# Patient Record
Sex: Female | Born: 1945 | Race: White | Hispanic: No | Marital: Married | Smoking: Former smoker
Health system: Southern US, Community
[De-identification: ages and names within clinical notes are randomized; demographics above are authoritative.]

## PROBLEM LIST (undated history)

## (undated) DIAGNOSIS — F32A Depression, unspecified: Secondary | ICD-10-CM

## (undated) DIAGNOSIS — I739 Peripheral vascular disease, unspecified: Secondary | ICD-10-CM

## (undated) DIAGNOSIS — T8859XA Other complications of anesthesia, initial encounter: Secondary | ICD-10-CM

## (undated) DIAGNOSIS — F419 Anxiety disorder, unspecified: Secondary | ICD-10-CM

## (undated) DIAGNOSIS — G971 Other reaction to spinal and lumbar puncture: Secondary | ICD-10-CM

## (undated) DIAGNOSIS — J45909 Unspecified asthma, uncomplicated: Secondary | ICD-10-CM

## (undated) DIAGNOSIS — R0602 Shortness of breath: Secondary | ICD-10-CM

## (undated) DIAGNOSIS — M47816 Spondylosis without myelopathy or radiculopathy, lumbar region: Secondary | ICD-10-CM

## (undated) DIAGNOSIS — N189 Chronic kidney disease, unspecified: Secondary | ICD-10-CM

## (undated) DIAGNOSIS — R059 Cough, unspecified: Secondary | ICD-10-CM

## (undated) DIAGNOSIS — M797 Fibromyalgia: Secondary | ICD-10-CM

## (undated) DIAGNOSIS — C801 Malignant (primary) neoplasm, unspecified: Secondary | ICD-10-CM

## (undated) DIAGNOSIS — D649 Anemia, unspecified: Secondary | ICD-10-CM

## (undated) DIAGNOSIS — T85192A Other mechanical complication of implanted electronic neurostimulator (electrode) of spinal cord, initial encounter: Secondary | ICD-10-CM

## (undated) DIAGNOSIS — J449 Chronic obstructive pulmonary disease, unspecified: Secondary | ICD-10-CM

## (undated) DIAGNOSIS — I82409 Acute embolism and thrombosis of unspecified deep veins of unspecified lower extremity: Secondary | ICD-10-CM

## (undated) DIAGNOSIS — I82402 Acute embolism and thrombosis of unspecified deep veins of left lower extremity: Secondary | ICD-10-CM

## (undated) DIAGNOSIS — F329 Major depressive disorder, single episode, unspecified: Secondary | ICD-10-CM

## (undated) DIAGNOSIS — R05 Cough: Secondary | ICD-10-CM

## (undated) DIAGNOSIS — I890 Lymphedema, not elsewhere classified: Secondary | ICD-10-CM

## (undated) DIAGNOSIS — K219 Gastro-esophageal reflux disease without esophagitis: Secondary | ICD-10-CM

## (undated) DIAGNOSIS — Z9889 Other specified postprocedural states: Secondary | ICD-10-CM

## (undated) DIAGNOSIS — J189 Pneumonia, unspecified organism: Secondary | ICD-10-CM

## (undated) DIAGNOSIS — R112 Nausea with vomiting, unspecified: Secondary | ICD-10-CM

## (undated) DIAGNOSIS — R7303 Prediabetes: Secondary | ICD-10-CM

## (undated) DIAGNOSIS — J383 Other diseases of vocal cords: Secondary | ICD-10-CM

## (undated) DIAGNOSIS — F319 Bipolar disorder, unspecified: Secondary | ICD-10-CM

## (undated) DIAGNOSIS — Z8489 Family history of other specified conditions: Secondary | ICD-10-CM

## (undated) DIAGNOSIS — R42 Dizziness and giddiness: Secondary | ICD-10-CM

## (undated) HISTORY — DX: Acute embolism and thrombosis of unspecified deep veins of left lower extremity: I82.402

## (undated) HISTORY — PX: APPENDECTOMY: SHX54

## (undated) HISTORY — PX: FINGER ARTHROSCOPY WITH CARPOMETACARPEL (CMC) ARTHROPLASTY: SHX5629

## (undated) HISTORY — PX: WISDOM TOOTH EXTRACTION: SHX21

## (undated) HISTORY — PX: SHOULDER ARTHROSCOPY: SHX128

## (undated) HISTORY — PX: DENTAL RESTORATION/EXTRACTION WITH X-RAY: SHX5796

## (undated) HISTORY — DX: Gastro-esophageal reflux disease without esophagitis: K21.9

## (undated) HISTORY — DX: Fibromyalgia: M79.7

## (undated) HISTORY — DX: Cough: R05

## (undated) HISTORY — PX: BACK SURGERY: SHX140

## (undated) HISTORY — DX: Cough, unspecified: R05.9

## (undated) HISTORY — DX: Spondylosis without myelopathy or radiculopathy, lumbar region: M47.816

## (undated) HISTORY — DX: Bipolar disorder, unspecified: F31.9

## (undated) HISTORY — PX: TARSAL SUSPENSION: SHX2483

## (undated) HISTORY — PX: OTHER SURGICAL HISTORY: SHX169

## (undated) HISTORY — PX: COLONOSCOPY W/ BIOPSIES AND POLYPECTOMY: SHX1376

## (undated) HISTORY — PX: UPPER GI ENDOSCOPY: SHX6162

## (undated) HISTORY — PX: CATARACT EXTRACTION W/ INTRAOCULAR LENS  IMPLANT, BILATERAL: SHX1307

## (undated) HISTORY — PX: BLEPHAROPLASTY: SUR158

---
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
## 1978-02-08 HISTORY — PX: ABDOMINAL HYSTERECTOMY: SHX81

## 1978-02-08 HISTORY — PX: PARTIAL HYSTERECTOMY: SHX80

## 1987-02-09 HISTORY — PX: BREAST ENHANCEMENT SURGERY: SHX7

## 1987-02-09 HISTORY — PX: CHOLECYSTECTOMY: SHX55

## 1997-06-10 ENCOUNTER — Other Ambulatory Visit: Admission: RE | Admit: 1997-06-10 | Discharge: 1997-06-10 | Payer: Self-pay | Admitting: Obstetrics and Gynecology

## 1998-02-27 ENCOUNTER — Encounter: Payer: Self-pay | Admitting: Emergency Medicine

## 1998-08-27 ENCOUNTER — Encounter: Admission: RE | Admit: 1998-08-27 | Discharge: 1998-09-08 | Payer: Self-pay | Admitting: Anesthesiology

## 1998-09-10 ENCOUNTER — Encounter: Admission: RE | Admit: 1998-09-10 | Discharge: 1998-12-09 | Payer: Self-pay | Admitting: Anesthesiology

## 1999-06-02 ENCOUNTER — Other Ambulatory Visit: Admission: RE | Admit: 1999-06-02 | Discharge: 1999-06-02 | Payer: Self-pay | Admitting: Obstetrics and Gynecology

## 1999-08-05 ENCOUNTER — Encounter: Admission: RE | Admit: 1999-08-05 | Discharge: 1999-11-03 | Payer: Self-pay | Admitting: Anesthesiology

## 2000-05-12 ENCOUNTER — Encounter: Admission: RE | Admit: 2000-05-12 | Discharge: 2000-05-12 | Payer: Self-pay | Admitting: Orthopedic Surgery

## 2000-05-12 ENCOUNTER — Encounter: Payer: Self-pay | Admitting: Orthopedic Surgery

## 2000-08-18 ENCOUNTER — Encounter: Admission: RE | Admit: 2000-08-18 | Discharge: 2000-10-08 | Payer: Self-pay | Admitting: Anesthesiology

## 2000-08-22 ENCOUNTER — Other Ambulatory Visit: Admission: RE | Admit: 2000-08-22 | Discharge: 2000-08-22 | Payer: Self-pay | Admitting: Obstetrics and Gynecology

## 2000-11-08 ENCOUNTER — Encounter: Payer: Self-pay | Admitting: Neurosurgery

## 2000-11-08 ENCOUNTER — Inpatient Hospital Stay (HOSPITAL_COMMUNITY): Admission: RE | Admit: 2000-11-08 | Discharge: 2000-11-08 | Payer: Self-pay | Admitting: Neurosurgery

## 2001-07-31 ENCOUNTER — Ambulatory Visit (HOSPITAL_COMMUNITY): Admission: RE | Admit: 2001-07-31 | Discharge: 2001-07-31 | Payer: Self-pay | Admitting: Gastroenterology

## 2002-11-08 ENCOUNTER — Encounter: Payer: Self-pay | Admitting: Orthopedic Surgery

## 2002-11-08 ENCOUNTER — Encounter: Admission: RE | Admit: 2002-11-08 | Discharge: 2002-11-08 | Payer: Self-pay | Admitting: Orthopedic Surgery

## 2003-02-04 ENCOUNTER — Encounter: Admission: RE | Admit: 2003-02-04 | Discharge: 2003-02-04 | Payer: Self-pay | Admitting: Cardiology

## 2003-02-09 HISTORY — PX: CERVICAL FUSION: SHX112

## 2003-02-15 ENCOUNTER — Encounter: Admission: RE | Admit: 2003-02-15 | Discharge: 2003-02-15 | Payer: Self-pay | Admitting: Neurosurgery

## 2003-03-18 ENCOUNTER — Inpatient Hospital Stay (HOSPITAL_COMMUNITY): Admission: AD | Admit: 2003-03-18 | Discharge: 2003-03-19 | Payer: Self-pay | Admitting: Neurosurgery

## 2003-05-23 ENCOUNTER — Encounter: Admission: RE | Admit: 2003-05-23 | Discharge: 2003-05-23 | Payer: Self-pay | Admitting: Neurosurgery

## 2004-02-09 HISTORY — PX: CARPAL TUNNEL RELEASE: SHX101

## 2006-06-13 ENCOUNTER — Encounter: Admission: RE | Admit: 2006-06-13 | Discharge: 2006-06-13 | Payer: Self-pay | Admitting: Orthopedic Surgery

## 2007-11-27 ENCOUNTER — Ambulatory Visit (HOSPITAL_COMMUNITY): Admission: RE | Admit: 2007-11-27 | Discharge: 2007-11-27 | Payer: Self-pay | Admitting: Anesthesiology

## 2008-01-20 ENCOUNTER — Encounter: Admission: RE | Admit: 2008-01-20 | Discharge: 2008-01-20 | Payer: Self-pay | Admitting: Neurosurgery

## 2008-02-09 DIAGNOSIS — I82409 Acute embolism and thrombosis of unspecified deep veins of unspecified lower extremity: Secondary | ICD-10-CM

## 2008-02-09 DIAGNOSIS — I82402 Acute embolism and thrombosis of unspecified deep veins of left lower extremity: Secondary | ICD-10-CM

## 2008-02-09 HISTORY — DX: Acute embolism and thrombosis of unspecified deep veins of left lower extremity: I82.402

## 2008-02-09 HISTORY — DX: Acute embolism and thrombosis of unspecified deep veins of unspecified lower extremity: I82.409

## 2008-02-09 HISTORY — PX: SPINAL CORD STIMULATOR IMPLANT: SHX2422

## 2008-06-14 ENCOUNTER — Ambulatory Visit: Payer: Self-pay | Admitting: Vascular Surgery

## 2008-07-04 ENCOUNTER — Ambulatory Visit: Payer: Self-pay | Admitting: Diagnostic Radiology

## 2008-07-04 ENCOUNTER — Emergency Department (HOSPITAL_BASED_OUTPATIENT_CLINIC_OR_DEPARTMENT_OTHER): Admission: EM | Admit: 2008-07-04 | Discharge: 2008-07-04 | Payer: Self-pay | Admitting: Emergency Medicine

## 2008-10-01 ENCOUNTER — Encounter: Admission: RE | Admit: 2008-10-01 | Discharge: 2008-10-01 | Payer: Self-pay | Admitting: Gastroenterology

## 2009-07-03 ENCOUNTER — Encounter: Payer: Self-pay | Admitting: Emergency Medicine

## 2009-07-11 ENCOUNTER — Encounter: Admission: RE | Admit: 2009-07-11 | Discharge: 2009-07-11 | Payer: Self-pay | Admitting: Neurology

## 2009-07-15 ENCOUNTER — Encounter: Payer: Self-pay | Admitting: Emergency Medicine

## 2009-07-18 ENCOUNTER — Encounter: Admission: RE | Admit: 2009-07-18 | Discharge: 2009-07-18 | Payer: Self-pay | Admitting: Neurology

## 2009-07-28 DIAGNOSIS — G43909 Migraine, unspecified, not intractable, without status migrainosus: Secondary | ICD-10-CM

## 2009-07-28 DIAGNOSIS — J383 Other diseases of vocal cords: Secondary | ICD-10-CM

## 2009-07-28 DIAGNOSIS — IMO0001 Reserved for inherently not codable concepts without codable children: Secondary | ICD-10-CM

## 2009-07-28 DIAGNOSIS — F319 Bipolar disorder, unspecified: Secondary | ICD-10-CM | POA: Insufficient documentation

## 2009-07-28 DIAGNOSIS — K219 Gastro-esophageal reflux disease without esophagitis: Secondary | ICD-10-CM | POA: Insufficient documentation

## 2009-07-29 ENCOUNTER — Ambulatory Visit: Payer: Self-pay | Admitting: Emergency Medicine

## 2009-07-29 DIAGNOSIS — J309 Allergic rhinitis, unspecified: Secondary | ICD-10-CM

## 2009-07-30 ENCOUNTER — Telehealth (INDEPENDENT_AMBULATORY_CARE_PROVIDER_SITE_OTHER): Payer: Self-pay | Admitting: *Deleted

## 2009-08-09 ENCOUNTER — Emergency Department (HOSPITAL_COMMUNITY): Admission: EM | Admit: 2009-08-09 | Discharge: 2009-08-09 | Payer: Self-pay | Admitting: Emergency Medicine

## 2009-08-26 ENCOUNTER — Ambulatory Visit: Payer: Self-pay | Admitting: Emergency Medicine

## 2009-08-26 ENCOUNTER — Telehealth: Payer: Self-pay | Admitting: Emergency Medicine

## 2009-08-28 ENCOUNTER — Encounter: Payer: Self-pay | Admitting: Emergency Medicine

## 2009-09-03 ENCOUNTER — Telehealth: Payer: Self-pay | Admitting: Emergency Medicine

## 2009-09-10 ENCOUNTER — Encounter: Admission: RE | Admit: 2009-09-10 | Discharge: 2009-11-04 | Payer: Self-pay | Admitting: Neurology

## 2009-09-30 ENCOUNTER — Ambulatory Visit: Payer: Self-pay | Admitting: Internal Medicine

## 2009-09-30 DIAGNOSIS — R930 Abnormal findings on diagnostic imaging of skull and head, not elsewhere classified: Secondary | ICD-10-CM

## 2009-10-01 ENCOUNTER — Encounter: Admission: RE | Admit: 2009-10-01 | Discharge: 2009-10-01 | Payer: Self-pay | Admitting: Orthopedic Surgery

## 2009-10-06 ENCOUNTER — Ambulatory Visit: Payer: Self-pay | Admitting: Cardiology

## 2009-10-06 LAB — CONVERTED CEMR LAB
Basophils Absolute: 0.1 10*3/uL (ref 0.0–0.1)
Basophils Relative: 0.9 % (ref 0.0–3.0)
Calcium: 8.6 mg/dL (ref 8.4–10.5)
Eosinophils Relative: 1 % (ref 0.0–5.0)
GFR calc non Af Amer: 58.92 mL/min (ref >60)
Hemoglobin: 12.9 g/dL (ref 12.0–15.0)
Lymphocytes Relative: 21.8 % (ref 12.0–46.0)
Monocytes Relative: 6.3 % (ref 3.0–12.0)
Neutro Abs: 10.4 10*3/uL — ABNORMAL HIGH (ref 1.4–7.7)
RBC: 4.26 M/uL (ref 3.87–5.11)
RDW: 13.9 % (ref 11.5–14.6)
Sodium: 145 meq/L (ref 135–145)
WBC: 14.9 10*3/uL — ABNORMAL HIGH (ref 4.5–10.5)

## 2009-10-07 ENCOUNTER — Ambulatory Visit: Payer: Self-pay | Admitting: Emergency Medicine

## 2009-10-07 DIAGNOSIS — R911 Solitary pulmonary nodule: Secondary | ICD-10-CM | POA: Insufficient documentation

## 2009-10-09 ENCOUNTER — Telehealth: Payer: Self-pay | Admitting: Internal Medicine

## 2009-10-14 ENCOUNTER — Ambulatory Visit
Admission: RE | Admit: 2009-10-14 | Discharge: 2009-10-14 | Payer: Self-pay | Source: Home / Self Care | Admitting: Emergency Medicine

## 2009-10-14 ENCOUNTER — Ambulatory Visit: Payer: Self-pay | Admitting: Emergency Medicine

## 2009-10-27 ENCOUNTER — Emergency Department (HOSPITAL_BASED_OUTPATIENT_CLINIC_OR_DEPARTMENT_OTHER)
Admission: EM | Admit: 2009-10-27 | Discharge: 2009-10-27 | Payer: Self-pay | Source: Home / Self Care | Admitting: Emergency Medicine

## 2009-10-27 ENCOUNTER — Ambulatory Visit: Payer: Self-pay | Admitting: Diagnostic Radiology

## 2009-10-30 ENCOUNTER — Ambulatory Visit: Payer: Self-pay | Admitting: Internal Medicine

## 2009-11-01 LAB — CONVERTED CEMR LAB
BUN: 14 mg/dL (ref 6–23)
Basophils Relative: 0.4 % (ref 0.0–3.0)
CO2: 27 meq/L (ref 19–32)
Chloride: 106 meq/L (ref 96–112)
Creatinine, Ser: 0.9 mg/dL (ref 0.4–1.2)
Eosinophils Absolute: 0.3 10*3/uL (ref 0.0–0.7)
Hemoglobin: 12.3 g/dL (ref 12.0–15.0)
Lymphocytes Relative: 24.2 % (ref 12.0–46.0)
MCHC: 34.3 g/dL (ref 30.0–36.0)
Neutro Abs: 5.1 10*3/uL (ref 1.4–7.7)
RBC: 4.08 M/uL (ref 3.87–5.11)

## 2009-11-04 ENCOUNTER — Ambulatory Visit: Payer: Self-pay | Admitting: Emergency Medicine

## 2009-11-05 ENCOUNTER — Encounter: Admission: RE | Admit: 2009-11-05 | Discharge: 2009-11-05 | Payer: Self-pay | Admitting: Orthopedic Surgery

## 2009-11-05 ENCOUNTER — Telehealth (INDEPENDENT_AMBULATORY_CARE_PROVIDER_SITE_OTHER): Payer: Self-pay | Admitting: *Deleted

## 2009-11-14 ENCOUNTER — Telehealth (INDEPENDENT_AMBULATORY_CARE_PROVIDER_SITE_OTHER): Payer: Self-pay | Admitting: *Deleted

## 2009-11-16 ENCOUNTER — Encounter: Payer: Self-pay | Admitting: Emergency Medicine

## 2009-11-18 ENCOUNTER — Encounter: Payer: Self-pay | Admitting: Emergency Medicine

## 2009-11-19 ENCOUNTER — Encounter: Payer: Self-pay | Admitting: Emergency Medicine

## 2009-12-03 ENCOUNTER — Ambulatory Visit: Payer: Self-pay | Admitting: Emergency Medicine

## 2009-12-08 ENCOUNTER — Telehealth (INDEPENDENT_AMBULATORY_CARE_PROVIDER_SITE_OTHER): Payer: Self-pay | Admitting: *Deleted

## 2009-12-09 ENCOUNTER — Encounter: Admission: RE | Admit: 2009-12-09 | Discharge: 2009-12-09 | Payer: Self-pay | Admitting: Neurosurgery

## 2009-12-17 ENCOUNTER — Encounter: Payer: Self-pay | Admitting: Emergency Medicine

## 2009-12-17 ENCOUNTER — Ambulatory Visit: Payer: Self-pay | Admitting: Emergency Medicine

## 2009-12-25 ENCOUNTER — Ambulatory Visit: Payer: Self-pay | Admitting: Emergency Medicine

## 2010-01-21 ENCOUNTER — Ambulatory Visit (HOSPITAL_COMMUNITY)
Admission: RE | Admit: 2010-01-21 | Discharge: 2010-01-21 | Payer: Self-pay | Source: Home / Self Care | Attending: Neurosurgery | Admitting: Neurosurgery

## 2010-01-30 ENCOUNTER — Telehealth: Payer: Self-pay | Admitting: Internal Medicine

## 2010-02-03 ENCOUNTER — Inpatient Hospital Stay (HOSPITAL_COMMUNITY)
Admission: RE | Admit: 2010-02-03 | Discharge: 2010-02-05 | Payer: Self-pay | Source: Home / Self Care | Attending: Neurosurgery | Admitting: Neurosurgery

## 2010-03-05 ENCOUNTER — Other Ambulatory Visit: Payer: Self-pay | Admitting: Neurosurgery

## 2010-03-05 DIAGNOSIS — Z09 Encounter for follow-up examination after completed treatment for conditions other than malignant neoplasm: Secondary | ICD-10-CM

## 2010-03-10 NOTE — Miscellaneous (Signed)
Summary: Orders Update pft charges  Clinical Lists Changes  Orders: Added new Service order of Carbon Monoxide diffusing w/capacity (94720) - Signed Added new Service order of Lung Volumes (94240) - Signed Added new Service order of Spirometry (Pre & Post) (94060) - Signed 

## 2010-03-10 NOTE — Assessment & Plan Note (Signed)
Summary: cough, allergies, GERD   Visit Type:  Follow-up Copy to:  Dr. Stevphen Rochester Primary Provider/Referring Provider:  Dr. Joycelyn Rua  CC:  The patient is here to discuss PFT results...she says her cough has returned.  History of Present Illness: 65 yo former smoker, hx documented allergies and rhinitis, chronic cough. Has seen Dr Corinda Gubler regarding allergies and the cough. For the last month the cough has been much worse, happens with talking and exertion. She has been empirically treated with by mouth steroids and also started on inhaled LABA/ICS. Skin testing has shown no sensitivity to cats/dogs although she has 30 animals in her home. Spirometry done 07/03/09 showed normal airflows. She has also been treated for GERD with Nexium + prilosec.   ROV 11/04/09 -- returns for cough. Her HSP panel is negative for fungal sensitivity, IgE and eosinophils normal. Her cough persists, may be a bit better. Able to carry on more conversation, talking does make it worse. Dr Maple Hudson rightly raises possibility of auto-immune process, even BAC.   ROV 12/03/09 -- chronic cough, chronic rhinitis, normal AF on spirometry. She was admitted 10/9-10/12 to [facility] with fever, lightheadedness, dyspnea, cough was actually better. Treated for ? COPD exac or PNA. CT scan without infiltrates (per d/c summary). Cleda Daub 5/11 normal. She believes that the [facility] helps her. Her cough is better. 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   ROV 12/25/09 -- chronic cough, rhinitis, abnormal CT scan (? mild nodular infiltrate, 09/2009). Repeat scan done at [facility] in 10/11, need to review and compare. Initial plan has been to repeat scan in Dec. Has had PFT since last visit, here to review. 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 Her exposure profile at home is very high - dusts, litter etc. She is on [place], sometimes coughs after it. She believes that the [facility] is  helping her breathing.   Current Medications (verified): 1)  Budeprion Sr 150 Mg Xr12h-Tab (Bupropion Hcl) .Marland Kitchen.. 1 By Mouth Three Times A Day 2)  Ritalin 20 Mg Tabs (Methylphenidate Hcl) .Marland Kitchen.. 1 By Mouth Two Times A Day 3)  Furosemide 40 Mg Tabs (Furosemide) .Marland Kitchen.. 1 By Mouth Three Times A Day 4)  Nexium 40 Mg Cpdr (Esomeprazole Magnesium) .Marland Kitchen.. 1 By Mouth Every Morning 5)  Prilosec Otc 20 Mg Tbec (Omeprazole Magnesium) .Marland Kitchen.. 1 By Mouth At Night 6)  Multivitamins  Tabs (Multiple Vitamin) .Marland Kitchen.. 1 By Mouth Daily 7)  Potassium Gluconate 595 Mg Tabs (Potassium Gluconate) .Marland Kitchen.. 1 By Mouth Two Times A Day 8)  Lovastatin 40 Mg Tabs (Lovastatin) .... Take 1 By Mouth Once Daily 9)  Cyclobenzaprine Hcl 10 Mg Tabs (Cyclobenzaprine Hcl) .... 2 By Mouth At Bedtime 10)  Lamictal 100 Mg Tabs (Lamotrigine) .Marland Kitchen.. 1 By Mouth At Bedtime 11)  Alprazolam 1 Mg Tabs (Alprazolam) .... 2 By Mouth At Bedtime;take 1 As Needed During Day 12)  Mirapex 0.5 Mg Tabs (Pramipexole Dihydrochloride) .Marland Kitchen.. 1 By Mouth At Bedtime 13)  Lyrica 100 Mg Caps (Pregabalin) .Marland Kitchen.. 1 By Mouth At Bedtime 14)  Ibuprofen 200 Mg Caps (Ibuprofen) .... As Needed 15)  Fioricet 50-325-40 Mg Tabs (Butalbital-Apap-Caffeine) .... 2 By Mouth As Needed For Migraines 16)  Antihistamine Decongestant 2.5-60 Mg Tabs (Triprolidine-Pseudoephedrine) .... As Needed 17)  Melatonin 300 Mcg Tabs (Melatonin) .Marland Kitchen.. 1 By Mouth At Bedtime 18)  Zonisamide 100 Mg Caps (Zonisamide) .... 2 By Mouth As Directed 19)  Hydrocodone-Acetaminophen 5-325 Mg  Tabs (Hydrocodone-Acetaminophen) .... One Tablet Every 12 Hours 20)  Promethazine Hcl 25 Mg Tabs (Promethazine Hcl) .... As Needed 21)  Loratadine 10 Mg Tabs (Loratadine) .Marland Kitchen.. 1 By Mouth Once Daily 22)  Fluticasone Propionate 50 Mcg/act Susp (Fluticasone Propionate) .... 2 Sprays Each Nostril Two Times A Day 23)  Dulera 100-5 Mcg/act Aero (Mometasone Furo-Formoterol Fum) .... 2 Puffs Two Times A Day 24)  Vitamin D 1000 Unit Tabs  (Cholecalciferol) .Marland Kitchen.. 1 By Mouth Daily 25)  Calcium 600 Mg Tabs (Calcium) .... 2 By Mouth Daily 26)  Delsym 30 Mg/60ml Lqcr (Dextromethorphan Polistirex) .... As Directed 27)  Coq10 100 Mg Caps (Coenzyme Q10) .... Once Daily 28)  Abilify 10 Mg Tabs (Aripiprazole) .... Once Daily 29)  Bepreve 1.5 % Soln (Bepotastine Besilate) .... As Directed Daily 30)  Docusate Calcium 240 Mg Caps (Docusate Calcium) .... Take 1 By Mouth Weekly 31)  Sinus Rinse Kit  Pack (Hypertonic Nasal Wash) .Marland Kitchen.. 1 Time Daily 32)  Fluorometholone 0.1 % Susp (Fluorometholone) .Marland Kitchen.. 1 Drop in Each Eye Two Times A Day As Needed 33)  Pennsaid 1.5 % Soln (Diclofenac Sodium) .Marland Kitchen.. 1 Time A Day 34)  Proair Hfa 108 (90 Base) Mcg/act Aers (Albuterol Sulfate) .... 2 Puffs Every 4 Hours As Needed 35)  Docusate Sodium 100 Mg Tabs (Docusate Sodium) .... 2 Capsules Daily 36)  Meclizine Hcl 25 Mg Tabs (Meclizine Hcl) .... As Needed  Allergies (verified): 1)  ! * Lithium  Vital Signs:  Patient profile:   65 year old female Height:      64 inches (162.56 cm) Weight:      232 pounds (105.45 kg) BMI:     39.97 O2 Sat:      98 % on Room air Temp:     98.0 degrees F (36.67 degrees C) oral Pulse rate:   95 / minute BP sitting:   130 / 72  (left arm) Cuff size:   large  Vitals Entered By: Michel Bickers CMA (December 25, 2009 11:46 AM)  O2 Sat at Rest %:  98 O2 Flow:  Room air CC: The patient is here to discuss PFT results...she says her cough has returned Comments Medications reviewed with patient Michel Bickers [facility]  December 25, 2009 11:47 AM   Physical Exam  General:  normal appearance, healthy appearing, and obese. Cough is much better Head:  normocephalic and atraumatic Eyes:  conjunctiva and sclera clear Nose:  no deformity, discharge, inflammation, or lesions Mouth:  posterior pharyngeal erythema Neck:  no masses, thyromegaly, or abnormal cervical nodes Lungs:  clear bilaterally to auscultation and percussion Heart:  regular  rate and rhythm, S1, S2 without murmurs, rubs, gallops, or clicks Abdomen:  not examined Extremities:  1 + LE edema Neurologic:  non-focal Skin:  intact without lesions or rashes Psych:  alert and cooperative; normal mood and affect; normal attention span and concentration   CT of Chest  Procedure date:  11/18/2009  Findings:      Shows some persistent RLL mild peribronchial inflammatory change but in DIFFERENT distribution compared with our CT done 8/29/1.    Impression & Recommendations:  Problem # 1:  COUGH (ICD-786.2)  Not clear that she actually has asthma, her spiro and PFt may suggest mild AFL but soft call.  - continure NSW, loratadine, fluticasone - stay on [facility] for now.   Orders: Est. Patient Level IV (16109)  Problem # 2:  ABNORMAL CHEST XRAY (ICD-793.1) CT from [facility] 10/11 shows possible continued mild RLL inflammatory  changes but in doifferent distribution compared with our scan from 09/2009. This rules out BAC or other malignancy. While [facility] or other inflammatory process remain a possibility, this seems unlikely given th erest of her clinical picture. FOB was negative. Will plan to follow clinically and decide regarding further imaging based on ststus.   Orders: Est. Patient Level IV (56213)  Problem # 3:  G E R D (ICD-530.81)  Her updated medication list for this problem includes:    Nexium 40 Mg Cpdr (Esomeprazole magnesium) .Marland Kitchen... 1 by mouth every morning    Prilosec Otc 20 Mg Tbec (Omeprazole magnesium) .Marland Kitchen... 1 by mouth at night  Patient Instructions: 1)  Please continue your current medications 2)  We will cancel your CT scan scheduled for December

## 2010-03-10 NOTE — Assessment & Plan Note (Signed)
Summary: allergy consult/mhh   Vital Signs:  Patient profile:   65 year old female Height:      64 inches Weight:      222.13 pounds BMI:     38.27 O2 Sat:      96 % on Room air Pulse rate:   78 / minute BP sitting:   138 / 72  (left arm) Cuff size:   regular  Vitals Entered By: Reynaldo Minium CMA (September 30, 2009 4:06 PM)  O2 Flow:  Room air   Copy to:  Dr. Stevphen Rochester Primary Provider/Referring Provider:  Dr. Joycelyn Rua   History of Present Illness: Hx from Dr Delton Coombes: 65 yo former smoker, hx documented allergies and rhinitis, chronic cough. Has seen Dr Corinda Gubler regarding allergies and the cough. For the last month the cough has been much worse, happens with talking and exertion. She has been empirically treated with by mouth steroids and also started on inhaled LABA/ICS. Skin testing has shown no sensitivity to cats/dogs although she has 30 animals in her home. Spirometry done 07/03/09 showed normal airflows. She has also been treated for GERD with Nexium + prilosec. She is not on any antihistamines or nasal sprays.   ROV 08/26/09 -- returns to f/u We started NSW, nasal steroid, loratadine. She is on Nexium once daily. She still has animals, 13 litter boxes! She is not allergic on skin testing to cats, but she is very sensitive to dusts and molds. Cough may be better but still very limiting. Still with PND even on good regimen .........Marland Kitchen September 30, 2009- 64 yoF referred by Dr Delton Coombes for evaluation of cough. Notes reviewed. Cough began 5 years ago after repairing kichen floor and ceiling tile likely to have asbestos. 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 Quit smoking in 1988 and earlier cough cleared then- had been up to 2.5 PPD. Mother died of lung cancer. Patient concerned about possibility of cancer. Has GERD treated with endoscopy by Dr Loreta Ave. Dr Delton Coombes took her off inhalers that weren't helping. She thinks steroid inhaler did help. Cough feels as if it starts with  diaphragm.  She was seen by Dr Corinda Gubler with no allergy f/u. He told her he didn't think she needed allergy shots, aftrer skin testing positive mainly for dust and mold, not animals. She "rescues" animals and has 50 animals with 13 liter boxes which she is not prepared to give up. Dr Dohmeier eval for ? Myasthenia Gravis. Denies choke or strangle with eating. Takes sedating meds at night that would prevent her from noting if she refluxes at night. Speech therapist suggested Modified Barium Swallow.  Doesn't notice much postnasal drip.    Preventive Screening-Counseling & Management  Alcohol-Tobacco     Alcohol drinks/day: 0     Smoking Status: quit     Year Quit: 1988     Pack years: 21 years x 2 1/2 ppd     Tobacco Counseling: not indicated; no tobacco use  Allergies (verified): 1)  ! * Lithium  Past History:  Family History: Last updated: 07/29/2009 Family History Lung Cancer---mother Family History Prostate Cancer---father Rheumatism---PGM Seasonal allergies---sister Family History Asthma---sister Heart disease---father  Social History: Last updated: 09/30/2009 Retired Runner, broadcasting/film/video Lives with friend/partner Karen Kays Lives with 9 dogs and 21 cats in the home- rescues animals Recovering alcoholic- sober since 1989  Risk Factors: Alcohol Use: 0 (09/30/2009)  Risk Factors: Smoking Status: quit (09/30/2009)  Past Medical History: COUGH (ICD-786.2) MIGRAINE HEADACHE (ICD-346.90) FIBROMYALGIA (ICD-729.1) BIPOLAR AFFECTIVE  DISORDER (ICD-296.80) G E R D (ICD-530.81) DVT left leg 2010 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  Past Surgical History: Cholecystectomy in 1989 Tonsillectomy in 1955 Partial Hysterectomy in 1980 Cosmetic breast surgery in 1989 fusion of c5-7 in 2005 Carpal Tunnel Release on right hand in 2006 Spinal stimulator in 2010- Lipoma right arm  Social History: Retired Runner, 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  The patient denies shortness of breath at rest, non-productive cough, coughing up blood, indigestion, loss of appetite, abdominal pain, sneezing, rash, change in color of mucus, and fever.    Physical Exam  Additional Exam:  General: A/Ox3; pleasant and cooperative, NAD, SKIN: no rash, lesions NODES: no lymphadenopathy HEENT: [place]/AT, EOM- WNL, Conjuctivae- clear, PERRLA, TM-WNL, Nose- clear, Throat- clear and wnl, not red,  Mallampati  III NECK: Supple w/ fair ROM, JVD- none, normal carotid impulses w/o bruits Thyroid- normal to palpation. Scars at neck from cervical spine fusions CHEST: Clear to P&A, but harsh wheezey cough with deep breath and speech. Pursed lips helps her control cough. HEART: RRR, no m/g/r heard ABDOMEN: Soft and nl; nml bowel sounds; no organomegaly or masses noted. Overweight ZOX:WRUE, nl pulses, no edema  NEURO: Grossly intact to observation      Impression & Recommendations:  Problem # 1:  COUGH (ICD-786.2)  Former heavy smoker with chronic cough. We will ask CT for possible bronchiectasis. Doubt important asbestos exposure- she brought it up. We will get allergy profile for IgE evaluation. Elevated IgE might offer trial of Xolair. She is willing to try an inhaler again since she thinks they were helping in retrospect. I will try her with [facility]. Consider if her CSpine surgery might have injured phrenic or recurrent laryngeal nerve to contribute to this. I don't know if MBS might help, or  barium esophagram.  Problem # 2:  ALLERGIC RHINITIS (ICD-477.9) In spite of her massive animal exposure, she didn't skin test positive for much. I will see what her in vitro IgE assessmentr looks like. She isn't aware of location or seasonal differences, or of postnasal drip. Her updated medication list for this problem includes:    Promethazine Hcl 25 Mg Tabs (Promethazine hcl) .Marland Kitchen... As needed    Loratadine 10 Mg Tabs (Loratadine) .Marland Kitchen... 1 by mouth once daily    Fluticasone Propionate 50 Mcg/act Susp (Fluticasone propionate) .Marland Kitchen... 2 sprays each nostril two times a day  Medications Added to Medication List This Visit: 1)  Budeprion Sr 150 Mg Xr12h-tab (Bupropion hcl) .Marland Kitchen.. 1 by mouth three times a day 2)  Lovastatin 40 Mg Tabs (Lovastatin) .... Take 1 by mouth once daily 3)  Alprazolam 1 Mg Tabs (Alprazolam) .... 2 by mouth at bedtime;take 1 as needed during day  Other Orders: Consultation Level IV (99244) TLB-BMP (Basic Metabolic Panel-BMET) (80048-METABOL) T-Allergy Profile Region II-[place], [place], [place], , [place] (5484) TLB-CBC Platelet - w/Differential (85025-CBCD) Radiology Referral (Radiology)  Patient Instructions: 1)  Please schedule a follow-up appointment in 1 month. 2)  A Chest CT with Contrast has been recommended.  Your imaging  study may require preauthorization.  3)  Lab 4)  Sample trial Dulera 200/5, 2 puffs and rinse mouth, twice daily 5)  CC Dr Delton Coombes

## 2010-03-10 NOTE — Progress Notes (Signed)
Summary: med for nausea  Phone Note Call from Patient   Caller: Patient--[phone] Call For: byrum Reason for Call: Refill Medication Summary of Call: Patient was just seen and given rx for cough.  Medicine has coedine in it.  Patient says she will need something for nausea.  [facility] Initial call taken by: Lehman Prom,  August 26, 2009 12:31 PM  Follow-up for Phone Call        RB---pt is requesting something for nausea to take with the cough meds--please advise.  thanks Randell Loop CMA  August 26, 2009 12:41 PM   benzonatate 100mg  for nausea.  Pt has taken this with codeine before.  Goes real well with the codeine.  Could you call this in for nausea?  [facility] [facility]? Follow-up by: Eugene Gavia,  August 26, 2009 3:45 PM  Additional Follow-up for Phone Call Additional follow up Details #1::        RB  please advise of what meds to call in for nausea. thanks Randell Loop CMA  August 26, 2009 3:51 PM   pt called back...still has had no response.  Please call her and let her know if you are going to allow anti nausea med.  The syrup makes her nauseas.   Additional Follow-up by: Eugene Gavia,  August 28, 2009 8:39 AM    Additional Follow-up for Phone Call Additional follow up Details #2::    OK to give benzonatate 100mg  by mouth three times a day as needed for cough, #30 Leslye Peer MD  August 28, 2009 4:56 PM   New/Updated Medications: BENZONATATE 100 MG CAPS (BENZONATATE) take one tablet by mouth three times a day as needed for cough Prescriptions: BENZONATATE 100 MG CAPS (BENZONATATE) take one tablet by mouth three times a day as needed for cough  #30 x 0   Entered by:   Randell Loop CMA   Authorized by:   Leslye Peer MD   Signed by:   Randell Loop CMA on 08/28/2009   Method used:   [facility]        [facility]  [address] [phone]ail)       [address] [place], [place]  [address]       Ph: [phone] or  [phone]       Fax: [phone]   RxID:   [id number]  [facility] ---[facility] and [facility] [place]-- Randell Loop CMA  August 28, 2009 5:04 PM

## 2010-03-10 NOTE — Assessment & Plan Note (Signed)
Summary: cough   Visit Type:  Follow-up Copy to:  Dr. Stevphen Rochester Primary Provider/Referring Provider:  Dr. Joycelyn Rua  CC:  Cough.  The patient says there is no change in her cough...prod with white to yellow mucus...Dr. Maple Hudson sent patient for CT chest on 10/06/2009.[name]  History of Present Illness: 65 yo former smoker, hx documented allergies and rhinitis, chronic cough. Has seen Dr Corinda Gubler regarding allergies and the cough. For the last month the cough has been much worse, happens with talking and exertion. She has been empirically treated with by mouth steroids and also started on inhaled LABA/ICS. Skin testing has shown no sensitivity to cats/dogs although she has 30 animals in her home. Spirometry done 07/03/09 showed normal airflows. She has also been treated for GERD with Nexium + prilosec. She is not on any antihistamines or nasal sprays.   ROV 08/26/09 -- returns to f/u We started [place], nasal steroid, loratadine. She is on Nexium once daily. She still has animals, 13 litter boxes! She is not allergic on skin testing to cats, but she is very sensitive to dusts and molds. Cough may be better but still very limiting. Still with PND even on good regimen  ROV 10/07/09 -- returns for chronic cough and UA symptoms. Referred her to see Dr Maple Hudson for allergy re-eval in setting huge animal exposure at home. Her skin testing was not very impressive, confirmed prob no role for immunotherapy. Dr Maple Hudson ordered CT scan, showed some subtle peri-bronchovascular infiltrate as below, also showed a nodule that will need to be followed. Retried her on LABA/ICS Elwin Sleight) to see if she'd benefit - she has noticed some benefit. Still with hoarse voice, frequent cough. Dr Maple Hudson raises question of phrenic nerve involvement, which is certainly a possibility. Has been off prednisone x 10 days. GERD symptoms "come and go".   Current Medications (verified): 1)  Budeprion Sr 150 Mg Xr12h-Tab (Bupropion Hcl) .[name].. 1 By Mouth  Three Times A Day 2)  Ritalin 20 Mg Tabs (Methylphenidate Hcl) .[name].. 1 By Mouth Two Times A Day 3)  Furosemide 40 Mg Tabs (Furosemide) .[name].. 1 By Mouth Three Times A Day 4)  Nexium 40 Mg Cpdr (Esomeprazole Magnesium) .[name].. 1 By Mouth Every Morning 5)  Prilosec Otc 20 Mg Tbec (Omeprazole Magnesium) .[name].. 1 By Mouth At Night 6)  Multivitamins  Tabs (Multiple Vitamin) .[name].. 1 By Mouth Daily 7)  Potassium Gluconate 595 Mg Tabs (Potassium Gluconate) .[name].. 1 By Mouth Two Times A Day 8)  Docusate Sodium 100 Mg Caps (Docusate Sodium) .... 2 By Mouth Daily 9)  Lovastatin 40 Mg Tabs (Lovastatin) .... Take 1 By Mouth Once Daily 10)  Cyclobenzaprine Hcl 10 Mg Tabs (Cyclobenzaprine Hcl) .... 2 By Mouth At Bedtime 11)  Lamictal 100 Mg Tabs (Lamotrigine) .[name].. 1 By Mouth At Bedtime 12)  Alprazolam 1 Mg Tabs (Alprazolam) .... 2 By Mouth At Bedtime;take 1 As Needed During Day 13)  Mirapex 0.5 Mg Tabs (Pramipexole Dihydrochloride) .[name].. 1 By Mouth At Bedtime 14)  Lyrica 100 Mg Caps (Pregabalin) .[name].. 1 By Mouth At Bedtime 15)  Ibuprofen 200 Mg Caps (Ibuprofen) .... As Needed 16)  Fioricet 50-325-40 Mg Tabs (Butalbital-Apap-Caffeine) .... 2 By Mouth As Needed For Migraines 17)  Antihistamine Decongestant 2.5-60 Mg Tabs (Triprolidine-Pseudoephedrine) .... As Needed 18)  Meclizine Hcl 25 Mg Tabs (Meclizine Hcl) .... As Needed 19)  Melatonin 300 Mcg Tabs (Melatonin) .[name].. 1 By Mouth At Bedtime 20)  Zonisamide 100 Mg Caps (Zonisamide) .... 2 By Mouth  As Directed 21)  Demerol 50 Mg Tabs (Meperidine Hcl) .[name].. 1 By Mouth Every 3 Hours As Needed 22)  Promethazine Hcl 25 Mg Tabs (Promethazine Hcl) .... As Needed 23)  Carbamazepine 100 Mg Chew (Carbamazepine) .[name].. 1 By Mouth Three Times A Day 24)  Loratadine 10 Mg Tabs (Loratadine) .[name].. 1 By Mouth Once Daily 25)  Fluticasone Propionate 50 Mcg/act Susp (Fluticasone Propionate) .... 2 Sprays Each Nostril Two Times A Day 26)  Hydroxyzine Hcl 10 Mg Tabs (Hydroxyzine Hcl) .... Take 1  Tablet By Mouth Four Times A Day As Directed By Dr. Corinda Gubler 27)  Benzonatate 100 Mg Caps (Benzonatate) .... Take One Tablet By Mouth Three Times A Day As Needed For Cough 28)  Dulera 100-5 Mcg/act Aero (Mometasone Furo-Formoterol Fum) .... 2 Puffs Two Times A Day 29)  Vitamin D 1000 Unit Tabs (Cholecalciferol) .[name].. 1 By Mouth Daily 30)  Calcium 600 Mg Tabs (Calcium) .... 2 By Mouth Daily 31)  Delsym 30 Mg/72ml Lqcr (Dextromethorphan Polistirex) .... As Directed 32)  Coq10 100 Mg Caps (Coenzyme Q10) .... Once Daily 33)  Abilify 10 Mg Tabs (Aripiprazole) .... Once Daily 34)  Bepreve 1.5 % Soln (Bepotastine Besilate) .... As Directed Daily  Allergies (verified): 1)  ! * Lithium  Vital Signs:  Patient profile:   65 year old female Height:      64 inches (162.56 cm) Weight:      228 pounds (103.64 kg) BMI:     39.28 O2 Sat:      100 % on Room air Temp:     98.2 degrees F (36.78 degrees C) oral Pulse rate:   89 / minute BP sitting:   118 / 60  (right arm) Cuff size:   regular  Vitals Entered By: Michel Bickers CMA (October 07, 2009 2:13 PM)  O2 Sat at Rest %:  100 O2 Flow:  Room air CC: Cough.  The patient says there is no change in her cough...prod with white to yellow mucus...Dr. Maple Hudson sent patient for CT chest on 10/06/2009. Comments Medications reviewed with the patient. Daytime phone verified. Michel Bickers [facility]  October 07, 2009 2:14 PM   Physical Exam  General:  normal appearance, healthy appearing, and obese. Coughing frequently, difficulty talking due to cough  Head:  normocephalic and atraumatic Eyes:  conjunctiva and sclera clear Nose:  no deformity, discharge, inflammation, or lesions Mouth:  posterior pharyngeal erythema Neck:  no masses, thyromegaly, or abnormal cervical nodes Lungs:  clear bilaterally to auscultation and percussion Heart:  regular rate and rhythm, S1, S2 without murmurs, rubs, gallops, or clicks Abdomen:  not examined Extremities:  1 + LE  edema Neurologic:  non-focal Skin:  intact without lesions or rashes Psych:  alert and cooperative; normal mood and affect; normal attention span and concentration   CT of Chest  Procedure date:  10/06/2009  Findings:      Findings: Pulmonary arteries are borderline enlarged.  No pathologically enlarged mediastinal, hilar or axillary lymph nodes. Heart size normal.  No pericardial effusion.   There is peribronchovascular ground-glass in both lower lobes, right greater than left.  A subpleural lymph node in the right middle lobe measures 6 mm.  No pleural fluid.  Airway is unremarkable.   Incidental imaging of the upper abdomen shows low attenuation lesions in the liver, measuring up to 1.8 cm, which are likely cysts.  Intrathecal stimulator wires terminate in the mid thoracic spine.  No worrisome lytic or sclerotic lesions.   IMPRESSION:  1.  Peribronchovascular ground-glass in both lower lobes is most likely infectious or inflammatory in etiology. 2.  Subpleural right middle lobe nodule may represent a subpleural lymph node. If the patient is at high risk for bronchogenic carcinoma, follow-up chest CT at 6-12 months is recommended.  If the patient is at low risk for bronchogenic carcinoma, follow-up chest CT at 12 months is recommended.   Impression & Recommendations:  Problem # 1:  COUGH (ICD-786.2)  Note mild peribroinchial inflammation esp RLL on CT, could represent non-specific inflammation, ? MAIC. Still coughing frequently. Would like to perform FOB to get AFB cx's and also to confirm no anatomical explanation for cough.  - schedule FOB next week - continue the [facility]  Orders: Est. Patient Level IV (16109)  Problem # 2:  ALLERGIC RHINITIS (ICD-477.9)  Her updated medication list for this problem includes:    Promethazine Hcl 25 Mg Tabs (Promethazine hcl) .[name]... As needed    Loratadine 10 Mg Tabs (Loratadine) .[name]... 1 by mouth once daily    Fluticasone  Propionate 50 Mcg/act Susp (Fluticasone propionate) .[name]... 2 sprays each nostril two times a day  Problem # 3:  G E R D (ICD-530.81)  Her updated medication list for this problem includes:    Nexium 40 Mg Cpdr (Esomeprazole magnesium) .[name]... 1 by mouth every morning    Prilosec Otc 20 Mg Tbec (Omeprazole magnesium) .[name]... 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 2 puffs two times a day 2)  Vitamin D 1000 Unit Tabs (Cholecalciferol) .[name].. 1 by mouth daily 3)  Calcium 600 Mg Tabs (Calcium) .... 2 by mouth daily 4)  Delsym 30 Mg/76ml Lqcr (Dextromethorphan polistirex) .... As directed 5)  Coq10 100 Mg Caps (Coenzyme q10) .... Once daily 6)  Abilify 10 Mg Tabs (Aripiprazole) .... Once daily 7)  Bepreve 1.5 % Soln (Bepotastine besilate) .... As directed daily  Patient Instructions: 1)  We will perform bronchoscopy Tuesday 10/14/09 at 1:30. Please arrive at 12:15pm 2)  Continue your [facility] two times a day for now.  3)  Continue your reflux regimen. We may need to refer you to GI to re-evaluate.  4)  Follow up with Dr Delton Coombes in 1 month or as needed

## 2010-03-10 NOTE — Progress Notes (Signed)
Summary: med question  Phone Note Call from Patient Call back at [phone]   Caller: Patient Call For: byrum Summary of Call: Wants to know if she should continue to take hydrohyzine hcl 10mg  prescribed by Dr. Stevphen Rochester. Initial call taken by: Darletta Moll,  July 30, 2009 10:06 AM  Follow-up for Phone Call        Dr. Delton Coombes, pt is taking hydroxizine 10 mg four times a day per Dr Corinda Gubler (failed to mention this at [facility])- do you want her to continue to take this? Please advise, thanks! Follow-up by: Vernie Murders,  July 30, 2009 10:22 AM  Additional Follow-up for Phone Call Additional follow up Details #1::        Yes, I told her this but didn't write it down in her instructions. It i sfine for her to continue it 4x a day.  Additional Follow-up by: Leslye Peer MD,  July 30, 2009 3:11 PM    Additional Follow-up for Phone Call Additional follow up Details #2::    called spoke with patient and informed her of RB's response/recs for the hydroxyzine.  pt verbalized her understanding.  med added to pt's med list. Boone Master CNA/MA  July 30, 2009 3:30 PM   New/Updated Medications: HYDROXYZINE HCL 10 MG TABS (HYDROXYZINE HCL) Take 1 tablet by mouth four times a day as directed by Dr. Corinda Gubler

## 2010-03-10 NOTE — Letter (Signed)
Summary: [facility]  [facility]   Imported By: Sherian Rein 12/24/2009 14:49:40  _____________________________________________________________________  External Attachment:    Type:   Image     Comment:   External Document

## 2010-03-10 NOTE — Assessment & Plan Note (Signed)
Summary: chronic cough   Visit Type:  Hospital Follow-up Copy to:  Dr. Stevphen Rochester Primary Provider/Referring Provider:  Dr. Joycelyn Rua  CC:  hfu. pt was in hospital with pna. pt states her breathing is okay if she is sitting down. still get real sob with any activity. pt c/o dry cough. .  History of Present Illness: 65 yo former smoker, hx documented allergies and rhinitis, chronic cough. Has seen Dr Corinda Gubler regarding allergies and the cough. For the last month the cough has been much worse, happens with talking and exertion. She has been empirically treated with by mouth steroids and also started on inhaled LABA/ICS. Skin testing has shown no sensitivity to cats/dogs although she has 30 animals in her home. Spirometry done 07/03/09 showed normal airflows. She has also been treated for GERD with Nexium + prilosec. She is not on any antihistamines or nasal sprays.   ROV 10/07/09 -- returns for chronic cough and UA symptoms. Referred her to see Dr Maple Hudson for allergy re-eval in setting huge animal exposure at home. Her skin testing was not very impressive, confirmed prob no role for immunotherapy. Dr Maple Hudson ordered CT scan, showed some subtle peri-bronchovascular infiltrate, also showed a nodule that will need to be followed. Retried her on LABA/ICS Elwin Sleight) to see if she'd benefit - she has noticed some benefit. Still with hoarse voice, frequent cough. Dr Maple Hudson raises question of phrenic nerve involvement, which is certainly a possibility. Has been off prednisone x 10 days. GERD symptoms "come and go".   ROV 11/04/09 -- returns for cough. Her HSP panel is negative for fungal sensitivity, IgE and eosinophils normal. Her cough persists, may be a bit better. Able to carry on more conversation, talking does make it worse. Dr Maple Hudson rightly raises possibility of auto-immune process, even BAC.   ROV 12/03/09 -- chronic cough, chronic rhinitis, normal AF on spirometry. She was admitted 10/9-10/12 to [facility]  with fever, lightheadedness, dyspnea, cough was actually better. Treated for ? COPD exac or PNA. CT scan without infiltrates (per d/c summary). Cleda Daub 5/11 normal. She believes that the [facility] helps her. Her cough is better. 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   Current Medications (verified): 1)  Budeprion Sr 150 Mg Xr12h-Tab (Bupropion Hcl) .Marland Kitchen.. 1 By Mouth Three Times A Day 2)  Ritalin 20 Mg Tabs (Methylphenidate Hcl) .Marland Kitchen.. 1 By Mouth Two Times A Day 3)  Furosemide 40 Mg Tabs (Furosemide) .Marland Kitchen.. 1 By Mouth Three Times A Day 4)  Nexium 40 Mg Cpdr (Esomeprazole Magnesium) .Marland Kitchen.. 1 By Mouth Every Morning 5)  Prilosec Otc 20 Mg Tbec (Omeprazole Magnesium) .Marland Kitchen.. 1 By Mouth At Night 6)  Multivitamins  Tabs (Multiple Vitamin) .Marland Kitchen.. 1 By Mouth Daily 7)  Potassium Gluconate 595 Mg Tabs (Potassium Gluconate) .Marland Kitchen.. 1 By Mouth Two Times A Day 8)  Lovastatin 40 Mg Tabs (Lovastatin) .... Take 1 By Mouth Once Daily 9)  Cyclobenzaprine Hcl 10 Mg Tabs (Cyclobenzaprine Hcl) .... 2 By Mouth At Bedtime 10)  Lamictal 100 Mg Tabs (Lamotrigine) .Marland Kitchen.. 1 By Mouth At Bedtime 11)  Alprazolam 1 Mg Tabs (Alprazolam) .... 2 By Mouth At Bedtime;take 1 As Needed During Day 12)  Mirapex 0.5 Mg Tabs (Pramipexole Dihydrochloride) .Marland Kitchen.. 1 By Mouth At Bedtime 13)  Lyrica 100 Mg Caps (Pregabalin) .Marland Kitchen.. 1 By Mouth At Bedtime 14)  Ibuprofen 200 Mg Caps (Ibuprofen) .... As Needed 15)  Fioricet 50-325-40 Mg Tabs (Butalbital-Apap-Caffeine) .... 2 By  Mouth As Needed For Migraines 16)  Antihistamine Decongestant 2.5-60 Mg Tabs (Triprolidine-Pseudoephedrine) .... As Needed 17)  Melatonin 300 Mcg Tabs (Melatonin) .Marland Kitchen.. 1 By Mouth At Bedtime 18)  Zonisamide 100 Mg Caps (Zonisamide) .... 2 By Mouth As Directed 19)  Hydrocodone-Acetaminophen 5-325 Mg Tabs (Hydrocodone-Acetaminophen) .... One Tablet Every 12 Hours 20)  Promethazine Hcl 25 Mg Tabs (Promethazine Hcl) .... As Needed 21)  Loratadine 10 Mg Tabs  (Loratadine) .Marland Kitchen.. 1 By Mouth Once Daily 22)  Fluticasone Propionate 50 Mcg/act Susp (Fluticasone Propionate) .... 2 Sprays Each Nostril Two Times A Day 23)  Dulera 100-5 Mcg/act Aero (Mometasone Furo-Formoterol Fum) .... 2 Puffs Two Times A Day 24)  Vitamin D 1000 Unit Tabs (Cholecalciferol) .Marland Kitchen.. 1 By Mouth Daily 25)  Calcium 600 Mg Tabs (Calcium) .... 2 By Mouth Daily 26)  Delsym 30 Mg/61ml Lqcr (Dextromethorphan Polistirex) .... As Directed 27)  Coq10 100 Mg Caps (Coenzyme Q10) .... Once Daily 28)  Abilify 10 Mg Tabs (Aripiprazole) .... Once Daily 29)  Bepreve 1.5 % Soln (Bepotastine Besilate) .... As Directed Daily 30)  Docusate Calcium 240 Mg Caps (Docusate Calcium) .... Take 1 By Mouth Weekly 31)  Sinus Rinse Kit  Pack (Hypertonic Nasal Wash) .Marland Kitchen.. 1 Time Daily 32)  Fluorometholone 0.1 % Susp (Fluorometholone) .Marland Kitchen.. 1 Drop in Each Eye Two Times A Day As Needed 33)  Pennsaid 1.5 % Soln (Diclofenac Sodium) .Marland Kitchen.. 1 Time A Day 34)  Proair Hfa 108 (90 Base) Mcg/act Aers (Albuterol Sulfate) .... 2 Puffs Every 4 Hours As Needed 35)  Docusate Sodium 100 Mg Tabs (Docusate Sodium) .... 2 Capsules Daily 36)  Meclizine Hcl 25 Mg Tabs (Meclizine Hcl) .... As Needed  Allergies (verified): 1)  ! * Lithium  Vital Signs:  Patient profile:   65 year old female Height:      64 inches Weight:      221 pounds O2 Sat:      97 % on Room air Temp:     98.2 degrees F oral Pulse rate:   77 / minute BP sitting:   112 / 78  (left arm) Cuff size:   large  Vitals Entered By: Carver Fila (December 03, 2009 3:01 PM)  O2 Flow:  Room air CC: hfu. pt was in hospital with pna. pt states her breathing is okay if she is sitting down. still get real sob with any activity. pt c/o dry cough.  Comments meds and allergies updated Phone number updated Carver Fila  December 03, 2009 3:01 PM    Physical Exam  General:  normal appearance, healthy appearing, and obese. Cough is much better Head:  normocephalic and  atraumatic Eyes:  conjunctiva and sclera clear Nose:  no deformity, discharge, inflammation, or lesions Mouth:  posterior pharyngeal erythema Neck:  no masses, thyromegaly, or abnormal cervical nodes Lungs:  clear bilaterally to auscultation and percussion Heart:  regular rate and rhythm, S1, S2 without murmurs, rubs, gallops, or clicks Abdomen:  not examined Extremities:  1 + LE edema Neurologic:  non-focal Skin:  intact without lesions or rashes Psych:  alert and cooperative; normal mood and affect; normal attention span and concentration   Impression & Recommendations:  Problem # 1:  COUGH (ICD-786.2)  Suspect this is due to her heavy cat litter/dust exposure. 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 Normal spirometry in May, have not been able to get full PFT.  - treat allergies, avoid the cat dust - full PFT's   Orders: Est. Patient  Level IV (16109)  Problem # 2:  ALLERGIC RHINITIS (ICD-477.9)  Her updated medication list for this problem includes:    Promethazine Hcl 25 Mg Tabs (Promethazine hcl) .Marland Kitchen... As needed    Loratadine 10 Mg Tabs (Loratadine) .Marland Kitchen... 1 by mouth once daily    Fluticasone Propionate 50 Mcg/act Susp (Fluticasone propionate) .Marland Kitchen... 2 sprays each nostril two times a day    Sinus Rinse Kit Pack (Hypertonic nasal wash) .Marland Kitchen... 1 time daily  Her updated medication list for this problem includes:    Promethazine Hcl 25 Mg Tabs (Promethazine hcl) .Marland Kitchen... As needed    Loratadine 10 Mg Tabs (Loratadine) .Marland Kitchen... 1 by mouth once daily    Fluticasone Propionate 50 Mcg/act Susp (Fluticasone propionate) .Marland Kitchen... 2 sprays each nostril two times a day    Sinus Rinse Kit Pack (Hypertonic nasal wash) .Marland Kitchen... 1 time daily  Orders: Est. Patient Level IV (60454)  Problem # 3:  ABNORMAL CHEST XRAY (ICD-793.1) Will obtain CT [facility] from [facility] to compare w priors. Currently scheduled to get CT scan in Dec to follow nodule.   Medications Added to Medication List This  Visit: 1)  Hydrocodone-acetaminophen 5-325 Mg Tabs (Hydrocodone-acetaminophen) .... One tablet every 12 hours 2)  Proair Hfa 108 (90 Base) Mcg/act Aers (Albuterol sulfate) .... 2 puffs every 4 hours as needed 3)  Docusate Sodium 100 Mg Tabs (Docusate sodium) .... 2 capsules daily 4)  Meclizine Hcl 25 Mg Tabs (Meclizine hcl) .... As needed 5)  Proair Hfa 108 (90 Base) Mcg/act Aers (Albuterol sulfate) .... 2 puffs q4h as needed sob  Patient Instructions: 1)  Continue your current allergy regimen 2)  Continue Dulera two times a day  3)  We will perform full PFT's 4)  Use ProAir as needed  5)  Try to avoid dust exposure if possible. Use a mask if needed.  6)  Follow up with Dr Delton Coombes after your PFT's have been completed to review.  Prescriptions: PROAIR HFA 108 (90 BASE) MCG/ACT AERS (ALBUTEROL SULFATE) 2 puffs q4h as needed SOB  #1 x 5   Entered and Authorized by:   Leslye Peer MD   Signed by:   Leslye Peer MD on 12/03/2009   Method used:   Electronically to        [facility]* (retail)       [address]       [place], [place]  [id number]       Ph: [phone]       Fax: [phone]   RxID:   [id number]        Immunization History:  Influenza Immunization History:    Influenza:  historical (11/08/2009)  Pneumovax Immunization History:    Pneumovax:  historical (11/08/2008)    Pneumovax:  historical (02/08/2009)

## 2010-03-10 NOTE — Progress Notes (Signed)
Summary: refill  Phone Note Call from Patient Call back at Work Phone [phone]   Caller: Patient Call For: byrum Reason for Call: Refill Medication Summary of Call: Pt states pharmacy still has received her rx for proair hfa 108, wants it call in to [facility]. Initial call taken by: Darletta Moll,  December 08, 2009 9:39 AM  Follow-up for Phone Call        PT notified that rx for Proair was sent to [facility] on 12-03-09. Follow-up by: Abigail Miyamoto RN,  December 08, 2009 10:01 AM

## 2010-03-10 NOTE — Letter (Signed)
Summary: [facility]  [facility]   Imported By: Sherian Rein 07/31/2009 09:28:46  _____________________________________________________________________  External Attachment:    Type:   Image     Comment:   External Document

## 2010-03-10 NOTE — Assessment & Plan Note (Signed)
Summary: 1 month/apc   Copy to:  Dr. Stevphen Rochester Primary Provider/Referring Provider:  Dr. Joycelyn Rua  CC:  1 month follow up visit-allergies and cough; coughing "comes and goes"..  History of Present Illness: September 30, 2009- 65 yoF referred by Dr Delton Coombes for evaluation of cough. Notes reviewed. Cough began 5 years ago after repairing kichen floor and ceiling tile likely to have asbestos. 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 Quit smoking in 1988 and earlier cough cleared then- had been up to 2.5 PPD. Mother died of lung cancer. Patient concerned about possibility of cancer. Has GERD treated with endoscopy by Dr Loreta Ave. Dr Delton Coombes took her off inhalers that weren't helping. She thinks steroid inhaler did help. Cough feels as if it starts with diaphragm.  She was seen by Dr Corinda Gubler with no allergy f/u. He told her he didn't think she needed allergy shots, after skin testing positive mainly for dust and mold, not animals. She "rescues" animals and has 50 animals with 13 liter boxes which she is not prepared to give up. Dr Dohmeier eval for ? Myasthenia Gravis. Denies choke or strangle with eating. Takes sedating meds at night that would prevent her from noting if she refluxes at night. Speech therapist suggested Modified Barium Swallow.  Doesn't notice much postnasal drip.   October 30, 2009- Pulmonary nodule/ Abnl CXR (Dr Delton Coombes), Allergic rhinitis, GERD, hx tobacco F/u allergy eval at request of Dr Delton Coombes. She had bronch fo f/u w/ Dr Delton Coombes nest week. Cough may have improved some over long term, but still significant. Planning to have all teeth puilled tomorrow for dentures. Recent few days was sneezing c/w ragweed peak, but she isn't noticing ongoing nasal congestion.  She does note episodes of itching eyes. Dr Corinda Gubler had skin tested and not found much. He told her she din't have significant allergy and didn't have asthma. 5 years ago she had 250 mice in the home, plus lots of dogs and  cats which she still has.. She emphasizes her heavy tobacco hx. Allergy profile- IgE 1.5, no specific elevations CBC- WBC was 14,900 Chem- K was 2.9. CT- ground glass changes lower lobes- addressed w/ bronchoscopy by Dr Delton Coombes      Preventive Screening-Counseling & Management  Alcohol-Tobacco     Alcohol drinks/day: 0     Smoking Status: quit     Year Quit: 1988     Pack years: 21 years x 2 1/2 ppd     Tobacco Counseling: not indicated; no tobacco use  Current Medications (verified): 1)  Budeprion Sr 150 Mg Xr12h-Tab (Bupropion Hcl) .Marland Kitchen.. 1 By Mouth Three Times A Day 2)  Ritalin 20 Mg Tabs (Methylphenidate Hcl) .Marland Kitchen.. 1 By Mouth Two Times A Day 3)  Furosemide 40 Mg Tabs (Furosemide) .Marland Kitchen.. 1 By Mouth Three Times A Day 4)  Nexium 40 Mg Cpdr (Esomeprazole Magnesium) .Marland Kitchen.. 1 By Mouth Every Morning 5)  Prilosec Otc 20 Mg Tbec (Omeprazole Magnesium) .Marland Kitchen.. 1 By Mouth At Night 6)  Multivitamins  Tabs (Multiple Vitamin) .Marland Kitchen.. 1 By Mouth Daily 7)  Potassium Gluconate 595 Mg Tabs (Potassium Gluconate) .Marland Kitchen.. 1 By Mouth Two Times A Day 8)  Docusate Sodium 100 Mg Caps (Docusate Sodium) .... 2 By Mouth Daily 9)  Lovastatin 40 Mg Tabs (Lovastatin) .... Take 1 By Mouth Once Daily 10)  Cyclobenzaprine Hcl 10 Mg Tabs (Cyclobenzaprine Hcl) .... 2 By Mouth At Bedtime 11)  Lamictal 100 Mg Tabs (Lamotrigine) .Marland Kitchen.. 1 By Mouth At Bedtime 12)  Alprazolam 1 Mg Tabs (Alprazolam) .... 2 By Mouth At Bedtime;take 1 As Needed During Day 13)  Mirapex 0.5 Mg Tabs (Pramipexole Dihydrochloride) .Marland Kitchen.. 1 By Mouth At Bedtime 14)  Lyrica 100 Mg Caps (Pregabalin) .Marland Kitchen.. 1 By Mouth At Bedtime 15)  Ibuprofen 200 Mg Caps (Ibuprofen) .... As Needed 16)  Fioricet 50-325-40 Mg Tabs (Butalbital-Apap-Caffeine) .... 2 By Mouth As Needed For Migraines 17)  Antihistamine Decongestant 2.5-60 Mg Tabs (Triprolidine-Pseudoephedrine) .... As Needed 18)  Meclizine Hcl 25 Mg Tabs (Meclizine Hcl) .... As Needed 19)  Melatonin 300 Mcg Tabs (Melatonin)  .Marland Kitchen.. 1 By Mouth At Bedtime 20)  Zonisamide 100 Mg Caps (Zonisamide) .... 2 By Mouth As Directed 21)  Demerol 50 Mg Tabs (Meperidine Hcl) .Marland Kitchen.. 1 By Mouth Every 3 Hours As Needed 22)  Promethazine Hcl 25 Mg Tabs (Promethazine Hcl) .... As Needed 23)  Loratadine 10 Mg Tabs (Loratadine) .Marland Kitchen.. 1 By Mouth Once Daily 24)  Fluticasone Propionate 50 Mcg/act Susp (Fluticasone Propionate) .... 2 Sprays Each Nostril Two Times A Day 25)  Dulera 100-5 Mcg/act Aero (Mometasone Furo-Formoterol Fum) .... 2 Puffs Two Times A Day 26)  Vitamin D 1000 Unit Tabs (Cholecalciferol) .Marland Kitchen.. 1 By Mouth Daily 27)  Calcium 600 Mg Tabs (Calcium) .... 2 By Mouth Daily 28)  Delsym 30 Mg/5ml Lqcr (Dextromethorphan Polistirex) .... As Directed 29)  Coq10 100 Mg Caps (Coenzyme Q10) .... Once Daily 30)  Abilify 10 Mg Tabs (Aripiprazole) .... Once Daily 31)  Bepreve 1.5 % Soln (Bepotastine Besilate) .... As Directed Daily 32)  Docusate Calcium 240 Mg Caps (Docusate Calcium) .... Take 1 By Mouth Weekly 33)  Sinus Rinse Kit  Pack (Hypertonic Nasal Wash) .Marland Kitchen.. 1 Time Daily 34)  Fluorometholone 0.1 % Susp (Fluorometholone) .Marland Kitchen.. 1 Drop in Each Eye Two Times A Day 35)  Pennsaid 1.5 % Soln (Diclofenac Sodium) .Marland Kitchen.. 1 Time A Day  Allergies (verified): 1)  ! * Lithium  Review of Systems      See HPI       The patient complains of shortness of breath with activity, non-productive cough, sneezing, and itching.  The patient denies coughing up blood, chest pain, irregular heartbeats, acid heartburn, indigestion, loss of appetite, weight change, abdominal pain, difficulty swallowing, sore throat, tooth/dental problems, headaches, nasal congestion/difficulty breathing through nose, hand/feet swelling, rash, change in color of mucus, and fever.    Vital Signs:  Patient profile:   65 year old female Height:      64 inches Weight:      230.38 pounds BMI:     39.69 O2 Sat:      96 % on Room air Pulse rate:   78 / minute BP sitting:   118 / 78   (left arm) Cuff size:   large  Vitals Entered By: Reynaldo Minium CMA (October 30, 2009 2:58 PM)  O2 Flow:  Room air CC: 1 month follow up visit-allergies and cough; coughing "comes and goes".   Physical Exam  Additional Exam:  General: A/Ox3; pleasant and cooperative, NAD, SKIN:bruises on arms NODES: no lymphadenopathy HEENT: [place]/AT, EOM- WNL, Conjuctivae- clear, PERRLA, TM-WNL, Nose- clear, Throat- clear and wnl, not red,  Mallampati  III NECK: Supple w/ fair ROM, JVD- none, normal carotid impulses w/o bruits Thyroid- normal to palpation. 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 Pursed lips helps her control cough. HEART: RRR, no m/g/r heard ABDOMEN: Soft and nl;Marland Kitchen Overweight RJJ:OACZ, nl pulses, no edema  NEURO:  Grossly intact to observation      Impression & Recommendations:  Problem # 1:  COUGH (ICD-786.2) Persistent cough with abnormal ground glass on CT and elevated WBC. Consider that she might have either a hypersensitivity pneumonia, or an infection related to the large numbers of cats, dogs and mice that she has kept in her home. I will send hypersensitivity panel.  Consider evaluation for ongoing infection related to the animals, their liter, feed or etc.. Consider evaluating for a vasculitis ? Jake Shark, ? Wegener's? I am directing her to Dr Delton Coombes for her main pulmonary management, explaining that  I was involved specifically to look for an immunologic process. She has no elevation of IgE.  Problem # 2:  ABNORMAL CHEST XRAY (ICD-793.1) Radiology shows ground glass- pneumonitis or patchy edema. She was a very heavy smoker, so consider possibility of BAC or other non-inflammatory process in future.  Medications Added to Medication List This Visit: 1)  Docusate Calcium 240 Mg Caps (Docusate calcium) .... Take 1 by mouth weekly 2)  Sinus Rinse Kit Pack (Hypertonic nasal wash) .Marland Kitchen.. 1 time daily 3)  Fluorometholone 0.1 %  Susp (Fluorometholone) .Marland Kitchen.. 1 drop in each eye two times a day 4)  Pennsaid 1.5 % Soln (Diclofenac sodium) .Marland Kitchen.. 1 time a day  Other Orders: Est. Patient Level IV (16109) TLB-CBC Platelet - w/Differential (85025-CBCD) TLB-BMP (Basic Metabolic Panel-BMET) (80048-METABOL) T-Fungal Panel Immuno ([id number]) T-Hypersens Panel (86331/[id number])  Patient Instructions: 1)  Please schedule a follow-up appointment as needed. 2)  Lab 3)  F/U with Dr Delton Coombes as planned.

## 2010-03-10 NOTE — Progress Notes (Signed)
Summary: FYI---dx with PNA by PCP  Phone Note Call from Patient   Caller: Patient Call For: byrum Summary of Call: primary dr did xray and inform pt that she has pnuemonia just wanted to give FYI Initial call taken by: Rickard Patience,  November 14, 2009 1:34 PM  Follow-up for Phone Call        called and spoke with pt. pt states she saw her pcp, Dr. Lanier Ensign,  yesterday d/t tightness in chest and cough and had cxr done.  pt dx with pna and put on a zpak. Pt just requests this message be forwarded to RB as an FYI.  Aundra Millet Reynolds LPN  November 14, 2009 2:27 PM

## 2010-03-10 NOTE — Letter (Signed)
Summary: [facility]  [facility]   Imported By: Sherian Rein 12/10/2009 08:32:25  _____________________________________________________________________  External Attachment:    Type:   Image     Comment:   External Document

## 2010-03-10 NOTE — Progress Notes (Signed)
Summary: returned call  Phone Note Call from Patient   Caller: Patient Call For: [facility] Summary of Call: pt returned call from katie/ results. cell [phone] Initial call taken by: Tivis Ringer, CNA,  October 09, 2009 10:32 AM  Follow-up for Phone Call        Spoke with pt-aware of CT results and RX CDY is giving her-states to have RX [facility] [address]. Reynaldo Minium CMA  October 09, 2009 2:37 PM     New/Updated Medications: DOXYCYCLINE HYCLATE 100 MG TABS (DOXYCYCLINE HYCLATE) take 2 by mouth today then 1 daily until gone. Prescriptions: DOXYCYCLINE HYCLATE 100 MG TABS (DOXYCYCLINE HYCLATE) take 2 by mouth today then 1 daily until gone.  #8 x 0   Entered by:   Reynaldo Minium CMA   Authorized by:   Waymon Budge MD   Signed by:   Reynaldo Minium CMA on 10/09/2009   Method used:   [phone]       [address] [place], [place]  [address]       Ph: [phone] or [phone]       Fax: [phone]   RxID:   [id number]

## 2010-03-10 NOTE — Progress Notes (Signed)
Summary: prescription  Phone Note Call from Patient Call back at [phone]   Caller: Patient Call For: byrum Summary of Call: Need rx for hydrocodone-chlorpheniramine 120mg .//[place] Initial call taken by: Darletta Moll,  September 03, 2009 11:04 AM  Follow-up for Phone Call        RB---tussionex was given to pt on 7-19---pt is now requesting refill for this.  please advise if this ok to send in.  thanks Randell Loop CMA  September 03, 2009 11:08 AM   pt called back to add a 2nd refill request. this needs prior auth per pt- it is for BENZONATATE. [facility] [place] [facility]. Tivis Ringer, CNA  September 04, 2009 10:37 AM  Additional Follow-up for Phone Call Additional follow up Details #1::        Spoke with pt.  She is requesting a refill on tussionex and also for tessalon.  She still has 10 of the tessalno pearles left but wants to go ahead and get this and tussionex filled at the same time.  Had tussionex filled last on 7/19.  Please advise thanks Vernie Murders  September 04, 2009 10:43 AM allrgic to lithium    Additional Follow-up for Phone Call Additional follow up Details #2::    Please route to RB in [facility].Reynaldo Minium CMA  September 04, 2009 12:18 PM   No pt's in [place] today for RB.  Will forward back to CY.  Gweneth Dimitri RN  September 04, 2009 12:25 PM  OK to refill the benzonatate but too early to refill the tussionex. Thanks, Leslye Peer MD  September 05, 2009 12:13 PM   Follow-up by: Leslye Peer MD,  September 05, 2009 12:13 PM  Additional Follow-up for Phone Call Additional follow up Details #3:: Details for Additional Follow-up Action Taken: Spoke with pt and advised that we will not refill tussionex at this time- can go ahead and get refill on benzonatate.  This was sent to pharm. Additional Follow-up by: Vernie Murders,  September 05, 2009 12:27 PM  Prescriptions: BENZONATATE 100 MG CAPS (BENZONATATE) take one tablet by mouth three times a day as needed for cough  #30 x 0  Entered by:   Vernie Murders   Authorized by:   Leslye Peer MD   Signed by:   Vernie Murders on 09/05/2009   Method used:   [facility]  [address] [phone]       [address] [place], [place]  [address]       Ph: [phone] or [phone]       Fax: [phone]   RxID:   [id number]

## 2010-03-10 NOTE — Assessment & Plan Note (Signed)
Summary: cough   Visit Type:  Follow-up Copy to:  Dr. Stevphen Rochester Primary Provider/Referring Provider:  Dr. Joycelyn Rua  CC:  The patient says her cough is better. She does c/o chest tightness and occasional wheezing.Marland Kitchen  History of Present Illness: 65 yo former smoker, hx documented allergies and rhinitis, chronic cough. Has seen Dr Corinda Gubler regarding allergies and the cough. For the last month the cough has been much worse, happens with talking and exertion. She has been empirically treated with by mouth steroids and also started on inhaled LABA/ICS. Skin testing has shown no sensitivity to cats/dogs although she has 30 animals in her home. Spirometry done 07/03/09 showed normal airflows. She has also been treated for GERD with Nexium + prilosec. She is not on any antihistamines or nasal sprays.   ROV 08/26/09 -- returns to f/u We started [place], nasal steroid, loratadine. She is on Nexium once daily. She still has animals, 13 litter boxes! She is not allergic on skin testing to cats, but she is very sensitive to dusts and molds. Cough may be better but still very limiting. Still with PND even on good regimen  Current Medications (verified): 1)  Budeprion Sr 150 Mg Xr12h-Tab (Bupropion Hcl) .Marland Kitchen.. 1 By Mouth Two Times A Day 2)  Ritalin 20 Mg Tabs (Methylphenidate Hcl) .Marland Kitchen.. 1 By Mouth Two Times A Day 3)  Furosemide 40 Mg Tabs (Furosemide) .Marland Kitchen.. 1 By Mouth Three Times A Day 4)  Nexium 40 Mg Cpdr (Esomeprazole Magnesium) .Marland Kitchen.. 1 By Mouth Every Morning 5)  Prilosec Otc 20 Mg Tbec (Omeprazole Magnesium) .Marland Kitchen.. 1 By Mouth At Night 6)  Multivitamins  Tabs (Multiple Vitamin) .Marland Kitchen.. 1 By Mouth Daily 7)  Potassium Gluconate 595 Mg Tabs (Potassium Gluconate) .Marland Kitchen.. 1 By Mouth Two Times A Day 8)  Docusate Sodium 100 Mg Caps (Docusate Sodium) .... 2 By Mouth Daily 9)  Lovastatin 20 Mg Tabs (Lovastatin) .Marland Kitchen.. 1 By Mouth At Bedtime 10)  Cyclobenzaprine Hcl 10 Mg Tabs (Cyclobenzaprine Hcl) .... 2 By Mouth At  Bedtime 11)  Lamictal 100 Mg Tabs (Lamotrigine) .Marland Kitchen.. 1 By Mouth At Bedtime 12)  Alprazolam 1 Mg Tabs (Alprazolam) .... 2 By Mouth At Bedtime 13)  Mirapex 0.5 Mg Tabs (Pramipexole Dihydrochloride) .Marland Kitchen.. 1 By Mouth At Bedtime 14)  Lyrica 100 Mg Caps (Pregabalin) .Marland Kitchen.. 1 By Mouth At Bedtime 15)  Ibuprofen 200 Mg Caps (Ibuprofen) .... As Needed 16)  Fioricet 50-325-40 Mg Tabs (Butalbital-Apap-Caffeine) .... 2 By Mouth As Needed For Migraines 17)  Antihistamine Decongestant 2.5-60 Mg Tabs (Triprolidine-Pseudoephedrine) .... As Needed 18)  Meclizine Hcl 25 Mg Tabs (Meclizine Hcl) .... As Needed 19)  Melatonin 300 Mcg Tabs (Melatonin) .Marland Kitchen.. 1 By Mouth At Bedtime 20)  Zonisamide 100 Mg Caps (Zonisamide) .... 2 By Mouth As Directed 21)  Demerol 50 Mg Tabs (Meperidine Hcl) .Marland Kitchen.. 1 By Mouth Every 3 Hours As Needed 22)  Promethazine Hcl 25 Mg Tabs (Promethazine Hcl) .... As Needed 23)  Carbamazepine 100 Mg Chew (Carbamazepine) .Marland Kitchen.. 1 By Mouth Three Times A Day 24)  Loratadine 10 Mg Tabs (Loratadine) .Marland Kitchen.. 1 By Mouth Once Daily 25)  Fluticasone Propionate 50 Mcg/act Susp (Fluticasone Propionate) .... 2 Sprays Each Nostril Two Times A Day 26)  Hydroxyzine Hcl 10 Mg Tabs (Hydroxyzine Hcl) .... Take 1 Tablet By Mouth Four Times A Day As Directed By Dr. Corinda Gubler  Allergies (verified): 1)  ! * Lithium  Vital Signs:  Patient profile:   65 year old female Height:  64 inches (162.56 cm) Weight:      223 pounds (101.36 kg) BMI:     38.42 O2 Sat:      96 % on Room air Temp:     98.0 degrees F (36.67 degrees C) oral Pulse rate:   96 / minute BP sitting:   120 / 80  (left arm) Cuff size:   regular  Vitals Entered By: Michel Bickers CMA (August 26, 2009 11:41 AM)  O2 Sat at Rest %:  96 O2 Flow:  Room air CC: The patient says her cough is better. She does c/o chest tightness and occasional wheezing. Comments Medication reviewed. Daytime phone verified. Michel Bickers [facility]  August 26, 2009 11:43 AM   Physical  Exam  General:  normal appearance, healthy appearing, and obese. Coughing frequently, difficulty talking due to cough  Head:  normocephalic and atraumatic Eyes:  conjunctiva and sclera clear Nose:  no deformity, discharge, inflammation, or lesions Mouth:  posterior pharyngeal erythema Neck:  no masses, thyromegaly, or abnormal cervical nodes Lungs:  clear bilaterally to auscultation and percussion Heart:  regular rate and rhythm, S1, S2 without murmurs, rubs, gallops, or clicks Abdomen:  not examined Extremities:  1 + LE edema Neurologic:  non-focal Skin:  intact without lesions or rashes Psych:  alert and cooperative; normal mood and affect; normal attention span and concentration   Impression & Recommendations:  Problem # 1:  COUGH (ICD-786.2) Being droiven by rhinitis that is very difficult to control, almost certainly du eto her exposure to dust and cat litter. She does not react to cats on skin testing, but I wonder if (in addition to a mask/avoidance) if she would benefit from immunotherapy for dust? PFT do not support asthma.  - allergy regimen as ooutlined, add benadryl at bedtime and stop hydroxyzine - I would like Dr Maple Hudson to review her skin testing to get input regarding immunotherapy - tussionex for symptom control; she gets nausea with codeine but not a true allergy.  - stay   Orders: Est. Patient Level IV ([address]8119) Allergy Referral  (Allergy)  Problem # 2:  ALLERGIC RHINITIS (ICD-477.9)  Her updated medication list for this problem includes:    Promethazine Hcl 25 Mg Tabs (Promethazine hcl) .Marland Kitchen... As needed    Loratadine 10 Mg Tabs (Loratadine) .Marland Kitchen... 1 by mouth once daily    Fluticasone Propionate 50 Mcg/act Susp (Fluticasone propionate) .Marland Kitchen... 2 sprays each nostril two times a day  Problem # 3:  G E R D (ICD-530.81)  Her updated medication list for this problem includes:    Nexium 40 Mg Cpdr (Esomeprazole magnesium) .Marland Kitchen... 1 by mouth every morning    Prilosec Otc 20  Mg Tbec (Omeprazole magnesium) .Marland Kitchen... 1 by mouth at night  Medications Added to Medication List This Visit: 1)  Tussionex Pennkinetic Er 8-10 Mg/27ml Lqcr (Chlorpheniramine-hydrocodone) .... 5cc by mouth q12h as needed cough  Patient Instructions: 1)  Please continue your nasal saline washes, fluticasone spray and loratadine once daily.  2)  Stop hydroxyzine 3)  You need to avoid dust and cat litter exposure as much as possible.  4)  We will try to suppress your cough with tussionex.  5)  Take benadryl 25mg  by mouth at bedtime  6)  We will refer you to Dr Maple Hudson for a second opinion regarding the benefits of allergy shots in your case.  7)  Follow up with Dr Delton Coombes in 6 weeks or as needed  Prescriptions: TUSSIONEX PENNKINETIC ER 8-10 MG/5ML LQCR (CHLORPHENIRAMINE-HYDROCODONE)  5cc by mouth q12h as needed cough  #4 oz x 0   Entered and Authorized by:   Leslye Peer MD   Signed by:   Leslye Peer MD on 08/26/2009   Method used:   Print then Give to Patient   RxID:   [id number]    Immunization History:  Influenza Immunization History:    Influenza:  historical (02/08/2009)

## 2010-03-10 NOTE — Assessment & Plan Note (Signed)
Summary: cough, allergies   Visit Type:  Initial Consult Copy to:  Dr. Stevphen Rochester Primary Provider/Referring Provider:  Dr. Joycelyn Rua  CC:  Pulmonary consult for cough. The patient c/o a chronic cough with clear mucus x5 years but worse x1 year.[name]  History of Present Illness: 65 yo former smoker, hx documented allergies and rhinitis, chronic cough. Has seen Dr Corinda Gubler regarding allergies and the cough. For the last month the cough has been much worse, happens with talking and exertion. She has been empirically treated with by mouth steroids and also started on inhaled LABA/ICS. Skin testing has shown no sensitivity to cats/dogs although she has 30 animals in her home. Spirometry done 07/03/09 showed normal airflows. She has also been treated for GERD with Nexium + prilosec. She is not on any antihistamines or nasal sprays.   Preventive Screening-Counseling & Management  Alcohol-Tobacco     Alcohol drinks/day: 0     Smoking Status: quit     Year Quit: 1988     Pack years: 21 years x 2 1/2 ppd  Current Medications (verified): 1)  Budeprion Sr 150 Mg Xr12h-Tab (Bupropion Hcl) .[name].. 1 By Mouth Two Times A Day 2)  Ritalin 20 Mg Tabs (Methylphenidate Hcl) .[name].. 1 By Mouth Two Times A Day 3)  Furosemide 40 Mg Tabs (Furosemide) .[name].. 1 By Mouth Three Times A Day 4)  Nexium 40 Mg Cpdr (Esomeprazole Magnesium) .[name].. 1 By Mouth Every Morning 5)  Prilosec Otc 20 Mg Tbec (Omeprazole Magnesium) .[name].. 1 By Mouth At Night 6)  Multivitamins  Tabs (Multiple Vitamin) .[name].. 1 By Mouth Daily 7)  Potassium Gluconate 595 Mg Tabs (Potassium Gluconate) .[name].. 1 By Mouth Two Times A Day 8)  Docusate Sodium 100 Mg Caps (Docusate Sodium) .... 2 By Mouth Daily 9)  Lovastatin 20 Mg Tabs (Lovastatin) .[name].. 1 By Mouth At Bedtime 10)  Cyclobenzaprine Hcl 10 Mg Tabs (Cyclobenzaprine Hcl) .... 2 By Mouth At Bedtime 11)  Lamictal 100 Mg Tabs (Lamotrigine) .[name].. 1 By Mouth At Bedtime 12)  Alprazolam 1 Mg Tabs (Alprazolam)  .... 2 By Mouth At Bedtime 13)  Mirapex 0.5 Mg Tabs (Pramipexole Dihydrochloride) .[name].. 1 By Mouth At Bedtime 14)  Lyrica 100 Mg Caps (Pregabalin) .[name].. 1 By Mouth At Bedtime 15)  Ibuprofen 200 Mg Caps (Ibuprofen) .... As Needed 16)  Fioricet 50-325-40 Mg Tabs (Butalbital-Apap-Caffeine) .... 2 By Mouth As Needed For Migraines 17)  Antihistamine Decongestant 2.5-60 Mg Tabs (Triprolidine-Pseudoephedrine) .... As Needed 18)  Meclizine Hcl 25 Mg Tabs (Meclizine Hcl) .... As Needed 19)  Melatonin 300 Mcg Tabs (Melatonin) .[name].. 1 By Mouth At Bedtime 20)  Zonisamide 100 Mg Caps (Zonisamide) .... 2 By Mouth As Directed 21)  Demerol 50 Mg Tabs (Meperidine Hcl) .[name].. 1 By Mouth Every 3 Hours As Needed 22)  Promethazine Hcl 25 Mg Tabs (Promethazine Hcl) .... As Needed 23)  Carbamazepine 100 Mg Chew (Carbamazepine) .[name].. 1 By Mouth Three Times A Day  Past History:  Past Surgical History: Cholecystectomy in 1989 Tonsillectomy in 1955 Partial Hysterectomy in 1980 Cosmetic breast surgery in 1989 fusion of c5-7 in 2005 Carpal Tunnel Release on right hand in 2006 Spinal stimulator in 2010-  Family History: Family History Lung Cancer---mother Family History Prostate Cancer---father Rheumatism---PGM Seasonal allergies---sister Family History Asthma---sister Heart disease---father  Social History: Retired Runner, broadcasting/film/video Lives with friend Karen Kays Lives with 9 dogs and 21 cats in the homeAlcohol drinks/day:  0 Smoking Status:  quit Pack years:  21 years x 2 1/2  ppd  Review of Systems       The patient complains of productive cough, non-productive cough, acid heartburn, indigestion, difficulty swallowing, sore throat, tooth/dental problems, headaches, nasal congestion/difficulty breathing through nose, itching, ear ache, anxiety, depression, hand/feet swelling, and joint stiffness or pain.  The patient denies shortness of breath with activity, shortness of breath at rest, coughing up blood, chest pain,  irregular heartbeats, loss of appetite, weight change, abdominal pain, sneezing, rash, change in color of mucus, and fever.    Vital Signs:  Patient profile:   65 year old female Height:      64 inches (162.56 cm) Weight:      214 pounds (97.27 kg) BMI:     36.87 O2 Sat:      94 % on Room air Temp:     98.1 degrees F (36.72 degrees C) oral Pulse rate:   112 / minute BP sitting:   122 / 80  (right arm) Cuff size:   large  Vitals Entered By: Michel Bickers CMA (July 29, 2009 3:43 PM)  O2 Sat at Rest %:  94 O2 Flow:  Room air CC: Pulmonary consult for cough. The patient c/o a chronic cough with clear mucus x5 years but worse x1 year.   Physical Exam  General:  normal appearance, healthy appearing, and obese.   Head:  normocephalic and atraumatic Eyes:  conjunctiva and sclera clear Nose:  no deformity, discharge, inflammation, or lesions Mouth:  posterior pharyngeal erythema Neck:  no masses, thyromegaly, or abnormal cervical nodes Lungs:  clear bilaterally to auscultation and percussion Heart:  regular rate and rhythm, S1, S2 without murmurs, rubs, gallops, or clicks Abdomen:  not examined Extremities:  1 + LE edema Neurologic:  non-focal Skin:  intact without lesions or rashes Psych:  alert and cooperative; normal mood and affect; normal attention span and concentration   Pulmonary Function Test Date: 07/03/2009 Height (in.): 64 Gender: Female  Pre-Spirometry FVC    Value: 2.90 L/min   Pred: 3.05 L/min     % Pred: 95 % FEV1    Value: 2.32 L     Pred: 2.40 L     % Pred: 96 % FEV1/FVC  Value: 80 %     Pred: 78 %     % Pred: - % FEF 25-75  Value: 2.35 L/min   Pred: 2.33 L/min     % Pred: 101 %  Comments: Normal Airflows. RSB  Impression & Recommendations:  Problem # 1:  COUGH (ICD-786.2) With rhinitis and GERD. Skin testing shows not sensitive to cats or dogs although her house is full of animals. Spirometry shows normal airflows.  - CXR now - treat PND with nasal  salline, nasal steroid, loratadine - d/c QVAR in absence of AFL - may be exacerbating cough - consider methacholine challenge in the future if we aren't impacting cough  Problem # 2:  G E R D (ICD-530.81)  Her updated medication list for this problem includes:    Nexium 40 Mg Cpdr (Esomeprazole magnesium) .[name]... 1 by mouth every morning    Prilosec Otc 20 Mg Tbec (Omeprazole magnesium) .[name]... 1 by mouth at night  Problem # 3:  ALLERGIC RHINITIS (ICD-477.9)  Her updated medication list for this problem includes:    Promethazine Hcl 25 Mg Tabs (Promethazine hcl) .[name]... As needed    Loratadine 10 Mg Tabs (Loratadine) .[name]... 1 by mouth once daily    Fluticasone Propionate 50 Mcg/act Susp (Fluticasone propionate) .[name]... 2 sprays each  nostril two times a day  Medications Added to Medication List This Visit: 1)  Ritalin 20 Mg Tabs (Methylphenidate hcl) .[name].. 1 by mouth two times a day 2)  Furosemide 40 Mg Tabs (Furosemide) .[name].. 1 by mouth three times a day 3)  Demerol 50 Mg Tabs (Meperidine hcl) .[name].. 1 by mouth every 3 hours as needed 4)  Promethazine Hcl 25 Mg Tabs (Promethazine hcl) .... As needed 5)  Carbamazepine 100 Mg Chew (Carbamazepine) .[name].. 1 by mouth three times a day 6)  Loratadine 10 Mg Tabs (Loratadine) .[name].. 1 by mouth once daily 7)  Fluticasone Propionate 50 Mcg/act Susp (Fluticasone propionate) .... 2 sprays each nostril two times a day  Other Orders: Consultation Level IV (57846) T-2 View CXR (71020TC) Prescription Created Electronically [phone])  Patient Instructions: 1)  CXR today 2)  Stop your QVAR 3)  Start doing Nasal saline washes once daily  4)  Start loratadine 10mg  by mouth once daily  5)  Start fluticasone nasal spray, 2 sprays each nostril two times a day  6)  Continue your Nexium and Prilosec as you are taking them.  7)  Follow up with Dr Delton Coombes in 1 month  Prescriptions: FLUTICASONE PROPIONATE 50 MCG/ACT SUSP (FLUTICASONE PROPIONATE) 2 sprays each nostril two  times a day  #1 x 5   Entered and Authorized by:   Leslye Peer MD   Signed by:   Leslye Peer MD on 07/29/2009   Method used:   Electronically to        [facility]* (retail)       [address]       [place], [place]  [id number]       Ph: [phone]       Fax: [phone]   RxID:   [id number] LORATADINE 10 MG TABS (LORATADINE) 1 by mouth once daily  #30 x 5   Entered and Authorized by:   Leslye Peer MD   Signed by:   Leslye Peer MD on 07/29/2009   Method used:   Electronically to        [facility]* (retail)       [address]       [place], [place]  [id number]       Ph: [phone]       Fax: [phone]   RxID:   [id number]   Appended Document: cough, allergies    Clinical Lists Changes  Psychiatrist is Dr. Evelene Croon PCP is Dr. Joycelyn Rua at [facility] GYN is Dr. Billy Coast Psychologist is Olegario Messier Carrier at [facility]

## 2010-03-10 NOTE — Miscellaneous (Signed)
Summary: PFT  Clinical Lists Changes  Observations: Added new observation of PFT COMMENTS: Mild AFL, no BD response. Normal volumes. Normal DLCO. RSB (12/17/2009 17:05) Added new observation of DLCO/VA%EXP: 123 % (12/17/2009 17:05) Added new observation of DLCO/VA: 4.56 % (12/17/2009 17:05) Added new observation of DLCO % EXPEC: 92 % (12/17/2009 17:05) Added new observation of DLCO: 23.2 % (12/17/2009 17:05) Added new observation of RV % EXPECT: 86 % (12/17/2009 17:05) Added new observation of RV: 1.60 L (12/17/2009 17:05) Added new observation of TLC % EXPECT: 102 % (12/17/2009 17:05) Added new observation of TLC: 4.95 L (12/17/2009 17:05) Added new observation of PSTFEF25/75P: 2.53  (12/17/2009 17:05) Added new observation of FEV1FVCPRDPS: 73 % (12/17/2009 17:05) Added new observation of FEV1PRDPST: 2.21 L (12/17/2009 17:05) Added new observation of FVCPRDPST: 3.02 L/min (12/17/2009 17:05) Added new observation of FEF2575%EXPS: 70 % (12/17/2009 17:05) Added new observation of POSTFEV1%PRD: 106 % (12/17/2009 17:05) Added new observation of POST FVC%EXP: 104 % (12/17/2009 17:05) Added new observation of PSTFEF25/75%: 1.77 L/min (12/17/2009 17:05) Added new observation of PSTFEV1/FVC: 75 % (12/17/2009 17:05) Added new observation of POST FEV1: 2.35 L/min (12/17/2009 17:05) Added new observation of POST FVC: 3.14 L (12/17/2009 17:05) Added new observation of FEF % EXPEC: 60 % (12/17/2009 17:05) Added new observation of FEF25-75%PRE: 2.53 L/min (12/17/2009 17:05) Added new observation of FEF 25-75%: 1.53 L/min (12/17/2009 17:05) Added new observation of FEV1/FVC PRE: 73 % (12/17/2009 17:05) Added new observation of FEV1/FVC: 71 % (12/17/2009 17:05) Added new observation of FEV1 % EXP: 107 % (12/17/2009 17:05) Added new observation of FEV1 PREDICT: 2.21 L (12/17/2009 17:05) Added new observation of FEV1: 2.36 L (12/17/2009 17:05) Added new observation of FVC % EXPECT: 110 % (12/17/2009  17:05) Added new observation of FVC PREDICT: 3.02 L (12/17/2009 17:05) Added new observation of FVC: 3.32 L (12/17/2009 17:05) Added new observation of PFT DATE: 12/17/2009  (12/17/2009 17:05)  Pulmonary Function Test Date: 12/17/2009 Height (in.): 64 Gender: Female  Pre-Spirometry FVC    Value: 3.32 L/min   Pred: 3.02 L/min     % Pred: 110 % FEV1    Value: 2.36 L     Pred: 2.21 L     % Pred: 107 % FEV1/FVC  Value: 71 %     Pred: 73 %    FEF 25-75  Value: 1.53 L/min   Pred: 2.53 L/min     % Pred: 60 %  Post-Spirometry FVC    Value: 3.14 L/min   Pred: 3.02 L/min     % Pred: 104 % FEV1    Value: 2.35 L     Pred: 2.21 L     % Pred: 106 % FEV1/FVC  Value: 75 %     Pred: 73 %    FEF 25-75  Value: 1.77 L/min   Pred: 2.53 L/min     % Pred: 70 %  Lung Volumes TLC    Value: 4.95 L   % Pred: 102 % RV    Value: 1.60 L   % Pred: 86 % DLCO    Value: 23.2 %   % Pred: 92 % DLCO/VA  Value: 4.56 %   % Pred: 123 %  Comments: Mild AFL, no BD response. Normal volumes. Normal DLCO. RSB

## 2010-03-10 NOTE — Assessment & Plan Note (Signed)
Summary: cough, abnormal CT scan   Copy to:  Dr. Stevphen Rochester Primary Provider/Referring Provider:  Dr. Joycelyn Rua  CC:  1 month followup cough.  Pt states that her cough is "a little better"- she states that she coughs less than before.  She has noticed that cough happens if she talks too much and also gets worse at night.  She c/o hoarsness "all the time".  .  History of Present Illness: 65 yo former smoker, hx documented allergies and rhinitis, chronic cough. Has seen Dr Corinda Gubler regarding allergies and the cough. For the last month the cough has been much worse, happens with talking and exertion. She has been empirically treated with by mouth steroids and also started on inhaled LABA/ICS. Skin testing has shown no sensitivity to cats/dogs although she has 30 animals in her home. Spirometry done 07/03/09 showed normal airflows. She has also been treated for GERD with Nexium + prilosec. She is not on any antihistamines or nasal sprays.   ROV 08/26/09 -- returns to f/u We started [place], nasal steroid, loratadine. She is on Nexium once daily. She still has animals, 13 litter boxes! She is not allergic on skin testing to cats, but she is very sensitive to dusts and molds. Cough may be better but still very limiting. Still with PND even on good regimen  ROV 10/07/09 -- returns for chronic cough and UA symptoms. Referred her to see Dr Maple Hudson for allergy re-eval in setting huge animal exposure at home. Her skin testing was not very impressive, confirmed prob no role for immunotherapy. Dr Maple Hudson ordered CT scan, showed some subtle peri-bronchovascular infiltrate, also showed a nodule that will need to be followed. Retried her on LABA/ICS Elwin Sleight) to see if she'd benefit - she has noticed some benefit. Still with hoarse voice, frequent cough. Dr Maple Hudson raises question of phrenic nerve involvement, which is certainly a possibility. Has been off prednisone x 10 days. GERD symptoms "come and go".   ROV 11/04/09 --  returns for cough. Her HSP panel is negative for fungal sensitivity, IgE and eosinophils normal. Her cough persists, may be a bit better. Able to carry on more conversation, talking does make it worse. Dr Maple Hudson rightly raises possibility of auto-immune process, even BAC.       Current Medications (verified): 1)  Budeprion Sr 150 Mg Xr12h-Tab (Bupropion Hcl) .Marland Kitchen.. 1 By Mouth Three Times A Day 2)  Ritalin 20 Mg Tabs (Methylphenidate Hcl) .Marland Kitchen.. 1 By Mouth Two Times A Day 3)  Furosemide 40 Mg Tabs (Furosemide) .Marland Kitchen.. 1 By Mouth Three Times A Day 4)  Nexium 40 Mg Cpdr (Esomeprazole Magnesium) .Marland Kitchen.. 1 By Mouth Every Morning 5)  Prilosec Otc 20 Mg Tbec (Omeprazole Magnesium) .Marland Kitchen.. 1 By Mouth At Night 6)  Multivitamins  Tabs (Multiple Vitamin) .Marland Kitchen.. 1 By Mouth Daily 7)  Potassium Gluconate 595 Mg Tabs (Potassium Gluconate) .Marland Kitchen.. 1 By Mouth Two Times A Day 8)  Lovastatin 40 Mg Tabs (Lovastatin) .... Take 1 By Mouth Once Daily 9)  Cyclobenzaprine Hcl 10 Mg Tabs (Cyclobenzaprine Hcl) .... 2 By Mouth At Bedtime 10)  Lamictal 100 Mg Tabs (Lamotrigine) .Marland Kitchen.. 1 By Mouth At Bedtime 11)  Alprazolam 1 Mg Tabs (Alprazolam) .... 2 By Mouth At Bedtime;take 1 As Needed During Day 12)  Mirapex 0.5 Mg Tabs (Pramipexole Dihydrochloride) .Marland Kitchen.. 1 By Mouth At Bedtime 13)  Lyrica 100 Mg Caps (Pregabalin) .Marland Kitchen.. 1 By Mouth At Bedtime 14)  Ibuprofen 200 Mg Caps (Ibuprofen) .... As Needed 15)  Fioricet 50-325-40 Mg Tabs (Butalbital-Apap-Caffeine) .... 2 By Mouth As Needed For Migraines 16)  Antihistamine Decongestant 2.5-60 Mg Tabs (Triprolidine-Pseudoephedrine) .... As Needed 17)  Melatonin 300 Mcg Tabs (Melatonin) .Marland Kitchen.. 1 By Mouth At Bedtime 18)  Zonisamide 100 Mg Caps (Zonisamide) .... 2 By Mouth As Directed 19)  Demerol 50 Mg Tabs (Meperidine Hcl) .Marland Kitchen.. 1 By Mouth Every 3 Hours As Needed 20)  Promethazine Hcl 25 Mg Tabs (Promethazine Hcl) .... As Needed 21)  Loratadine 10 Mg Tabs (Loratadine) .Marland Kitchen.. 1 By Mouth Once Daily 22)   Fluticasone Propionate 50 Mcg/act Susp (Fluticasone Propionate) .... 2 Sprays Each Nostril Two Times A Day 23)  Dulera 100-5 Mcg/act Aero (Mometasone Furo-Formoterol Fum) .... 2 Puffs Two Times A Day 24)  Vitamin D 1000 Unit Tabs (Cholecalciferol) .Marland Kitchen.. 1 By Mouth Daily 25)  Calcium 600 Mg Tabs (Calcium) .... 2 By Mouth Daily 26)  Delsym 30 Mg/7ml Lqcr (Dextromethorphan Polistirex) .... As Directed 27)  Coq10 100 Mg Caps (Coenzyme Q10) .... Once Daily 28)  Abilify 10 Mg Tabs (Aripiprazole) .... Once Daily 29)  Bepreve 1.5 % Soln (Bepotastine Besilate) .... As Directed Daily 30)  Docusate Calcium 240 Mg Caps (Docusate Calcium) .... Take 1 By Mouth Weekly 31)  Sinus Rinse Kit  Pack (Hypertonic Nasal Wash) .Marland Kitchen.. 1 Time Daily 32)  Fluorometholone 0.1 % Susp (Fluorometholone) .Marland Kitchen.. 1 Drop in Each Eye Two Times A Day As Needed 33)  Pennsaid 1.5 % Soln (Diclofenac Sodium) .Marland Kitchen.. 1 Time A Day  Allergies (verified): 1)  ! * Lithium  Vital Signs:  Patient profile:   65 year old female Weight:      235 pounds O2 Sat:      96 % on Room air Temp:     98.2 degrees F oral Pulse rate:   81 / minute BP sitting:   116 / 74  (left arm) Cuff size:   large  Vitals Entered By: Vernie Murders (November 04, 2009 3:46 PM)  O2 Flow:  Room air  Physical Exam  General:  normal appearance, healthy appearing, and obese. Coughing frequently, difficulty talking due to cough  Head:  normocephalic and atraumatic Eyes:  conjunctiva and sclera clear Nose:  no deformity, discharge, inflammation, or lesions Mouth:  posterior pharyngeal erythema Neck:  no masses, thyromegaly, or abnormal cervical nodes Lungs:  clear bilaterally to auscultation and percussion Heart:  regular rate and rhythm, S1, S2 without murmurs, rubs, gallops, or clicks Abdomen:  not examined Extremities:  1 + LE edema Neurologic:  non-focal Skin:  intact without lesions or rashes Psych:  alert and cooperative; normal mood and affect; normal  attention span and concentration   Medications Added to Medication List This Visit: 1)  Fluorometholone 0.1 % Susp (Fluorometholone) .Marland Kitchen.. 1 drop in each eye two times a day as needed  Other Orders: Est. Patient Level IV (38756) Radiology Referral (Radiology)  Patient Instructions: 1)  Continue your nexium and prilosec 2)  Continue the [facility] two times a day  3)  Continue fluticasone spray and loratadine.  4)  Continue your eyedrops as ordered 5)  We will repeat your CT scan of the chest in December 2011.  6)  Follow up with Dr Delton Coombes in December to review your CT scan.  Prescriptions: FLUOROMETHOLONE 0.1 % SUSP (FLUOROMETHOLONE) 1 drop in each eye two times a day as needed  #1 x 11   Entered and Authorized by:   Leslye Peer MD   Signed by:   Les Pou  Byrum MD on 11/04/2009   Method used:   Electronically to        [id number] (retail)       [address]       [place], [place]  [address]       Ph: [phone] or [phone]       Fax: [phone]   RxID:   [id number] BEPREVE 1.5 % SOLN (BEPOTASTINE BESILATE) as directed daily  #1 x 11   Entered and Authorized by:   Leslye Peer MD   Signed by:   Leslye Peer MD on 11/04/2009   Method used:   [facility]ronically to        [phone][facility] (retail)       [address] [place], [place]  [address]       Ph: [phone] or [phone]       Fax: [phone]   RxID:   [id number] DULERA 100-5 MCG/ACT AERO (MOMETASONE FURO-FORMOTEROL FUM) 2 puffs two times a day  #1 x 11   Entered and Authorized by:   Leslye Peer MD   Signed by:   Leslye Peer MD on 11/04/2009   Method used:   [facility]        [facility]  [address] [facility] [phone] ([facility]ail[facility]       [address] [place], [place]  [address]       Ph: [phone] or [phone]       Fax: [phone]   RxID:   [id number]

## 2010-03-10 NOTE — Progress Notes (Signed)
  Phone Note Other Incoming   Request: Send information Summary of Call: Request for records received from [facility]. Request forwarded to [employer].

## 2010-03-10 NOTE — Letter (Signed)
Summary: [facility]  [facility]   Imported By: Sherian Rein 09/24/2009 15:19:51  _____________________________________________________________________  External Attachment:    Type:   Image     Comment:   External Document

## 2010-03-10 NOTE — Letter (Signed)
Summary: Medication List/[facility]  Medication List/[facility]   Imported By: Sherian Rein 07/31/2009 09:27:28  _____________________________________________________________________  External Attachment:    Type:   Image     Comment:   External Document

## 2010-03-12 ENCOUNTER — Other Ambulatory Visit: Payer: Self-pay | Admitting: Neurosurgery

## 2010-03-12 ENCOUNTER — Ambulatory Visit
Admission: RE | Admit: 2010-03-12 | Discharge: 2010-03-12 | Disposition: A | Discharge status: Home / Self Care | Payer: Medicare Other | Source: Ambulatory Visit | Attending: Neurosurgery | Admitting: Neurosurgery

## 2010-03-12 DIAGNOSIS — Z9889 Other specified postprocedural states: Secondary | ICD-10-CM

## 2010-03-12 DIAGNOSIS — Z09 Encounter for follow-up examination after completed treatment for conditions other than malignant neoplasm: Secondary | ICD-10-CM

## 2010-03-12 NOTE — Progress Notes (Signed)
Summary: wheezing/ cough > can't treat over the phone  Phone Note Call from Patient   Caller: Patient Call For: byrum Summary of Call: pt c/o wheezing/ coughing (non-productive "as always per pt". T 101. this began last night. pt is due to have "major back surgery next tues. and requests rx to knock this out [facility]ap. [facility] [place] pt # [id number] Initial call taken by: Tivis Ringer, CNA,  January 30, 2010 10:26 AM  Follow-up for Phone Call        Spoke with pt and sheis c/o having a "crackiling" in her lungs, increased SOB, dry cough and a temp of 100.7. the pt is concerned because she has back surgery scheduled for Tuesdy and this has been r/s x3 times and she does not wnat it to happen again. Pt requesting an rx for abx and anything else MD recs. Please advise.Carron Curie CMA  January 30, 2010 11:00 AM she's on 36 medications per [facility] and it is not presribe over the phone anything that is safe and effective in this setting, may need to go to urgent care or see her primary or go to er in interim if condition worsens   Follow-up by: Nyoka Cowden MD,  January 30, 2010 12:39 PM  Additional Follow-up for Phone Call Additional follow up Details #1::        pt advised of recs to go to Urgent care, PCP or ER to be evaluated.Carron Curie CMA  January 30, 2010 12:44 PM

## 2010-03-13 NOTE — Letter (Signed)
Summary: [facility]  [facility]   Imported By: Sherian Rein 07/31/2009 09:31:12  _____________________________________________________________________  External Attachment:    Type:   Image     Comment:   External Document

## 2010-03-13 NOTE — Miscellaneous (Signed)
Summary: Skin Test/[facility]  Skin Test/[facility]   Imported By: Sherian Rein 07/31/2009 09:25:08  _____________________________________________________________________  External Attachment:    Type:   Image     Comment:   External Document

## 2010-04-02 ENCOUNTER — Telehealth (INDEPENDENT_AMBULATORY_CARE_PROVIDER_SITE_OTHER): Payer: Self-pay | Admitting: *Deleted

## 2010-04-16 NOTE — Progress Notes (Signed)
Summary: rx req- neb  Phone Note From Pharmacy   Caller: seira w/ [facility] Call For: byrum  Summary of Call: reuests a rx for nebulizor. fax [phone]. contact # [phone] Initial call taken by: Tivis Ringer, CNA,  April 02, 2010 9:17 AM  Follow-up for Phone Call        Spoke with [place]. She states that pt is requesting rx for nebs.  She states that the pt told her that RB order her a nebulizer machine back in Dec 2011 and never gave any neb meds.  I advised that her records do not show where this was done.  I will forward to RB to see if he wanted her on nebs or not.  Pls advise thanks Follow-up by: Vernie Murders,  April 02, 2010 10:40 AM  Additional Follow-up for Phone Call Additional follow up Details #1::        No record that RB ever ordered nebulizer for this patient.  Patient will need OV to discuss.Michel Bickers CMA  April 09, 2010 9:57 AM    Additional Follow-up for Phone Call Additional follow up Details #2::    I called and spoke with pt and she states that she has never used neb meds and has never requested meds or machine.  She states doing well and nothing needed at this time.  I called [place] at [facility] and informed. She verbalized understanding.  Follow-up by: Vernie Murders,  April 09, 2010 10:17 AM

## 2010-04-20 LAB — CBC
Hemoglobin: 12.5 g/dL (ref 12.0–15.0)
MCHC: 32.6 g/dL (ref 30.0–36.0)
RBC: 4.42 MIL/uL (ref 3.87–5.11)
WBC: 7.4 10*3/uL (ref 4.0–10.5)

## 2010-04-20 LAB — BASIC METABOLIC PANEL
Calcium: 8.4 mg/dL (ref 8.4–10.5)
GFR calc Af Amer: >60 mL/min (ref >60)
GFR calc non Af Amer: >60 mL/min (ref >60)
Glucose, Bld: 91 mg/dL (ref 70–99)
Sodium: 139 mEq/L (ref 135–145)

## 2010-04-20 LAB — DIFFERENTIAL
Basophils Relative: 1 % (ref 0–1)
Monocytes Relative: 7 % (ref 3–12)
Neutro Abs: 4.4 10*3/uL (ref 1.7–7.7)
Neutrophils Relative %: 60 % (ref 43–77)

## 2010-04-20 LAB — TYPE AND SCREEN
ABO/RH(D): O POS
ABO/RH(D): O POS
Antibody Screen: NEGATIVE

## 2010-04-20 LAB — SURGICAL PCR SCREEN: MRSA, PCR: NEGATIVE

## 2010-04-20 LAB — ABO/RH: ABO/RH(D): O POS

## 2010-04-23 LAB — FUNGUS CULTURE W SMEAR: Fungal Smear: NONE SEEN

## 2010-04-23 LAB — CULTURE, RESPIRATORY W GRAM STAIN

## 2010-04-23 LAB — AFB CULTURE WITH SMEAR (NOT AT ARMC)

## 2010-04-26 LAB — POCT I-STAT, CHEM 8
BUN: 10 mg/dL (ref 6–23)
Calcium, Ion: 1.06 mmol/L — ABNORMAL LOW (ref 1.12–1.32)
Chloride: 108 mEq/L (ref 96–112)
Glucose, Bld: 96 mg/dL (ref 70–99)

## 2010-04-26 LAB — DIFFERENTIAL
Lymphocytes Relative: 26 % (ref 12–46)
Lymphs Abs: 2.6 10*3/uL (ref 0.7–4.0)
Monocytes Absolute: 0.6 10*3/uL (ref 0.1–1.0)
Monocytes Relative: 6 % (ref 3–12)
Neutro Abs: 6.6 10*3/uL (ref 1.7–7.7)

## 2010-04-26 LAB — CBC
HCT: 40.5 % (ref 36.0–46.0)
Hemoglobin: 13.8 g/dL (ref 12.0–15.0)
RBC: 4.51 MIL/uL (ref 3.87–5.11)
WBC: 10 10*3/uL (ref 4.0–10.5)

## 2010-04-26 LAB — POCT CARDIAC MARKERS
CKMB, poc: 3 ng/mL (ref 1.0–8.0)
Troponin i, poc: <0.05 ng/mL (ref 0.00–0.09)

## 2010-05-19 LAB — BASIC METABOLIC PANEL
Chloride: 111 mEq/L (ref 96–112)
Creatinine, Ser: 1 mg/dL (ref 0.4–1.2)
GFR calc Af Amer: >60 mL/min (ref >60)
GFR calc non Af Amer: 56 mL/min — ABNORMAL LOW (ref >60)
Potassium: 3.3 mEq/L — ABNORMAL LOW (ref 3.5–5.1)

## 2010-05-19 LAB — CBC
MCV: 86.7 fL (ref 78.0–100.0)
RBC: 4.95 MIL/uL (ref 3.87–5.11)
WBC: 8.7 10*3/uL (ref 4.0–10.5)

## 2010-05-19 LAB — DIFFERENTIAL
Eosinophils Absolute: 0.2 10*3/uL (ref 0.0–0.7)
Lymphocytes Relative: 23 % (ref 12–46)
Lymphs Abs: 2 10*3/uL (ref 0.7–4.0)
Monocytes Relative: 9 % (ref 3–12)
Neutrophils Relative %: 64 % (ref 43–77)

## 2010-05-19 LAB — PROTIME-INR
INR: 2.6 — ABNORMAL HIGH (ref 0.00–1.49)
Prothrombin Time: 29.9 seconds — ABNORMAL HIGH (ref 11.6–15.2)

## 2010-05-20 ENCOUNTER — Telehealth: Payer: Self-pay | Admitting: Emergency Medicine

## 2010-05-20 DIAGNOSIS — R05 Cough: Secondary | ICD-10-CM

## 2010-05-20 NOTE — Telephone Encounter (Signed)
She started running a fever of 102 she felt like a wet noodle now the cough is back she treated fever with aspirin but still has the cough she is still taking all of the medicines and using the inhailers. She isn't sure if this will clear up on its own or if she needs antibiotic. She is having a hard time breathing in deeply. She can be reached at [phone]

## 2010-05-20 NOTE — Telephone Encounter (Signed)
Pt is going to come in 4/13 at 3:30 to see RB and is aware she will have cxr rpior to OV

## 2010-05-20 NOTE — Telephone Encounter (Signed)
I agree that she needs to be seen. In meantime make sure she is taking all of her allergy regimen as per our last visit. Please have her make OV with a CXR.

## 2010-05-20 NOTE — Telephone Encounter (Signed)
Spoke w/ pt and she states she has a deep dry cough. Pt states nothing is coming up. Pt also stated she has been running a fever of 102 but is not now. Pt has also had an increase SOB and is using her proair but feels no relief. Pt has been taking delsym for her cough every 12 hrs. Offered apt for pt to come in today or tomorrow. Pt states she could not bc she has 2 apts' tomorrow and pt states she can't come in until Monday. Please advise recs for pt Dr. Delton Coombes. Thanks  Allergies  Allergen Reactions  . Lithium        Carver Fila, CMA

## 2010-05-22 ENCOUNTER — Ambulatory Visit (INDEPENDENT_AMBULATORY_CARE_PROVIDER_SITE_OTHER)
Admission: RE | Admit: 2010-05-22 | Discharge: 2010-05-22 | Disposition: A | Discharge status: Home / Self Care | Payer: Medicare Other | Source: Ambulatory Visit | Attending: Emergency Medicine | Admitting: Emergency Medicine

## 2010-05-22 ENCOUNTER — Encounter: Payer: Self-pay | Admitting: Internal Medicine

## 2010-05-22 ENCOUNTER — Ambulatory Visit (INDEPENDENT_AMBULATORY_CARE_PROVIDER_SITE_OTHER): Payer: Medicare Other | Admitting: Emergency Medicine

## 2010-05-22 ENCOUNTER — Encounter: Payer: Self-pay | Admitting: Emergency Medicine

## 2010-05-22 DIAGNOSIS — R05 Cough: Secondary | ICD-10-CM

## 2010-05-22 DIAGNOSIS — J309 Allergic rhinitis, unspecified: Secondary | ICD-10-CM

## 2010-05-22 DIAGNOSIS — R059 Cough, unspecified: Secondary | ICD-10-CM

## 2010-05-22 NOTE — Assessment & Plan Note (Signed)
Stop Dulera for a week to see if this helps cough subside, then restart

## 2010-05-22 NOTE — Progress Notes (Signed)
  Subjective:    Patient ID: [name], female    DOB: [birth year], 65 y.o.   MRN: [id number]  HPI 65 yo former smoker, hx documented allergies and rhinitis, chronic cough. Has seen Dr Corinda Gubler regarding allergies and the cough. For the last month the cough has been much worse, happens with talking and exertion. She has been empirically treated with by mouth steroids and also started on inhaled LABA/ICS. Skin testing has shown no sensitivity to cats/dogs although she has 30 animals in her home. Spirometry done 07/03/09 showed normal airflows. She has also been treated for GERD with Nexium + prilosec.   ROV 11/04/09 -- returns for cough. Her HSP panel is negative for fungal sensitivity, IgE and eosinophils normal. Her cough persists, may be a bit better. Able to carry on more conversation, talking does make it worse. Dr Maple Hudson rightly raises possibility of auto-immune process, even BAC.   ROV 12/03/09 -- chronic cough, chronic rhinitis, normal AF on spirometry. She was admitted 10/9-10/12 to [facility] with fever, lightheadedness, dyspnea, cough was actually better. Treated for ? COPD exac or PNA. CT scan without infiltrates (per d/c summary). Cleda Daub 5/11 normal. She believes that the [facility] helps her. Her cough is better. 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   ROV 12/25/09 -- chronic cough, rhinitis, abnormal CT scan (? mild nodular infiltrate, 09/2009). Repeat scan done at [facility] in 10/11, need to review and compare. Initial plan has been to repeat scan in Dec. Has had PFT since last visit, here to review. 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 Her exposure profile at home is very high - dusts, litter etc. She is on [place], sometimes coughs after it. She believes that the [name] is helping her breathing.  ROV 05/22/10 -- returns for eval severe allergies, POSSIBLE AFL, on Dulera + flonase, antivert, loratadine, omeprazole, cough  suppressants, nasal saline washes. Her cough has been worse over the last week, 1 week ago she had fever, chills. Some hoarse voice.   CXR today shows no significant disease.   Review of Systems As per HPI    Objective:   Physical Exam Gen: Pleasant, obese, in no distress,  normal affect  ENT: No lesions,  mouth clear,  oropharynx clear, no postnasal drip  Neck: No JVD, no TMG, no carotid bruits  Lungs: No use of accessory muscles, no dullness to percussion, clear without rales or rhonchi  Cardiovascular: RRR, heart sounds normal, no murmur or gallops, no peripheral edema  Musculoskeletal: No deformities, no cyanosis or clubbing  Neuro: alert, non focal  Skin: abrasions on R UE, healing without evidence cellulitis           Assessment & Plan:  COUGH Stop Dulera for a week to see if this helps cough subside, then restart  ALLERGIC RHINITIS Same regimen

## 2010-05-22 NOTE — Assessment & Plan Note (Signed)
Same regimen.  

## 2010-05-22 NOTE — Patient Instructions (Signed)
Please stay on your usual allergy regimen - nasal saline washes, loratadine, fluticasone nasal spray, OTC decongestants, cough suppressants Continue your omeprazole daily Stop Dulera for a week to see if this helps your cough, then restart Your CXR today looks normal.  Follow up with Dr Delton Coombes in 3 months or as needed

## 2010-06-02 ENCOUNTER — Ambulatory Visit: Payer: Medicare Other | Admitting: Emergency Medicine

## 2010-06-23 NOTE — Consult Note (Signed)
NEW PATIENT CONSULTATION   [name], [name]  DOB:  Nov 20, 1945                                       06/14/2008  GMWNU#:[id number]   The patient presents today for follow-up of her left leg DVT.  She had a  spinal stimulator implant in January and had some swelling  postoperatively.  She had a duplex on 04/09/2008 of her left leg showing  left femoral vein DVT and was treated with heparin and Coumadin.  She  was discharged home on 04/16/2008.  Since that time, she has had some  difficulty in regulating her Coumadin and is up to 12 mg to maintain INR  greater than 2.  She does continue to have bilateral leg swelling,  possibly slightly worse on the left than on the right.  She does have  some popliteal area discomfort.  But she had no prior history of DVT  prior to this and no prior cardiac difficulties.  She does not have any  history of pulmonary embolus.  She has multiple medical problems to  include fibromyalgia, chronic venous stasis disease, migraines, bipolar  disorder.  She is on multiple medications including 20 mg of Lasix  daily.  Her medication list is attached.  She is allergic to codeine and  lithium.   SOCIAL HISTORY:  She is a recovering alcoholic, sober for 21 years.  Quit smoking in 1988.   REVIEW OF SYSTEMS:  Multiply positive for weight gain, esophageal  reflux, constipation, leg pain with walking, headache, arthritis joint  and muscle pain.   PHYSICAL EXAMINATION:  A well-developed, moderately obese white female  appearing stated age of 82.  Blood pressure is 138/84, pulse 87,  respirations 18, her radial pulses are 2+.  She has 2+ dorsalis pedis  pulses bilaterally.  She does not have any evidence of varicose veins  and does have a few scattered telangiectasia on both legs.  She does not  have any marked swelling today.  She does not have any tenderness.   I do have her duplex from [facility] to review.  This showed clot in her  mid thigh  femoral vein.  She did not have any evidence of clot in her  right leg.  I had a long discussion with the patient and her friend  present.  I explained that there is no surgical treatment for her clot,  and explained that this acute DVT could be addressed with thrombolysis  but certainly would have recommended against at the location of her  femoral vein DVT.  I explained that I feel that her treatment has been  completely appropriate and would agree with Dr. Darlin Drop plan for 6  months of Coumadin therapy.  I would recommend discontinuing at this  time.  I explained that there is no specific need for follow-up duplex  unless she should develop new symptoms, since we would recommend  discontinuation of Coumadin at 6 months with or without recanalization.  I did explain that with her thrombus that she will have progressive  valvular dysfunction and could experience worsening prolonged swelling  in her left leg.  I did explain the option of compression garments.  She  is interested in trying this and we have fitted her with 20-30 mmHg knee-  high compression.  I feel that it is safe for her to Resume full  activity without limitation now that she is 2-1/2 months out from her  initial DVT.  She was comfortable with this discussion and will see [place]  again on an as-needed basis.   Larina Earthly, M.D.  Electronically Signed   TFE/MEDQ  D:  06/14/2008  T:  06/17/2008  Job:  [id number]   cc:   Joycelyn Rua, M.D.

## 2010-06-26 NOTE — Procedures (Signed)
[facility]  Patient:    [name], [name] Visit Number: [id number] MRN: [id number]          Service Type: END Location: ENDO Attending Physician:  Louie Bun Dictated by:   Everardo All Madilyn Fireman, M.D. Proc. Date: 07/31/01 Admit Date:  07/31/2001   CC:         Heather Roberts, M.D.   Procedure Report  PROCEDURE:  Colonoscopy.  INDICATION FOR PROCEDURE:  Occasional rectal bleeding and chronic constipation.  DESCRIPTION OF PROCEDURE:  The patient was placed in the left lateral decubitus position and placed on the pulse monitor with continuous low-flow oxygen delivered by nasal cannula.  She was sedated with 70 mg of IV Demerol and 12.5 mg of IV Phenergan.  The Olympus video colonoscope was inserted into the rectum and advanced to the cecum, confirmed by transillumination at McBurneys point and visualization of the ileocecal valve and appendiceal orifice.  The prep was excellent.  The cecum, ascending, transverse, descending, and sigmoid colon all appeared normal with no masses, polyps, diverticula or other mucosal abnormalities.  The rectum likewise appeared normal, and retroflex view of the anus did reveal some small internal hemorrhoids.  The colonoscope was then withdrawn, and the patient returned to the recovery room in stable condition.  She tolerated the procedure well, and there were no immediate complications.  IMPRESSION: 1. Small internal hemorrhoids. 2. Otherwise normal colonoscopy.  PLAN: 1. Sigmoidoscopy in five years. 2. Treat hemorrhoids symptomatically. Dictated by:   Everardo All Madilyn Fireman, M.D. Attending Physician:  Louie Bun DD:  07/31/01 TD:  08/01/01 Job: [id number]

## 2010-06-26 NOTE — Procedures (Signed)
[facility]  Patient:    [name], [name]                        MRN: [id number] Proc. Date: 08/14/99 Adm. Date:  [id number] Attending:  Thyra Breed CC:         Julio Sicks, M.D.                           Procedure Report  PROCEDURE: Lumbar epidural steroid injection.  DIAGNOSIS:  S1 radiculopathy and underlying degenerative disk disease with history of herniated disk.  INTERVAL HISTORY:  The patients noted that she continues to have pain radiating out into the left lower extremity in the sciatic nerve distribution. She also occasionally had some weakness of her left foot. She tolerated the first injection well.  PHYSICAL EXAMINATION:  VITAL SIGNS:  Blood pressure is 103/64, heart rate is 75, respiratory rate 18, O2 saturations 95%, pain level 8/10 and temperature is 96.7.  BACK:  Shows good healing from previous injection site. Straight leg raise signs are significant for left lower extremity discomfort in the sciatic distribution.  NEUROLOGIC:  Otherwise unchanged.  DESCRIPTION OF PROCEDURE:  After informed consent was obtained, the patient was placed in the sitting position and monitored. The patients back was prepped with Betadine x 3. A skin wheal was raised at the L4-5 interspace with 1 percent lidocaine. A 20 gauge Tuohy needle was introduced to the lumbar epidural space to loss of resistance to preservative free normal saline. There was no cerebrospinal fluid nor blood. 40 mg of Medrol and 8 ml of preservative free normal saline was gently injected. The needle was flushed with preservative free normal saline and removed intact.  CONDITION POST PROCEDURE:  Stable.  DISCHARGE INSTRUCTIONS:  Resume previous diet. Limitations in activities per instruction sheet. Continue on current medications. Follow-up with me next week for a third injection. DD:  08/14/99 TD:  08/14/99 Job: [id number]

## 2010-06-26 NOTE — Procedures (Signed)
[facility]  Patient:    [name], [name]                        MRN: [id number] Proc. Date: 08/19/00 Adm. Date:  [id number] Attending:  Thyra Breed CC:         Julio Sicks, M.D.  Heather Roberts, M.D.   Procedure Report  PROCEDURE:  Lumbar epidural steroid injection.  DIAGNOSIS:   S1 radiculopathy with underlying lumbar degenerative disk disease.  ANESTHESIOLOGIST:  Thyra Breed, M.D.  INTERVAL HISTORY:  The patient noted that about four months after her last injection, she began to get symptomatic, but she had not come in until now because she just wanted to put it off for the time being.  She describes a searing pain to the left lower extremity along the posterior aspect of the foot and to a lesser extent in her back.  It is a constant pain made worse by activity and improved by rest.  MEDICATIONS:  Unchanged from last visit.  PHYSICAL EXAMINATION:  VITAL SIGNS:  Blood pressure 117/61, heart rate 70, respiratory rate 16, O2 saturation 97%, pain level 6/10.  NEUROLOGIC:  She demonstrates deep tendon reflexes symmetric at the knees, absent at the left ankle, 1 to 2+ at the right ankle.  Motor is unchanged from her last visit with 5/5 strength.  Straight leg signs were negative.  DESCRIPTION OF PROCEDURE:  After informed consent was obtained, the patient was placed in the sitting position and monitored.  Her back was prepped with Betadine x 3.  A skin wheal was raised at the L4-5 interspace with 1% lidocaine.  A 20-gauge Tuohy needle was introduced in the lumbar epidural space to loss of resistance to preservative free normal saline.  The depth was 4 cm, and there was no CSF nor blood.  I injected 40 mg of Medrol and 8 ml preservative free normal saline.  The needle was flushed and removed intact.  POSTPROCEDURE CONDITION:  Stable.  DISCHARGE INSTRUCTIONS: 1. Resume previous diet. 2. Limitation of activities per instruction sheet. 3.  Continue on current medications. 4. Follow up with me in one to two weeks for repeat injection. DD:  08/19/00 TD:  08/19/00 Job: [id number]

## 2010-06-26 NOTE — Op Note (Signed)
NAMEKIERSTYNN, BABICH NO.:  [id number]   MEDICAL RECORD NO.:  [id number]                   PATIENT TYPE:  OIB   LOCATION:  2899                                 FACILITY:  [facility]   PHYSICIAN:  Kathaleen Maser. Pool, M.D.                 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   OPERATION PERFORMED:  C5-6 and C6-7 anterior cervical diskectomy and fusion  with allograft and anterior plating.   SURGEON:  Kathaleen Maser. Pool, M.D.   ASSISTANT:  Donalee Citrin, M.D.   ANESTHESIA:  General endotracheal.   INDICATIONS FOR PROCEDURE:  The patient is a 65 year old female who is  status post previous C4-5 anterior cervical diskectomy and fusion remotely  in the past.  The patient presents now with bilateral upper extremity  numbness, paresthesias and weakness consistent with a cervical myelopathy.  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  The patient has  been counseled as to her options.  She has decided to proceed with two level  anterior cervical diskectomy and fusion with allograft and anterior plating.   DESCRIPTION OF PROCEDURE:  The patient was taken to the operating room and  placed on the table in supine position.  After adequate level of general  anesthesia was achieved, the patient was positioned with the neck slightly  extended and held in place with halter traction.  The patient's anterior  cervical region was prepped and draped sterilely.  A 10 blade was used to  make a linear skin incision overlying the C5-6 level.  This was carried down  sharply to the platysma.  The platysma was then divided vertically and  dissection proceeded along the medial border of the  sternocleidomastoid  muscle and carotid sheath.  Trachea and esophagus were mobilized and  retracted toward the left.  Prevertebral fascia was stripped off the  anterior spinal column.  The longus colli muscles were then elevated  bilaterally.  Deep self-retaining retractor was placed.  Intraoperative  fluoroscopy was used and the C5-6 and C6-7 levels were confirmed.  Anterior  osteophytes were removed at both levels using Leksell rongeurs.  The disk  spaces were then incised with a 15 blade in rectangular fashion.  A wide  disk space cleanout was then achieved using pituitary rongeurs, forward and  backward angled Carlens curets, Kerrison rongeurs and a high speed drill.  All elements of the disk were removed down to the posterior annulus at both  levels.  Microscope was brought into the field and used throughout the  remainder of the diskectomy.  Starting first at C5-6, the remaining aspects  of annulus and  osteophytes were removed using a high speed drill down to the  level of the posterior longitudinal ligament.  The posterior longitudinal  ligament was then elevated and resected in piecemeal fashion using Kerrison  rongeurs.  A wide central decompression was then performed by undercutting  the bodies of C5 and C6 using Kerrison rongeurs.  Decompression then  proceeded out to each neural foramen.  Wide decompressive foraminotomies  were performed along the course of exiting nerve roots bilaterally.  After a  very thorough decompression was achieved, the wound was inspected.  There  was no evidence of any residual stenosis.  There was no evidence of any  decompression of the thecal sac or nerve roots.  The procedure was then  repeated at C6-7 again without complication.  The wound was then irrigated  with antibiotic solution. Gelfoam was placed topically for hemostasis, which  was found to be good.  Disk spaces were distracted and a 6 mm patellar wedge  allograft was then impacted into  place and recessed approximately 1 mm from  the anterior cortical margin at C5-6 and a 7 mm graft was placed at C6-7.  A  40 mm Atlantis anterior cervical plate was then placed over the C5, C6 and  C7 levels.  This was then attached under fluoroscopic guidance using 13 mm  variable angle screws.  All six screws were given a final tightening and  found to be solidly within bone.  Locking screws were engaged at all three  levels.  Final images revealed good position of bone graft and hardware at  proper operative level with normal alignment of the spine.  The wound was  then irrigated one final time.  It was inspected for hemostasis which was  found to be good.  The wound was then closed in typical fashion.  Steri-  Strips and sterile dressing were applied.  There were no apparent  complications.  The patient tolerated the procedure well and returned to the  recovery room postoperatively.                                               Henry A. Pool, M.D.    HAP/MEDQ  D:  03/18/2003  T:  03/19/2003  Job:  [id number]

## 2010-06-26 NOTE — Procedures (Signed)
[facility]  Patient:    [name], [name]                        MRN: [id number] Proc. Date: 08/29/00 Adm. Date:  [id number] Attending:  Thyra Breed                           Procedure Report  No dictation. DD:  08/29/00 TD:  08/29/00 Job: [id number]

## 2010-06-26 NOTE — Procedures (Signed)
[facility]  Patient:    [name], [name]                        MRN: [id number] Proc. Date: 09/06/00 Adm. Date:  [id number] Attending:  Thyra Breed CC:         Julio Sicks, M.D.  Heather Roberts, M.D.   Procedure Report  PROCEDURE:  Lumbar epidural steroid injection.  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  INTERVAL HISTORY:  The patients noted minimal improvements after her first two injections. Nevertheless, she requested a third today.  PHYSICAL EXAMINATION:  Blood pressure is 83/56, heart rate 62, respiratory rate 16, O2 saturations 99%, and pain level is 6/10. Her back shows good healing from previous injection site.  DESCRIPTION OF PROCEDURE:  After informed consent was obtained, the patient was placed in the sitting position and monitored. The patients back was prepped with Betadine x 3. A skin wheal was raised at the L5-S1 interspace with 1 percent lidocaine. A 20 gauge Tuohy needle was introduced to the lumbar epidural space to loss of resistance to preservative free normal saline. There was no cerebrospinal fluid nor blood. 80 mg of Medrol and 8 ml of preservative free normal saline was gently injected. The needle was flushed and removed intact.  CONDITION POST PROCEDURE:  Stable.  DISCHARGE INSTRUCTIONS:  Resume previous diet. Limitations in activities per instruction sheet as outlined by my assistant today. Continue on current medications. Follow-up with me in 4-6 months for a repeat series of lumbar epidural steroid injections. DD:  09/06/00 TD:  09/06/00 Job: [id number]

## 2010-06-26 NOTE — Procedures (Signed)
[facility]  Patient:    [name], [name]                        MRN: [id number] Proc. Date: 08/05/99 Adm. Date:  [id number] Attending:  Thyra Breed CC:         Lum Babe, M.D.             Julio Sicks, M.D.                           Procedure Report  PROCEDURE:  Lumbar epidural steroid injection.  DIAGNOSIS: S1 radiculopathy into the left lower extremity with history of herniated disk in the past with underlying disk disease.  SURGEON: Thyra Breed, M.D.  The patient received a series of epidural steroid injections last summer. Since then, she noted that her pain began recurring in November and it has gotten progressively worse. Nevertheless, she continues to be in good spirits and continues to maintain 30 animals at home. Her pain is made worse by lifting and twisting and it is improved by rest and laying down. At times, when she lays down, she has no pain even radiating out into her lower extremities. She does have numbness and tingling to the left lower extremity and occasionally weakness of the left lower extremity.  MEDICATIONS:  Wellbutrin, carbamazepine, cyclobenzaprine, Klonopin, Premarin, Xanax, triamterene, ibuprofen, propoxyphene p.r.n., Prilosec, Nasacort, Verapamil, Glucosamine and calcium.  PHYSICAL EXAMINATION:  VITAL SIGNS:  Blood pressure is 126/67, heart rate 65, respirations 14, O2 saturations 98%. Temperature 97 degrees. Pain level is 5/10.  Straight leg raising signs are negative. Deep tendon reflexes are symmetric at the knees, absent at the left ankle, 2+ at the right ankle. Motor showed intact strength. She has symmetric bulk and tone. The sensory is intact to pinprick.  DESCRIPTION OF PROCEDURE:  After informed consent was obtained, the patient was placed in the sitting position and monitored. Her back was prepped with Betadine x 3. A skin wheel was raised at the L4-5 interspace with 1% lidocaine. A 20 gauge Tuohy  needle was introduced into the epidural space with loss of resistance and preservative free normal saline. There was no CSF or blood. Then 40 mg of Medrol and 10 ml of preservative-free normal saline was gently injected. The needle was flushed with preservative free normal saline and removed intact.  POST PROCEDURE CONDITION: Stable.  DISCHARGE INSTRUCTIONS: 1. Resume previous diet. 2. Limitation of activities per instruction sheet. 3. Continue with current medications. 4. Followup with me in one week at which time, we will repeat her injection. DD:  08/05/99 TD:  08/06/99 Job: [id number]

## 2010-06-26 NOTE — Procedures (Signed)
[facility]  Patient:    [name], [name]                        MRN: [id number] Proc. Date: 08/29/00 Adm. Date:  [id number] Attending:  Thyra Breed CC:         Julio Sicks, M.D.  Heather Roberts, M.D.   Procedure Report  PROCEDURE:  Lumbar epidural steroid injection.  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  INTERVAL HISTORY:  The patients noted some mild improvement in her overall discomfort but has been quite active around the house. It is very difficult to tell whether she is actually better. She has noted in her right leg she has developed some instep discomfort.  MEDICATIONS:  Unchanged.  PHYSICAL EXAMINATION:  Blood pressure 95/51, heart rate 72, respiratory rate 16, O2 saturations 99%, pain level is 6/10. Her back shows good healing from a previous injection site.  DESCRIPTION OF PROCEDURE:  After informed consent was obtained, the patient was placed in the sitting position and monitored. The patients back was prepped with Betadine x 3. A skin wheal was raised at the L4-5 interspace with 1 percent lidocaine. A 20 gauge Tuohy needle was introduced to the lumbar epidural space to loss of resistance to preservative free normal saline. There was no cerebrospinal fluid nor blood. 80 mg of Medrol mixed with 8 ml of preservative free normal saline was gently injected.  The needle was flushed and removed intact.  CONDITION POST PROCEDURE:  Stable.  DISCHARGE INSTRUCTIONS:  Resume previous diet. Limitations in activities per instruction sheet. Continue on current medications. Follow-up with me in one to two weeks for repeat injection. DD:  08/29/00 TD:  08/29/00 Job: [id number]

## 2010-06-26 NOTE — Procedures (Signed)
[facility]  Patient:    [name], [name]                        MRN: [id number] Proc. Date: 08/24/99 Adm. Date:  [id number] Attending:  Thyra Breed CC:         Julio Sicks, M.D.                           Procedure Report  PROCEDURE:  Lumbar epidural steroid injection.  DIAGNOSIS:  SI radiculopathy with underlying degenerative disk disease.  INTERVAL HISTORY:  The patient has noted overall improvement with her last injection.  She is here for her third and final injection.  PHYSICAL EXAMINATION:  Blood pressure is 112/58.  Heart rate is 63. Respiratory rate 12.  O2 saturations 99%.  Pain level is 5/10, and temperature is 97.5.  Her back shows good healing from previous injection site.  Neuro examination is grossly unchanged.  DESCRIPTION OF PROCEDURE:  After informed consent was obtained, the patient was placed in a sitting position and monitored.  Her back was prepped with Betadine x 3.  A skin wheal was raised at the L4-L5 interspace with 1% lidocaine.  A 20 gauge Tuohy needle was introduced in the lumbar epidural space with loss of resistance to preservative-free normal saline.  There was no CSF nor blood.  Medrol 40 mg in 8 mL of preservative-free normal saline was gently injected.  The needle was flushed with preservative-free normal saline and removed intact.  Postprocedure condition - stable.  DISCHARGE INSTRUCTIONS: 1. Resume previous diet. 2. Limitations on activities per instruction sheet. 3. Continue on current medications. 4. The patient was encouraged to go ahead and follow up with Dr. Jordan Likes, as    previously arranged. DD:  08/24/99 TD:  08/24/99 Job: [id number]

## 2010-06-26 NOTE — Op Note (Signed)
[place]  Patient:    [name], [name] Visit Number: [id number] MRN: [id number]          Service Type: SUR Location: [facility] [id number] Attending Physician:  Donn Pierini Dictated by:   Julio Sicks, M.D. Proc. 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  PROCEDURE:  Left L5-S1 decompressive laminotomy with microdiskectomy.  SURGEON:  Julio Sicks, M.D.  ASSISTANT:  Reinaldo Meeker, M.D.  ANESTHESIA:  General endotracheal.  INDICATION:  [name] is a 65 year old female with history of chronic back and left lower extremity pain, paresthesias, and weakness consistent with a left-sided S1 radiculopathy, which has failed conservative management.  MRI scan demonstrates evidence of a broad-based disk bulge at L5-S1 eccentric to the left with coexistent facet arthropathy causing rather marked stenosis and compression of the left-sided S1 nerve root.  We have discussed options for management.  She has failed conservative management.  She has decided to proceed with a left-sided L5-S1 laminotomy and microdiskectomy for hopeful improvement of her symptoms.  DESCRIPTION OF PROCEDURE:  Patient taken to the operating room and placed on the operating table in supine position.  After adequate level of anesthesia was achieved, patient was positioned prone onto a Wilson frame, appropriately padded.  The patients lumbar region is prepped and draped sterilely.  A 10 blade is used to make a linear skin incision overlying the L5-S1 interspace. Dissection is carried down in the midline.  A subperiosteal dissection is performed on the left side, exposing the laminae and facet joints at L5 and S1.  Deep  self-retaining retractor is placed, intraoperative x-ray is taken, and the level was confirmed.  The laminotomy is then performed using the high-speed drill and Kerrison rongeurs to remove the inferior one-third of the lamina at L5, the medial edge of the L5-S1 facet joint, and the superior rim of the S1 lamina.  Ligamentum flavum was then elevated and resected in piecemeal fashion using Kerrison rongeurs.  The underlying thecal sac and exiting S1 nerve root were identified.  A decompressive foraminotomy was then performed along the course of the left-sided S1 nerve root, fully decompressing the posterior aspect of the nerve root itself.  The microscope was brought into the field and used for microdissection of the left-sided S1 nerve root underlying disk herniation.  Epidural venous plexus was coagulated and cut.  The thecal sac and S1 nerve root were mobilized and retracted toward the midline.  The disk herniation was readily apparent.  It was incised with a 15 blade in a rectangular fashion.  A wide disk space clean-out was then achieved using pituitary rongeurs, upward-angled pituitary rongeurs, and Epstein curettes.  All loose or obviously degenerative disk material was removed from the interspace.  All disk herniation was completely resected.  At this point there was no evidence of any continuing compression of the thecal sac or nerve roots.  A blunt probe was passed easily along their course.  The wound was then copiously irrigated with antibiotic solution and Gelfoam was placed topically for hemostasis, which was found to be good.  The microscope and retractor system were removed.  Hemostasis in the  muscle was achieved with electrocautery.  The wounds were closed in layers with Vicryl sutures. Steri-Strips and sterile dressing were applied.  There were no apparent complications.  The patient tolerated the procedure well, and she returns to the recovery room postop. Dictated by:    Julio Sicks, M.D. Attending Physician:  Donn Pierini DD:  11/08/00 TD:  11/08/00 Job: [id number]

## 2010-07-30 ENCOUNTER — Other Ambulatory Visit: Payer: Self-pay | Admitting: Gastroenterology

## 2010-08-04 ENCOUNTER — Emergency Department (HOSPITAL_BASED_OUTPATIENT_CLINIC_OR_DEPARTMENT_OTHER)
Admission: EM | Admit: 2010-08-04 | Discharge: 2010-08-04 | Disposition: A | Discharge status: Home / Self Care | Payer: Medicare Other | Attending: Emergency Medicine | Admitting: Emergency Medicine

## 2010-08-04 ENCOUNTER — Emergency Department (INDEPENDENT_AMBULATORY_CARE_PROVIDER_SITE_OTHER): Payer: Medicare Other

## 2010-08-04 ENCOUNTER — Ambulatory Visit
Admission: RE | Admit: 2010-08-04 | Discharge: 2010-08-04 | Disposition: A | Discharge status: Home / Self Care | Payer: Medicare Other | Source: Ambulatory Visit | Attending: Gastroenterology | Admitting: Gastroenterology

## 2010-08-04 DIAGNOSIS — S61409A Unspecified open wound of unspecified hand, initial encounter: Secondary | ICD-10-CM | POA: Insufficient documentation

## 2010-08-04 DIAGNOSIS — IMO0001 Reserved for inherently not codable concepts without codable children: Secondary | ICD-10-CM

## 2010-08-04 DIAGNOSIS — E7800 Pure hypercholesterolemia, unspecified: Secondary | ICD-10-CM | POA: Insufficient documentation

## 2010-08-04 DIAGNOSIS — F319 Bipolar disorder, unspecified: Secondary | ICD-10-CM | POA: Insufficient documentation

## 2010-08-04 DIAGNOSIS — Z86718 Personal history of other venous thrombosis and embolism: Secondary | ICD-10-CM | POA: Insufficient documentation

## 2010-08-04 DIAGNOSIS — Z79899 Other long term (current) drug therapy: Secondary | ICD-10-CM | POA: Insufficient documentation

## 2010-08-04 DIAGNOSIS — K219 Gastro-esophageal reflux disease without esophagitis: Secondary | ICD-10-CM | POA: Insufficient documentation

## 2010-08-05 ENCOUNTER — Emergency Department (HOSPITAL_BASED_OUTPATIENT_CLINIC_OR_DEPARTMENT_OTHER)
Admission: EM | Admit: 2010-08-05 | Discharge: 2010-08-05 | Disposition: A | Discharge status: Home / Self Care | Payer: Medicare Other | Attending: Emergency Medicine | Admitting: Emergency Medicine

## 2010-08-05 DIAGNOSIS — Z86718 Personal history of other venous thrombosis and embolism: Secondary | ICD-10-CM | POA: Insufficient documentation

## 2010-08-05 DIAGNOSIS — F319 Bipolar disorder, unspecified: Secondary | ICD-10-CM | POA: Insufficient documentation

## 2010-08-05 DIAGNOSIS — IMO0001 Reserved for inherently not codable concepts without codable children: Secondary | ICD-10-CM | POA: Insufficient documentation

## 2010-08-05 DIAGNOSIS — Y92009 Unspecified place in unspecified non-institutional (private) residence as the place of occurrence of the external cause: Secondary | ICD-10-CM | POA: Insufficient documentation

## 2010-08-05 DIAGNOSIS — Z79899 Other long term (current) drug therapy: Secondary | ICD-10-CM | POA: Insufficient documentation

## 2010-08-05 DIAGNOSIS — M810 Age-related osteoporosis without current pathological fracture: Secondary | ICD-10-CM | POA: Insufficient documentation

## 2010-08-05 DIAGNOSIS — K219 Gastro-esophageal reflux disease without esophagitis: Secondary | ICD-10-CM | POA: Insufficient documentation

## 2010-08-05 DIAGNOSIS — S60229A Contusion of unspecified hand, initial encounter: Secondary | ICD-10-CM | POA: Insufficient documentation

## 2010-08-05 LAB — BASIC METABOLIC PANEL
BUN: 13 mg/dL (ref 6–23)
Calcium: 9.3 mg/dL (ref 8.4–10.5)
Creatinine, Ser: 0.9 mg/dL (ref 0.50–1.10)
GFR calc non Af Amer: >60 mL/min (ref >60)
Glucose, Bld: 93 mg/dL (ref 70–99)
Potassium: 3.4 mEq/L — ABNORMAL LOW (ref 3.5–5.1)

## 2010-08-05 LAB — CBC
HCT: 36.4 % (ref 36.0–46.0)
Hemoglobin: 12 g/dL (ref 12.0–15.0)
MCH: 26.3 pg (ref 26.0–34.0)
MCHC: 33 g/dL (ref 30.0–36.0)
MCV: 79.6 fL (ref 78.0–100.0)

## 2010-08-05 LAB — DIFFERENTIAL
Eosinophils Relative: 4 % (ref 0–5)
Lymphocytes Relative: 23 % (ref 12–46)
Lymphs Abs: 2.2 10*3/uL (ref 0.7–4.0)
Monocytes Absolute: 1 10*3/uL (ref 0.1–1.0)
Monocytes Relative: 11 % (ref 3–12)
Neutro Abs: 5.9 10*3/uL (ref 1.7–7.7)

## 2010-08-13 ENCOUNTER — Other Ambulatory Visit (HOSPITAL_COMMUNITY): Payer: Self-pay | Admitting: Orthopedic Surgery

## 2010-08-13 DIAGNOSIS — M75100 Unspecified rotator cuff tear or rupture of unspecified shoulder, not specified as traumatic: Secondary | ICD-10-CM

## 2010-08-25 ENCOUNTER — Other Ambulatory Visit: Payer: Self-pay | Admitting: Neurosurgery

## 2010-08-25 DIAGNOSIS — M546 Pain in thoracic spine: Secondary | ICD-10-CM

## 2010-08-25 DIAGNOSIS — M545 Low back pain: Secondary | ICD-10-CM

## 2010-08-26 ENCOUNTER — Ambulatory Visit (HOSPITAL_COMMUNITY)
Admission: RE | Admit: 2010-08-26 | Discharge: 2010-08-26 | Disposition: A | Discharge status: Home / Self Care | Payer: Medicare Other | Source: Ambulatory Visit | Attending: Orthopedic Surgery | Admitting: Orthopedic Surgery

## 2010-08-26 DIAGNOSIS — M75100 Unspecified rotator cuff tear or rupture of unspecified shoulder, not specified as traumatic: Secondary | ICD-10-CM

## 2010-08-26 DIAGNOSIS — M19019 Primary osteoarthritis, unspecified shoulder: Secondary | ICD-10-CM | POA: Insufficient documentation

## 2010-08-26 DIAGNOSIS — M25519 Pain in unspecified shoulder: Secondary | ICD-10-CM | POA: Insufficient documentation

## 2010-08-31 ENCOUNTER — Ambulatory Visit
Admission: RE | Admit: 2010-08-31 | Discharge: 2010-08-31 | Disposition: A | Discharge status: Home / Self Care | Payer: Medicare Other | Source: Ambulatory Visit | Attending: Neurosurgery | Admitting: Neurosurgery

## 2010-08-31 DIAGNOSIS — M545 Low back pain: Secondary | ICD-10-CM

## 2010-08-31 DIAGNOSIS — M546 Pain in thoracic spine: Secondary | ICD-10-CM

## 2011-01-20 IMAGING — CT CT L SPINE W/O CM
1 of 11 series · 2 of 20 positions shown, 3 images · non-contrast
Comparison: MRI 06/13/2006

CLINICAL DATA: Low back pain.  Previous fusion.  Left foot pain.
Assess for new disc herniation.

CT LUMBAR SPINE WITHOUT CONTRAST
TECHNIQUE: Multidetector CT imaging of the lumbar spine was
performed without intravenous contrast administration. Multiplanar
CT image reconstructions were also generated.

[Series 103: l spine detail · axial · 0.27mm/px · z∈[-238,-153]mm · 2 of 102 slices shown, 3 images]
[im 34/102  soft-tissue]
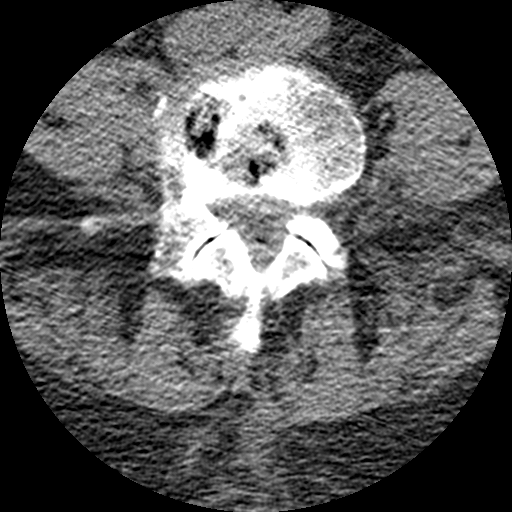
[im 34/102  bone]
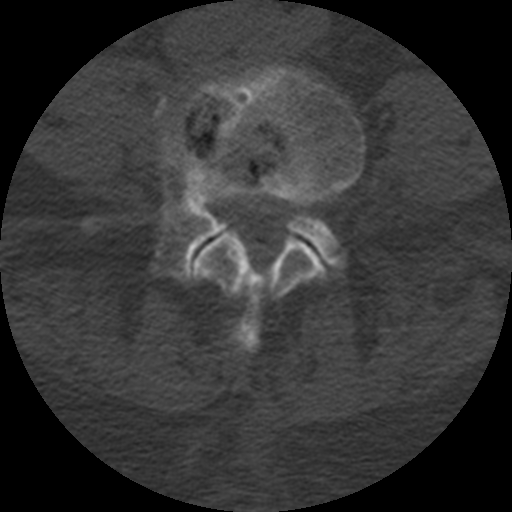
[im 68/102  bone]
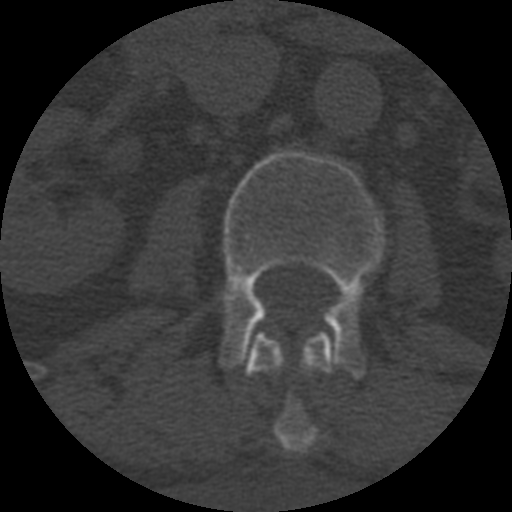

[2 of 20 positions shown; findings below may reference images not displayed]

FINDINGS: T11-12, T12-L1 and L1-L2 view:  Normal interspaces.

L2-3:  There is 1 mm retrolisthesis.  The disc bulges mildly.  No
suspicion of compressive stenosis.

L3-4:  1 mm retrolisthesis.  No suspicion of compressive stenosis.

L4-5:  The disc is degenerated with vacuum phenomenon.  There
appears to be circumferential protrusion of disc material.  There
is bilateral facet arthropathy and ligamentous hypertrophy.  There
appears to be multifactorial stenosis at this level.  This could be
more severe than was seen in 2886.

L5-S1:  The disc is degenerated with loss of height and vacuum
phenomenon.  There is small endplate osteophytes there is mild
bulging of the disc.  There is mild facet degeneration bilaterally.
There is mild narrowing of the lateral recesses and neural
foramina.  Findings appear slightly worse than on the previous
exam.  There be some potential for neural compression on either
side at this level.
IMPRESSION: Worsening of degenerative disease of both L4-5 and L5-S1.  There be
potential for neural compression on either side at either or both
of these levels.  Stenotic disease is worse at L4-5 and L5-S1.
Detail is somewhat limited using this technique.

## 2011-04-02 ENCOUNTER — Telehealth: Payer: Self-pay | Admitting: Emergency Medicine

## 2011-04-02 NOTE — Telephone Encounter (Signed)
Office notes have been placed in RB look at folder.

## 2011-08-26 ENCOUNTER — Telehealth: Payer: Self-pay | Admitting: Emergency Medicine

## 2011-08-26 MED ORDER — MOMETASONE FURO-FORMOTEROL FUM 100-5 MCG/ACT IN AERO: 2.0000 | INHALATION_SPRAY | Freq: Two times a day (BID) | RESPIRATORY_TRACT | Status: DC

## 2011-08-26 NOTE — Telephone Encounter (Signed)
Spoke with pt an requested rx for dulera  rx sent to pharmacy

## 2011-09-29 ENCOUNTER — Other Ambulatory Visit: Payer: Self-pay | Admitting: Emergency Medicine

## 2012-03-09 ENCOUNTER — Other Ambulatory Visit: Payer: Self-pay | Admitting: Neurosurgery

## 2012-03-09 DIAGNOSIS — M48061 Spinal stenosis, lumbar region without neurogenic claudication: Secondary | ICD-10-CM

## 2012-03-16 ENCOUNTER — Ambulatory Visit
Admission: RE | Admit: 2012-03-16 | Discharge: 2012-03-16 | Disposition: A | Discharge status: Home / Self Care | Payer: Medicare Other | Source: Ambulatory Visit | Attending: Neurosurgery | Admitting: Neurosurgery

## 2012-03-16 DIAGNOSIS — M48061 Spinal stenosis, lumbar region without neurogenic claudication: Secondary | ICD-10-CM

## 2012-03-20 ENCOUNTER — Ambulatory Visit
Admission: RE | Admit: 2012-03-20 | Discharge: 2012-03-20 | Disposition: A | Discharge status: Home / Self Care | Payer: Medicare Other | Source: Ambulatory Visit | Attending: Neurosurgery | Admitting: Neurosurgery

## 2012-03-20 VITALS — BP 126/67 | HR 63

## 2012-03-20 DIAGNOSIS — M48061 Spinal stenosis, lumbar region without neurogenic claudication: Secondary | ICD-10-CM

## 2012-03-20 DIAGNOSIS — M549 Dorsalgia, unspecified: Secondary | ICD-10-CM

## 2012-03-20 MED ORDER — IOHEXOL 180 MG/ML  SOLN
20.0000 mL | Freq: Once | INTRAMUSCULAR | Status: AC | PRN
  Administered 2012-03-20: 20 mL via INTRATHECAL

## 2012-03-20 MED ORDER — ONDANSETRON HCL 4 MG/2ML IJ SOLN: 4.0000 mg | Freq: Four times a day (QID) | INTRAMUSCULAR | Status: DC | PRN

## 2012-03-20 MED ORDER — DIAZEPAM 5 MG PO TABS
5.0000 mg | ORAL_TABLET | Freq: Once | ORAL | Status: AC
  Administered 2012-03-20: 5 mg via ORAL

## 2012-03-20 NOTE — Progress Notes (Addendum)
Pt states she has been off abilify, promethazine, ritalin and bupropion for the past 2 days.

## 2012-05-31 ENCOUNTER — Telehealth: Payer: Self-pay | Admitting: Emergency Medicine

## 2012-05-31 MED ORDER — ALBUTEROL SULFATE HFA 108 (90 BASE) MCG/ACT IN AERS: 2.0000 | INHALATION_SPRAY | Freq: Four times a day (QID) | RESPIRATORY_TRACT | Status: DC | PRN

## 2012-05-31 NOTE — Telephone Encounter (Signed)
Do not restart Dulera Use albuterol 2 puffs q4h prn sob  Follow up to discuss further

## 2012-05-31 NOTE — Telephone Encounter (Signed)
Spoke with patient and informed her of recs as listed below. Will send Rx to [facility] (verified pharmacy) Nothing further needed at this time.

## 2012-05-31 NOTE — Telephone Encounter (Signed)
Spoke with patient-- She reports she has been using Dulera inhaler,reports she has had 8 cases of thrush since October. She states she has soaked dentures in Nystatin daily, tried several diff antifungals, Per Dr. Lenise Arena-- only use [facility] one time per week, and she has stopped taking steroid nasal drops. States that she has tapered herself off the [facility] now x2 months and has not had any more thrush. However, her breathing is worse even with light activity. Can the steroid nose drops cause thrush? Is there an alternative inhaler she can switch to that may not cause her to have such difficulty w thrush? Dr. Delton Coombes please advise, thank you!  Patient last seen 05/22/10--- I have scheduled her to come in to see Dr. Herbert Moors 06/19/12 @215pm 

## 2012-06-19 ENCOUNTER — Ambulatory Visit (INDEPENDENT_AMBULATORY_CARE_PROVIDER_SITE_OTHER): Payer: Medicare Other | Admitting: Emergency Medicine

## 2012-06-19 ENCOUNTER — Encounter: Payer: Self-pay | Admitting: Emergency Medicine

## 2012-06-19 VITALS — BP 144/74 | HR 83 | Temp 98.3°F | Ht 63.0 in | Wt 238.0 lb

## 2012-06-19 DIAGNOSIS — J984 Other disorders of lung: Secondary | ICD-10-CM

## 2012-06-19 DIAGNOSIS — R05 Cough: Secondary | ICD-10-CM

## 2012-06-19 DIAGNOSIS — J309 Allergic rhinitis, unspecified: Secondary | ICD-10-CM

## 2012-06-19 DIAGNOSIS — K219 Gastro-esophageal reflux disease without esophagitis: Secondary | ICD-10-CM

## 2012-06-19 MED ORDER — ALBUTEROL SULFATE HFA 108 (90 BASE) MCG/ACT IN AERS: 2.0000 | INHALATION_SPRAY | Freq: Four times a day (QID) | RESPIRATORY_TRACT | Status: DC | PRN

## 2012-06-19 NOTE — Addendum Note (Signed)
Addended by: Orma Flaming D on: 06/19/2012 03:24 PM   Modules accepted: Orders

## 2012-06-19 NOTE — Assessment & Plan Note (Signed)
Repeat CT scan chest now to compare w priors.

## 2012-06-19 NOTE — Patient Instructions (Addendum)
We will continue your allergy medications and Nexium Continue your Nexium  We will repeat your CT scan of the chest  Follow with Dr Delton Coombes in 1 month to review your CT scan

## 2012-06-19 NOTE — Assessment & Plan Note (Signed)
-   continue loratadine + nasal washes + nasal steroid.

## 2012-06-19 NOTE — Assessment & Plan Note (Signed)
nexium 

## 2012-06-19 NOTE — Progress Notes (Signed)
67 yo former smoker, hx documented allergies and rhinitis, chronic cough. Has seen Dr Corinda Gubler regarding allergies and the cough. For the last month the cough has been much worse, happens with talking and exertion. She has been empirically treated with by mouth steroids and also started on inhaled LABA/ICS. Skin testing has shown no sensitivity to cats/dogs although she has 30 animals in her home. Spirometry done 07/03/09 showed normal airflows. She has also been treated for GERD with Nexium + prilosec.   ROV 11/04/09 -- returns for cough. Her HSP panel is negative for fungal sensitivity, IgE and eosinophils normal. Her cough persists, may be a bit better. Able to carry on more conversation, talking does make it worse. Dr Maple Hudson rightly raises possibility of auto-immune process, even BAC.   ROV 12/03/09 -- chronic cough, chronic rhinitis, normal AF on spirometry. She was admitted 10/9-10/12 to [facility] with fever, lightheadedness, dyspnea, cough was actually better. Treated for ? COPD exac or PNA. CT scan without infiltrates (per d/c summary). Cleda Daub 5/11 normal. She believes that the [facility] helps her. Her cough is better. 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   ROV 12/25/09 -- chronic cough, rhinitis, abnormal CT scan (? mild nodular infiltrate, 09/2009). Repeat scan done at [facility] in 10/11, need to review and compare. Initial plan has been to repeat scan in Dec. Has had PFT since last visit, here to review. 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 Her exposure profile at home is very high - dusts, litter etc. She is on [place], sometimes coughs after it. She believes that the [facility] is helping her breathing.   ROV 05/22/10 -- returns for eval severe allergies, POSSIBLE AFL, on Dulera + flonase, antivert, loratadine, omeprazole, cough suppressants, nasal saline washes. Her cough has been worse over the last week, 1 week ago she had fever, chills.  Some hoarse voice.  ROV 06/19/12 -- returns for eval severe allergies, POSSIBLE AFL. She returns reporting that her cough has improved. She is using loratadine [place] and fluticasone nasal spray. She restarted her dulera without too much problem. She uses proair prn, about 2x a month. She has had a lot of problem w thrush after taking abx., ? dulera part of the problem. She is overdue to have repeat CT scan chest for RML nodule.   Filed Vitals:   06/19/12 1428  BP: 144/74  Pulse: 83  Temp: 98.3 F (36.8 C)  TempSrc: Oral  Height: 5\' 3"  (1.6 m)  Weight: 238 lb (107.956 kg)  SpO2: 97%   Gen: Pleasant, well-nourished, in no distress,  normal affect  ENT: No lesions,  mouth clear,  oropharynx clear, no postnasal drip  Neck: No JVD, no TMG, no carotid bruits  Lungs: No use of accessory muscles, no dullness to percussion, clear without rales or rhonchi  Cardiovascular: RRR, heart sounds normal, no murmur or gallops, no peripheral edema  Musculoskeletal: No deformities, no cyanosis or clubbing  Neuro: alert, non focal  Skin: Warm, no lesions or rashes  COUGH Better w aggressive rx for her PND   ALLERGIC RHINITIS - continue loratadine + nasal washes + nasal steroid.   G E R D nexium

## 2012-06-19 NOTE — Assessment & Plan Note (Signed)
Better w aggressive rx for her PND

## 2012-06-27 ENCOUNTER — Ambulatory Visit (INDEPENDENT_AMBULATORY_CARE_PROVIDER_SITE_OTHER)
Admission: RE | Admit: 2012-06-27 | Discharge: 2012-06-27 | Disposition: A | Discharge status: Home / Self Care | Payer: Medicare Other | Source: Ambulatory Visit | Attending: Emergency Medicine | Admitting: Emergency Medicine

## 2012-06-27 DIAGNOSIS — J984 Other disorders of lung: Secondary | ICD-10-CM

## 2012-07-05 ENCOUNTER — Telehealth: Payer: Self-pay | Admitting: Emergency Medicine

## 2012-07-05 DIAGNOSIS — R0602 Shortness of breath: Secondary | ICD-10-CM

## 2012-07-05 NOTE — Telephone Encounter (Signed)
Patient states she has been losing her breath x1 yr and this seems to be worsening w exertion now Ie. Walking across yard, mopping, sweeping floor,etc  Patient states she thought this was weight related but says she has lost weight in the "lung area" and still feels significantly SOB Cant figure out why this is happening, says her SOB it is noticeable to other people Can this be related to her living conditions (cats, dogs, dust in her home) Patient would like to know Is this COPD or upper respiratory inflammation?  Dr. Delton Coombes, please advise,thank you!

## 2012-07-05 NOTE — Progress Notes (Signed)
Quick Note:  Spoke with patient, informed her of CT results/recs as listed below per RB Pt verbalized understanding and nothing further needed at this time. ______

## 2012-07-07 NOTE — Telephone Encounter (Signed)
Spoke with patient, patient is aware that order for CPST is being placed and someone from our office will be calling her to schedule this. Patient is scheduled to see Dr. Delton Coombes June 27 @ 315pm Patient aware and nothing further needed at this time.

## 2012-07-07 NOTE — Telephone Encounter (Signed)
Her sx are with exertion, also with a lot of talking.  Suspect deconditioning, but she is at risk for other causes. I think we should set up CPST, then a follow up visit to review.,

## 2012-07-07 NOTE — Telephone Encounter (Signed)
Pt's phone cut off while waiting to speak to Lauren to set up test.  Please call her back @ [phone]. Leanora Ivanoff

## 2012-07-18 ENCOUNTER — Ambulatory Visit (HOSPITAL_COMMUNITY): Payer: Medicare Other | Attending: Emergency Medicine

## 2012-07-18 DIAGNOSIS — R0602 Shortness of breath: Secondary | ICD-10-CM

## 2012-07-20 ENCOUNTER — Ambulatory Visit: Payer: Medicare Other | Admitting: Emergency Medicine

## 2012-08-04 ENCOUNTER — Ambulatory Visit (INDEPENDENT_AMBULATORY_CARE_PROVIDER_SITE_OTHER): Payer: Medicare Other | Admitting: Emergency Medicine

## 2012-08-04 ENCOUNTER — Encounter: Payer: Self-pay | Admitting: Emergency Medicine

## 2012-08-04 VITALS — BP 122/70 | HR 79 | Temp 98.6°F | Ht 63.0 in | Wt 216.4 lb

## 2012-08-04 DIAGNOSIS — J383 Other diseases of vocal cords: Secondary | ICD-10-CM

## 2012-08-04 DIAGNOSIS — J309 Allergic rhinitis, unspecified: Secondary | ICD-10-CM

## 2012-08-04 DIAGNOSIS — J984 Other disorders of lung: Secondary | ICD-10-CM

## 2012-08-04 NOTE — Assessment & Plan Note (Signed)
CPST suggests no significant lower airway obstruction.  - rhinitis regimen - discussed techniques to relax when she obstructs.  - stop dulera - saba prn.  - rov4

## 2012-08-04 NOTE — Patient Instructions (Addendum)
Please stop your Dulera Continue to have albuterol available to use as needed Continue your allergy medications as you are taking them Part of your breathing problem is called "Vocal cord dysfunction". Look this up to learn about it and relaxation techniques that will help you breathe Follow with Dr Delton Coombes in 4 months or sooner if you have any problems.

## 2012-08-04 NOTE — Assessment & Plan Note (Signed)
Stable by CT scan 07/05/12. No further CT's needed unless clinical change

## 2012-08-04 NOTE — Progress Notes (Signed)
67 yo former smoker, hx documented allergies and rhinitis, chronic cough. Has seen Dr Corinda Gubler regarding allergies and the cough. For the last month the cough has been much worse, happens with talking and exertion. She has been empirically treated with by mouth steroids and also started on inhaled LABA/ICS. Skin testing has shown no sensitivity to cats/dogs although she has 30 animals in her home. Spirometry done 07/03/09 showed normal airflows. She has also been treated for GERD with Nexium + prilosec.   ROV 11/04/09 -- returns for cough. Her HSP panel is negative for fungal sensitivity, IgE and eosinophils normal. Her cough persists, may be a bit better. Able to carry on more conversation, talking does make it worse. Dr Maple Hudson rightly raises possibility of auto-immune process, even BAC.   ROV 12/03/09 -- chronic cough, chronic rhinitis, normal AF on spirometry. She was admitted 10/9-10/12 to [facility] with fever, lightheadedness, dyspnea, cough was actually better. Treated for ? COPD exac or PNA. CT scan without infiltrates (per d/c summary). Cleda Daub 5/11 normal. She believes that the [facility] helps her. Her cough is better. 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   ROV 12/25/09 -- chronic cough, rhinitis, abnormal CT scan (? mild nodular infiltrate, 09/2009). Repeat scan done at [facility] in 10/11, need to review and compare. Initial plan has been to repeat scan in Dec. Has had PFT since last visit, here to review. 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 Her exposure profile at home is very high - dusts, litter etc. She is on [place], sometimes coughs after it. She believes that the [facility] is helping her breathing.   ROV 05/22/10 -- returns for eval severe allergies, POSSIBLE AFL, on Dulera + flonase, antivert, loratadine, omeprazole, cough suppressants, nasal saline washes. Her cough has been worse over the last week, 1 week ago she had fever, chills.  Some hoarse voice.  ROV 06/19/12 -- returns for eval severe allergies, POSSIBLE AFL. She returns reporting that her cough has improved. She is using loratadine [place] and fluticasone nasal spray. She restarted her dulera without too much problem. She uses proair prn, about 2x a month. She has had a lot of problem w thrush after taking abx., ? dulera part of the problem. She is overdue to have repeat CT scan chest for RML nodule.   ROV 08/04/12 -- cough and rhinitis w allergies, dyspnea with possible AFL.  Her cough is somewhat better. She still has SOB with exertion, had an episode of classic UA closure in the shower recently. She relaxed and it resolved.   Filed Vitals:   08/04/12 1502  BP: 122/70  Pulse: 79  Temp: 98.6 F (37 C)  TempSrc: Oral  Height: 5\' 3"  (1.6 m)  Weight: 216 lb 6.4 oz (98.158 kg)  SpO2: 97%   Gen: Pleasant, well-nourished, in no distress,  normal affect  ENT: No lesions,  mouth clear,  oropharynx clear, no postnasal drip  Neck: No JVD, no TMG, no carotid bruits  Lungs: No use of accessory muscles, no dullness to percussion, clear without rales or rhonchi  Cardiovascular: RRR, heart sounds normal, no murmur or gallops, no peripheral edema  Musculoskeletal: No deformities, no cyanosis or clubbing  Neuro: alert, non focal  Skin: Warm, no lesions or rashes   07/05/12 --  Comparison: 10/06/2009 chest CT  Findings: Cervical fusion hardware partly visualized. Great  vessels are normal in caliber. Trace fluid within the superior  pericardial recess is noted. Heart size is normal. No  lymphadenopathy.  Incomplete imaging of the upper abdomen demonstrates metallic  hardware from spinal stimulator in the right flank subcutaneous  tissues producing streak artifact. Cholecystectomy clips noted.  Cysts are again noted at the dome of the liver.  Right middle lobe subpleural 6 mm nodule image 29 demonstrate  ground-glass opacity and decreased density since previously. No  new  pulmonary mass, 3 mm left lower lobe pulmonary nodule image 43 is  stable. No new pulmonary mass, nodule, or consolidation. Central  airways are patent.  No acute osseous finding.  IMPRESSION:  Stable pulmonary nodules as above since previous exam 2011. This  is compatible with probable benignity and does not warrant further  specific imaging follow-up according to consensus guidelines.  No new acute finding.   CPST 07/18/12 --  Conclusion: Exercise testing with gas exchange demonstrates a normal functional capacity when compared to matched sedentary norms. There is a relative hyperventilation near peak exercise (relatively steep RCP), when VE/VCO2 is corrected to the RCP the slope remains significantly elevated at 37. The elevated VE/VCO2 slope and low HR response are concerning for an underlying circulatory limitation as primary problem. The most common type in obese individuals is diastolic dysfunction. In addition, Air flow limitation and obesity could be contributing.   PULMONARY NODULE, RIGHT MIDDLE LOBE Stable by CT scan 07/05/12. No further CT's needed unless clinical change  ALLERGIC RHINITIS Continue same medications.   Vocal cord dysfunction CPST suggests no significant lower airway obstruction.  - rhinitis regimen - discussed techniques to relax when she obstructs.  - stop dulera - saba prn.  - rov4

## 2012-08-04 NOTE — Assessment & Plan Note (Signed)
Continue same medications.

## 2012-09-13 ENCOUNTER — Other Ambulatory Visit: Payer: Self-pay

## 2012-09-25 ENCOUNTER — Encounter (HOSPITAL_COMMUNITY): Payer: Self-pay | Admitting: Emergency Medicine

## 2012-09-25 ENCOUNTER — Emergency Department (HOSPITAL_COMMUNITY)
Admission: EM | Admit: 2012-09-25 | Discharge: 2012-09-25 | Disposition: A | Discharge status: Home / Self Care | Payer: Medicare Other | Attending: Emergency Medicine | Admitting: Emergency Medicine

## 2012-09-25 ENCOUNTER — Emergency Department (HOSPITAL_COMMUNITY): Payer: Medicare Other

## 2012-09-25 DIAGNOSIS — W010XXA Fall on same level from slipping, tripping and stumbling without subsequent striking against object, initial encounter: Secondary | ICD-10-CM | POA: Insufficient documentation

## 2012-09-25 DIAGNOSIS — R0609 Other forms of dyspnea: Secondary | ICD-10-CM | POA: Insufficient documentation

## 2012-09-25 DIAGNOSIS — Z87891 Personal history of nicotine dependence: Secondary | ICD-10-CM | POA: Insufficient documentation

## 2012-09-25 DIAGNOSIS — M47817 Spondylosis without myelopathy or radiculopathy, lumbosacral region: Secondary | ICD-10-CM | POA: Insufficient documentation

## 2012-09-25 DIAGNOSIS — F319 Bipolar disorder, unspecified: Secondary | ICD-10-CM | POA: Insufficient documentation

## 2012-09-25 DIAGNOSIS — S5001XA Contusion of right elbow, initial encounter: Secondary | ICD-10-CM

## 2012-09-25 DIAGNOSIS — R0602 Shortness of breath: Secondary | ICD-10-CM | POA: Insufficient documentation

## 2012-09-25 DIAGNOSIS — R0989 Other specified symptoms and signs involving the circulatory and respiratory systems: Secondary | ICD-10-CM | POA: Insufficient documentation

## 2012-09-25 DIAGNOSIS — G43909 Migraine, unspecified, not intractable, without status migrainosus: Secondary | ICD-10-CM | POA: Insufficient documentation

## 2012-09-25 DIAGNOSIS — Z86718 Personal history of other venous thrombosis and embolism: Secondary | ICD-10-CM | POA: Insufficient documentation

## 2012-09-25 DIAGNOSIS — Y929 Unspecified place or not applicable: Secondary | ICD-10-CM | POA: Insufficient documentation

## 2012-09-25 DIAGNOSIS — Z791 Long term (current) use of non-steroidal anti-inflammatories (NSAID): Secondary | ICD-10-CM | POA: Insufficient documentation

## 2012-09-25 DIAGNOSIS — S20211A Contusion of right front wall of thorax, initial encounter: Secondary | ICD-10-CM

## 2012-09-25 DIAGNOSIS — S5000XA Contusion of unspecified elbow, initial encounter: Secondary | ICD-10-CM | POA: Insufficient documentation

## 2012-09-25 DIAGNOSIS — K219 Gastro-esophageal reflux disease without esophagitis: Secondary | ICD-10-CM | POA: Insufficient documentation

## 2012-09-25 DIAGNOSIS — Z79899 Other long term (current) drug therapy: Secondary | ICD-10-CM | POA: Insufficient documentation

## 2012-09-25 DIAGNOSIS — IMO0002 Reserved for concepts with insufficient information to code with codable children: Secondary | ICD-10-CM | POA: Insufficient documentation

## 2012-09-25 DIAGNOSIS — Y939 Activity, unspecified: Secondary | ICD-10-CM | POA: Insufficient documentation

## 2012-09-25 DIAGNOSIS — IMO0001 Reserved for inherently not codable concepts without codable children: Secondary | ICD-10-CM | POA: Insufficient documentation

## 2012-09-25 MED ORDER — FAMOTIDINE 20 MG PO TABS
20.0000 mg | ORAL_TABLET | Freq: Once | ORAL | Status: AC
  Administered 2012-09-25: 20 mg via ORAL
  Filled 2012-09-25: qty 1

## 2012-09-25 MED ORDER — PROMETHAZINE HCL 25 MG/ML IJ SOLN
25.0000 mg | Freq: Once | INTRAMUSCULAR | Status: AC
  Administered 2012-09-25: 25 mg via INTRAMUSCULAR
  Filled 2012-09-25: qty 1

## 2012-09-25 MED ORDER — HYDROMORPHONE HCL PF 1 MG/ML IJ SOLN
1.0000 mg | Freq: Once | INTRAMUSCULAR | Status: AC
  Administered 2012-09-25: 1 mg via INTRAMUSCULAR
  Filled 2012-09-25: qty 1

## 2012-09-25 MED ORDER — MELOXICAM 7.5 MG PO TABS: 15.0000 mg | ORAL_TABLET | Freq: Every day | ORAL | Status: DC

## 2012-09-25 MED ORDER — FAMOTIDINE 20 MG PO TABS: 20.0000 mg | ORAL_TABLET | Freq: Two times a day (BID) | ORAL | Status: DC

## 2012-09-25 NOTE — ED Notes (Signed)
Bed: WA21 Expected date:  Expected time:  Means of arrival:  Comments: EMS  

## 2012-09-25 NOTE — ED Provider Notes (Signed)
CSN: [id number]     Arrival date & time 09/25/12  1540 History     First MD Initiated Contact with Patient 09/25/12 1542     Chief Complaint  Patient presents with  . Fall   (Consider location/radiation/quality/duration/timing/severity/associated sxs/prior Treatment) HPI Comments: 67 year old female with a history of chronic pain presents with a complaint of pain in the right side of her ribs after she tripped stepping up a curb. She states that her flip-flop got caught causing her to fall forward. She landed on her right elbow which impacted her right ribs. Because of the pain she was unable to get up off the ground without EMS assistance. The pain is sharp, right anterior lateral rib cage location, associated with mild shortness of breath with deep breathing, no head injury neck pain numbness or weakness. She does have associated contusion to her right elbow but has no decreased range of motion of the elbow. This occurred just prior to arrival  Patient is a 67 y.o. female presenting with fall. The history is provided by the patient and the EMS personnel.  Fall    Past Medical History  Diagnosis Date  . Cough   . Migraine   . Fibromyalgia   . Bipolar affective disorder   . GERD (gastroesophageal reflux disease)   . Left leg DVT 2010  . DJD (degenerative joint disease) of lumbar spine    Past Surgical History  Procedure Laterality Date  . Cholecystectomy  1989  . Tonsillectomy  1955  . Partial hysterectomy  1980  . Breast enhancement surgery  1989  . Cervical fusion  2005  . Spinal cord stimulator implant  2010  . Carpal tunnel release  2006    Right hand   Family History  Problem Relation Age of Onset  . Lung cancer Mother   . Prostate cancer Father   . Rheum arthritis Paternal Grandmother   . Asthma Sister   . Heart disease Father    History  Substance Use Topics  . Smoking status: Former Smoker -- 2.50 packs/day for 21 years    Types: Cigarettes    Quit date:  09/16/1986  . Smokeless tobacco: Never Used  . Alcohol Use: No     Comment: recovering alcoholic sober since 1989   OB History   Grav Para Term Preterm Abortions TAB SAB Ect Mult Living                 Review of Systems  All other systems reviewed and are negative.    Allergies  Lithium; Lisinopril; and Statins  Home Medications   Current Outpatient Rx  Name  Route  Sig  Dispense  Refill  . albuterol (PROAIR HFA) 108 (90 BASE) MCG/ACT inhaler   Inhalation   Inhale 2 puffs into the lungs every 6 (six) hours as needed.   1 Inhaler   3   . ALPRAZolam (XANAX) 1 MG tablet   Oral   Take 1 mg by mouth at bedtime as needed. 1 every day as needed and 2 at bedtime         . ARIPiprazole (ABILIFY) 10 MG tablet   Oral   Take 10 mg by mouth at bedtime.          . Bepotastine Besilate (BEPREVE) 1.5 % SOLN   Ophthalmic   Apply 1 drop to eye daily. One drop in each eye as needed daily         . buPROPion (BUDEPRION SR) 150 MG 12  hr tablet   Oral   Take 150 mg by mouth 3 (three) times daily.           . butalbital-acetaminophen-caffeine (FIORICET, ESGIC) 50-325-40 MG per tablet   Oral   Take 1 tablet by mouth 2 (two) times daily as needed.           . Calcium Carbonate (CALCIUM 600 PO)   Oral   Take 2 capsules by mouth daily.           . Cholecalciferol (VITAMIN D) 1000 UNITS capsule   Oral   Take 1,000 Units by mouth at bedtime.          . cyclobenzaprine (FLEXERIL) 10 MG tablet   Oral   Take 20 mg by mouth at bedtime. 2 at bedtime         . cycloSPORINE (RESTASIS) 0.05 % ophthalmic emulsion   Both Eyes   Place 1 drop into both eyes 2 (two) times daily.         . Diclofenac Sodium (PENNSAID) 1.5 % SOLN   Both Eyes   Place 1 drop into both eyes daily. As directed         . docusate calcium (SURFAK) 240 MG capsule   Oral   Take 240 mg by mouth once a week.          . docusate sodium (COLACE) 100 MG capsule   Oral   Take 100 mg by mouth 2  (two) times daily.           Marland Kitchen esomeprazole (NEXIUM) 40 MG capsule   Oral   Take 40 mg by mouth daily before breakfast.           . ezetimibe (ZETIA) 10 MG tablet   Oral   Take 10 mg by mouth at bedtime.          . fluorometholone (FML) 0.1 % ophthalmic suspension   Both Eyes   Place 1 drop into both eyes 2 (two) times daily as needed.           Marland Kitchen FLUoxetine (PROZAC) 10 MG capsule   Oral   Take 10 mg by mouth daily.         . fluticasone (FLONASE) 50 MCG/ACT nasal spray   Nasal   2 sprays by Nasal route 2 (two) times daily.           . furosemide (LASIX) 40 MG tablet   Oral   Take 40 mg by mouth 3 (three) times daily.           Marland Kitchen HYDROmorphone (DILAUDID) 2 MG tablet   Oral   Take 2 mg by mouth 2 (two) times daily.           Marland Kitchen ibuprofen (ADVIL,MOTRIN) 200 MG tablet   Oral   Take 200 mg by mouth every 6 (six) hours as needed.           . lamoTRIgine (LAMICTAL) 100 MG tablet   Oral   Take 100 mg by mouth at bedtime.           . Loratadine 10 MG CAPS   Oral   Take 1 capsule by mouth daily.           . meclizine (ANTIVERT) 25 MG tablet   Oral   Take 25 mg by mouth 3 (three) times daily as needed.           . Melatonin 300 MCG TABS   Oral  Take 1 tablet by mouth at bedtime as needed.           . methylphenidate (RITALIN) 20 MG tablet   Oral   Take 40 mg by mouth daily.          . Multiple Vitamin (MULTIVITAMIN) capsule   Oral   Take 1 capsule by mouth daily.           Marland Kitchen omeprazole (PRILOSEC) 20 MG capsule   Oral   Take 20 mg by mouth at bedtime.           Bertram Gala Glycol-Propyl Glycol (SYSTANE) 0.4-0.3 % SOLN   Ophthalmic   Apply 1 drop to eye 3 (three) times daily. Both Eyes         . potassium gluconate 595 MG TABS      2 twice daily         . pramipexole (MIRAPEX) 0.5 MG tablet   Oral   Take 0.5 mg by mouth at bedtime.           . pregabalin (LYRICA) 100 MG capsule   Oral   Take 100 mg by mouth at  bedtime.           . promethazine (PHENERGAN) 25 MG tablet   Oral   Take 25 mg by mouth every 6 (six) hours as needed.           . zonisamide (ZONEGRAN) 100 MG capsule   Oral   Take 200 mg by mouth at bedtime. 2 daily as directed         . famotidine (PEPCID) 20 MG tablet   Oral   Take 1 tablet (20 mg total) by mouth 2 (two) times daily.   30 tablet   0   . meloxicam (MOBIC) 7.5 MG tablet   Oral   Take 2 tablets (15 mg total) by mouth daily.   30 tablet   0    BP 101/58  Pulse 65  Temp(Src) 98.9 F (37.2 C) (Oral)  Resp 18  SpO2 96% Physical Exam  Nursing note and vitals reviewed. Constitutional: She appears well-developed and well-nourished. No distress.  HENT:  Head: Normocephalic and atraumatic.  Mouth/Throat: Oropharynx is clear and moist. No oropharyngeal exudate.  Eyes: Conjunctivae and EOM are normal. Pupils are equal, round, and reactive to light. Right eye exhibits no discharge. Left eye exhibits no discharge. No scleral icterus.  Neck: Normal range of motion. Neck supple. No JVD present. No thyromegaly present.  Cardiovascular: Normal rate, regular rhythm, normal heart sounds and intact distal pulses.  Exam reveals no gallop and no friction rub.   No murmur heard. Pulmonary/Chest: Effort normal and breath sounds normal. No respiratory distress. She has no wheezes. She has no rales. She exhibits tenderness ( Chaperone present, right chest wall evaluated, and tenderness but no crepitance or subcutaneous emphysema over the right anterior lateral inferior right chest wall).  Abdominal: Soft. Bowel sounds are normal. She exhibits no distension and no mass. There is no tenderness.  No abdominal tenderness, no hepatosplenomegaly  Musculoskeletal: Normal range of motion. She exhibits no edema and no tenderness.  Right elbow with soft tissue contusion over the lateral aspect, no decreased range of motion, mild bony tenderness to the right lateral epicondyle of the  humerus  Lymphadenopathy:    She has no cervical adenopathy.  Neurological: She is alert. Coordination normal.  Able to lift both legs off of the bed, normal strength at the hips knees and ankles bilaterally.  Emil strength and sensation to the right upper extremity  Skin: Skin is warm and dry. No rash noted. No erythema.  Psychiatric: She has a normal mood and affect. Her behavior is normal.    ED Course   Procedures (including critical care time)  Labs Reviewed - No data to display Dg Ribs Unilateral W/chest Right  09/25/2012   *RADIOLOGY REPORT*  Clinical Data: Fall with right anterior rib pain  RIGHT RIBS AND CHEST - 3+ VIEW  Comparison: 03/12/2011  Findings: The lungs are clear without focal infiltrate, edema, pneumothorax or pleural effusion. Cardiopericardial silhouette is at upper limits of normal for size. Imaged bony structures of the thorax are intact.  Oblique views of the right ribs show no evidence for acute displaced right-sided rib fracture.  IMPRESSION: No acute cardiopulmonary findings.  No evidence for right-sided rib fracture.   Original Report Authenticated By: Kennith Center, M.D.   Dg Elbow Complete Right  09/25/2012   *RADIOLOGY REPORT*  Clinical Data: Trauma.  Pain  RIGHT ELBOW - COMPLETE 3+ VIEW  Comparison: None  Findings: There is no evidence of fracture or dislocation.  There is no evidence of arthropathy or other focal bone abnormality. Soft tissues are unremarkable.  IMPRESSION: Negative exam.   Original Report Authenticated By: Signa Kell, M.D.   1. Contusion of right chest wall   2. Contusion of right elbow, initial encounter     MDM  The patient is overall well appearing without any signs of fracture on her x-rays to either the elbow or the rib cage. The patient appears stable for discharge, she will be discharged home in a stable condition, indications for return expressed  Meds given in ED:  Medications  promethazine (PHENERGAN) injection 25 mg (25 mg  Intramuscular Given 09/25/12 1647)  HYDROmorphone (DILAUDID) injection 1 mg (1 mg Intramuscular Given 09/25/12 1647)  famotidine (PEPCID) tablet 20 mg (20 mg Oral Given 09/25/12 1653)    Discharge Medication List as of 09/25/2012  5:15 PM    START taking these medications   Details  famotidine (PEPCID) 20 MG tablet Take 1 tablet (20 mg total) by mouth 2 (two) times daily., Starting 09/25/2012, Until Discontinued, Print    meloxicam (MOBIC) 7.5 MG tablet Take 2 tablets (15 mg total) by mouth daily., Starting 09/25/2012, Until Discontinued, Print          Vida Roller, MD 09/26/12 [phone]

## 2012-09-25 NOTE — ED Notes (Signed)
PER EMS- pt picked up from dr office with c/o fall, reports tripping over flip flop.  Pt c/o r shoulder and rib cage pain, 10/10 sharp.  Denies loc or head injury.   No bruising or deformities noted.  Pt denies being on blood thinners.  HX of chronic back pain.

## 2012-10-31 ENCOUNTER — Other Ambulatory Visit: Payer: Self-pay | Admitting: Neurosurgery

## 2012-10-31 ENCOUNTER — Other Ambulatory Visit: Payer: Self-pay | Admitting: Emergency Medicine

## 2012-10-31 MED ORDER — FLUTICASONE PROPIONATE 50 MCG/ACT NA SUSP: 2.0000 | Freq: Two times a day (BID) | NASAL | Status: DC

## 2012-11-01 ENCOUNTER — Encounter (HOSPITAL_COMMUNITY): Payer: Self-pay | Admitting: Pharmacy Technician

## 2012-11-02 ENCOUNTER — Encounter (HOSPITAL_COMMUNITY): Payer: Self-pay

## 2012-11-02 ENCOUNTER — Encounter (HOSPITAL_COMMUNITY)
Admission: RE | Admit: 2012-11-02 | Discharge: 2012-11-02 | Disposition: A | Discharge status: Home / Self Care | Payer: Medicare Other | Source: Ambulatory Visit | Attending: Neurosurgery | Admitting: Neurosurgery

## 2012-11-02 DIAGNOSIS — Z01812 Encounter for preprocedural laboratory examination: Secondary | ICD-10-CM | POA: Insufficient documentation

## 2012-11-02 DIAGNOSIS — Z01818 Encounter for other preprocedural examination: Secondary | ICD-10-CM | POA: Insufficient documentation

## 2012-11-02 DIAGNOSIS — Z01810 Encounter for preprocedural cardiovascular examination: Secondary | ICD-10-CM | POA: Insufficient documentation

## 2012-11-02 HISTORY — DX: Dizziness and giddiness: R42

## 2012-11-02 HISTORY — DX: Other specified postprocedural states: Z98.890

## 2012-11-02 HISTORY — DX: Shortness of breath: R06.02

## 2012-11-02 HISTORY — DX: Other specified postprocedural states: R11.2

## 2012-11-02 HISTORY — DX: Other reaction to spinal and lumbar puncture: G97.1

## 2012-11-02 HISTORY — DX: Pneumonia, unspecified organism: J18.9

## 2012-11-02 HISTORY — DX: Family history of other specified conditions: Z84.89

## 2012-11-02 LAB — CBC WITH DIFFERENTIAL/PLATELET
Basophils Absolute: 0 10*3/uL (ref 0.0–0.1)
Basophils Relative: 0 % (ref 0–1)
Eosinophils Absolute: 0.4 10*3/uL (ref 0.0–0.7)
HCT: 41.2 % (ref 36.0–46.0)
Hemoglobin: 14.1 g/dL (ref 12.0–15.0)
Lymphocytes Relative: 30 % (ref 12–46)
Lymphs Abs: 2.7 10*3/uL (ref 0.7–4.0)
MCH: 29.9 pg (ref 26.0–34.0)
MCHC: 34.2 g/dL (ref 30.0–36.0)
Monocytes Absolute: 0.8 10*3/uL (ref 0.1–1.0)
Monocytes Relative: 8 % (ref 3–12)
Neutrophils Relative %: 57 % (ref 43–77)
RBC: 4.71 MIL/uL (ref 3.87–5.11)
RDW: 15.3 % (ref 11.5–15.5)

## 2012-11-02 LAB — BASIC METABOLIC PANEL
BUN: 19 mg/dL (ref 6–23)
CO2: 27 mEq/L (ref 19–32)
Calcium: 9.1 mg/dL (ref 8.4–10.5)
Chloride: 104 mEq/L (ref 96–112)
Glucose, Bld: 92 mg/dL (ref 70–99)
Potassium: 3.8 mEq/L (ref 3.5–5.1)
Sodium: 140 mEq/L (ref 135–145)

## 2012-11-02 NOTE — Progress Notes (Signed)
Pt chart left for [place], [place] ( anesthesia) to review abnormal EKG.

## 2012-11-02 NOTE — Progress Notes (Signed)
Pt denies SOB, chest pain, and being under the care of a cardiologist. Pt states that she had a stress test over 5 years ago and an echo 8 years ago at [facility] but denies having a cardiac cath. Pt states that she has had a chronic cough for 4 years. Revonda Standard, [place] (anesthesia) made aware of pt PMH of HTN in 2011 ( pt taken off of Lisinopril in 2011 because it led to renal problems and BP has been WNL since) and current respiratory issue; Revonda Standard, [place] advised that an EKG be done but not a chest x ray because "she had a CT scan in May so that should be sufficient."

## 2012-11-02 NOTE — Pre-Procedure Instructions (Signed)
[name]  11/02/2012   Your procedure is scheduled on:  Monday, November 06, 2012  Report to [facility] Short Stay (use Main Entrance "A'') at 11:30 AM.  Call this number if you have problems the morning of surgery: [phone]  Remember:   Do not eat food or drink liquids after midnight.   Take these medicines the morning of surgery with A SIP OF WATER: buPROPion (BUDEPRION SR) 150 MG 12 hr tablet,  esomeprazole (NEXIUM) 40 MG capsule, FLUoxetine (PROZAC) 10 MG capsule, HYDROmorphone (DILAUDID) 2 MG tablet,Polyethyl Glycol-Propyl Glycol (SYSTANE) 0.4-0.3 % SOLN, cycloSPORINE (RESTASIS) 0.05 % ophthalmic emulsion, fluorometholone (FML) 0.1 % ophthalmic suspension   fluticasone (FLONASE) 50 MCG/ACT nasal spray,  If needed: ALPRAZolam (XANAX) 1 MG tablet for anxiety, albuterol (PROVENTIL HFA;VENTOLIN HFA) 108 (90 BASE) MCG/ACT inhaler For wheezing ( Bring in on day of surgery) Stop taking Aspirin, Multivitamin capsule, and herbal medications (RASPBERRY KETONES PO), Omega-3 Fatty Acids (FISH OIL PO), (Melatonin 300 MCG TABS) Do not take any NSAIDs ie: Ibuprofen, Advil, Naproxen or any medication containing Aspirin ( meloxicam (MOBIC) 7.5 MG tablet)  Do not wear jewelry, make-up or nail polish.  Do not wear lotions, powders, or perfumes. You may wear deodorant.  Do not shave 48 hours prior to surgery  Do not bring valuables to the hospital.  [facility] is not responsible for any belongings or valuables.               Contacts, dentures or bridgework may not be worn into surgery.  Leave suitcase in the car. After surgery it may be brought to your room.  For patients admitted to the hospital, discharge time is determined by your treatment team.               Patients discharged the day of surgery will not be allowed to drive home.  Name and phone number of your driver:       Special Instructions: Shower using CHG 2 nights before surgery and the night before surgery.  If you shower the  day of surgery use CHG.  Use special wash - you have one bottle of CHG for all showers.  You should use approximately 1/3 of the bottle for each shower.   Please read over the following fact sheets that you were given: Pain Booklet, Coughing and Deep Breathing, MRSA Information and Surgical Site Infection Prevention

## 2012-11-03 NOTE — Progress Notes (Signed)
Anesthesia Chart Review:  Patient is a 67 year old [name] scheduled for right L2-3 microdiskectomy on 11/06/12 by Dr. Jordan Likes.  History includes obesity (BMI 34.7), former smoker, HTN (not requiring medication since 2011), chronic cough with allergic rhinitis, fibromyalgia, LLE DVT '10, chronic DOE, Bipolar affective disorder, migraines, DJD, cervical fusion, spinal cord stimulator, breast augmentation, cataract extraction, tonsillectomy. Pulmonologist is Dr. Delton Coombes last visit 08/04/12.  His note also mentions 1) history of RML pulmonary nodule with recent stable CT and no further evaluation recommended unless new changes, 2) vocal cord dysfunction with CPST suggesting no significant lower airway obstruction.  PCP is Dr. Joycelyn Rua.  EKG on 11/02/12 showed NSR, low voltage QRS, incomplete right BBB, cannot rule out anterior infarct (age undetermined). It was felt stable since at least 08/09/09.  Cardiopulmonary exercise stress test on 07/18/12 showed: Conclusion: Exercise testing with gas exchange demonstrates a normal functional capacity when compared to matched sedentary norms. There is a relative hyperventilation near peak exercise (relatively steep RCP), when VE/VCO2 is corrected to the RCP the slope remains significantly elevated at 37. Baseline EKG SR with incomplete right BBB.  No ST-T changes or arrhythmias. BP response was appropriate. The elevated VE/VCO2 slope and low HR response are concerning for an underlying circulatory limitation as primary problem. The most common type in obese individuals is diastolic dysfunction. In addition, Air flow limitation and obesity could be contributing.   Patient had a chest CT on 06/27/12, so I did not feel she would require a preoperative CXR.  Results showed: Stable pulmonary nodules as above since previous exam 2011. This is compatible with probable benignity and does not warrant further specific imaging follow-up according to consensus guidelines. No new acute  finding.  Preoperative labs noted.  Above reviewed with anesthesiologist Dr. Chaney Malling.  Patient has chronic DOE and cough, but no known chest pain.  Recent CPTS suggests diastolic dysfunction.  EKG is stable.  There is no known history of MI, DM.  She will be evaluated by her assigned anesthesiologist on the day of surgery, and if no acute cardiopulmonary symptoms then it is anticipated that she could proceed as planned.  [name]/Anesthesiology Phone [phone] 11/03/2012 2:41 PM

## 2012-11-05 MED ORDER — CEFAZOLIN SODIUM-DEXTROSE 2-3 GM-% IV SOLR
2.0000 g | INTRAVENOUS | Status: AC
  Administered 2012-11-06: 2 g via INTRAVENOUS
  Filled 2012-11-05: qty 50

## 2012-11-06 ENCOUNTER — Ambulatory Visit (HOSPITAL_COMMUNITY)
Admission: RE | Admit: 2012-11-06 | Discharge: 2012-11-07 | Disposition: A | Discharge status: Home / Self Care | Payer: Medicare Other | Source: Ambulatory Visit | Attending: Neurosurgery | Admitting: Neurosurgery

## 2012-11-06 ENCOUNTER — Encounter (HOSPITAL_COMMUNITY): Payer: Self-pay | Admitting: Neurosurgery

## 2012-11-06 ENCOUNTER — Inpatient Hospital Stay (HOSPITAL_COMMUNITY): Payer: Medicare Other | Admitting: Anesthesiology

## 2012-11-06 ENCOUNTER — Encounter (HOSPITAL_COMMUNITY): Payer: Self-pay | Admitting: Vascular Surgery

## 2012-11-06 ENCOUNTER — Encounter (HOSPITAL_COMMUNITY)
Admission: RE | Disposition: A | Discharge status: Home / Self Care | Payer: Self-pay | Source: Ambulatory Visit | Attending: Neurosurgery

## 2012-11-06 ENCOUNTER — Inpatient Hospital Stay (HOSPITAL_COMMUNITY): Payer: Medicare Other

## 2012-11-06 DIAGNOSIS — M5126 Other intervertebral disc displacement, lumbar region: Principal | ICD-10-CM | POA: Insufficient documentation

## 2012-11-06 DIAGNOSIS — Z79899 Other long term (current) drug therapy: Secondary | ICD-10-CM | POA: Insufficient documentation

## 2012-11-06 DIAGNOSIS — I10 Essential (primary) hypertension: Secondary | ICD-10-CM | POA: Insufficient documentation

## 2012-11-06 HISTORY — PX: LUMBAR LAMINECTOMY/DECOMPRESSION MICRODISCECTOMY: SHX5026

## 2012-11-06 SURGERY — LUMBAR LAMINECTOMY/DECOMPRESSION MICRODISCECTOMY 1 LEVEL
Anesthesia: General | Site: Back | Laterality: Right | Wound class: Clean

## 2012-11-06 MED ORDER — ONDANSETRON HCL 4 MG/2ML IJ SOLN: 4.0000 mg | INTRAMUSCULAR | Status: DC | PRN

## 2012-11-06 MED ORDER — GLYCOPYRROLATE 0.2 MG/ML IJ SOLN
INTRAMUSCULAR | Status: DC | PRN
  Administered 2012-11-06: 0.4 mg via INTRAVENOUS

## 2012-11-06 MED ORDER — NEOSTIGMINE METHYLSULFATE 1 MG/ML IJ SOLN
INTRAMUSCULAR | Status: DC | PRN
  Administered 2012-11-06: 3 mg via INTRAVENOUS

## 2012-11-06 MED ORDER — METHYLPHENIDATE HCL ER (LA) 10 MG PO CP24: 80.0000 mg | ORAL_CAPSULE | Freq: Every day | ORAL | Status: DC

## 2012-11-06 MED ORDER — ROCURONIUM BROMIDE 100 MG/10ML IV SOLN
INTRAVENOUS | Status: DC | PRN
  Administered 2012-11-06: 50 mg via INTRAVENOUS
  Administered 2012-11-06: 10 mg via INTRAVENOUS

## 2012-11-06 MED ORDER — MIDAZOLAM HCL 5 MG/5ML IJ SOLN
INTRAMUSCULAR | Status: DC | PRN
  Administered 2012-11-06: 2 mg via INTRAVENOUS

## 2012-11-06 MED ORDER — METHYLPHENIDATE HCL ER (OSM) 36 MG PO TBCR: 72.0000 mg | EXTENDED_RELEASE_TABLET | Freq: Every day | ORAL | Status: DC

## 2012-11-06 MED ORDER — PROPOFOL 10 MG/ML IV BOLUS
INTRAVENOUS | Status: DC | PRN
  Administered 2012-11-06: 100 mg via INTRAVENOUS
  Administered 2012-11-06: 40 mg via INTRAVENOUS

## 2012-11-06 MED ORDER — MECLIZINE HCL 25 MG PO TABS
25.0000 mg | ORAL_TABLET | Freq: Every day | ORAL | Status: DC
  Administered 2012-11-07: 25 mg via ORAL
  Filled 2012-11-06 (×3): qty 1

## 2012-11-06 MED ORDER — HYDROMORPHONE HCL PF 1 MG/ML IJ SOLN
INTRAMUSCULAR | Status: AC
  Administered 2012-11-06: 0.5 mg via INTRAVENOUS
  Filled 2012-11-06: qty 1

## 2012-11-06 MED ORDER — KETOROLAC TROMETHAMINE 30 MG/ML IJ SOLN
INTRAMUSCULAR | Status: DC | PRN
  Administered 2012-11-06: 30 mg via INTRAVENOUS

## 2012-11-06 MED ORDER — ACETAMINOPHEN 325 MG PO TABS: 650.0000 mg | ORAL_TABLET | ORAL | Status: DC | PRN

## 2012-11-06 MED ORDER — METHYLPHENIDATE HCL ER (OSM) 18 MG PO TBCR
72.0000 mg | EXTENDED_RELEASE_TABLET | Freq: Every day | ORAL | Status: DC
  Administered 2012-11-07: 72 mg via ORAL

## 2012-11-06 MED ORDER — MIDAZOLAM HCL 2 MG/2ML IJ SOLN: 0.5000 mg | Freq: Once | INTRAMUSCULAR | Status: DC | PRN

## 2012-11-06 MED ORDER — 0.9 % SODIUM CHLORIDE (POUR BTL) OPTIME
TOPICAL | Status: DC | PRN
  Administered 2012-11-06: 1000 mL

## 2012-11-06 MED ORDER — BUPROPION HCL ER (SR) 150 MG PO TB12
150.0000 mg | ORAL_TABLET | Freq: Three times a day (TID) | ORAL | Status: DC
  Administered 2012-11-06 – 2012-11-07 (×2): 150 mg via ORAL
  Filled 2012-11-06 (×4): qty 1

## 2012-11-06 MED ORDER — MUPIROCIN 2 % EX OINT: TOPICAL_OINTMENT | Freq: Two times a day (BID) | CUTANEOUS | Status: AC

## 2012-11-06 MED ORDER — PANTOPRAZOLE SODIUM 40 MG PO TBEC
40.0000 mg | DELAYED_RELEASE_TABLET | Freq: Every day | ORAL | Status: DC
  Administered 2012-11-07: 40 mg via ORAL
  Filled 2012-11-06: qty 1

## 2012-11-06 MED ORDER — SODIUM CHLORIDE 0.9 % IJ SOLN: 3.0000 mL | INTRAMUSCULAR | Status: DC | PRN

## 2012-11-06 MED ORDER — FLUOROMETHOLONE 0.1 % OP SUSP
1.0000 [drp] | Freq: Two times a day (BID) | OPHTHALMIC | Status: DC
  Filled 2012-11-06 (×2): qty 5

## 2012-11-06 MED ORDER — ONDANSETRON HCL 4 MG/2ML IJ SOLN
INTRAMUSCULAR | Status: DC | PRN
  Administered 2012-11-06: 4 mg via INTRAVENOUS

## 2012-11-06 MED ORDER — OXYCODONE HCL 5 MG PO TABS: 5.0000 mg | ORAL_TABLET | Freq: Once | ORAL | Status: DC | PRN

## 2012-11-06 MED ORDER — FLUTICASONE PROPIONATE 50 MCG/ACT NA SUSP
2.0000 | Freq: Every day | NASAL | Status: DC
  Filled 2012-11-06: qty 16

## 2012-11-06 MED ORDER — DEXAMETHASONE SODIUM PHOSPHATE 10 MG/ML IJ SOLN
INTRAMUSCULAR | Status: AC
  Administered 2012-11-06: 10 mg via INTRAVENOUS
  Filled 2012-11-06: qty 1

## 2012-11-06 MED ORDER — FLUOXETINE HCL 10 MG PO CAPS
10.0000 mg | ORAL_CAPSULE | Freq: Every day | ORAL | Status: DC
  Administered 2012-11-07: 10 mg via ORAL
  Filled 2012-11-06: qty 1

## 2012-11-06 MED ORDER — SCOPOLAMINE 1 MG/3DAYS TD PT72
MEDICATED_PATCH | TRANSDERMAL | Status: AC
  Filled 2012-11-06: qty 1

## 2012-11-06 MED ORDER — SODIUM CHLORIDE 0.9 % IV SOLN
250.0000 mL | INTRAVENOUS | Status: DC
  Administered 2012-11-07 (×2): 250 mL via INTRAVENOUS

## 2012-11-06 MED ORDER — MEPERIDINE HCL 25 MG/ML IJ SOLN: 6.2500 mg | INTRAMUSCULAR | Status: DC | PRN

## 2012-11-06 MED ORDER — FUROSEMIDE 40 MG PO TABS
40.0000 mg | ORAL_TABLET | Freq: Three times a day (TID) | ORAL | Status: DC
  Administered 2012-11-07: 40 mg via ORAL
  Filled 2012-11-06 (×4): qty 1

## 2012-11-06 MED ORDER — ALUM & MAG HYDROXIDE-SIMETH 200-200-20 MG/5ML PO SUSP: 30.0000 mL | Freq: Four times a day (QID) | ORAL | Status: DC | PRN

## 2012-11-06 MED ORDER — SCOPOLAMINE 1 MG/3DAYS TD PT72
1.0000 | MEDICATED_PATCH | TRANSDERMAL | Status: DC
Start: 2012-11-06 — End: 2012-11-06

## 2012-11-06 MED ORDER — LAMOTRIGINE 100 MG PO TABS
100.0000 mg | ORAL_TABLET | Freq: Every day | ORAL | Status: DC
Start: 2012-11-06 — End: 2012-11-07
  Administered 2012-11-07: 100 mg via ORAL
  Filled 2012-11-06 (×3): qty 1

## 2012-11-06 MED ORDER — BUPIVACAINE HCL (PF) 0.25 % IJ SOLN
INTRAMUSCULAR | Status: DC | PRN
  Administered 2012-11-06: 20 mL

## 2012-11-06 MED ORDER — HYDROMORPHONE HCL 2 MG PO TABS
2.0000 mg | ORAL_TABLET | Freq: Two times a day (BID) | ORAL | Status: DC
  Administered 2012-11-06 – 2012-11-07 (×2): 2 mg via ORAL
  Filled 2012-11-06 (×2): qty 1

## 2012-11-06 MED ORDER — FENTANYL CITRATE 0.05 MG/ML IJ SOLN
INTRAMUSCULAR | Status: DC | PRN
  Administered 2012-11-06: 100 ug via INTRAVENOUS
  Administered 2012-11-06: 150 ug via INTRAVENOUS

## 2012-11-06 MED ORDER — HYDROMORPHONE HCL PF 1 MG/ML IJ SOLN
0.2500 mg | INTRAMUSCULAR | Status: DC | PRN
  Administered 2012-11-06 (×4): 0.5 mg via INTRAVENOUS

## 2012-11-06 MED ORDER — KETOROLAC TROMETHAMINE 30 MG/ML IJ SOLN
30.0000 mg | Freq: Four times a day (QID) | INTRAMUSCULAR | Status: DC
  Administered 2012-11-07 (×2): 30 mg via INTRAVENOUS
  Filled 2012-11-06 (×6): qty 1

## 2012-11-06 MED ORDER — LACTATED RINGERS IV SOLN
INTRAVENOUS | Status: DC | PRN
  Administered 2012-11-06 (×2): via INTRAVENOUS

## 2012-11-06 MED ORDER — FAMOTIDINE 20 MG PO TABS
20.0000 mg | ORAL_TABLET | Freq: Two times a day (BID) | ORAL | Status: DC
Start: 2012-11-06 — End: 2012-11-07
  Administered 2012-11-07 (×2): 20 mg via ORAL
  Filled 2012-11-06 (×4): qty 1

## 2012-11-06 MED ORDER — SENNA 8.6 MG PO TABS
1.0000 | ORAL_TABLET | Freq: Two times a day (BID) | ORAL | Status: DC
  Filled 2012-11-06 (×2): qty 1

## 2012-11-06 MED ORDER — PROMETHAZINE HCL 25 MG PO TABS
25.0000 mg | ORAL_TABLET | Freq: Four times a day (QID) | ORAL | Status: DC | PRN
  Administered 2012-11-06: 25 mg via ORAL
  Filled 2012-11-06: qty 1

## 2012-11-06 MED ORDER — PREGABALIN 100 MG PO CAPS
100.0000 mg | ORAL_CAPSULE | Freq: Every day | ORAL | Status: DC
  Administered 2012-11-06: 100 mg via ORAL
  Filled 2012-11-06: qty 1

## 2012-11-06 MED ORDER — DEXTROSE 5 % IV SOLN
INTRAVENOUS | Status: DC | PRN
  Administered 2012-11-06: 17:00:00 via INTRAVENOUS

## 2012-11-06 MED ORDER — POLYETHYL GLYCOL-PROPYL GLYCOL 0.4-0.3 % OP SOLN: 1.0000 [drp] | Freq: Three times a day (TID) | OPHTHALMIC | Status: DC

## 2012-11-06 MED ORDER — OXYCODONE HCL 5 MG/5ML PO SOLN: 5.0000 mg | Freq: Once | ORAL | Status: DC | PRN

## 2012-11-06 MED ORDER — FENTANYL CITRATE 0.05 MG/ML IJ SOLN
INTRAMUSCULAR | Status: AC
  Filled 2012-11-06: qty 2

## 2012-11-06 MED ORDER — HEMOSTATIC AGENTS (NO CHARGE) OPTIME
TOPICAL | Status: DC | PRN
  Administered 2012-11-06: 1 via TOPICAL

## 2012-11-06 MED ORDER — ALPRAZOLAM 0.5 MG PO TABS
1.0000 mg | ORAL_TABLET | Freq: Every day | ORAL | Status: DC | PRN
  Filled 2012-11-06: qty 2

## 2012-11-06 MED ORDER — LACTATED RINGERS IV SOLN
INTRAVENOUS | Status: DC
  Administered 2012-11-06: 11:00:00 via INTRAVENOUS

## 2012-11-06 MED ORDER — ALPRAZOLAM 0.5 MG PO TABS
2.0000 mg | ORAL_TABLET | Freq: Every day | ORAL | Status: DC
  Administered 2012-11-06: 2 mg via ORAL
  Filled 2012-11-06: qty 4

## 2012-11-06 MED ORDER — DEXAMETHASONE SODIUM PHOSPHATE 10 MG/ML IJ SOLN: 10.0000 mg | INTRAMUSCULAR | Status: DC

## 2012-11-06 MED ORDER — OLOPATADINE HCL 0.1 % OP SOLN
1.0000 [drp] | Freq: Two times a day (BID) | OPHTHALMIC | Status: DC
  Filled 2012-11-06: qty 5

## 2012-11-06 MED ORDER — MENTHOL 3 MG MT LOZG: 1.0000 | LOZENGE | OROMUCOSAL | Status: DC | PRN

## 2012-11-06 MED ORDER — LIDOCAINE HCL (CARDIAC) 20 MG/ML IV SOLN
INTRAVENOUS | Status: DC | PRN
  Administered 2012-11-06: 40 mg via INTRAVENOUS

## 2012-11-06 MED ORDER — THROMBIN 5000 UNITS EX SOLR
CUTANEOUS | Status: DC | PRN
  Administered 2012-11-06 (×2): 5000 [IU] via TOPICAL

## 2012-11-06 MED ORDER — BUTALBITAL-APAP-CAFFEINE 50-325-40 MG PO TABS: 1.0000 | ORAL_TABLET | Freq: Two times a day (BID) | ORAL | Status: DC | PRN

## 2012-11-06 MED ORDER — PROMETHAZINE HCL 25 MG/ML IJ SOLN: 6.2500 mg | INTRAMUSCULAR | Status: DC | PRN

## 2012-11-06 MED ORDER — HYDROCODONE-ACETAMINOPHEN 5-325 MG PO TABS: 1.0000 | ORAL_TABLET | ORAL | Status: DC | PRN

## 2012-11-06 MED ORDER — SODIUM CHLORIDE 0.9 % IJ SOLN
3.0000 mL | Freq: Two times a day (BID) | INTRAMUSCULAR | Status: DC
  Administered 2012-11-06: 3 mL via INTRAVENOUS

## 2012-11-06 MED ORDER — ALPRAZOLAM 0.5 MG PO TABS: 1.0000 mg | ORAL_TABLET | Freq: Two times a day (BID) | ORAL | Status: DC | PRN

## 2012-11-06 MED ORDER — ARIPIPRAZOLE 10 MG PO TABS
10.0000 mg | ORAL_TABLET | Freq: Every day | ORAL | Status: DC
  Administered 2012-11-06: 10 mg via ORAL
  Filled 2012-11-06 (×2): qty 1

## 2012-11-06 MED ORDER — CYCLOSPORINE 0.05 % OP EMUL
1.0000 [drp] | Freq: Two times a day (BID) | OPHTHALMIC | Status: DC
  Administered 2012-11-06 – 2012-11-07 (×2): 1 [drp] via OPHTHALMIC
  Filled 2012-11-06 (×3): qty 1

## 2012-11-06 MED ORDER — ACETAMINOPHEN 650 MG RE SUPP: 650.0000 mg | RECTAL | Status: DC | PRN

## 2012-11-06 MED ORDER — PRAMIPEXOLE DIHYDROCHLORIDE 0.25 MG PO TABS
0.5000 mg | ORAL_TABLET | Freq: Every day | ORAL | Status: DC
  Administered 2012-11-06: 0.5 mg via ORAL
  Filled 2012-11-06 (×2): qty 2

## 2012-11-06 MED ORDER — ZONISAMIDE 100 MG PO CAPS
200.0000 mg | ORAL_CAPSULE | Freq: Every day | ORAL | Status: DC
Start: 2012-11-06 — End: 2012-11-07
  Administered 2012-11-06: 200 mg via ORAL
  Filled 2012-11-06 (×2): qty 2

## 2012-11-06 MED ORDER — EZETIMIBE 10 MG PO TABS
10.0000 mg | ORAL_TABLET | Freq: Every day | ORAL | Status: DC
Start: 2012-11-06 — End: 2012-11-07
  Administered 2012-11-06: 10 mg via ORAL
  Filled 2012-11-06 (×2): qty 1

## 2012-11-06 MED ORDER — CYCLOBENZAPRINE HCL 10 MG PO TABS
20.0000 mg | ORAL_TABLET | Freq: Every day | ORAL | Status: DC
Start: 2012-11-06 — End: 2012-11-07
  Administered 2012-11-06: 20 mg via ORAL
  Filled 2012-11-06 (×2): qty 2

## 2012-11-06 MED ORDER — PHENOL 1.4 % MT LIQD: 1.0000 | OROMUCOSAL | Status: DC | PRN

## 2012-11-06 MED ORDER — POLYVINYL ALCOHOL 1.4 % OP SOLN
1.0000 [drp] | Freq: Three times a day (TID) | OPHTHALMIC | Status: DC
  Filled 2012-11-06: qty 15

## 2012-11-06 MED ORDER — SODIUM CHLORIDE 0.9 % IR SOLN
Status: DC | PRN
  Administered 2012-11-06: 17:00:00

## 2012-11-06 MED ORDER — OXYCODONE-ACETAMINOPHEN 5-325 MG PO TABS: 1.0000 | ORAL_TABLET | ORAL | Status: DC | PRN

## 2012-11-06 MED ORDER — CEFAZOLIN SODIUM 1-5 GM-% IV SOLN
1.0000 g | Freq: Three times a day (TID) | INTRAVENOUS | Status: DC
  Administered 2012-11-07: 1 g via INTRAVENOUS
  Filled 2012-11-06 (×3): qty 50

## 2012-11-06 MED ORDER — ALBUTEROL SULFATE HFA 108 (90 BASE) MCG/ACT IN AERS: 2.0000 | INHALATION_SPRAY | Freq: Four times a day (QID) | RESPIRATORY_TRACT | Status: DC | PRN

## 2012-11-06 MED ORDER — HYDROMORPHONE HCL PF 1 MG/ML IJ SOLN: 0.5000 mg | INTRAMUSCULAR | Status: DC | PRN

## 2012-11-06 MED ORDER — CYCLOBENZAPRINE HCL 10 MG PO TABS: 10.0000 mg | ORAL_TABLET | Freq: Three times a day (TID) | ORAL | Status: DC | PRN

## 2012-11-06 SURGICAL SUPPLY — 52 items
ADH SKN CLS APL DERMABOND .7 (GAUZE/BANDAGES/DRESSINGS) ×1
ADH SKN CLS LQ APL DERMABOND (GAUZE/BANDAGES/DRESSINGS) ×1
APL SKNCLS STERI-STRIP NONHPOA (GAUZE/BANDAGES/DRESSINGS) ×1
BAG DECANTER FOR FLEXI CONT (MISCELLANEOUS) ×2 IMPLANT
BENZOIN TINCTURE PRP APPL 2/3 (GAUZE/BANDAGES/DRESSINGS) ×2 IMPLANT
BLADE SURG ROTATE 9660 (MISCELLANEOUS) IMPLANT
BRUSH SCRUB EZ PLAIN DRY (MISCELLANEOUS) ×2 IMPLANT
BUR CUTTER 7.0 ROUND (BURR) ×2 IMPLANT
CANISTER SUCTION 2500CC (MISCELLANEOUS) ×2 IMPLANT
CLOTH BEACON ORANGE TIMEOUT ST (SAFETY) ×2 IMPLANT
CONT SPEC 4OZ CLIKSEAL STRL BL (MISCELLANEOUS) ×2 IMPLANT
DECANTER SPIKE VIAL GLASS SM (MISCELLANEOUS) ×2 IMPLANT
DERMABOND ADHESIVE PROPEN (GAUZE/BANDAGES/DRESSINGS) ×1
DERMABOND ADVANCED (GAUZE/BANDAGES/DRESSINGS) ×1
DERMABOND ADVANCED .7 DNX12 (GAUZE/BANDAGES/DRESSINGS) ×1 IMPLANT
DERMABOND ADVANCED .7 DNX6 (GAUZE/BANDAGES/DRESSINGS) IMPLANT
DRAPE LAPAROTOMY 100X72X124 (DRAPES) ×2 IMPLANT
DRAPE MICROSCOPE ZEISS OPMI (DRAPES) ×2 IMPLANT
DRAPE POUCH INSTRU U-SHP 10X18 (DRAPES) ×2 IMPLANT
DRAPE PROXIMA HALF (DRAPES) IMPLANT
DRAPE SURG 17X23 STRL (DRAPES) ×4 IMPLANT
DRSG OPSITE 4X5.5 SM (GAUZE/BANDAGES/DRESSINGS) ×1 IMPLANT
DURAPREP 26ML APPLICATOR (WOUND CARE) ×2 IMPLANT
ELECT REM PT RETURN 9FT ADLT (ELECTROSURGICAL) ×2
ELECTRODE REM PT RTRN 9FT ADLT (ELECTROSURGICAL) ×1 IMPLANT
GAUZE SPONGE 4X4 16PLY XRAY LF (GAUZE/BANDAGES/DRESSINGS) IMPLANT
GLOVE ECLIPSE 8.5 STRL (GLOVE) ×2 IMPLANT
GLOVE EXAM NITRILE LRG STRL (GLOVE) IMPLANT
GLOVE EXAM NITRILE MD LF STRL (GLOVE) IMPLANT
GLOVE EXAM NITRILE XL STR (GLOVE) IMPLANT
GLOVE EXAM NITRILE XS STR PU (GLOVE) IMPLANT
GOWN BRE IMP SLV AUR LG STRL (GOWN DISPOSABLE) IMPLANT
GOWN BRE IMP SLV AUR XL STRL (GOWN DISPOSABLE) ×2 IMPLANT
GOWN STRL REIN 2XL LVL4 (GOWN DISPOSABLE) IMPLANT
KIT BASIN OR (CUSTOM PROCEDURE TRAY) ×2 IMPLANT
KIT ROOM TURNOVER OR (KITS) ×2 IMPLANT
NDL SPNL 22GX3.5 QUINCKE BK (NEEDLE) ×1 IMPLANT
NEEDLE HYPO 22GX1.5 SAFETY (NEEDLE) ×2 IMPLANT
NEEDLE SPNL 22GX3.5 QUINCKE BK (NEEDLE) ×2 IMPLANT
NS IRRIG 1000ML POUR BTL (IV SOLUTION) ×2 IMPLANT
PACK LAMINECTOMY NEURO (CUSTOM PROCEDURE TRAY) ×2 IMPLANT
PAD ARMBOARD 7.5X6 YLW CONV (MISCELLANEOUS) ×6 IMPLANT
RUBBERBAND STERILE (MISCELLANEOUS) ×4 IMPLANT
SPONGE GAUZE 4X4 12PLY (GAUZE/BANDAGES/DRESSINGS) ×2 IMPLANT
SPONGE SURGIFOAM ABS GEL SZ50 (HEMOSTASIS) ×2 IMPLANT
STRIP CLOSURE SKIN 1/2X4 (GAUZE/BANDAGES/DRESSINGS) ×2 IMPLANT
SUT VIC AB 2-0 CT1 18 (SUTURE) ×2 IMPLANT
SUT VIC AB 3-0 SH 8-18 (SUTURE) ×2 IMPLANT
SYR 20ML ECCENTRIC (SYRINGE) ×2 IMPLANT
TOWEL OR 17X24 6PK STRL BLUE (TOWEL DISPOSABLE) ×2 IMPLANT
TOWEL OR 17X26 10 PK STRL BLUE (TOWEL DISPOSABLE) ×2 IMPLANT
WATER STERILE IRR 1000ML POUR (IV SOLUTION) ×2 IMPLANT

## 2012-11-06 NOTE — Transfer of Care (Signed)
Immediate Anesthesia Transfer of Care Note  Patient: [name]  Procedure(s) Performed: Procedure(s): LUMBAR TWO THREE LUMBAR LAMINECTOMY/DECOMPRESSION MICRODISCECTOMY 1 LEVEL (Right)  Patient Location: PACU  Anesthesia Type:General  Level of Consciousness: awake, alert , oriented and patient cooperative  Airway & Oxygen Therapy: Patient Spontanous Breathing and Patient connected to nasal cannula oxygen  Post-op Assessment: Report given to PACU RN and Post -op Vital signs reviewed and stable  Post vital signs: Reviewed and stable  Complications: No apparent anesthesia complications

## 2012-11-06 NOTE — Op Note (Signed)
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  Asst. Surgeon: Danielle Dess  Anesthesia: General  Indication: 67 year old female with a right-sided L2-3 disc herniation and resultant radiculopathy failing conservative management. She presents now for microdiscectomy in hopes of improving her symptoms.  Operative note: After induction anesthesia, patient positioned prone onto Wilson frame and appropriately padded. Lumbar region prepped and draped. Incision made overlying L2-3 interspace. Supper off dissection performed on the right side. Lamina and facet joints L2-3 exposed. Retractor placed. X-ray taken. Level confirmed. Laminotomy performed using high-speed drill and Kerrison rongeurs to remove the inferior aspect of lamina of L2 medial aspect the L2-3 facet joint and the superior rim of the L3 lamina. Ligament flavum was elevated and resected so fashion using Kerrison rongeurs. Underlying thecal sac and exiting L3 nerve root identified. Microscope brought field these at the remainder of the discectomy for microdissection of the spinal canal. Epidermides was quite good and cut. Thecal sac and L3 nerve root gently mobilized track towards midline. Disc herniation was readily apparent. Large free fragment was encountered this is dissected free and removed using micro pituitaries. Further disc material was dissected free and removed. This point a very thorough discectomy been achieved. There is no his injury to thecal sac and nerve repair wound is then irrigated with and bike solution. Gelfoam with postoperative hemostasis which be good. Microscope and retractor system were removed. Hemostasis muscle she will and was and close in layers with Vicryl sutures. Steri-Strips and  sterile dressing were applied. There were no apparent complications. Patient tolerated the procedure well and she returns to the recovery room postop.

## 2012-11-06 NOTE — Anesthesia Procedure Notes (Signed)
Procedure Name: Intubation Date/Time: 11/06/2012 4:15 PM Performed by: Darcey Nora B Pre-anesthesia Checklist: Patient identified, Patient being monitored, Emergency Drugs available and Suction available Patient Re-evaluated:Patient Re-evaluated prior to inductionOxygen Delivery Method: Circle system utilized Preoxygenation: Pre-oxygenation with 100% oxygen Intubation Type: IV induction Ventilation: Mask ventilation without difficulty and Oral airway inserted - appropriate to patient size Laryngoscope Size: Mac and 3 Grade View: Grade I Tube type: Oral Tube size: 7.5 mm Number of attempts: 1 Airway Equipment and Method: Stylet Placement Confirmation: ETT inserted through vocal cords under direct vision,  breath sounds checked- equal and bilateral and positive ETCO2 Secured at: 21 (cm at gum) cm Tube secured with: Tape Dental Injury: Teeth and Oropharynx as per pre-operative assessment

## 2012-11-06 NOTE — H&P (Signed)
[name] is an 67 y.o. female.   Chief Complaint: Right thigh pain HPI: 67 year old female status post previous lumbar decompression and fusion L4-5 presents now with significant back and right lower extremity pain. Pain radiates to her right groin and anterior thigh. His x-ray by standing or walking. She has minimal if any left-sided symptoms. She has no bowel bladder dysfunction. Workup demonstrates evidence of a new right-sided L2-3 disc herniation with compression the thecal sac and right L3 nerve root. The space at L3-4 screws the healthy. Fusion L4-5 appears to be well-healed. Patient presents now for right L2-3 laminotomy and microdiscectomy in hopes of improving her symptoms.  Past Medical History  Diagnosis Date  . Cough   . Migraine   . Fibromyalgia   . Bipolar affective disorder   . GERD (gastroesophageal reflux disease)   . Left leg DVT 2010  . DJD (degenerative joint disease) of lumbar spine   . PONV (postoperative nausea and vomiting)   . Spinal headache   . Family history of anesthesia complication     Hx: father has nausea  . Shortness of breath     Hx; of exertion  . Hypertension     Hx: of in 2011  . Pneumonia   . Vertigo     Hx: of    Past Surgical History  Procedure Laterality Date  . Cholecystectomy  1989  . Tonsillectomy  1955  . Partial hysterectomy  1980  . Breast enhancement surgery  1989  . Cervical fusion  2005  . Spinal cord stimulator implant  2010  . Carpal tunnel release  2006    Right hand  . Tarsal suspension      Hx; of left thumb  . Cataract extraction w/ intraocular lens  implant, bilateral      Hx: of  . Appendectomy    . Colonoscopy w/ biopsies and polypectomy      Hx: of  . Upper gi endoscopy      Hx: of    Family History  Problem Relation Age of Onset  . Lung cancer Mother   . Prostate cancer Father   . Rheum arthritis Paternal Grandmother   . Asthma Sister   . Heart disease Father    Social History:  reports that she  quit smoking about 26 years ago. Her smoking use included Cigarettes. She has a 52.5 pack-year smoking history. She has never used smokeless tobacco. She reports that she does not drink alcohol or use illicit drugs.  Allergies:  Allergies  Allergen Reactions  . Lithium Other (See Comments)    Migraines and "drunk"  . Lisinopril Other (See Comments)    kidney failure  . Statins Other (See Comments)    Very weak and achy  . Adhesive [Tape] Rash    All tape, paper included.    Medications Prior to Admission  Medication Sig Dispense Refill  . albuterol (PROVENTIL HFA;VENTOLIN HFA) 108 (90 BASE) MCG/ACT inhaler Inhale 2 puffs into the lungs every 6 (six) hours as needed for wheezing.      Marland Kitchen ALPRAZolam (XANAX) 1 MG tablet Take 1-2 mg by mouth 2 (two) times daily as needed for sleep or anxiety (Takes 1 tablet daily if needed for anxiety, and takes 2 tablets every night at bedtime.).       Marland Kitchen ARIPiprazole (ABILIFY) 10 MG tablet Take 10 mg by mouth at bedtime.       . Ascorbic Acid (VITAMIN C GUMMIE PO) Take 1 tablet by mouth  daily.      . B Complex Vitamins (VITAMIN B COMPLEX PO) Take 1 tablet by mouth daily.      . Bepotastine Besilate (BEPREVE) 1.5 % SOLN Place 1 drop into both eyes 3 (three) times daily as needed (for dry eyes).       Marland Kitchen buPROPion (BUDEPRION SR) 150 MG 12 hr tablet Take 150 mg by mouth 3 (three) times daily.        . butalbital-acetaminophen-caffeine (FIORICET, ESGIC) 50-325-40 MG per tablet Take 1 tablet by mouth 2 (two) times daily as needed for headache.       . Calcium-Phosphorus-Vitamin D (CALCIUM GUMMIES PO) Take 2 tablets by mouth daily.      . Cholecalciferol (VITAMIN D3 ADULT GUMMIES) 1000 UNITS CHEW Chew 1,000 Units by mouth daily.      . cyclobenzaprine (FLEXERIL) 10 MG tablet Take 20 mg by mouth at bedtime.       . cycloSPORINE (RESTASIS) 0.05 % ophthalmic emulsion Place 1 drop into both eyes 2 (two) times daily.      . Diclofenac Sodium (PENNSAID) 1.5 % SOLN Place  1 drop into both eyes daily.       Marland Kitchen DOCUSATE SODIUM PO Take 1 tablet by mouth once a week. Takes "near the end of each week."      . esomeprazole (NEXIUM) 40 MG capsule Take 40 mg by mouth daily before breakfast.        . ezetimibe (ZETIA) 10 MG tablet Take 10 mg by mouth at bedtime.       . famotidine (PEPCID) 20 MG tablet Take 1 tablet (20 mg total) by mouth 2 (two) times daily.  30 tablet  0  . fluorometholone (FML) 0.1 % ophthalmic suspension Place 1 drop into both eyes 2 (two) times daily.       Marland Kitchen FLUoxetine (PROZAC) 10 MG capsule Take 10 mg by mouth daily.      . fluticasone (FLONASE) 50 MCG/ACT nasal spray Place 2 sprays into the nose 2 (two) times daily.  16 g  3  . furosemide (LASIX) 40 MG tablet Take 40 mg by mouth 3 (three) times daily.        Marland Kitchen HYDROmorphone (DILAUDID) 2 MG tablet Take 2 mg by mouth 2 (two) times daily.        Marland Kitchen ibuprofen (ADVIL,MOTRIN) 200 MG tablet Take 200 mg by mouth every 6 (six) hours as needed.        . lamoTRIgine (LAMICTAL) 100 MG tablet Take 100 mg by mouth at bedtime.        . Loratadine 10 MG CAPS Take 1 capsule by mouth daily.        . meclizine (ANTIVERT) 25 MG tablet Take 25 mg by mouth at bedtime.       . Melatonin 300 MCG TABS Take 1 tablet by mouth at bedtime as needed (for sleep).       . meloxicam (MOBIC) 7.5 MG tablet Take 2 tablets (15 mg total) by mouth daily.  30 tablet  0  . methylphenidate (RITALIN LA) 40 MG 24 hr capsule Take 80 mg by mouth daily.      . Multiple Vitamin (MULTIVITAMIN) capsule Take 1 capsule by mouth daily.        . Omega-3 Fatty Acids (FISH OIL PO) Take 2 tablets by mouth daily.      Marland Kitchen omeprazole (PRILOSEC) 20 MG capsule Take 20 mg by mouth at bedtime.        Marland Kitchen  Polyethyl Glycol-Propyl Glycol (SYSTANE) 0.4-0.3 % SOLN Place 1 drop into both eyes 3 (three) times daily.       . potassium gluconate 595 MG TABS Take 595 mg by mouth 3 (three) times daily.       . pramipexole (MIRAPEX) 0.5 MG tablet Take 0.5 mg by mouth at  bedtime.        . pregabalin (LYRICA) 100 MG capsule Take 100 mg by mouth at bedtime.        . promethazine (PHENERGAN) 25 MG tablet Take 25 mg by mouth every 6 (six) hours as needed for nausea.       Marland Kitchen RASPBERRY KETONES PO Take 4 tablets by mouth 2 (two) times daily.      Marland Kitchen zonisamide (ZONEGRAN) 100 MG capsule Take 200 mg by mouth at bedtime.         No results found for this or any previous visit (from the past 48 hour(s)). No results found.  Review of Systems  Constitutional: Negative.   HENT: Negative.   Eyes: Negative.   Respiratory: Negative.   Cardiovascular: Negative.   Gastrointestinal: Negative.   Genitourinary: Negative.   Musculoskeletal: Negative.   Skin: Negative.   Neurological: Negative.   Endo/Heme/Allergies: Negative.   Psychiatric/Behavioral: Negative.     Blood pressure 138/74, pulse 75, temperature 97.1 F (36.2 C), temperature source Oral, resp. rate 18, SpO2 100.00%. Physical Exam  Constitutional: She is oriented to person, place, and time. She appears well-developed and well-nourished.  HENT:  Head: Normocephalic and atraumatic.  Right Ear: External ear normal.  Left Ear: External ear normal.  Nose: Nose normal.  Mouth/Throat: Oropharynx is clear and moist. No oropharyngeal exudate.  Eyes: Conjunctivae and EOM are normal. Pupils are equal, round, and reactive to light. Right eye exhibits no discharge. Left eye exhibits no discharge.  Neck: Normal range of motion. Neck supple. No tracheal deviation present. No thyromegaly present.  Cardiovascular: Normal rate, regular rhythm, normal heart sounds and intact distal pulses.  Exam reveals no friction rub.   No murmur heard. Respiratory: Effort normal and breath sounds normal. No respiratory distress. She has no wheezes.  GI: Soft. Bowel sounds are normal. She exhibits no distension. There is no tenderness.  Musculoskeletal: Normal range of motion. She exhibits no edema and no tenderness.  Neurological: She  is alert and oriented to person, place, and time. She has normal reflexes. No cranial nerve deficit. Coordination normal.  Skin: Skin is warm and dry. No rash noted. No erythema.  Psychiatric: She has a normal mood and affect. Her behavior is normal. Judgment and thought content normal.     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 Plan right L2-3 laminotomy and microdiscectomy. Risks and benefits have been explained. Patient wishes to proceed.  Nadir Vasques A 11/06/2012, 1:05 PM

## 2012-11-06 NOTE — Anesthesia Postprocedure Evaluation (Signed)
  Anesthesia Post-op Note  Patient: [name]  Procedure(s) Performed: Procedure(s): LUMBAR TWO THREE LUMBAR LAMINECTOMY/DECOMPRESSION MICRODISCECTOMY 1 LEVEL (Right)  Patient Location: PACU  Anesthesia Type:General  Level of Consciousness: awake, alert , oriented and patient cooperative  Airway and Oxygen Therapy: Patient Spontanous Breathing and Patient connected to nasal cannula oxygen  Post-op Pain: mild  Post-op Assessment: Post-op Vital signs reviewed, Patient's Cardiovascular Status Stable, Respiratory Function Stable, Patent Airway, No signs of Nausea or vomiting and Pain level controlled  Post-op Vital Signs: Reviewed and stable  Complications: possible intra-operative recall, patient thinks remembers shots in her back after positioning, prior to incision,  Reassured patient and she is not upset or anxious

## 2012-11-06 NOTE — Brief Op Note (Signed)
11/06/2012  5:02 PM  PATIENT:  [name]  67 y.o. female  PRE-OPERATIVE DIAGNOSIS:  Herniated Nucleus Pulposus  POST-OPERATIVE DIAGNOSIS:  Herniated Nucleus Pulposus  PROCEDURE:  Procedure(s): LUMBAR LAMINECTOMY/DECOMPRESSION MICRODISCECTOMY 1 LEVEL (Right)  SURGEON:  Surgeon(s) and Role:    * Temple Pacini, MD - Primary    * Barnett Abu, MD - Assisting  PHYSICIAN ASSISTANT:   ASSISTANTS:E ANESTHESIA:   general  EBL:  Total I/O In: 1050 [I.V.:1050] Out: 50 [Blood:50]  BLOOD ADMINISTERED:none  DRAINS: none   LOCAL MEDICATIONS USED:  MARCAINE     SPECIMEN:  No Specimen  DISPOSITION OF SPECIMEN:  N/A  COUNTS:  YES  TOURNIQUET:  * No tourniquets in log *  DICTATION: .Dragon Dictation  PLAN OF CARE: Admit for overnight observation  PATIENT DISPOSITION:  PACU - hemodynamically stable.   Delay start of Pharmacological VTE agent (>24hrs) due to surgical blood loss or risk of bleeding: yes

## 2012-11-06 NOTE — Anesthesia Preprocedure Evaluation (Addendum)
Anesthesia Evaluation  Patient identified by MRN, date of birth, ID band Patient awake    Reviewed: Allergy & Precautions, H&P , NPO status , Patient's Chart, lab work & pertinent test results  History of Anesthesia Complications (+) PONV  Airway Mallampati: II TM Distance: >3 FB Neck ROM: Full    Dental  (+) Edentulous Upper and Edentulous Lower   Pulmonary shortness of breath, former smoker,  breath sounds clear to auscultation  Pulmonary exam normal       Cardiovascular hypertension, Pt. on medications DVT Rhythm:Regular Rate:Normal  6/14 exercise stress test: no ST changes   Neuro/Psych  Headaches, Bipolar Disorder Chronic pain: spinal cord stimulator, narcotics    GI/Hepatic Neg liver ROS, GERD-  Medicated and Controlled,  Endo/Other  negative endocrine ROSMorbid obesity  Renal/GU negative Renal ROS     Musculoskeletal  (+) Fibromyalgia -, narcotic dependent  Abdominal (+) + obese,   Peds  Hematology   Anesthesia Other Findings   Reproductive/Obstetrics                         Anesthesia Physical Anesthesia Plan  ASA: III  Anesthesia Plan: General   Post-op Pain Management:    Induction: Intravenous  Airway Management Planned: Oral ETT  Additional Equipment:   Intra-op Plan:   Post-operative Plan: Extubation in OR  Informed Consent: I have reviewed the patients History and Physical, chart, labs and discussed the procedure including the risks, benefits and alternatives for the proposed anesthesia with the patient or authorized representative who has indicated his/her understanding and acceptance.   Dental advisory given  Plan Discussed with: CRNA and Surgeon  Anesthesia Plan Comments: (Plan routine monitors, GETA)        Anesthesia Quick Evaluation

## 2012-11-07 ENCOUNTER — Encounter (HOSPITAL_COMMUNITY): Payer: Self-pay | Admitting: Neurosurgery

## 2012-11-07 MED ORDER — PROMETHAZINE HCL 25 MG PO TABS: 25.0000 mg | ORAL_TABLET | Freq: Four times a day (QID) | ORAL | Status: DC | PRN

## 2012-11-07 MED ORDER — HYDROMORPHONE HCL 2 MG PO TABS: 2.0000 mg | ORAL_TABLET | Freq: Four times a day (QID) | ORAL | Status: DC | PRN

## 2012-11-07 NOTE — Discharge Summary (Signed)
Physician Discharge Summary  Patient ID: [name] MRN: [id number] DOB/AGE: 1945-08-19 67 y.o.  Admit date: 11/06/2012 Discharge date: 11/07/2012  Admission Diagnoses:  Discharge Diagnoses:  Principal Problem:   HNP (herniated nucleus pulposus), lumbar   Discharged Condition: good  Hospital Course: Patient admitted to the hospital where she underwent uncomplicated right L2-3 laminotomy and microdiscectomy. Postoperative she is doing well. Preoperative back and leg pain significantly better. Up ambulating without difficulty and ready for discharge home.  Consults:   Significant Diagnostic Studies:   Treatments:   Discharge Exam: Blood pressure 87/60, pulse 57, temperature 97.8 F (36.6 C), temperature source Oral, resp. rate 18, SpO2 95.00%. Awake and alert. Oriented and appropriate. Cranial nerve function intact. Motor and sensory function of the extremities normal. Wound clean and dry. Chest and abdomen benign.  Disposition: 01-Home or Self Care   Future Appointments Provider Department Dept Phone   12/12/2012 3:30 PM Leslye Peer, MD [facility] [phone]       Medication List         albuterol 108 (90 BASE) MCG/ACT inhaler  Commonly known as:  PROVENTIL HFA;VENTOLIN HFA  Inhale 2 puffs into the lungs every 6 (six) hours as needed for wheezing.     ALPRAZolam 1 MG tablet  Commonly known as:  XANAX  Take 1-2 mg by mouth 2 (two) times daily as needed for sleep or anxiety (Takes 1 tablet daily if needed for anxiety, and takes 2 tablets every night at bedtime.).     ARIPiprazole 10 MG tablet  Commonly known as:  ABILIFY  Take 10 mg by mouth at bedtime.     BEPREVE 1.5 % Soln  Generic drug:  Bepotastine Besilate  Place 1 drop into both eyes 3 (three) times daily as needed (for dry eyes).     BUDEPRION SR 150 MG 12 hr tablet  Generic drug:  buPROPion  Take 150 mg by mouth 3 (three) times daily.     butalbital-acetaminophen-caffeine 50-325-40 MG  per tablet  Commonly known as:  FIORICET, ESGIC  Take 1 tablet by mouth 2 (two) times daily as needed for headache.     CALCIUM GUMMIES PO  Take 2 tablets by mouth daily.     cyclobenzaprine 10 MG tablet  Commonly known as:  FLEXERIL  Take 20 mg by mouth at bedtime.     cycloSPORINE 0.05 % ophthalmic emulsion  Commonly known as:  RESTASIS  Place 1 drop into both eyes 2 (two) times daily.     DOCUSATE SODIUM PO  Take 1 tablet by mouth once a week. Takes "near the end of each week."     esomeprazole 40 MG capsule  Commonly known as:  NEXIUM  Take 40 mg by mouth daily before breakfast.     ezetimibe 10 MG tablet  Commonly known as:  ZETIA  Take 10 mg by mouth at bedtime.     famotidine 20 MG tablet  Commonly known as:  PEPCID  Take 1 tablet (20 mg total) by mouth 2 (two) times daily.     FISH OIL PO  Take 2 tablets by mouth daily.     fluorometholone 0.1 % ophthalmic suspension  Commonly known as:  FML  Place 1 drop into both eyes 2 (two) times daily.     FLUoxetine 10 MG capsule  Commonly known as:  PROZAC  Take 10 mg by mouth daily.     fluticasone 50 MCG/ACT nasal spray  Commonly known as:  FLONASE  Place  2 sprays into the nose 2 (two) times daily.     furosemide 40 MG tablet  Commonly known as:  LASIX  Take 40 mg by mouth 3 (three) times daily.     HYDROmorphone 2 MG tablet  Commonly known as:  DILAUDID  Take 1 tablet (2 mg total) by mouth every 6 (six) hours as needed for pain.     ibuprofen 200 MG tablet  Commonly known as:  ADVIL,MOTRIN  Take 200 mg by mouth every 6 (six) hours as needed.     lamoTRIgine 100 MG tablet  Commonly known as:  LAMICTAL  Take 100 mg by mouth at bedtime.     Loratadine 10 MG Caps  Take 1 capsule by mouth daily.     meclizine 25 MG tablet  Commonly known as:  ANTIVERT  Take 25 mg by mouth at bedtime.     Melatonin 300 MCG Tabs  Take 1 tablet by mouth at bedtime as needed (for sleep).     meloxicam 7.5 MG tablet   Commonly known as:  MOBIC  Take 2 tablets (15 mg total) by mouth daily.     multivitamin capsule  Take 1 capsule by mouth daily.     PENNSAID 1.5 % Soln  Generic drug:  Diclofenac Sodium  Place 1 drop into both eyes daily.     potassium gluconate 595 MG Tabs tablet  Take 595 mg by mouth 3 (three) times daily.     pramipexole 0.5 MG tablet  Commonly known as:  MIRAPEX  Take 0.5 mg by mouth at bedtime.     pregabalin 100 MG capsule  Commonly known as:  LYRICA  Take 100 mg by mouth at bedtime.     PRILOSEC 20 MG capsule  Generic drug:  omeprazole  Take 20 mg by mouth at bedtime.     promethazine 25 MG tablet  Commonly known as:  PHENERGAN  Take 1 tablet (25 mg total) by mouth every 6 (six) hours as needed for nausea.     RASPBERRY KETONES PO  Take 4 tablets by mouth 2 (two) times daily.     RITALIN LA 40 MG 24 hr capsule  Generic drug:  methylphenidate  Take 80 mg by mouth daily.     SYSTANE 0.4-0.3 % Soln  Generic drug:  Polyethyl Glycol-Propyl Glycol  Place 1 drop into both eyes 3 (three) times daily.     VITAMIN B COMPLEX PO  Take 1 tablet by mouth daily.     VITAMIN C GUMMIE PO  Take 1 tablet by mouth daily.     VITAMIN D3 ADULT GUMMIES 1000 UNITS Chew  Generic drug:  Cholecalciferol  Chew 1,000 Units by mouth daily.     zonisamide 100 MG capsule  Commonly known as:  ZONEGRAN  Take 200 mg by mouth at bedtime.           Follow-up Information   Follow up with Temple Pacini, MD.   Specialty:  Neurosurgery   Contact information:   [address] [place] [address] [phone]       Signed: Keamber Macfadden A 11/07/2012, 11:29 AM

## 2012-11-07 NOTE — Progress Notes (Signed)
Pt. discharged home accompanied by family. Prescriptions and discharge instructions given with verbalization of understanding. Incision site on back with no s/s of infection - no swelling, redness, bleeding, and/or drainage noted. Opportunity given to ask questions but no question asked.  

## 2012-11-15 NOTE — Anesthesia Postprocedure Evaluation (Signed)
  Anesthesia Post-op Note  Patient: [name]  Procedure(s) Performed: Procedure(s): LUMBAR TWO THREE LUMBAR LAMINECTOMY/DECOMPRESSION MICRODISCECTOMY 1 LEVEL (Right)  11/15/12 13:45-14:00 Post-op telephone call with patient     I called patient to discuss her concerns of possibly having recollection during laminectomy.  Patient reassured that we may never know if she truly had intra-operative recall.  Regardless, her BP and HR during the procedure did not reveal that she was under duress.  While she does not recall any specific conversation, she does vividly recall a series of multiple, very painful injections.  She understands that under certain circumstances, such as a drop in BP, anesthetic doses may be lowered, and that the quantity of prescribed medications that she takes daily may alter her tolerance to anesthetic agents.  She is very appreciative of the conversation that she and I had immediately post-op in the PACU, as well of this present conversation.  She said that she has "absolutely zero concern that anyone has been recalcitrant in their duties." She related feeling relieved that she is putting this behind her and thanked me for taking such good care of her.  I gave her the [facility] contact information, so that she can follow up with any questions or concerns.

## 2012-12-12 ENCOUNTER — Ambulatory Visit (INDEPENDENT_AMBULATORY_CARE_PROVIDER_SITE_OTHER): Payer: Medicare Other | Admitting: Emergency Medicine

## 2012-12-12 ENCOUNTER — Encounter: Payer: Self-pay | Admitting: Emergency Medicine

## 2012-12-12 VITALS — BP 120/80 | HR 70 | Ht 63.0 in | Wt 193.8 lb

## 2012-12-12 DIAGNOSIS — J383 Other diseases of vocal cords: Secondary | ICD-10-CM

## 2012-12-12 DIAGNOSIS — J309 Allergic rhinitis, unspecified: Secondary | ICD-10-CM

## 2012-12-12 DIAGNOSIS — J984 Other disorders of lung: Secondary | ICD-10-CM

## 2012-12-12 NOTE — Assessment & Plan Note (Signed)
Stable. No further CT's needed unless she develops sx.

## 2012-12-12 NOTE — Patient Instructions (Signed)
Please continue your current medications as you are taking them Try using decongestants that contain chlorpheniramine OTC as directed  Use Delsym as needed for your cough.  Follow with Dr Delton Coombes in 4 months or sooner if you have any problems.

## 2012-12-12 NOTE — Assessment & Plan Note (Signed)
Seems to be stable. She knows how to relax when she has a spasm.

## 2012-12-12 NOTE — Assessment & Plan Note (Signed)
Continue current regimen

## 2012-12-12 NOTE — Progress Notes (Signed)
67 yo former smoker, hx documented allergies and rhinitis, chronic cough. Has seen Dr Corinda Gubler regarding allergies and the cough. For the last month the cough has been much worse, happens with talking and exertion. She has been empirically treated with by mouth steroids and also started on inhaled LABA/ICS. Skin testing has shown no sensitivity to cats/dogs although she has 30 animals in her home. Spirometry done 07/03/09 showed normal airflows. She has also been treated for GERD with Nexium + prilosec.   ROV 11/04/09 -- returns for cough. Her HSP panel is negative for fungal sensitivity, IgE and eosinophils normal. Her cough persists, may be a bit better. Able to carry on more conversation, talking does make it worse. Dr Maple Hudson rightly raises possibility of auto-immune process, even BAC.   ROV 12/03/09 -- chronic cough, chronic rhinitis, normal AF on spirometry. She was admitted 10/9-10/12 to [facility] with fever, lightheadedness, dyspnea, cough was actually better. Treated for ? COPD exac or PNA. CT scan without infiltrates (per d/c summary). Cleda Daub 5/11 normal. She believes that the [facility] helps her. Her cough is better. 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   ROV 12/25/09 -- chronic cough, rhinitis, abnormal CT scan (? mild nodular infiltrate, 09/2009). Repeat scan done at [facility] in 10/11, need to review and compare. Initial plan has been to repeat scan in Dec. Has had PFT since last visit, here to review. 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 Her exposure profile at home is very high - dusts, litter etc. She is on [place], sometimes coughs after it. She believes that the [facility] is helping her breathing.   ROV 05/22/10 -- returns for eval severe allergies, POSSIBLE AFL, on Dulera + flonase, antivert, loratadine, omeprazole, cough suppressants, nasal saline washes. Her cough has been worse over the last week, 1 week ago she had fever, chills.  Some hoarse voice.  ROV 06/19/12 -- returns for eval severe allergies, POSSIBLE AFL. She returns reporting that her cough has improved. She is using loratadine NSW and fluticasone nasal spray. She restarted her dulera without too much problem. She uses proair prn, about 2x a month. She has had a lot of problem w thrush after taking abx., ? dulera part of the problem. She is overdue to have repeat CT scan chest for RML nodule.   ROV 08/04/12 -- cough and rhinitis w allergies, dyspnea with possible AFL.  Her cough is somewhat better. She still has SOB with exertion, had an episode of classic UA closure in the shower recently. She relaxed and it resolved.   ROV 12/12/12 -- cough and rhinitis w allergies, dyspnea with possible AFL. Since last time she has been able to marry her partner of over 30 years!! She has lost 40 lbs!! She is having some deep cough, remains on her good allergy regimen. Still feels a tickle in her throat.  She still gets episodes of VC spasm.  Still has lots of animal exposure at home.   Filed Vitals:   12/12/12 1701  BP: 120/80  Pulse: 70  Height: 5\' 3"  (1.6 m)  Weight: 193 lb 12.8 oz (87.907 kg)  SpO2: 93%   Gen: Pleasant, well-nourished, in no distress,  normal affect  ENT: No lesions,  mouth clear,  oropharynx clear, no postnasal drip  Neck: No JVD, no TMG, no carotid bruits  Lungs: No use of accessory muscles, no dullness to percussion, clear without rales or rhonchi  Cardiovascular: RRR, heart sounds normal, no murmur or gallops, no peripheral edema  Musculoskeletal: No deformities, no cyanosis or clubbing  Neuro: alert, non focal  Skin: Warm, no lesions or rashes   07/05/12 --  Comparison: 10/06/2009 chest CT  Findings: Cervical fusion hardware partly visualized. Great  vessels are normal in caliber. Trace fluid within the superior  pericardial recess is noted. Heart size is normal. No  lymphadenopathy.  Incomplete imaging of the upper abdomen demonstrates  metallic  hardware from spinal stimulator in the right flank subcutaneous  tissues producing streak artifact. Cholecystectomy clips noted.  Cysts are again noted at the dome of the liver.  Right middle lobe subpleural 6 mm nodule image 29 demonstrate  ground-glass opacity and decreased density since previously. No new  pulmonary mass, 3 mm left lower lobe pulmonary nodule image 43 is  stable. No new pulmonary mass, nodule, or consolidation. Central  airways are patent.  No acute osseous finding.  IMPRESSION:  Stable pulmonary nodules as above since previous exam 2011. This  is compatible with probable benignity and does not warrant further  specific imaging follow-up according to consensus guidelines.  No new acute finding.   CPST 07/18/12 --  Conclusion: Exercise testing with gas exchange demonstrates a normal functional capacity when compared to matched sedentary norms. There is a relative hyperventilation near peak exercise (relatively steep RCP), when VE/VCO2 is corrected to the RCP the slope remains significantly elevated at 37. The elevated VE/VCO2 slope and low HR response are concerning for an underlying circulatory limitation as primary problem. The most common type in obese individuals is diastolic dysfunction. In addition, Air flow limitation and obesity could be contributing.   ALLERGIC RHINITIS Continue current regimen.   Vocal cord dysfunction Seems to be stable. She knows how to relax when she has a spasm.   PULMONARY NODULE, RIGHT MIDDLE LOBE Stable. No further CT's needed unless she develops sx.

## 2012-12-14 ENCOUNTER — Other Ambulatory Visit: Payer: Self-pay

## 2013-01-15 ENCOUNTER — Other Ambulatory Visit (HOSPITAL_BASED_OUTPATIENT_CLINIC_OR_DEPARTMENT_OTHER): Payer: Self-pay | Admitting: Family Medicine

## 2013-01-15 ENCOUNTER — Ambulatory Visit (HOSPITAL_BASED_OUTPATIENT_CLINIC_OR_DEPARTMENT_OTHER)
Admission: RE | Admit: 2013-01-15 | Discharge: 2013-01-15 | Disposition: A | Discharge status: Home / Self Care | Payer: Medicare Other | Source: Ambulatory Visit | Attending: Family Medicine | Admitting: Family Medicine

## 2013-01-15 DIAGNOSIS — M79609 Pain in unspecified limb: Secondary | ICD-10-CM | POA: Insufficient documentation

## 2013-02-08 HISTORY — PX: ORIF TIBIA FRACTURE: SHX5416

## 2013-02-08 HISTORY — PX: ORIF ANKLE FRACTURE: SUR919

## 2013-05-30 ENCOUNTER — Ambulatory Visit (INDEPENDENT_AMBULATORY_CARE_PROVIDER_SITE_OTHER): Payer: Medicare Other | Admitting: Emergency Medicine

## 2013-05-30 ENCOUNTER — Encounter: Payer: Self-pay | Admitting: Emergency Medicine

## 2013-05-30 VITALS — BP 128/80 | HR 67 | Ht 63.0 in | Wt 180.0 lb

## 2013-05-30 DIAGNOSIS — J383 Other diseases of vocal cords: Secondary | ICD-10-CM

## 2013-05-30 NOTE — Assessment & Plan Note (Signed)
Stable. Continue allergy regimen, GERD regimen.

## 2013-05-30 NOTE — Progress Notes (Signed)
68 yo former smoker, hx documented allergies and rhinitis, chronic cough. Has seen Dr Corinda GublerLeBauer regarding allergies and the cough. For the last month the cough has been much worse, happens with talking and exertion. She has been empirically treated with by mouth steroids and also started on inhaled LABA/ICS. Skin testing has shown no sensitivity to cats/dogs although she has 30 animals in her home. Spirometry done 07/03/09 showed normal airflows. She has also been treated for GERD with Nexium + prilosec.   ROV 11/04/09 -- returns for cough. Her HSP panel is negative for fungal sensitivity, IgE and eosinophils normal. Her cough persists, may be a bit better. Able to carry on more conversation, talking does make it worse. Dr Maple HudsonYoung rightly raises possibility of auto-immune process, even BAC.   ROV 12/03/09 -- chronic cough, chronic rhinitis, normal AF on spirometry. She was [facility]10[facility] with fever, lightheadedness, dyspnea, cough was actually better. Treated for ? COPD exac or PNA. CT scan without infiltrates (per d/c summary). Cleda DaubSpiro 5/11 normal. She believes that the [facility]. Her cough is better. 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   ROV 12/25/09 -- chronic cough, rhinitis, abnormal CT scan (? mild nodular infiltrate, 09/2009). [facility], need to review and compare. Initial plan has been to repeat scan in Dec. Has had PFT since last visit, here to review. 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 Her exposure profile at home is very high - dusts, litter etc. She is on [place], sometimes coughs after it. [facility].   ROV 05/22/10 -- returns for eval severe allergies, POSSIBLE AFL, on Dulera + flonase, antivert, loratadine, omeprazole, cough suppressants, nasal saline washes. Her cough has been worse over the last week, 1 week ago she had fever, chills.  Some hoarse voice.  ROV 06/19/12 -- returns for eval severe allergies, POSSIBLE AFL. She returns reporting that her cough has improved. She is using loratadine [place] and fluticasone nasal spray. She restarted her dulera without too much problem. She uses proair prn, about 2x a month. She has had a lot of problem w thrush after taking abx., ? dulera part of the problem. She is overdue to have repeat CT scan chest for RML nodule.   ROV 08/04/12 -- cough and rhinitis w allergies, dyspnea with possible AFL.  Her cough is somewhat better. She still has SOB with exertion, had an episode of classic UA closure in the shower recently. She relaxed and it resolved.   ROV 12/12/12 -- cough and rhinitis w allergies, dyspnea with possible AFL. Since last time she has been able to marry her partner of over 30 years!! She has lost 40 lbs!! She is having some deep cough, remains on her good allergy regimen. Still feels a tickle in her throat.  She still gets episodes of VC spasm.  Still has lots of animal exposure at home.   ROV 05/30/13 -- cough and rhinitis w allergies, dyspnea with possible AFL. She continues to have coughing spells, happen 2-3x a week. Still with exertional dyspnea. On fluticasone, nexium, pepcid, nasal saline.  Albuterol prn > uses few times a week. She is having R hand surgery next week.   Filed Vitals:   05/30/13 1501  BP: 128/80  Pulse: 67  Height: 5\' 3"  (1.6 m)  Weight: 180 lb (81.647 kg)  SpO2: 99%  Gen: Pleasant, well-nourished, in no distress,  normal affect  ENT: No lesions,  mouth clear,  oropharynx clear, no postnasal drip  Neck: No JVD, no TMG, no carotid bruits  Lungs: No use of accessory muscles, no dullness to percussion, clear without rales or rhonchi  Cardiovascular: RRR, heart sounds normal, no murmur or gallops, no peripheral edema  Musculoskeletal: No deformities, no cyanosis or clubbing  Neuro: alert, non focal  Skin: Warm, no lesions or rashes   07/05/12 --   Comparison: 10/06/2009 chest CT  Findings: Cervical fusion hardware partly visualized. Great  vessels are normal in caliber. Trace fluid within the superior  pericardial recess is noted. Heart size is normal. No  lymphadenopathy.  Incomplete imaging of the upper abdomen demonstrates metallic  hardware from spinal stimulator in the right flank subcutaneous  tissues producing streak artifact. Cholecystectomy clips noted.  Cysts are again noted at the dome of the liver.  Right middle lobe subpleural 6 mm nodule image 29 demonstrate  ground-glass opacity and decreased density since previously. No new  pulmonary mass, 3 mm left lower lobe pulmonary nodule image 43 is  stable. No new pulmonary mass, nodule, or consolidation. Central  airways are patent.  No acute osseous finding.  IMPRESSION:  Stable pulmonary nodules as above since previous exam 2011. This  is compatible with probable benignity and does not warrant further  specific imaging follow-up according to consensus guidelines.  No new acute finding.   CPST 07/18/12 --  Conclusion: Exercise testing with gas exchange demonstrates a normal functional capacity when compared to matched sedentary norms. There is a relative hyperventilation near peak exercise (relatively steep RCP), when VE/VCO2 is corrected to the RCP the slope remains significantly elevated at 37. The elevated VE/VCO2 slope and low HR response are concerning for an underlying circulatory limitation as primary problem. The most common type in obese individuals is diastolic dysfunction. In addition, Air flow limitation and obesity could be contributing.   Vocal cord dysfunction Stable. Continue allergy regimen, GERD regimen.

## 2013-05-30 NOTE — Patient Instructions (Signed)
Please continue your medications as you have been taking them  Follow with Dr Kimbria Camposano in 6 months or sooner if you have any problems  

## 2013-09-08 HISTORY — PX: FRACTURE SURGERY: SHX138

## 2013-09-12 DIAGNOSIS — S82899A Other fracture of unspecified lower leg, initial encounter for closed fracture: Secondary | ICD-10-CM | POA: Insufficient documentation

## 2013-12-05 ENCOUNTER — Ambulatory Visit (INDEPENDENT_AMBULATORY_CARE_PROVIDER_SITE_OTHER): Payer: Medicare Other | Admitting: Emergency Medicine

## 2013-12-05 ENCOUNTER — Other Ambulatory Visit: Payer: Self-pay | Admitting: Neurosurgery

## 2013-12-05 ENCOUNTER — Encounter: Payer: Self-pay | Admitting: Emergency Medicine

## 2013-12-05 VITALS — BP 122/74 | HR 63 | Temp 97.3°F | Ht 63.0 in | Wt 172.4 lb

## 2013-12-05 DIAGNOSIS — J383 Other diseases of vocal cords: Secondary | ICD-10-CM

## 2013-12-05 DIAGNOSIS — M5126 Other intervertebral disc displacement, lumbar region: Secondary | ICD-10-CM

## 2013-12-05 DIAGNOSIS — J309 Allergic rhinitis, unspecified: Secondary | ICD-10-CM

## 2013-12-05 MED ORDER — FLUTICASONE PROPIONATE 50 MCG/ACT NA SUSP: 2.0000 | Freq: Two times a day (BID) | NASAL | Status: AC

## 2013-12-05 NOTE — Patient Instructions (Signed)
Please continue your loratadine. Increase your nasal saline washes to once every day Restart fluticasone nasal spray, 2 sprays each nostril daily Continue your other medications as you have been taking them Follow with Dr Delton CoombesByrum in 6 months or sooner if you have any problems

## 2013-12-05 NOTE — Assessment & Plan Note (Addendum)
-   need to restart fluticasone - continue NSW's - continue loratadine

## 2013-12-05 NOTE — Progress Notes (Signed)
68 yo former smoker, hx documented allergies and rhinitis, chronic cough. Has seen Dr Corinda GublerLeBauer regarding allergies and the cough. For the last month the cough has been much worse, happens with talking and exertion. She has been empirically treated with by mouth steroids and also started on inhaled LABA/ICS. Skin testing has shown no sensitivity to cats/dogs although she has 30 animals in her home. Spirometry done 07/03/09 showed normal airflows. She has also been treated for GERD with Nexium + prilosec.   ROV 11/04/09 -- returns for cough. Her HSP panel is negative for fungal sensitivity, IgE and eosinophils normal. Her cough persists, may be a bit better. Able to carry on more conversation, talking does make it worse. Dr Maple HudsonYoung rightly raises possibility of auto-immune process, even BAC.   ROV 12/03/09 -- chronic cough, chronic rhinitis, normal AF on spirometry. [facility], lightheadedness, dyspnea, cough was actually better. Treated for ? COPD exac or PNA. CT scan without infiltrates (per d/c summary). Cleda DaubSpiro 5/11 normal. She [facility]. Her cough is better. 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   ROV 12/25/09 -- chronic cough, rhinitis, abnormal CT scan (? mild nodular infiltrate, 09/2009). [facility], need to review and compare. Initial plan has been to repeat scan in Dec. Has had PFT since last visit, here to review. 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 Her exposure profile at home is very high - dusts, litter etc. She is on [place]ulera, sometimes coughs after it. She believes that the [facility].   ROV 05/22/10 -- returns for eval severe allergies, POSSIBLE AFL, on Dulera + flonase, antivert, loratadine, omeprazole, cough suppressants, nasal saline washes. Her cough has been worse over the last week, 1 week ago she had fever, chills.  Some hoarse voice.  ROV 06/19/12 -- returns for eval severe allergies, POSSIBLE AFL. She returns reporting that her cough has improved. She is using loratadine [place] and fluticasone nasal spray. She restarted her dulera without too much problem. She uses proair prn, about 2x a month. She has had a lot of problem w thrush after taking abx., ? dulera part of the problem. She is overdue to have repeat CT scan chest for RML nodule.   ROV 08/04/12 -- cough and rhinitis w allergies, dyspnea with possible AFL.  Her cough is somewhat better. She still has SOB with exertion, had an episode of classic UA closure in the shower recently. She relaxed and it resolved.   ROV 12/12/12 -- cough and rhinitis w allergies, dyspnea with possible AFL. Since last time she has been able to marry her partner of over 30 years!! She has lost 40 lbs!! She is having some deep cough, remains on her good allergy regimen. Still feels a tickle in her throat.  She still gets episodes of VC spasm.  Still has lots of animal exposure at home.   ROV 05/30/13 -- cough and rhinitis w allergies, dyspnea with possible AFL. She continues to have coughing spells, happen 2-3x a week. Still with exertional dyspnea. On fluticasone, nexium, pepcid, nasal saline.  Albuterol prn > uses few times a week. She is having R hand surgery next week.   ROV 12/05/13 -- cough and rhinitis w allergies, dyspnea with possible AFL. She reports that she suffered a fall, R leg fx, R ankle wound. She is  having some hoarseness and cough since she injured the foot. She has been on long term abx for her R ankle wound. She is doing [place] but not every day, she stopped fluticasone nasal spray due to nasal irritation. She remains loratadine.   Filed Vitals:   12/05/13 1505  BP: 122/74  Pulse: 63  Temp: 97.3 F (36.3 C)  TempSrc: Oral  Height: 5\' 3"  (1.6 m)  Weight: 172 lb 6.4 oz (78.2 kg)  SpO2: 98%   Gen: Pleasant, well-nourished, in no distress,  normal affect  ENT: No  lesions,  mouth clear,  oropharynx clear, no postnasal drip  Neck: No JVD, no TMG, no carotid bruits  Lungs: No use of accessory muscles, no dullness to percussion, clear without rales or rhonchi  Cardiovascular: RRR, heart sounds normal, no murmur or gallops, no peripheral edema  Musculoskeletal: No deformities, no cyanosis or clubbing  Neuro: alert, non focal  Skin: Warm, no lesions or rashes   07/05/12 --  Comparison: 10/06/2009 chest CT  Findings: Cervical fusion hardware partly visualized. Great  vessels are normal in caliber. Trace fluid within the superior  pericardial recess is noted. Heart size is normal. No  lymphadenopathy.  Incomplete imaging of the upper abdomen demonstrates metallic  hardware from spinal stimulator in the right flank subcutaneous  tissues producing streak artifact. Cholecystectomy clips noted.  Cysts are again noted at the dome of the liver.  Right middle lobe subpleural 6 mm nodule image 29 demonstrate  ground-glass opacity and decreased density since previously. No new  pulmonary mass, 3 mm left lower lobe pulmonary nodule image 43 is  stable. No new pulmonary mass, nodule, or consolidation. Central  airways are patent.  No acute osseous finding.  IMPRESSION:  Stable pulmonary nodules as above since previous exam 2011. This  is compatible with probable benignity and does not warrant further  specific imaging follow-up according to consensus guidelines.  No new acute finding.   CPST 07/18/12 --  Conclusion: Exercise testing with gas exchange demonstrates a normal functional capacity when compared to matched sedentary norms. There is a relative hyperventilation near peak exercise (relatively steep RCP), when VE/VCO2 is corrected to the RCP the slope remains significantly elevated at 37. The elevated VE/VCO2 slope and low HR response are concerning for an underlying circulatory limitation as primary problem. The most common type in obese  individuals is diastolic dysfunction. In addition, Air flow limitation and obesity could be contributing.   Allergic rhinitis - need to restart fluticasone - continue [place]'s - continue loratadine   Vocal cord dysfunction appears to be fairly stable although allergies are becoming a problem.

## 2013-12-05 NOTE — Assessment & Plan Note (Signed)
appears to be fairly stable although allergies are becoming a problem.

## 2013-12-10 ENCOUNTER — Ambulatory Visit
Admission: RE | Admit: 2013-12-10 | Discharge: 2013-12-10 | Disposition: A | Discharge status: Home / Self Care | Payer: Medicare Other | Source: Ambulatory Visit | Attending: Neurosurgery | Admitting: Neurosurgery

## 2013-12-10 DIAGNOSIS — M5126 Other intervertebral disc displacement, lumbar region: Secondary | ICD-10-CM

## 2013-12-10 MED ORDER — DIAZEPAM 5 MG PO TABS
5.0000 mg | ORAL_TABLET | Freq: Once | ORAL | Status: AC
Start: 2013-12-10 — End: 2013-12-10
  Administered 2013-12-10: 5 mg via ORAL

## 2013-12-10 MED ORDER — IOHEXOL 180 MG/ML  SOLN
15.0000 mL | Freq: Once | INTRAMUSCULAR | Status: AC | PRN
  Administered 2013-12-10: 15 mL via INTRATHECAL

## 2013-12-10 NOTE — Progress Notes (Signed)
Pt states she has been off abilify, bupropion, prozac, and ritalin for the past 2 days.  Discharge instructions explained to pt.

## 2013-12-10 NOTE — Discharge Instructions (Signed)
Myelogram Discharge Instructions  1. Go home and rest quietly for the next 24 hours.  It is important to lie flat for the next 24 hours.  Get up only to go to the restroom.  You may lie in the bed or on a couch on your back, your stomach, your left side or your right side.  You may have one pillow under your head.  You may have pillows between your knees while you are on your side or under your knees while you are on your back.  2. DO NOT drive today.  Recline the seat as far back as it will go, while still wearing your seat belt, on the way home.  3. You may get up to go to the bathroom as needed.  You may sit up for 10 minutes to eat.  You may resume your normal diet and medications unless otherwise indicated.  Drink lots of extra fluids today and tomorrow.  4. The incidence of headache, nausea, or vomiting is about 5% (one in 20 patients).  If you develop a headache, lie flat and drink plenty of fluids until the headache goes away.  Caffeinated beverages may be helpful.  If you develop severe nausea and vomiting or a headache that does not go away with flat bed rest, call [phone].  5. You may resume normal activities after your 24 hours of bed rest is over; however, do not exert yourself strongly or do any heavy lifting tomorrow. If when you get up you have a headache when standing, go back to bed and force fluids for another 24 hours.  6. Call your physician for a follow-up appointment.  The results of your myelogram will be sent directly to your physician by the following day.  7. If you have any questions or if complications develop after you arrive home, please call [phone].  Discharge instructions have been explained to the patient.  The patient, or the person responsible for the patient, fully understands these instructions.      May resume Prozac, Abilify, Bupropion and Ritalin on Nov. 3, 2015, after 1:00 pm.

## 2014-01-02 ENCOUNTER — Other Ambulatory Visit: Payer: Self-pay | Admitting: Neurosurgery

## 2014-02-06 ENCOUNTER — Encounter (HOSPITAL_COMMUNITY)
Admission: RE | Admit: 2014-02-06 | Discharge: 2014-02-06 | Disposition: A | Discharge status: Home / Self Care | Payer: Medicare Other | Source: Ambulatory Visit | Attending: Neurosurgery | Admitting: Neurosurgery

## 2014-02-06 ENCOUNTER — Encounter (HOSPITAL_COMMUNITY): Payer: Self-pay

## 2014-02-06 DIAGNOSIS — Z01812 Encounter for preprocedural laboratory examination: Secondary | ICD-10-CM | POA: Diagnosis not present

## 2014-02-06 HISTORY — DX: Anxiety disorder, unspecified: F41.9

## 2014-02-06 HISTORY — DX: Acute embolism and thrombosis of unspecified deep veins of unspecified lower extremity: I82.409

## 2014-02-06 HISTORY — DX: Other diseases of vocal cords: J38.3

## 2014-02-06 HISTORY — DX: Major depressive disorder, single episode, unspecified: F32.9

## 2014-02-06 HISTORY — DX: Depression, unspecified: F32.A

## 2014-02-06 LAB — CBC WITH DIFFERENTIAL/PLATELET
BASOS ABS: 0 10*3/uL (ref 0.0–0.1)
BASOS PCT: 0 % (ref 0–1)
EOS ABS: 0.3 10*3/uL (ref 0.0–0.7)
EOS PCT: 5 % (ref 0–5)
HCT: 37.5 % (ref 36.0–46.0)
HEMOGLOBIN: 12.2 g/dL (ref 12.0–15.0)
Lymphocytes Relative: 25 % (ref 12–46)
Lymphs Abs: 1.6 10*3/uL (ref 0.7–4.0)
MCH: 28.2 pg (ref 26.0–34.0)
MCHC: 32.5 g/dL (ref 30.0–36.0)
MCV: 86.6 fL (ref 78.0–100.0)
Monocytes Absolute: 0.4 10*3/uL (ref 0.1–1.0)
Monocytes Relative: 6 % (ref 3–12)
Neutro Abs: 4.2 10*3/uL (ref 1.7–7.7)
Neutrophils Relative %: 64 % (ref 43–77)
Platelets: 233 10*3/uL (ref 150–400)
RBC: 4.33 MIL/uL (ref 3.87–5.11)
RDW: 14.4 % (ref 11.5–15.5)
WBC: 6.5 10*3/uL (ref 4.0–10.5)

## 2014-02-06 LAB — COMPREHENSIVE METABOLIC PANEL
ALT: 17 U/L (ref 0–35)
AST: 21 U/L (ref 0–37)
Albumin: 3.7 g/dL (ref 3.5–5.2)
Alkaline Phosphatase: 121 U/L — ABNORMAL HIGH (ref 39–117)
Anion gap: 9 (ref 5–15)
BUN: 24 mg/dL — ABNORMAL HIGH (ref 6–23)
CALCIUM: 9.1 mg/dL (ref 8.4–10.5)
CO2: 26 mmol/L (ref 19–32)
CREATININE: 0.94 mg/dL (ref 0.50–1.10)
Chloride: 103 mEq/L (ref 96–112)
GFR calc Af Amer: 72 mL/min — ABNORMAL LOW (ref >90)
GFR calc non Af Amer: 62 mL/min — ABNORMAL LOW (ref >90)
Glucose, Bld: 97 mg/dL (ref 70–99)
Potassium: 3.5 mmol/L (ref 3.5–5.1)
Sodium: 138 mmol/L (ref 135–145)
Total Bilirubin: 0.3 mg/dL (ref 0.3–1.2)
Total Protein: 6.2 g/dL (ref 6.0–8.3)

## 2014-02-06 LAB — TYPE AND SCREEN
ABO/RH(D): O POS
ANTIBODY SCREEN: NEGATIVE

## 2014-02-06 LAB — SURGICAL PCR SCREEN
MRSA, PCR: NEGATIVE
STAPHYLOCOCCUS AUREUS: NEGATIVE

## 2014-02-06 NOTE — Progress Notes (Signed)
Primary - dr. Izola PriceMyers No cardiologist Stress many years ago - states was normal

## 2014-02-06 NOTE — Pre-Procedure Instructions (Signed)
[name]  02/06/2014   Your procedure is scheduled on:  Friday, January 8th  [facility] at 530 AM.  Call this number if you have problems the morning of surgery: [phone]   Remember:   Do not eat food or drink liquids after midnight.   Take these medicines the morning of surgery with A SIP OF WATER: wellbutrin, nexium, pepcid, prozac, flonase, eye drops, loratadine, diluadid if needed, xanax if needed, inhaler if needed  Stop taking aspirin, OTC vitamins/herbal medications, NSAIDS (ibuprofen, advil, motrin) 7 days prior to surgery.   Do not wear jewelry, make-up or nail polish.  Do not wear lotions, powders, or perfume, deodorant.  Do not shave 48 hours prior to surgery. Men may shave face and neck.  Do not bring valuables to the hospital.  [facility] is not responsible for any belongings or valuables.               Contacts, dentures or bridgework may not be worn into surgery.  Leave suitcase in the car. After surgery it may be brought to your room.  For patients admitted to the hospital, discharge time is determined by your  treatment team.        Please read over the following fact sheets that you were given: Pain Booklet, Coughing and Deep Breathing, Blood Transfusion Information, MRSA Information and Surgical Site Infection Prevention  [place] - Preparing for Surgery  Before surgery, you can play an important role.  Because skin is not sterile, your skin needs to be as free of germs as possible.  You can reduce the number of germs on you skin by washing with CHG (chlorahexidine gluconate) soap before surgery.  CHG is an antiseptic cleaner which kills germs and bonds with the skin to continue killing germs even after washing.  Please DO NOT use if you have an allergy to CHG or antibacterial soaps.  If your skin becomes reddened/irritated stop using the CHG and inform your nurse when you arrive at Short Stay.  Do not shave (including  legs and underarms) for at least 48 hours prior to the first CHG shower.  You may shave your face.  Please follow these instructions carefully:   1.  Shower with CHG Soap the night before surgery and the morning of Surgery.  2.  If you choose to wash your hair, wash your hair first as usual with your normal shampoo.  3.  After you shampoo, rinse your hair and body thoroughly to remove the shampoo.  4.  Use CHG as you would any other liquid soap.  You can apply CHG directly to the skin and wash gently with scrungie or a clean washcloth.  5.  Apply the CHG Soap to your body ONLY FROM THE NECK DOWN.  Do not use on open wounds or open sores.  Avoid contact with your eyes, ears, mouth and genitals (private parts).  Wash genitals (private parts) with your normal soap.  6.  Wash thoroughly, paying special attention to the area where your surgery will be performed.  7.  Thoroughly rinse your body with warm water from the neck down.  8.  DO NOT shower/wash with your normal soap after using and rinsing off the CHG Soap.  9.  Pat yourself dry with a clean towel.            10.  Wear clean pajamas.            11.  Place clean sheets on your bed the night of your first shower and do not sleep with pets.  Day of Surgery  Do not apply any lotions/deoderants the morning of surgery.  Please wear clean clothes to the hospital/surgery center.

## 2014-02-14 MED ORDER — CEFAZOLIN SODIUM-DEXTROSE 2-3 GM-% IV SOLR
2.0000 g | INTRAVENOUS | Status: AC
  Administered 2014-02-15: 2 g via INTRAVENOUS
  Filled 2014-02-14: qty 50

## 2014-02-14 MED ORDER — DEXAMETHASONE SODIUM PHOSPHATE 10 MG/ML IJ SOLN
10.0000 mg | INTRAMUSCULAR | Status: AC
Start: 2014-02-15 — End: 2014-02-15
  Administered 2014-02-15: 10 mg via INTRAVENOUS
  Filled 2014-02-14: qty 1

## 2014-02-14 NOTE — Anesthesia Preprocedure Evaluation (Addendum)
Anesthesia Evaluation   Patient awake    Reviewed: Allergy & Precautions, NPO status , reviewed documented beta blocker date and time   History of Anesthesia Complications (+) PONVHistory of anesthetic complications: documented case of awareness with last spine surgery.  will use BIS.  Airway Mallampati: II       Dental  (+) Edentulous Upper, Edentulous Lower   Pulmonary former smoker,  breath sounds clear to auscultation        Cardiovascular hypertension, Pt. on medications Rhythm:Regular  EKG 2014 Incomplete RBB, poss MI   Neuro/Psych Anxiety Depression Bipolar Disorder    GI/Hepatic Neg liver ROS, GERD-  Medicated,  Endo/Other  negative endocrine ROS  Renal/GU      Musculoskeletal  (+) Arthritis -, Fibromyalgia -  Abdominal (+)  Abdomen: soft.    Peds  Hematology H/H 12/37   Anesthesia Other Findings   Reproductive/Obstetrics                            Anesthesia Physical Anesthesia Plan  ASA: III  Anesthesia Plan: General   Post-op Pain Management:    Induction: Intravenous  Airway Management Planned: Oral ETT  Additional Equipment:   Intra-op Plan:   Post-operative Plan: Extubation in OR  Informed Consent: I have reviewed the patients History and Physical, chart, labs and discussed the procedure including the risks, benefits and alternatives for the proposed anesthesia with the patient or authorized representative who has indicated his/her understanding and acceptance.     Plan Discussed with:   Anesthesia Plan Comments:         Anesthesia Quick Evaluation

## 2014-02-15 ENCOUNTER — Inpatient Hospital Stay (HOSPITAL_COMMUNITY): Payer: Medicare Other | Admitting: Anesthesiology

## 2014-02-15 ENCOUNTER — Encounter (HOSPITAL_COMMUNITY)
Admission: RE | Disposition: A | Discharge status: Home / Self Care | Payer: Self-pay | Source: Ambulatory Visit | Attending: Neurosurgery

## 2014-02-15 ENCOUNTER — Inpatient Hospital Stay (HOSPITAL_COMMUNITY)
Admission: RE | Admit: 2014-02-15 | Discharge: 2014-02-17 | DRG: 459 | Disposition: A | Discharge status: Home / Self Care | Payer: Medicare Other | Source: Ambulatory Visit | Attending: Neurosurgery | Admitting: Neurosurgery

## 2014-02-15 ENCOUNTER — Inpatient Hospital Stay (HOSPITAL_COMMUNITY): Payer: Medicare Other

## 2014-02-15 ENCOUNTER — Encounter (HOSPITAL_COMMUNITY): Payer: Self-pay | Admitting: *Deleted

## 2014-02-15 DIAGNOSIS — G43909 Migraine, unspecified, not intractable, without status migrainosus: Secondary | ICD-10-CM | POA: Diagnosis present

## 2014-02-15 DIAGNOSIS — G9519 Other vascular myelopathies: Secondary | ICD-10-CM | POA: Diagnosis present

## 2014-02-15 DIAGNOSIS — F319 Bipolar disorder, unspecified: Secondary | ICD-10-CM | POA: Diagnosis present

## 2014-02-15 DIAGNOSIS — M5116 Intervertebral disc disorders with radiculopathy, lumbar region: Secondary | ICD-10-CM | POA: Diagnosis present

## 2014-02-15 DIAGNOSIS — M4806 Spinal stenosis, lumbar region: Secondary | ICD-10-CM | POA: Diagnosis present

## 2014-02-15 DIAGNOSIS — Z86718 Personal history of other venous thrombosis and embolism: Secondary | ICD-10-CM

## 2014-02-15 DIAGNOSIS — M4186 Other forms of scoliosis, lumbar region: Secondary | ICD-10-CM | POA: Diagnosis present

## 2014-02-15 DIAGNOSIS — M797 Fibromyalgia: Secondary | ICD-10-CM | POA: Diagnosis present

## 2014-02-15 DIAGNOSIS — K219 Gastro-esophageal reflux disease without esophagitis: Secondary | ICD-10-CM | POA: Diagnosis present

## 2014-02-15 DIAGNOSIS — M48062 Spinal stenosis, lumbar region with neurogenic claudication: Secondary | ICD-10-CM | POA: Diagnosis present

## 2014-02-15 DIAGNOSIS — Z981 Arthrodesis status: Secondary | ICD-10-CM

## 2014-02-15 DIAGNOSIS — Z419 Encounter for procedure for purposes other than remedying health state, unspecified: Secondary | ICD-10-CM

## 2014-02-15 DIAGNOSIS — Z87891 Personal history of nicotine dependence: Secondary | ICD-10-CM | POA: Diagnosis not present

## 2014-02-15 DIAGNOSIS — M4120 Other idiopathic scoliosis, site unspecified: Secondary | ICD-10-CM | POA: Diagnosis present

## 2014-02-15 HISTORY — PX: ANTERIOR LAT LUMBAR FUSION: SHX1168

## 2014-02-15 SURGERY — ANTERIOR LATERAL LUMBAR FUSION 2 LEVELS
Anesthesia: General | Site: Spine Lumbar | Laterality: Left

## 2014-02-15 MED ORDER — PRAMIPEXOLE DIHYDROCHLORIDE 0.125 MG PO TABS
0.5000 mg | ORAL_TABLET | Freq: Every day | ORAL | Status: DC
  Administered 2014-02-15 – 2014-02-16 (×2): 0.5 mg via ORAL
  Filled 2014-02-15 (×2): qty 4

## 2014-02-15 MED ORDER — HYDROMORPHONE HCL 1 MG/ML IJ SOLN
0.5000 mg | INTRAMUSCULAR | Status: DC | PRN
  Administered 2014-02-15 – 2014-02-17 (×15): 1 mg via INTRAVENOUS
  Filled 2014-02-15 (×16): qty 1

## 2014-02-15 MED ORDER — FENTANYL CITRATE 0.05 MG/ML IJ SOLN
INTRAMUSCULAR | Status: AC
  Filled 2014-02-15: qty 5

## 2014-02-15 MED ORDER — BISACODYL 10 MG RE SUPP: 10.0000 mg | Freq: Every day | RECTAL | Status: DC | PRN

## 2014-02-15 MED ORDER — DIAZEPAM 5 MG PO TABS
5.0000 mg | ORAL_TABLET | Freq: Four times a day (QID) | ORAL | Status: DC | PRN
  Administered 2014-02-15: 10 mg via ORAL
  Filled 2014-02-15: qty 2

## 2014-02-15 MED ORDER — PHENYLEPHRINE 40 MCG/ML (10ML) SYRINGE FOR IV PUSH (FOR BLOOD PRESSURE SUPPORT)
PREFILLED_SYRINGE | INTRAVENOUS | Status: AC
  Filled 2014-02-15: qty 10

## 2014-02-15 MED ORDER — CYCLOSPORINE 0.05 % OP EMUL
1.0000 [drp] | Freq: Two times a day (BID) | OPHTHALMIC | Status: DC
  Administered 2014-02-15 – 2014-02-17 (×4): 1 [drp] via OPHTHALMIC
  Filled 2014-02-15 (×6): qty 1

## 2014-02-15 MED ORDER — EPHEDRINE SULFATE 50 MG/ML IJ SOLN
INTRAMUSCULAR | Status: AC
  Filled 2014-02-15: qty 1

## 2014-02-15 MED ORDER — PREGABALIN 75 MG PO CAPS
300.0000 mg | ORAL_CAPSULE | Freq: Every day | ORAL | Status: DC
  Administered 2014-02-15 – 2014-02-16 (×2): 300 mg via ORAL
  Filled 2014-02-15 (×2): qty 4

## 2014-02-15 MED ORDER — CYCLOBENZAPRINE HCL 10 MG PO TABS
20.0000 mg | ORAL_TABLET | Freq: Every day | ORAL | Status: DC
  Administered 2014-02-15 – 2014-02-16 (×2): 20 mg via ORAL
  Filled 2014-02-15 (×2): qty 2

## 2014-02-15 MED ORDER — FENTANYL CITRATE 0.05 MG/ML IJ SOLN: 25.0000 ug | INTRAMUSCULAR | Status: DC | PRN

## 2014-02-15 MED ORDER — ARTIFICIAL TEARS OP OINT
TOPICAL_OINTMENT | OPHTHALMIC | Status: AC
  Filled 2014-02-15: qty 3.5

## 2014-02-15 MED ORDER — ZONISAMIDE 100 MG PO CAPS
200.0000 mg | ORAL_CAPSULE | Freq: Every day | ORAL | Status: DC
  Administered 2014-02-15 – 2014-02-16 (×2): 200 mg via ORAL
  Filled 2014-02-15 (×3): qty 2

## 2014-02-15 MED ORDER — CEFAZOLIN SODIUM 1-5 GM-% IV SOLN
1.0000 g | Freq: Three times a day (TID) | INTRAVENOUS | Status: AC
  Administered 2014-02-15 – 2014-02-16 (×2): 1 g via INTRAVENOUS
  Filled 2014-02-15 (×2): qty 50

## 2014-02-15 MED ORDER — PHENOL 1.4 % MT LIQD: 1.0000 | OROMUCOSAL | Status: DC | PRN

## 2014-02-15 MED ORDER — MIDAZOLAM HCL 5 MG/5ML IJ SOLN
INTRAMUSCULAR | Status: DC | PRN
  Administered 2014-02-15: 2 mg via INTRAVENOUS

## 2014-02-15 MED ORDER — GLYCOPYRROLATE 0.2 MG/ML IJ SOLN
INTRAMUSCULAR | Status: DC | PRN
  Administered 2014-02-15 (×2): 0.2 mg via INTRAVENOUS

## 2014-02-15 MED ORDER — BUTALBITAL-APAP-CAFFEINE 50-325-40 MG PO TABS: 1.0000 | ORAL_TABLET | Freq: Two times a day (BID) | ORAL | Status: DC | PRN

## 2014-02-15 MED ORDER — FLUOXETINE HCL 20 MG PO CAPS
40.0000 mg | ORAL_CAPSULE | Freq: Every day | ORAL | Status: DC
  Administered 2014-02-15 – 2014-02-17 (×3): 40 mg via ORAL
  Filled 2014-02-15 (×5): qty 2

## 2014-02-15 MED ORDER — NIACIN 500 MG PO TABS
500.0000 mg | ORAL_TABLET | Freq: Every day | ORAL | Status: DC
  Administered 2014-02-15 – 2014-02-16 (×2): 500 mg via ORAL
  Filled 2014-02-15 (×5): qty 1

## 2014-02-15 MED ORDER — FUROSEMIDE 40 MG PO TABS
40.0000 mg | ORAL_TABLET | Freq: Two times a day (BID) | ORAL | Status: DC
  Administered 2014-02-15 – 2014-02-17 (×4): 40 mg via ORAL
  Filled 2014-02-15 (×4): qty 1

## 2014-02-15 MED ORDER — LIDOCAINE HCL (CARDIAC) 20 MG/ML IV SOLN
INTRAVENOUS | Status: DC | PRN
  Administered 2014-02-15: 40 mg via INTRAVENOUS

## 2014-02-15 MED ORDER — POTASSIUM GLUCONATE 595 (99 K) MG PO TABS: 595.0000 mg | ORAL_TABLET | Freq: Two times a day (BID) | ORAL | Status: DC

## 2014-02-15 MED ORDER — MIDAZOLAM HCL 2 MG/2ML IJ SOLN
INTRAMUSCULAR | Status: AC
  Filled 2014-02-15: qty 2

## 2014-02-15 MED ORDER — ONDANSETRON HCL 4 MG/2ML IJ SOLN
INTRAMUSCULAR | Status: AC
  Filled 2014-02-15: qty 2

## 2014-02-15 MED ORDER — SODIUM CHLORIDE 0.9 % IR SOLN
Status: DC | PRN
  Administered 2014-02-15: 500 mL

## 2014-02-15 MED ORDER — PROPOFOL 10 MG/ML IV BOLUS
INTRAVENOUS | Status: AC
  Filled 2014-02-15: qty 20

## 2014-02-15 MED ORDER — ALBUTEROL SULFATE (2.5 MG/3ML) 0.083% IN NEBU: 2.0000 mL | INHALATION_SOLUTION | Freq: Four times a day (QID) | RESPIRATORY_TRACT | Status: DC | PRN

## 2014-02-15 MED ORDER — SUCCINYLCHOLINE CHLORIDE 20 MG/ML IJ SOLN
INTRAMUSCULAR | Status: AC
  Filled 2014-02-15: qty 1

## 2014-02-15 MED ORDER — PANTOPRAZOLE SODIUM 40 MG PO TBEC
40.0000 mg | DELAYED_RELEASE_TABLET | Freq: Every day | ORAL | Status: DC
  Administered 2014-02-16 – 2014-02-17 (×2): 40 mg via ORAL
  Filled 2014-02-15 (×2): qty 1

## 2014-02-15 MED ORDER — ONDANSETRON HCL 4 MG/2ML IJ SOLN
INTRAMUSCULAR | Status: DC | PRN
  Administered 2014-02-15: 4 mg via INTRAVENOUS

## 2014-02-15 MED ORDER — SUCCINYLCHOLINE CHLORIDE 20 MG/ML IJ SOLN
INTRAMUSCULAR | Status: DC | PRN
  Administered 2014-02-15: 100 mg via INTRAVENOUS

## 2014-02-15 MED ORDER — SCOPOLAMINE 1 MG/3DAYS TD PT72
1.0000 | MEDICATED_PATCH | Freq: Once | TRANSDERMAL | Status: DC
  Filled 2014-02-15: qty 1

## 2014-02-15 MED ORDER — SODIUM CHLORIDE 0.9 % IJ SOLN
INTRAMUSCULAR | Status: AC
  Filled 2014-02-15: qty 10

## 2014-02-15 MED ORDER — PROPOFOL 10 MG/ML IV BOLUS
INTRAVENOUS | Status: DC | PRN
  Administered 2014-02-15: 100 mg via INTRAVENOUS

## 2014-02-15 MED ORDER — METHYLPHENIDATE HCL ER (LA) 10 MG PO CP24
80.0000 mg | ORAL_CAPSULE | Freq: Every day | ORAL | Status: DC
  Administered 2014-02-16 – 2014-02-17 (×2): 80 mg via ORAL
  Filled 2014-02-15 (×3): qty 8

## 2014-02-15 MED ORDER — ARTIFICIAL TEARS OP OINT
TOPICAL_OINTMENT | OPHTHALMIC | Status: DC | PRN
Start: 2014-02-15 — End: 2014-02-15
  Administered 2014-02-15: 1 via OPHTHALMIC

## 2014-02-15 MED ORDER — FLUOROMETHOLONE 0.1 % OP SUSP: 1.0000 [drp] | Freq: Two times a day (BID) | OPHTHALMIC | Status: DC | PRN

## 2014-02-15 MED ORDER — EZETIMIBE 10 MG PO TABS
10.0000 mg | ORAL_TABLET | Freq: Every day | ORAL | Status: DC
  Administered 2014-02-15 – 2014-02-16 (×2): 10 mg via ORAL
  Filled 2014-02-15 (×2): qty 1

## 2014-02-15 MED ORDER — LIDOCAINE HCL (CARDIAC) 20 MG/ML IV SOLN
INTRAVENOUS | Status: AC
  Filled 2014-02-15: qty 5

## 2014-02-15 MED ORDER — PROMETHAZINE HCL 25 MG/ML IJ SOLN: 6.2500 mg | INTRAMUSCULAR | Status: DC | PRN

## 2014-02-15 MED ORDER — MECLIZINE HCL 12.5 MG PO TABS
25.0000 mg | ORAL_TABLET | Freq: Every day | ORAL | Status: DC
  Administered 2014-02-15 – 2014-02-16 (×2): 25 mg via ORAL
  Filled 2014-02-15 (×2): qty 2

## 2014-02-15 MED ORDER — LAMOTRIGINE 100 MG PO TABS
100.0000 mg | ORAL_TABLET | Freq: Every day | ORAL | Status: DC
  Administered 2014-02-15 – 2014-02-16 (×2): 100 mg via ORAL
  Filled 2014-02-15 (×2): qty 1

## 2014-02-15 MED ORDER — SODIUM CHLORIDE 0.9 % IV SOLN: 250.0000 mL | INTRAVENOUS | Status: DC

## 2014-02-15 MED ORDER — ACETAMINOPHEN 325 MG PO TABS: 650.0000 mg | ORAL_TABLET | ORAL | Status: DC | PRN

## 2014-02-15 MED ORDER — ALUM & MAG HYDROXIDE-SIMETH 200-200-20 MG/5ML PO SUSP: 30.0000 mL | Freq: Four times a day (QID) | ORAL | Status: DC | PRN

## 2014-02-15 MED ORDER — FLUTICASONE PROPIONATE 50 MCG/ACT NA SUSP
2.0000 | Freq: Two times a day (BID) | NASAL | Status: DC
  Administered 2014-02-15 – 2014-02-17 (×5): 2 via NASAL
  Filled 2014-02-15: qty 16

## 2014-02-15 MED ORDER — POLYETHYLENE GLYCOL 3350 17 G PO PACK
17.0000 g | PACK | Freq: Every day | ORAL | Status: DC | PRN
  Administered 2014-02-17: 17 g via ORAL
  Filled 2014-02-15: qty 1

## 2014-02-15 MED ORDER — 0.9 % SODIUM CHLORIDE (POUR BTL) OPTIME
TOPICAL | Status: DC | PRN
  Administered 2014-02-15: 1000 mL

## 2014-02-15 MED ORDER — ACETAMINOPHEN 650 MG RE SUPP: 650.0000 mg | RECTAL | Status: DC | PRN

## 2014-02-15 MED ORDER — HYDROCODONE-ACETAMINOPHEN 5-325 MG PO TABS: 1.0000 | ORAL_TABLET | ORAL | Status: DC | PRN

## 2014-02-15 MED ORDER — ROCURONIUM BROMIDE 50 MG/5ML IV SOLN
INTRAVENOUS | Status: AC
  Filled 2014-02-15: qty 1

## 2014-02-15 MED ORDER — PHENYLEPHRINE HCL 10 MG/ML IJ SOLN
INTRAMUSCULAR | Status: DC | PRN
  Administered 2014-02-15: 120 ug via INTRAVENOUS

## 2014-02-15 MED ORDER — PANTOPRAZOLE SODIUM 40 MG PO TBEC: 40.0000 mg | DELAYED_RELEASE_TABLET | Freq: Every day | ORAL | Status: DC

## 2014-02-15 MED ORDER — ONDANSETRON HCL 4 MG/2ML IJ SOLN
4.0000 mg | INTRAMUSCULAR | Status: DC | PRN
  Administered 2014-02-15 – 2014-02-17 (×2): 4 mg via INTRAVENOUS
  Filled 2014-02-15 (×2): qty 2

## 2014-02-15 MED ORDER — LACTATED RINGERS IV SOLN
INTRAVENOUS | Status: DC | PRN
  Administered 2014-02-15 (×2): via INTRAVENOUS

## 2014-02-15 MED ORDER — VITAMIN D3 25 MCG (1000 UNIT) PO TABS
1000.0000 [IU] | ORAL_TABLET | Freq: Every day | ORAL | Status: DC
  Administered 2014-02-15 – 2014-02-17 (×3): 1000 [IU] via ORAL
  Filled 2014-02-15 (×6): qty 1

## 2014-02-15 MED ORDER — SODIUM CHLORIDE 0.9 % IJ SOLN
3.0000 mL | Freq: Two times a day (BID) | INTRAMUSCULAR | Status: DC
  Administered 2014-02-15 – 2014-02-17 (×5): 3 mL via INTRAVENOUS

## 2014-02-15 MED ORDER — FLEET ENEMA 7-19 GM/118ML RE ENEM: 1.0000 | ENEMA | Freq: Once | RECTAL | Status: AC | PRN

## 2014-02-15 MED ORDER — ARIPIPRAZOLE 10 MG PO TABS
10.0000 mg | ORAL_TABLET | Freq: Every day | ORAL | Status: DC
  Administered 2014-02-15 – 2014-02-16 (×2): 10 mg via ORAL
  Filled 2014-02-15 (×3): qty 1

## 2014-02-15 MED ORDER — PROPOFOL INFUSION 10 MG/ML OPTIME
INTRAVENOUS | Status: DC | PRN
  Administered 2014-02-15: 50 ug/kg/min via INTRAVENOUS

## 2014-02-15 MED ORDER — ALPRAZOLAM 0.5 MG PO TABS
1.0000 mg | ORAL_TABLET | Freq: Four times a day (QID) | ORAL | Status: DC | PRN
  Administered 2014-02-15: 1 mg via ORAL
  Filled 2014-02-15: qty 2

## 2014-02-15 MED ORDER — FENTANYL CITRATE 0.05 MG/ML IJ SOLN
INTRAMUSCULAR | Status: DC | PRN
  Administered 2014-02-15: 50 ug via INTRAVENOUS
  Administered 2014-02-15 (×2): 100 ug via INTRAVENOUS
  Administered 2014-02-15: 150 ug via INTRAVENOUS
  Administered 2014-02-15: 50 ug via INTRAVENOUS

## 2014-02-15 MED ORDER — BUPROPION HCL ER (SR) 150 MG PO TB12
150.0000 mg | ORAL_TABLET | Freq: Two times a day (BID) | ORAL | Status: DC
  Administered 2014-02-15 – 2014-02-17 (×5): 150 mg via ORAL
  Filled 2014-02-15 (×5): qty 1

## 2014-02-15 MED ORDER — FAMOTIDINE 20 MG PO TABS
20.0000 mg | ORAL_TABLET | Freq: Two times a day (BID) | ORAL | Status: DC
  Administered 2014-02-15 – 2014-02-17 (×5): 20 mg via ORAL
  Filled 2014-02-15 (×5): qty 1

## 2014-02-15 MED ORDER — SENNA 8.6 MG PO TABS
1.0000 | ORAL_TABLET | Freq: Two times a day (BID) | ORAL | Status: DC
  Administered 2014-02-15 – 2014-02-17 (×4): 8.6 mg via ORAL
  Filled 2014-02-15 (×4): qty 1

## 2014-02-15 MED ORDER — OXYCODONE-ACETAMINOPHEN 5-325 MG PO TABS: 1.0000 | ORAL_TABLET | ORAL | Status: DC | PRN

## 2014-02-15 MED ORDER — DEXTROSE 5 % IV SOLN
10.0000 mg | INTRAVENOUS | Status: DC | PRN
  Administered 2014-02-15: 20 ug/min via INTRAVENOUS

## 2014-02-15 MED ORDER — BUPIVACAINE-EPINEPHRINE 0.25% -1:200000 IJ SOLN
INTRAMUSCULAR | Status: DC | PRN
  Administered 2014-02-15: 20 mL

## 2014-02-15 MED ORDER — SODIUM CHLORIDE 0.9 % IJ SOLN: 3.0000 mL | INTRAMUSCULAR | Status: DC | PRN

## 2014-02-15 MED ORDER — MENTHOL 3 MG MT LOZG: 1.0000 | LOZENGE | OROMUCOSAL | Status: DC | PRN

## 2014-02-15 MED ORDER — ONDANSETRON HCL 4 MG PO TABS: 4.0000 mg | ORAL_TABLET | Freq: Three times a day (TID) | ORAL | Status: DC | PRN

## 2014-02-15 MED ORDER — MEPERIDINE HCL 25 MG/ML IJ SOLN: 6.2500 mg | INTRAMUSCULAR | Status: DC | PRN

## 2014-02-15 MED ORDER — EPHEDRINE SULFATE 50 MG/ML IJ SOLN
INTRAMUSCULAR | Status: DC | PRN
  Administered 2014-02-15: 10 mg via INTRAVENOUS
  Administered 2014-02-15: 15 mg via INTRAVENOUS

## 2014-02-15 MED ORDER — HYDROMORPHONE HCL 2 MG PO TABS
2.0000 mg | ORAL_TABLET | Freq: Four times a day (QID) | ORAL | Status: DC | PRN
  Administered 2014-02-15: 2 mg via ORAL
  Filled 2014-02-15: qty 1

## 2014-02-15 MED ORDER — SURGIFOAM 100 EX MISC
CUTANEOUS | Status: DC | PRN
  Administered 2014-02-15: 09:00:00 via TOPICAL

## 2014-02-15 SURGICAL SUPPLY — 77 items
APL SKNCLS STERI-STRIP NONHPOA (GAUZE/BANDAGES/DRESSINGS) ×1
BAG DECANTER FOR FLEXI CONT (MISCELLANEOUS) ×3 IMPLANT
BENZOIN TINCTURE PRP APPL 2/3 (GAUZE/BANDAGES/DRESSINGS) ×3 IMPLANT
BLADE CLIPPER SURG (BLADE) IMPLANT
BOLT DECADE 5.5X40 (Bolt) ×1 IMPLANT
BOLT DECADE 5.5X40MM (Bolt) ×1 IMPLANT
BOLT PLATE XLIF 5.5X55 LRG (Bolt) ×1 IMPLANT
BOLT PLATE XLIF 5.5X55MM LRG (Bolt) ×1 IMPLANT
BOLT SPNL LRG 45X5.5XPLAT NS (Screw) IMPLANT
BONE MATRIX OSTEOCEL PRO MED (Bone Implant) ×4 IMPLANT
CLOSURE WOUND 1/2 X4 (GAUZE/BANDAGES/DRESSINGS) ×2
CONT SPEC 4OZ CLIKSEAL STRL BL (MISCELLANEOUS) ×2 IMPLANT
COROENT XL 8X22X45 (Orthopedic Implant) ×2 IMPLANT
COROENT XL-W 10X22X50 (Orthopedic Implant) ×2 IMPLANT
COVER BACK TABLE 24X17X13 BIG (DRAPES) IMPLANT
DRAPE C-ARM 42X72 X-RAY (DRAPES) ×3 IMPLANT
DRAPE C-ARMOR (DRAPES) ×3 IMPLANT
DRAPE LAPAROTOMY 100X72X124 (DRAPES) ×3 IMPLANT
DRAPE POUCH INSTRU U-SHP 10X18 (DRAPES) ×3 IMPLANT
DRAPE SURG 17X23 STRL (DRAPES) ×2 IMPLANT
DRSG OPSITE 4X5.5 SM (GAUZE/BANDAGES/DRESSINGS) ×2 IMPLANT
DRSG OPSITE POSTOP 3X4 (GAUZE/BANDAGES/DRESSINGS) ×2 IMPLANT
DRSG OPSITE POSTOP 4X6 (GAUZE/BANDAGES/DRESSINGS) ×2 IMPLANT
DRSG TELFA 3X8 NADH (GAUZE/BANDAGES/DRESSINGS) IMPLANT
ELECT BLADE 4.0 EZ CLEAN MEGAD (MISCELLANEOUS) ×3
ELECT REM PT RETURN 9FT ADLT (ELECTROSURGICAL) ×3
ELECTRODE BLDE 4.0 EZ CLN MEGD (MISCELLANEOUS) IMPLANT
ELECTRODE REM PT RTRN 9FT ADLT (ELECTROSURGICAL) ×1 IMPLANT
GAUZE SPONGE 4X4 16PLY XRAY LF (GAUZE/BANDAGES/DRESSINGS) IMPLANT
GLOVE BIO SURGEON STRL SZ8.5 (GLOVE) ×2 IMPLANT
GLOVE BIOGEL PI IND STRL 7.0 (GLOVE) IMPLANT
GLOVE BIOGEL PI IND STRL 7.5 (GLOVE) IMPLANT
GLOVE BIOGEL PI INDICATOR 7.0 (GLOVE) ×8
GLOVE BIOGEL PI INDICATOR 7.5 (GLOVE) ×2
GLOVE ECLIPSE 6.5 STRL STRAW (GLOVE) ×8 IMPLANT
GLOVE ECLIPSE 9.0 STRL (GLOVE) ×5 IMPLANT
GLOVE EXAM NITRILE LRG STRL (GLOVE) IMPLANT
GLOVE EXAM NITRILE MD LF STRL (GLOVE) IMPLANT
GLOVE EXAM NITRILE XL STR (GLOVE) IMPLANT
GLOVE EXAM NITRILE XS STR PU (GLOVE) IMPLANT
GLOVE SS BIOGEL STRL SZ 8 (GLOVE) IMPLANT
GLOVE SUPERSENSE BIOGEL SZ 8 (GLOVE) ×2
GOWN STRL REUS W/ TWL LRG LVL3 (GOWN DISPOSABLE) IMPLANT
GOWN STRL REUS W/ TWL XL LVL3 (GOWN DISPOSABLE) ×1 IMPLANT
GOWN STRL REUS W/TWL 2XL LVL3 (GOWN DISPOSABLE) IMPLANT
GOWN STRL REUS W/TWL LRG LVL3 (GOWN DISPOSABLE) ×6
GOWN STRL REUS W/TWL XL LVL3 (GOWN DISPOSABLE) ×6
KIT BASIN OR (CUSTOM PROCEDURE TRAY) ×3 IMPLANT
KIT DILATOR XLIF 5 (KITS) IMPLANT
KIT NDL NVM5 EMG ELECT (KITS) IMPLANT
KIT NEEDLE NVM5 EMG ELECT (KITS) ×1 IMPLANT
KIT NEEDLE NVM5 EMG ELECTRODE (KITS) ×2
KIT ROOM TURNOVER OR (KITS) ×3 IMPLANT
KIT SURGICAL ACCESS MAXCESS 4 (KITS) ×2 IMPLANT
KIT XLIF (KITS) ×2
LIQUID BAND (GAUZE/BANDAGES/DRESSINGS) ×6 IMPLANT
NDL HYPO 25X1 1.5 SAFETY (NEEDLE) ×1 IMPLANT
NEEDLE HYPO 25X1 1.5 SAFETY (NEEDLE) ×3 IMPLANT
NS IRRIG 1000ML POUR BTL (IV SOLUTION) ×3 IMPLANT
PACK LAMINECTOMY NEURO (CUSTOM PROCEDURE TRAY) ×3 IMPLANT
PAD DRESSING TELFA 3X8 NADH (GAUZE/BANDAGES/DRESSINGS) ×1 IMPLANT
PLATE 2H 10MM (Plate) ×2 IMPLANT
PLATE 2H DECADE 8MM (Plate) ×2 IMPLANT
PUTTY BONE DBX 2.5 MIS (Bone Implant) ×2 IMPLANT
SCREW 45MM (Screw) ×3 IMPLANT
SCREW DECADE 5.5X50 (Screw) ×2 IMPLANT
SPONGE LAP 4X18 X RAY DECT (DISPOSABLE) IMPLANT
STRIP CLOSURE SKIN 1/2X4 (GAUZE/BANDAGES/DRESSINGS) ×3 IMPLANT
SUT VIC AB 2-0 CT1 18 (SUTURE) ×6 IMPLANT
SUT VIC AB 3-0 SH 8-18 (SUTURE) ×6 IMPLANT
SWABSTICK BENZOIN STERILE (MISCELLANEOUS) ×1 IMPLANT
SYR 20ML ECCENTRIC (SYRINGE) ×3 IMPLANT
TAPE CLOTH 3X10 TAN LF (GAUZE/BANDAGES/DRESSINGS) ×9 IMPLANT
TOWEL OR 17X24 6PK STRL BLUE (TOWEL DISPOSABLE) ×3 IMPLANT
TOWEL OR 17X26 10 PK STRL BLUE (TOWEL DISPOSABLE) ×3 IMPLANT
TRAY FOLEY CATH 14FRSI W/METER (CATHETERS) ×3 IMPLANT
WATER STERILE IRR 1000ML POUR (IV SOLUTION) ×3 IMPLANT

## 2014-02-15 NOTE — Anesthesia Postprocedure Evaluation (Signed)
  Anesthesia Post-op Note  Patient: [name]  Procedure(s) Performed: Procedure(s) with comments: ANTERIOR LATERAL LUMBAR INTERBODY FUSION LUMBAR TWO-THREE,LUMBAR THREE-FOUR WITH LATERAL PLATE. (Left) - left   Patient Location: PACU  Anesthesia Type:General  Level of Consciousness: awake and alert   Airway and Oxygen Therapy: Patient Spontanous Breathing and Patient connected to nasal cannula oxygen  Post-op Pain: none  Post-op Assessment: Post-op Vital signs reviewed, Patient's Cardiovascular Status Stable, Respiratory Function Stable, Patent Airway and No signs of Nausea or vomiting  Post-op Vital Signs: Reviewed and stable  Last Vitals:  Filed Vitals:   02/15/14 1032  BP:   Pulse:   Temp:   Resp: 24    Complications: No apparent anesthesia complications

## 2014-02-15 NOTE — Progress Notes (Signed)
Patient admitted from PACU. Patient drowsy but easily arousable.

## 2014-02-15 NOTE — Progress Notes (Signed)
Pt. Refused the scopolamine patch.Pt. States she cant take it because it causes vertigo.

## 2014-02-15 NOTE — Brief Op Note (Signed)
02/15/2014  10:12 AM  PATIENT:  [name]  69 y.o. female  PRE-OPERATIVE DIAGNOSIS:  spondylosis  POST-OPERATIVE DIAGNOSIS:  spondylosis  PROCEDURE:  Procedure(s) with comments: EXTREME LUMBAR INTERBODY FUSION LUMBAR TWO-THREE,LUMBAR THREE-FOUR WITH LATERAL PLATE. (Left) - left   SURGEON:  Surgeon(s) and Role:    * Temple PaciniHenry A Hollee Fate, MD - Primary    * Tressie StalkerJeffrey Jenkins, MD - Assisting  PHYSICIAN ASSISTANT:   ASSISTANTS:    ANESTHESIA:   general  EBL:  Total I/O In: 1000 [I.V.:1000] Out: 210 [Urine:160; Blood:50]  BLOOD ADMINISTERED:none  DRAINS: none   LOCAL MEDICATIONS USED:  MARCAINE     SPECIMEN:  No Specimen  DISPOSITION OF SPECIMEN:  N/A  COUNTS:  YES  TOURNIQUET:  * No tourniquets in log *  DICTATION: .Dragon Dictation  PLAN OF CARE: Admit to inpatient   PATIENT DISPOSITION:  PACU - hemodynamically stable.   Delay start of Pharmacological VTE agent (>24hrs) due to surgical blood loss or risk of bleeding: yes

## 2014-02-15 NOTE — Op Note (Signed)
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  Postoperative diagnosis: Same  Procedure Name: Left L2-3 L3-4 anterior lateral retroperitoneal interbody decompression with fusion utilizing interbody peek cage, morcellized allograft, osteo-cell plus, and anterior lateral plate instrumentation.  Surgeon:Jasara Corrigan A.Ardenia Stiner, M.D.  Asst. Surgeon: Lovell SheehanJenkins  Anesthesia: General  Indication: 69 year old female status post previous L4-5 decompression infusion with recently good result presents with worsening back and lower extremity pain consistent with a mixed lumbar radiculopathy. Workup demonstrates evidence of progressive adjacent level disc degeneration with spondylosis and lateral listhesis and a resultant degenerative scoliosis. Patient presents now for two-level anterior lateral decompression infusion in hopes of improving her symptoms.  Operative note: After induction of anesthesia, patient positioned the right lateral decubitus position and a properly padded. Patient was positioned with the bed flexed. Localizing fluoroscopy was used and the patient was positioned appropriately. Left flank was prepped and draped sterilely. Incision was made overlying the vertebral body of L3 and a secondary incision was made in the left flank. Using the secondary incision blunt dissection was used to enter the retroperitoneal space. Risperdal space was dissected free and the peroneal contents were mobilized anteriorly. Returning to the flank wound a dilator was placed and passed through the psoas muscle and docked into the disc space of L2-3. This was confirmed by fluoroscopic guidance. Intraoperative neural monitoring was performed and a safe passage free of adjacent neural structures was confirmed. A guidewire was passed in the disc space at L2-3. The dilators were  progressively enlarged and a self retaining retractor was placed. Direct stimulation was performed and again no adjacent nerves were discovered. A shim was then impacted into the disc space. Guidewire was removed. Retractor widened area and discectomy then performed using 15 blade and pituitary rongeurs. Various curettes were used and an aggressive discectomy was performed. Contralateral Lee's was performed both along the endplate of L2 and L3 utilizing Cobb elevators. Disc space was progressively dilated and 8 mm implant was found to be most appropriate at L2-3. An 8 mm x 22 mm x 45 mm implant was then packed with DBX putty and osteo-cell plus. This was then impacted into place under fluoroscopic guidance. Slides were removed and the cage was confirmed to be in good position by fluoroscopy. An 8 mm lateral plate was then placed over the vertebral bodies of L2 and L3. This was then attached with bicortical screws into L2 and L3. Each screw had good purchase and was securely affixed to the plate. Locking mechanism was engaged. Final images revealed good position of the cages and plate and screws with normal lamina spine at L2-3. Procedure then repeated at L3-4 again without complication. A 10 mm lordotic cage by 50 mm x 22 mm was used at this level and a 10 mm plate was used to this level. Wound was then irrigated out like solution. Hemostasis wasn't assured. Wounds and close in layers with Vicryl sutures. Final images revealed good position of the cages and implants without evidence of complication or problem. Spinal alignment was normal. The patient tolerated the procedure well and she returns they're covering postop.

## 2014-02-15 NOTE — H&P (Signed)
[name] is an 69 y.o. female.   Chief Complaint: Back pain  HPI: 69 year old female with intractable back pain. Patient status post previous L4-5 decompression and fusion for degenerative spondylolisthesis presents now with progressive lateral listhesis with foraminal collapse and scoliosis at L2-3 and L3-4. Patient presents now for two-level lumbar decompression and fusion in hopes of improving her symptoms.  Past Medical History  Diagnosis Date  . Cough   . Migraine   . Fibromyalgia   . Bipolar affective disorder   . GERD (gastroesophageal reflux disease)   . Left leg DVT 2010  . DJD (degenerative joint disease) of lumbar spine   . Family history of anesthesia complication     Hx: father has nausea  . Hypertension     Hx: of in 2011  . Pneumonia   . Vertigo     Hx: of  . DVT (deep venous thrombosis) 2010    groin left - on coumadin for 6 months  . Vocal cord dysfunction     sees dr. Delton Coombes  . Shortness of breath     exertion  . Anxiety   . Depression   . Spinal headache   . PONV (postoperative nausea and vomiting)     and migraines    Past Surgical History  Procedure Laterality Date  . Cholecystectomy  1989  . Tonsillectomy  1955  . Partial hysterectomy  1980  . Breast enhancement surgery  1989  . Cervical fusion  2005  . Spinal cord stimulator implant  2010  . Carpal tunnel release  2006    Right hand  . Tarsal suspension      Hx; of left thumb  . Cataract extraction w/ intraocular lens  implant, bilateral      Hx: of  . Appendectomy    . Colonoscopy w/ biopsies and polypectomy      Hx: of  . Upper gi endoscopy      Hx: of  . Lumbar laminectomy/decompression microdiscectomy Right 11/06/2012    Procedure: LUMBAR TWO THREE LUMBAR LAMINECTOMY/DECOMPRESSION MICRODISCECTOMY 1 LEVEL;  Surgeon: Temple Pacini, MD;  Location: [facility];  Service: Neurosurgery;  Laterality: Right;  . Fracture surgery Right 09/2013    ankle and leg - plate and screws  . Finger  arthroscopy with carpometacarpel (cmc) arthroplasty      thumb    Family History  Problem Relation Age of Onset  . Lung cancer Mother   . Prostate cancer Father   . Rheum arthritis Paternal Grandmother   . Asthma Sister   . Heart disease Father    Social History:  reports that she quit smoking about 27 years ago. Her smoking use included Cigarettes. She has a 52.5 pack-year smoking history. She has never used smokeless tobacco. She reports that she does not drink alcohol or use illicit drugs.  Allergies:  Allergies  Allergen Reactions  . Lithium Other (See Comments)    Migraines and "drunk"  . Lisinopril Other (See Comments)    kidney failure  . Scopolamine     Causes vertigo  . Statins Other (See Comments)    Very weak and achy  . Adhesive [Tape] Rash    All tape, paper included.    Medications Prior to Admission  Medication Sig Dispense Refill  . ALPRAZolam (XANAX) 1 MG tablet Take 1 mg by mouth 4 (four) times daily as needed for anxiety or sleep (1 tablet midday & 3 tablets at bedtime every night, Pt can take med up  to four times a day).     . ARIPiprazole (ABILIFY) 10 MG tablet Take 10 mg by mouth at bedtime.     . Ascorbic Acid (VITAMIN C GUMMIE PO) Take 1 tablet by mouth daily.    Marland Kitchen. buPROPion (WELLBUTRIN SR) 150 MG 12 hr tablet Take 150-300 mg by mouth 2 (two) times daily. 300 mg in the morning and 150 mg at bedtime    . butalbital-acetaminophen-caffeine (FIORICET, ESGIC) 50-325-40 MG per tablet Take 1 tablet by mouth 2 (two) times daily as needed for headache.     . Calcium-Phosphorus-Vitamin D (CALCIUM GUMMIES PO) Take 2 tablets by mouth daily.    . Cholecalciferol (VITAMIN D3 ADULT GUMMIES) 1000 UNITS CHEW Chew 1,000 Units by mouth daily.    . cyclobenzaprine (FLEXERIL) 10 MG tablet Take 20 mg by mouth at bedtime.     . cycloSPORINE (RESTASIS) 0.05 % ophthalmic emulsion Place 1 drop into both eyes 2 (two) times daily.    Marland Kitchen. DOCUSATE SODIUM PO Take 1 tablet by mouth 2  (two) times daily.     Marland Kitchen. esomeprazole (NEXIUM) 40 MG capsule Take 40 mg by mouth daily before breakfast.      . ezetimibe (ZETIA) 10 MG tablet Take 10 mg by mouth at bedtime.     . famotidine (PEPCID) 20 MG tablet Take 1 tablet (20 mg total) by mouth 2 (two) times daily. 30 tablet 0  . fluorometholone (FML) 0.1 % ophthalmic suspension Place 1 drop into both eyes 2 (two) times daily as needed (Gritty, sticky eyes).     Marland Kitchen. FLUoxetine (PROZAC) 40 MG capsule Take 40 mg by mouth daily.     . fluticasone (FLONASE) 50 MCG/ACT nasal spray Place 2 sprays into both nostrils 2 (two) times daily. 16 g 11  . furosemide (LASIX) 40 MG tablet Take 40 mg by mouth 2 (two) times daily.     Marland Kitchen. HYDROmorphone (DILAUDID) 2 MG tablet Take 1 tablet (2 mg total) by mouth every 6 (six) hours as needed for pain. (Patient taking differently: Take 2 mg by mouth every 8 (eight) hours as needed for moderate pain or severe pain. ) 80 tablet 0  . ibuprofen (ADVIL,MOTRIN) 200 MG tablet Take 200 mg by mouth every 6 (six) hours as needed.      . lamoTRIgine (LAMICTAL) 100 MG tablet Take 100 mg by mouth at bedtime.      . Loratadine 10 MG CAPS Take 1 capsule by mouth daily.      Marland Kitchen. LYRICA 150 MG capsule Take 300 mg by mouth at bedtime.     . meclizine (ANTIVERT) 25 MG tablet Take 25 mg by mouth at bedtime.     . Melatonin 300 MCG TABS Take 1 tablet by mouth at bedtime as needed (for sleep).     . methylphenidate (RITALIN LA) 40 MG 24 hr capsule Take 80 mg by mouth daily.    . Multiple Vitamin (MULTIVITAMIN) capsule Take 1 capsule by mouth daily.      . niacin 500 MG tablet Take 500 mg by mouth at bedtime.    Marland Kitchen. omeprazole (PRILOSEC) 20 MG capsule Take 20 mg by mouth at bedtime.      . ondansetron (ZOFRAN) 4 MG tablet 1 tablet every 8 (eight) hours as needed.    Bertram Gala. Polyethyl Glycol-Propyl Glycol (SYSTANE) 0.4-0.3 % SOLN Place 1 drop into both eyes 3 (three) times daily.     . potassium gluconate 595 MG TABS Take 595 mg by mouth 2 (  two)  times daily.     . pramipexole (MIRAPEX) 0.5 MG tablet Take 0.5 mg by mouth at bedtime.      . Teriparatide, Recombinant, (FORTEO) 600 MCG/2.4ML SOLN Inject 20 mcg into the skin daily.    Marland Kitchen zonisamide (ZONEGRAN) 100 MG capsule Take 200 mg by mouth at bedtime.     Marland Kitchen albuterol (PROVENTIL HFA;VENTOLIN HFA) 108 (90 BASE) MCG/ACT inhaler Inhale 2 puffs into the lungs every 6 (six) hours as needed for wheezing.      No results found for this or any previous visit (from the past 48 hour(s)). No results found.  Review of Systems  Constitutional: Negative.   HENT: Negative.   Eyes: Negative.   Respiratory: Negative.   Cardiovascular: Negative.   Gastrointestinal: Negative.   Genitourinary: Negative.   Musculoskeletal: Negative.   Skin: Negative.   Neurological: Negative.   Endo/Heme/Allergies: Negative.   Psychiatric/Behavioral: Negative.     Blood pressure 102/53, pulse 57, temperature 98.8 F (37.1 C), temperature source Oral, resp. rate 18, weight 78.472 kg (173 lb), SpO2 100 %. Physical Exam  Constitutional: She is oriented to person, place, and time. She appears well-developed and well-nourished. No distress.  HENT:  Head: Normocephalic and atraumatic.  Right Ear: External ear normal.  Left Ear: External ear normal.  Nose: Nose normal.  Mouth/Throat: Oropharynx is clear and moist. No oropharyngeal exudate.  Eyes: Conjunctivae and EOM are normal. Pupils are equal, round, and reactive to light. Right eye exhibits no discharge. Left eye exhibits no discharge.  Neck: Normal range of motion. Neck supple. No tracheal deviation present. No thyromegaly present.  Cardiovascular: Normal rate, regular rhythm and intact distal pulses.  Exam reveals no friction rub.   No murmur heard. Respiratory: Effort normal and breath sounds normal. No respiratory distress. She has no wheezes.  GI: Soft. Bowel sounds are normal. She exhibits no distension. There is no tenderness.  Musculoskeletal: Normal  range of motion. She exhibits no edema or tenderness.  Neurological: She is alert and oriented to person, place, and time. She displays normal reflexes. No cranial nerve deficit. She exhibits normal muscle tone. Coordination normal.  Skin: Skin is warm and dry. No rash noted. She is not diaphoretic. No erythema. No pallor.  Psychiatric: She has a normal mood and affect. Her behavior is normal. Judgment and thought content normal.     Assessment/Plan L2-3, L3-4 spondylosis with degenerative scoliosis and stenosis. Plan left L2-3 L3-4 anterior lateral retroperitoneal interbody decompression and fusion with interbody cage, morcellized allograft, and lateral plate instrumentation. Risks and benefits of been explained. Patient wishes to proceed.  Luvina Poirier A 02/15/2014, 7:36 AM

## 2014-02-15 NOTE — Evaluation (Addendum)
Physical Therapy Evaluation Patient Details Name: [name] MRN: [id number] DOB: 01-05-1946 Today's Date: 02/15/2014   History of Present Illness  Patient is a 69 y/o female s/p left anterior lateral lumbar interbody fusion L2-3, 3-4. PMH of L4-5 decompression and fusion for degenerative spondylolisthesis, HTN, DVT, depression and anxiety.    Clinical Impression  Patient presents with pain and generalized weakness s/p above surgery. Mobility assessment limited due to not having back brace present in room. Tolerated standing and taking a few steps along side bed for better positioning. Pt with high pain tolerance. Education provided on back precautions. Pt would benefit from skilled PT to improve transfers, gait, balance and mobility so pt can maximize independence and return to PLOF.    Follow Up Recommendations Home health PT;Supervision/Assistance - 24 hour    Equipment Recommendations  None recommended by PT    Recommendations for Other Services       Precautions / Restrictions Precautions Precautions: Back Precaution Booklet Issued: No Precaution Comments: Education provided on back precautions. Required Braces or Orthoses: Spinal Brace Spinal Brace:  (Pt has a brace from home. Will have it in room tomorrow AM.) Restrictions Weight Bearing Restrictions: No Other Position/Activity Restrictions: Pt requesting new brace as old brace "rides up and is not comfortable." RN made aware to contact Dr. Dutch Quint for orders for new brace as appropriate.      Mobility  Bed Mobility Overal bed mobility: Needs Assistance Bed Mobility: Rolling;Sidelying to Sit Rolling: Supervision Sidelying to sit: HOB elevated;Supervision  Sit to sidelying: Min A to bring BLEs into bed.     General bed mobility comments: Cues for log roll technique. Supervision for safety. Pt has hospital bed at home.  Transfers Overall transfer level: Needs assistance Equipment used: Rolling walker (2  wheeled) Transfers: Sit to/from Stand Sit to Stand: Min guard         General transfer comment: Min guard for safety. Stood from [employer]. Cues for hand placement.  Ambulation/Gait Ambulation/Gait assistance: Min guard Ambulation Distance (Feet): 2 Feet Assistive device: Rolling walker (2 wheeled) Gait Pattern/deviations: Step-to pattern;Decreased stride length;Trunk flexed     General Gait Details: Pt tolerated taking a few steps along side bed for better positioing in bed. Distance limited as pt does not have brace present in room.  Stairs            Wheelchair Mobility    Modified Rankin (Stroke Patients Only)       Balance Overall balance assessment: Needs assistance Sitting-balance support: Feet supported;No upper extremity supported Sitting balance-Leahy Scale: Fair     Standing balance support: During functional activity Standing balance-Leahy Scale: Poor Standing balance comment: Requires BUE support on RW for balance/safety.                             Pertinent Vitals/Pain Pain Assessment: 0-10 Pain Score: 9  Pain Location: back  Pain Descriptors / Indicators: Sore;Aching Pain Intervention(s): Limited activity within patient's tolerance;Monitored during session;Repositioned    Home Living Family/patient expects to be discharged to:: Private residence Living Arrangements: Spouse/significant other Available Help at Discharge: Family;Available 24 hours/day Type of Home: House Home Access: Ramped entrance     Home Layout: One level Home Equipment: Walker - 2 wheels;Cane - single point;Crutches;Hospital bed;Wheelchair - [employer];Shower seat      Prior Function Level of Independence: Independent with assistive device(s)         Comments: Pt furniture walking or  using SPC PTA. Partner does cooking/cleaning.      Hand Dominance        Extremity/Trunk Assessment   Upper Extremity Assessment: Defer to OT evaluation            Lower Extremity Assessment: Generalized weakness         Communication   Communication: No difficulties  Cognition Arousal/Alertness: Awake/alert Behavior During Therapy: WFL for tasks assessed/performed Overall Cognitive Status: Within Functional Limits for tasks assessed       Memory: Decreased recall of precautions              General Comments General comments (skin integrity, edema, etc.): Partner present in room during PT evaluation.    Exercises        Assessment/Plan    PT Assessment Patient needs continued PT services  PT Diagnosis Generalized weakness;Acute pain   PT Problem List Decreased strength;Pain;Decreased activity tolerance;Decreased balance;Decreased mobility;Decreased knowledge of precautions  PT Treatment Interventions Balance training;Gait training;Neuromuscular re-education;Patient/family education;Functional mobility training;Therapeutic exercise;Therapeutic activities   PT Goals (Current goals can be found in the Care Plan section) Acute Rehab PT Goals Patient Stated Goal: to return home after a few days PT Goal Formulation: With patient Time For Goal Achievement: 03/01/14 Potential to Achieve Goals: Good    Frequency Min 5X/week   Barriers to discharge        Co-evaluation               End of Session Equipment Utilized During Treatment: Gait belt Activity Tolerance: Patient limited by pain;Other (comment) (Limited due to not having back brace in room.) Patient left: in bed;with call bell/phone within reach;with bed alarm set;with family/visitor present;with nursing/sitter in room Nurse Communication: Mobility status;Other (comment) (Back brace)         Time: [id number] PT Time Calculation (min) (ACUTE ONLY): 30 min   Charges:   PT Evaluation $Initial PT Evaluation Tier I: 1 Procedure PT Treatments $Therapeutic Activity: 8-22 mins   PT G CodesAlvie Heidelberg:        Folan, Karyss Frese A 02/15/2014, 1:50 PM Alvie HeidelbergShauna Folan,  PT, DPT [phone]

## 2014-02-15 NOTE — Progress Notes (Signed)
Utilization review completed.  

## 2014-02-15 NOTE — Transfer of Care (Signed)
Immediate Anesthesia Transfer of Care Note  Patient: [name]  Procedure(s) Performed: Procedure(s) with comments: ANTERIOR LATERAL LUMBAR INTERBODY FUSION LUMBAR TWO-THREE,LUMBAR THREE-FOUR WITH LATERAL PLATE. (Left) - left   Patient Location: PACU  Anesthesia Type:General  Level of Consciousness: lethargic and responds to stimulation  Airway & Oxygen Therapy: Patient Spontanous Breathing and Patient connected to nasal cannula oxygen  Post-op Assessment: Report given to PACU RN  Post vital signs: Reviewed and stable  Complications: No apparent anesthesia complications

## 2014-02-15 NOTE — Anesthesia Procedure Notes (Signed)
Procedure Name: Intubation Date/Time: 02/15/2014 7:52 AM Performed by: Jefm MilesENNIE, Delance Weide E Pre-anesthesia Checklist: Patient identified, Emergency Drugs available, Suction available, Patient being monitored and Timeout performed Patient Re-evaluated:Patient Re-evaluated prior to inductionOxygen Delivery Method: Circle system utilized Preoxygenation: Pre-oxygenation with 100% oxygen Intubation Type: IV induction Ventilation: Mask ventilation without difficulty Laryngoscope Size: Mac and 3 Grade View: Grade I Tube type: Oral Tube size: 7.0 mm Number of attempts: 1 Airway Equipment and Method: Stylet Placement Confirmation: ETT inserted through vocal cords under direct vision,  positive ETCO2 and breath sounds checked- equal and bilateral Secured at: 22 cm Tube secured with: Tape Dental Injury: Teeth and Oropharynx as per pre-operative assessment

## 2014-02-16 NOTE — Progress Notes (Signed)
Physical Therapy Treatment Patient Details Name: Denay Pleitez MRN: [id number] DOB: 1945-08-19 Today's Date: 02/16/2014    History of Present Illness Patient is a 69 y/o female s/p left anterior lateral lumbar interbody fusion L2-3, 3-4. PMH of L4-5 decompression and fusion for degenerative spondylolisthesis, HTN, DVT, depression and anxiety.    PT Comments    Patient making good gains with mobility and gait.  Continues to require cuing for back precautions.    Follow Up Recommendations  Home health PT;Supervision/Assistance - 24 hour     Equipment Recommendations  None recommended by PT    Recommendations for Other Services       Precautions / Restrictions Precautions Precautions: Back Precaution Comments: Reviewed back precautions.  Patient continues to need reminders to maintain back precautions during functional mobility. Required Braces or Orthoses: Spinal Brace Spinal Brace: Lumbar corset;Applied in sitting position (Patient can don independently) Restrictions Weight Bearing Restrictions: No    Mobility  Bed Mobility Overal bed mobility: Needs Assistance Bed Mobility: Rolling;Sidelying to Sit Rolling: Supervision Sidelying to sit: Supervision;HOB elevated       General bed mobility comments: Cues for log roll technique. Supervision for safety. Pt has hospital bed at home.  Transfers Overall transfer level: Needs assistance Equipment used: Rolling walker (2 wheeled) Transfers: Sit to/from Stand Sit to Stand: Supervision         General transfer comment: Supervision for safety  Ambulation/Gait Ambulation/Gait assistance: Min guard Ambulation Distance (Feet): 120 Feet Assistive device: Rolling walker (2 wheeled) Gait Pattern/deviations: Step-through pattern;Decreased stride length Gait velocity: Decreased Gait velocity interpretation: Below normal speed for age/gender General Gait Details: Patient with slow guarded gait.  Pain increased from 6 to 8 with  gait.  Cues to maintain back precautions - no twisting in stance.   Stairs            Wheelchair Mobility    Modified Rankin (Stroke Patients Only)       Balance                                    Cognition Arousal/Alertness: Awake/alert Behavior During Therapy: WFL for tasks assessed/performed Overall Cognitive Status: Within Functional Limits for tasks assessed                      Exercises      General Comments        Pertinent Vitals/Pain Pain Assessment: 0-10 Pain Score: 8  (with ambulation) Pain Location: back Pain Descriptors / Indicators: Aching Pain Intervention(s): Monitored during session;Repositioned    Home Living                      Prior Function            PT Goals (current goals can now be found in the care plan section) Progress towards PT goals: Progressing toward goals    Frequency  Min 5X/week    PT Plan Current plan remains appropriate    Co-evaluation             End of Session Equipment Utilized During Treatment: Gait belt;Back brace Activity Tolerance: Patient limited by pain;Patient limited by fatigue Patient left: in chair;with call bell/phone within reach     Time: 1556-1620 PT Time Calculation (min) (ACUTE ONLY): 24 min  Charges:  $Gait Training: 23-37 mins  G Codes:      Vena AustriaDavis, Elbert Polyakov H 02/16/2014, 6:51 PM Durenda HurtSusan H. Renaldo Fiddleravis, PT, [facility] Acute Rehab Services Pager [phone]

## 2014-02-16 NOTE — Progress Notes (Signed)
Patient ID: [name], female   DOB: Jun 19, 1945, 69 y.o.   MRN: [id number] Vital signs are stable Incisions are clean and dry Motor function appears good in lower extremities Patient notes moderate soreness in back and limited mobility Patient notes she needs to be much more agile as she lives with 25 cats and 6 dogs in her house.

## 2014-02-16 NOTE — Evaluation (Signed)
Occupational Therapy Evaluation Patient Details Name: [name] MRN: [id number] DOB: [birth year] Today's Date: 02/16/2014    History of Present Illness Patient is a 69 y/o female s/p left anterior lateral lumbar interbody fusion L2-3, 3-4. PMH of L4-5 decompression and fusion for degenerative spondylolisthesis, HTN, DVT, depression and anxiety.   Clinical Impression   Pt admitted with back surgery. Pt currently with functional limitations due to the deficits listed below (see OT Problem List).  Pt will benefit from skilled OT to increase their safety and independence with ADL and functional mobility for ADL to facilitate discharge to venue listed below.      Follow Up Recommendations  No OT follow up    Equipment Recommendations  None recommended by OT    Recommendations for Other Services       Precautions / Restrictions Precautions Precautions: Back Precaution Booklet Issued: No Precaution Comments: Education provided on back precautions. Required Braces or Orthoses: Spinal Brace Spinal Brace:  (Pt has a brace from home. Will have it in room tomorrow AM.) Restrictions Weight Bearing Restrictions: No Other Position/Activity Restrictions: new brace delivered      Mobility Bed Mobility Overal bed mobility: Needs Assistance Bed Mobility: Rolling;Sidelying to Sit Rolling: Supervision Sidelying to sit: HOB elevated;Supervision       General bed mobility comments: Cues for log roll technique. Supervision for safety. Pt has hospital bed at home.  Transfers Overall transfer level: Needs assistance Equipment used: Rolling walker (2 wheeled) Transfers: Sit to/from Stand Sit to Stand: Min guard         General transfer comment: Min guard for safety.     Balance                                            ADL Overall ADL's : Needs assistance/impaired     Grooming: Set up;Sitting   Upper Body Bathing: Set up;Sitting   Lower Body Bathing:  Moderate assistance;Sit to/from stand   Upper Body Dressing : Set up;Sitting   Lower Body Dressing: Moderate assistance;Sit to/from stand   Toilet Transfer: Min guard;RW   Toileting- ArchitectClothing Manipulation and Hygiene: Minimal assistance;Sit to/from stand                         Pertinent Vitals/Pain Pain Score: 6  Pain Location: back Pain Descriptors / Indicators: Sore Pain Intervention(s): Limited activity within patient's tolerance;Monitored during session;Repositioned     Hand Dominance     Extremity/Trunk Assessment Upper Extremity Assessment Upper Extremity Assessment: Defer to OT evaluation           Communication Communication Communication: No difficulties   Cognition Arousal/Alertness: Awake/alert Behavior During Therapy: WFL for tasks assessed/performed Overall Cognitive Status: Within Functional Limits for tasks assessed                                Home Living Family/patient expects to be discharged to:: Private residence Living Arrangements: Spouse/significant other Available Help at Discharge: Family;Available 24 hours/day Type of Home: House Home Access: Ramped entrance     Home Layout: One level               Home Equipment: Walker - 2 wheels;Cane - single point;Crutches;Hospital bed;Wheelchair - [employer];Bedside commode;Shower seat          Prior Functioning/Environment  Level of Independence: Independent with assistive device(s)        Comments: Pt furniture walking or using SPC PTA. Partner does cooking/cleaning.     OT Diagnosis: Generalized weakness   OT Problem List: Decreased strength;Decreased knowledge of precautions   OT Treatment/Interventions: Self-care/ADL training;DME and/or AE instruction;Patient/family education    OT Goals(Current goals can be found in the care plan section) Acute Rehab OT Goals Patient Stated Goal: to return home after a few days ADL Goals Pt Will Perform Grooming: with  modified independence;standing Pt Will Perform Lower Body Dressing: with modified independence;sit to/from stand Pt Will Transfer to Toilet: with modified independence;regular height toilet Pt Will Perform Toileting - Clothing Manipulation and hygiene: with modified independence;sit to/from stand  OT Frequency: Min 2X/week   Barriers to D/C:            Co-evaluation              End of Session Nurse Communication: Mobility status  Activity Tolerance: Patient tolerated treatment well Patient left: in chair;with call bell/phone within reach;with chair alarm set   Time: 1610-9604 OT Time Calculation (min): 24 min Charges:  OT General Charges $OT Visit: 1 Procedure OT Evaluation $Initial OT Evaluation Tier I: 1 Procedure OT Treatments $Self Care/Home Management : 8-22 mins G-Codes:    Einar Crow D 02-22-2014, 9:27 AM

## 2014-02-17 MED ORDER — ONDANSETRON HCL 4 MG PO TABS: 4.0000 mg | ORAL_TABLET | Freq: Three times a day (TID) | ORAL | Status: DC | PRN

## 2014-02-17 MED ORDER — HYDROMORPHONE HCL 4 MG PO TABS: 4.0000 mg | ORAL_TABLET | ORAL | Status: DC | PRN

## 2014-02-17 NOTE — Progress Notes (Signed)
Physical Therapy Treatment Patient Details Name: [name] MRN: [id number] DOB: [birth year] Today's Date: 02/17/2014    History of Present Illness Patient is a 69 y/o female s/p left anterior lateral lumbar interbody fusion L2-3, 3-4. PMH of L4-5 decompression and fusion for degenerative spondylolisthesis, HTN, DVT, depression and anxiety.    PT Comments    Pt eager to participate in therapy despite feeling "nauseous". Pt hopeful to D/C home today. Reviewed back precautions. Pt reports she will have 24/7 (A) at home. Will cont to follow per POC.   Follow Up Recommendations  Home health PT;Supervision/Assistance - 24 hour;Other (comment) (may not need HH )     Equipment Recommendations  None recommended by PT    Recommendations for Other Services       Precautions / Restrictions Precautions Precautions: Back Precaution Comments: reviewed back precautions during session  Required Braces or Orthoses: Spinal Brace Spinal Brace: Lumbar corset;Applied in sitting position Restrictions Weight Bearing Restrictions: No    Mobility  Bed Mobility                  Transfers Overall transfer level: Modified independent Equipment used: Rolling walker (2 wheeled)             General transfer comment: good technique   Ambulation/Gait Ambulation/Gait assistance: Supervision Ambulation Distance (Feet): 200 Feet Assistive device: Rolling walker (2 wheeled) Gait Pattern/deviations: Step-through pattern;Decreased stride length Gait velocity: Decreased Gait velocity interpretation: Below normal speed for age/gender General Gait Details: supervision for safety; discussed back precautions with directional changes    Stairs            Wheelchair Mobility    Modified Rankin (Stroke Patients Only)       Balance Overall balance assessment: Needs assistance Sitting-balance support: Feet supported;No upper extremity supported Sitting balance-Leahy Scale: Good      Standing balance support: During functional activity;No upper extremity supported Standing balance-Leahy Scale: Fair Standing balance comment: stood without UE support for short bout; no LOB noted                    Cognition Arousal/Alertness: Awake/alert Behavior During Therapy: WFL for tasks assessed/performed Overall Cognitive Status: Within Functional Limits for tasks assessed                      Exercises      General Comments General comments (skin integrity, edema, etc.): discussed home management safety in regards to pets       Pertinent Vitals/Pain Pain Assessment: 0-10 Pain Score: 6  Pain Location: abdomen area  Pain Descriptors / Indicators:  ("kicked in the stomach" ) Pain Intervention(s): Monitored during session;Premedicated before session;Repositioned    Home Living                      Prior Function            PT Goals (current goals can now be found in the care plan section) Acute Rehab PT Goals Patient Stated Goal: to go home today PT Goal Formulation: With patient Time For Goal Achievement: 03/01/14 Potential to Achieve Goals: Good Progress towards PT goals: Progressing toward goals    Frequency  Min 5X/week    PT Plan Current plan remains appropriate    Co-evaluation             End of Session Equipment Utilized During Treatment: Back brace Activity Tolerance: Patient tolerated treatment well Patient left: in chair;with call bell/phone  within reach;with chair alarm set     Time: 0815-0826 PT Time Calculation (min) (ACUTE ONLY): 11 min  Charges:  $Gait Training: 8-22 mins                    G Codes:      Donnamarie PoagWest, [place] Difranco Boys TownN, [place] CarolinaPT  [phone] 02/17/2014, 8:33 AM

## 2014-02-17 NOTE — Progress Notes (Signed)
Patient d/c home. Dc instruction given and patient/ caregiver verbalized understanding.Condition stable.

## 2014-02-17 NOTE — Discharge Summary (Signed)
Physician Discharge Summary  Patient ID: [name] MRN: [id number] DOB/AGE: [birth year] 69 y.o.  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 Jordan LikesPool performed in L2-3 and L3-4 anterior lateral arthrodesis with anterior instrumentation on the patient on 02/15/2014.  The patient's postoperative course was unremarkable. On postoperative day #2 the patient requested discharge home. She was given oral and written discharge instructions. All her questions were answered.  Consults: None Significant Diagnostic Studies: None Treatments: L2-3 and L3-4 anterior lateral interbody arthrodesis and instrumentation Discharge Exam: Blood pressure 107/58, pulse 64, temperature 99.1 F (37.3 C), temperature source Oral, resp. rate 18, height 5\' 3"  (1.6 m), weight 79.379 kg (175 lb), SpO2 97 %. The patient is alert and pleasant. Her strength is grossly normal in her lower extremities.  Disposition: Home     Medication List    STOP taking these medications        ibuprofen 200 MG tablet  Commonly known as:  ADVIL,MOTRIN      TAKE these medications        albuterol 108 (90 BASE) MCG/ACT inhaler  Commonly known as:  PROVENTIL HFA;VENTOLIN HFA  Inhale 2 puffs into the lungs every 6 (six) hours as needed for wheezing.     ALPRAZolam 1 MG tablet  Commonly known as:  XANAX  Take 1 mg by mouth 4 (four) times daily as needed for anxiety or sleep (1 tablet midday & 3 tablets at bedtime every night, Pt can take med up to four times a day).     ARIPiprazole 10 MG tablet  Commonly known as:  ABILIFY  Take 10 mg by mouth at bedtime.     buPROPion 150 MG 12 hr tablet  Commonly known as:   WELLBUTRIN SR  Take 150-300 mg by mouth 2 (two) times daily. 300 mg in the morning and 150 mg at bedtime     butalbital-acetaminophen-caffeine 50-325-40 MG per tablet  Commonly known as:  FIORICET, ESGIC  Take 1 tablet by mouth 2 (two) times daily as needed for headache.     CALCIUM GUMMIES PO  Take 2 tablets by mouth daily.     cyclobenzaprine 10 MG tablet  Commonly known as:  FLEXERIL  Take 20 mg by mouth at bedtime.     cycloSPORINE 0.05 % ophthalmic emulsion  Commonly known as:  RESTASIS  Place 1 drop into both eyes 2 (two) times daily.     DOCUSATE SODIUM PO  Take 1 tablet by mouth 2 (two) times daily.     esomeprazole 40 MG capsule  Commonly known as:  NEXIUM  Take 40 mg by mouth daily before breakfast.     ezetimibe 10 MG tablet  Commonly known as:  ZETIA  Take 10 mg by mouth at bedtime.     famotidine 20 MG tablet  Commonly known as:  PEPCID  Take 1 tablet (20 mg total) by mouth 2 (two) times daily.     fluorometholone 0.1 % ophthalmic suspension  Commonly known as:  FML  Place 1 drop into both eyes 2 (two) times daily as needed (Gritty, sticky eyes).     FLUoxetine 40 MG capsule  Commonly known as:  PROZAC  Take 40 mg by mouth daily.     fluticasone 50 MCG/ACT  nasal spray  Commonly known as:  FLONASE  Place 2 sprays into both nostrils 2 (two) times daily.     FORTEO 600 MCG/2.4ML Soln  Generic drug:  Teriparatide (Recombinant)  Inject 20 mcg into the skin daily.     furosemide 40 MG tablet  Commonly known as:  LASIX  Take 40 mg by mouth 2 (two) times daily.     HYDROmorphone 4 MG tablet  Commonly known as:  DILAUDID  Take 1 tablet (4 mg total) by mouth every 4 (four) hours as needed for severe pain.     lamoTRIgine 100 MG tablet  Commonly known as:  LAMICTAL  Take 100 mg by mouth at bedtime.     Loratadine 10 MG Caps  Take 1 capsule by mouth daily.     LYRICA 150 MG capsule  Generic drug:  pregabalin  Take 300 mg by mouth at bedtime.      meclizine 25 MG tablet  Commonly known as:  ANTIVERT  Take 25 mg by mouth at bedtime.     Melatonin 300 MCG Tabs  Take 1 tablet by mouth at bedtime as needed (for sleep).     multivitamin capsule  Take 1 capsule by mouth daily.     niacin 500 MG tablet  Take 500 mg by mouth at bedtime.     ondansetron 4 MG tablet  Commonly known as:  ZOFRAN  1 tablet every 8 (eight) hours as needed.     ondansetron 4 MG tablet  Commonly known as:  ZOFRAN  Take 1 tablet (4 mg total) by mouth every 8 (eight) hours as needed for nausea.     potassium gluconate 595 MG Tabs tablet  Take 595 mg by mouth 2 (two) times daily.     pramipexole 0.5 MG tablet  Commonly known as:  MIRAPEX  Take 0.5 mg by mouth at bedtime.     PRILOSEC 20 MG capsule  Generic drug:  omeprazole  Take 20 mg by mouth at bedtime.     RITALIN LA 40 MG 24 hr capsule  Generic drug:  methylphenidate  Take 80 mg by mouth daily.     SYSTANE 0.4-0.3 % Soln  Generic drug:  Polyethyl Glycol-Propyl Glycol  Place 1 drop into both eyes 3 (three) times daily.     VITAMIN C GUMMIE PO  Take 1 tablet by mouth daily.     VITAMIN D3 ADULT GUMMIES 1000 UNITS Chew  Generic drug:  Cholecalciferol  Chew 1,000 Units by mouth daily.     zonisamide 100 MG capsule  Commonly known as:  ZONEGRAN  Take 200 mg by mouth at bedtime.         SignedTressie Stalker D 02/17/2014, 11:28 AM

## 2014-02-18 ENCOUNTER — Encounter (HOSPITAL_COMMUNITY): Payer: Self-pay | Admitting: Neurosurgery

## 2014-08-05 ENCOUNTER — Other Ambulatory Visit: Payer: Self-pay

## 2014-12-03 ENCOUNTER — Encounter (HOSPITAL_BASED_OUTPATIENT_CLINIC_OR_DEPARTMENT_OTHER): Payer: Self-pay | Admitting: *Deleted

## 2014-12-03 ENCOUNTER — Encounter (HOSPITAL_BASED_OUTPATIENT_CLINIC_OR_DEPARTMENT_OTHER): Payer: Self-pay | Admitting: Anesthesiology

## 2014-12-03 ENCOUNTER — Encounter (HOSPITAL_BASED_OUTPATIENT_CLINIC_OR_DEPARTMENT_OTHER)
Admission: RE | Admit: 2014-12-03 | Discharge: 2014-12-03 | Disposition: A | Discharge status: Home / Self Care | Payer: Medicare Other | Source: Ambulatory Visit | Attending: Orthopedic Surgery | Admitting: Orthopedic Surgery

## 2014-12-03 ENCOUNTER — Other Ambulatory Visit: Payer: Self-pay | Admitting: Emergency Medicine

## 2014-12-03 ENCOUNTER — Other Ambulatory Visit: Payer: Self-pay | Admitting: Orthopedic Surgery

## 2014-12-03 DIAGNOSIS — Z01818 Encounter for other preprocedural examination: Secondary | ICD-10-CM | POA: Diagnosis present

## 2014-12-03 LAB — BASIC METABOLIC PANEL
ANION GAP: 8 (ref 5–15)
BUN: 17 mg/dL (ref 6–20)
CALCIUM: 8.8 mg/dL — AB (ref 8.9–10.3)
CO2: 32 mmol/L (ref 22–32)
Chloride: 100 mmol/L — ABNORMAL LOW (ref 101–111)
Creatinine, Ser: 0.89 mg/dL (ref 0.44–1.00)
GFR calc Af Amer: >60 mL/min (ref >60)
Glucose, Bld: 92 mg/dL (ref 65–99)
POTASSIUM: 3.8 mmol/L (ref 3.5–5.1)
SODIUM: 140 mmol/L (ref 135–145)

## 2014-12-04 ENCOUNTER — Other Ambulatory Visit: Payer: Self-pay | Admitting: Orthopedic Surgery

## 2014-12-04 ENCOUNTER — Ambulatory Visit (HOSPITAL_BASED_OUTPATIENT_CLINIC_OR_DEPARTMENT_OTHER): Admission: RE | Admit: 2014-12-04 | Payer: Medicare Other | Source: Ambulatory Visit | Admitting: Orthopedic Surgery

## 2014-12-04 SURGERY — ARTHROSCOPY, WRIST
Anesthesia: General | Site: Wrist | Laterality: Right

## 2015-02-09 HISTORY — PX: ROTATOR CUFF REPAIR: SHX139

## 2015-03-07 ENCOUNTER — Other Ambulatory Visit: Payer: Self-pay | Admitting: Orthopedic Surgery

## 2015-03-07 DIAGNOSIS — M25511 Pain in right shoulder: Secondary | ICD-10-CM

## 2015-03-20 ENCOUNTER — Ambulatory Visit
Admission: RE | Admit: 2015-03-20 | Discharge: 2015-03-20 | Disposition: A | Discharge status: Home / Self Care | Payer: Medicare Other | Source: Ambulatory Visit | Attending: Orthopedic Surgery | Admitting: Orthopedic Surgery

## 2015-03-20 DIAGNOSIS — M25511 Pain in right shoulder: Secondary | ICD-10-CM

## 2015-03-20 MED ORDER — IOHEXOL 180 MG/ML  SOLN: 15.0000 mL | Freq: Once | INTRAMUSCULAR | Status: DC | PRN

## 2015-04-11 ENCOUNTER — Encounter (HOSPITAL_BASED_OUTPATIENT_CLINIC_OR_DEPARTMENT_OTHER): Payer: Self-pay | Admitting: *Deleted

## 2015-04-11 ENCOUNTER — Emergency Department (HOSPITAL_BASED_OUTPATIENT_CLINIC_OR_DEPARTMENT_OTHER): Payer: Medicare Other

## 2015-04-11 ENCOUNTER — Emergency Department (HOSPITAL_BASED_OUTPATIENT_CLINIC_OR_DEPARTMENT_OTHER)
Admission: EM | Admit: 2015-04-11 | Discharge: 2015-04-11 | Disposition: A | Discharge status: Home / Self Care | Payer: Medicare Other | Attending: Physician Assistant | Admitting: Physician Assistant

## 2015-04-11 DIAGNOSIS — F419 Anxiety disorder, unspecified: Secondary | ICD-10-CM | POA: Diagnosis not present

## 2015-04-11 DIAGNOSIS — Z87891 Personal history of nicotine dependence: Secondary | ICD-10-CM | POA: Diagnosis not present

## 2015-04-11 DIAGNOSIS — K219 Gastro-esophageal reflux disease without esophagitis: Secondary | ICD-10-CM | POA: Diagnosis not present

## 2015-04-11 DIAGNOSIS — G43909 Migraine, unspecified, not intractable, without status migrainosus: Secondary | ICD-10-CM | POA: Diagnosis not present

## 2015-04-11 DIAGNOSIS — Z79899 Other long term (current) drug therapy: Secondary | ICD-10-CM | POA: Insufficient documentation

## 2015-04-11 DIAGNOSIS — I10 Essential (primary) hypertension: Secondary | ICD-10-CM | POA: Insufficient documentation

## 2015-04-11 DIAGNOSIS — E669 Obesity, unspecified: Secondary | ICD-10-CM | POA: Insufficient documentation

## 2015-04-11 DIAGNOSIS — Z7951 Long term (current) use of inhaled steroids: Secondary | ICD-10-CM | POA: Diagnosis not present

## 2015-04-11 DIAGNOSIS — R2242 Localized swelling, mass and lump, left lower limb: Secondary | ICD-10-CM | POA: Insufficient documentation

## 2015-04-11 DIAGNOSIS — M79605 Pain in left leg: Secondary | ICD-10-CM | POA: Diagnosis present

## 2015-04-11 DIAGNOSIS — M797 Fibromyalgia: Secondary | ICD-10-CM | POA: Insufficient documentation

## 2015-04-11 DIAGNOSIS — F319 Bipolar disorder, unspecified: Secondary | ICD-10-CM | POA: Diagnosis not present

## 2015-04-11 DIAGNOSIS — Z8701 Personal history of pneumonia (recurrent): Secondary | ICD-10-CM | POA: Diagnosis not present

## 2015-04-11 DIAGNOSIS — Z86718 Personal history of other venous thrombosis and embolism: Secondary | ICD-10-CM | POA: Insufficient documentation

## 2015-04-11 DIAGNOSIS — M7989 Other specified soft tissue disorders: Secondary | ICD-10-CM

## 2015-04-11 NOTE — Discharge Instructions (Signed)
Your DVT scan was negative. Please return with any concerns.    Edema Edema is an abnormal buildup of fluids in your bodytissues. Edema is somewhatdependent on gravity to pull the fluid to the lowest place in your body. That makes the condition more common in the legs and thighs (lower extremities). Painless swelling of the feet and ankles is common and becomes more likely as you get older. It is also common in looser tissues, like around your eyes.  When the affected area is squeezed, the fluid may move out of that spot and leave a dent for a few moments. This dent is called pitting.  CAUSES  There are many possible causes of edema. Eating too much salt and being on your feet or sitting for a long time can cause edema in your legs and ankles. Hot weather may make edema worse. Common medical causes of edema include:  Heart failure.  Liver disease.  Kidney disease.  Weak blood vessels in your legs.  Cancer.  An injury.  Pregnancy.  Some medications.  Obesity. SYMPTOMS  Edema is usually painless.Your skin may look swollen or shiny.  DIAGNOSIS  Your health care provider may be able to diagnose edema by asking about your medical history and doing a physical exam. You may need to have tests such as X-rays, an electrocardiogram, or blood tests to check for medical conditions that may cause edema.  TREATMENT  Edema treatment depends on the cause. If you have heart, liver, or kidney disease, you need the treatment appropriate for these conditions. General treatment may include:  Elevation of the affected body part above the level of your heart.  Compression of the affected body part. Pressure from elastic bandages or support stockings squeezes the tissues and forces fluid back into the blood vessels. This keeps fluid from entering the tissues.  Restriction of fluid and salt intake.  Use of a water pill (diuretic). These medications are appropriate only for some types of edema. They  pull fluid out of your body and make you urinate more often. This gets rid of fluid and reduces swelling, but diuretics can have side effects. Only use diuretics as directed by your health care provider. HOME CARE INSTRUCTIONS   Keep the affected body part above the level of your heart when you are lying down.   Do not sit still or stand for prolonged periods.   Do not put anything directly under your knees when lying down.  Do not wear constricting clothing or garters on your upper legs.   Exercise your legs to work the fluid back into your blood vessels. This may help the swelling go down.   Wear elastic bandages or support stockings to reduce ankle swelling as directed by your health care provider.   Eat a low-salt diet to reduce fluid if your health care provider recommends it.   Only take medicines as directed by your health care provider. SEEK MEDICAL CARE IF:   Your edema is not responding to treatment.  You have heart, liver, or kidney disease and notice symptoms of edema.  You have edema in your legs that does not improve after elevating them.   You have sudden and unexplained weight gain. SEEK IMMEDIATE MEDICAL CARE IF:   You develop shortness of breath or chest pain.   You cannot breathe when you lie down.  You develop pain, redness, or warmth in the swollen areas.   You have heart, liver, or kidney disease and suddenly get edema.  You have a fever and your symptoms suddenly get worse. MAKE SURE YOU:   Understand these instructions.  Will watch your condition.  Will get help right away if you are not doing well or get worse.   This information is not intended to replace advice given to you by your health care provider. Make sure you discuss any questions you have with your health care provider.   Document Released: 01/25/2005 Document Revised: 02/15/2014 Document Reviewed: 11/17/2012 Elsevier Interactive Patient Education [employer]

## 2015-04-11 NOTE — ED Notes (Signed)
Per pt report recent surgery on shoulder, noticed bumps to back of upper lt leg, increase swelling and pain started this morning. Pain radiated up to groin. Hx blood clots about four years ago.

## 2015-04-11 NOTE — ED Provider Notes (Signed)
CSN: [id number]     Arrival date & time 04/11/15  1114 History   First MD Initiated Contact with Patient 04/11/15 1131     Chief Complaint  Patient presents with  . Leg Pain  . Leg Swelling     (Consider location/radiation/quality/duration/timing/severity/associated sxs/prior Treatment) HPI   Patient 70 year old female with history of left leg DVT after surgery, bipolar disorder, febrile fibromyalgia, presenting today after recent surgery in her right shoulder with left leg swelling.  Patient had a surgery 2 weeks ago on right shoulder. Patient reports that she has a knot in her left upper leg. She notes mild swelling to the left lower leg.  No shortness of breath no fever no surrounding erythema.  Past Medical History  Diagnosis Date  . Cough   . Migraine   . Fibromyalgia   . Bipolar affective disorder (HCC)   . GERD (gastroesophageal reflux disease)   . Left leg DVT (HCC) 2010  . DJD (degenerative joint disease) of lumbar spine   . Family history of anesthesia complication     Hx: father has nausea  . Hypertension     Hx: of in 2011  . Pneumonia   . Vertigo     Hx: of  . DVT (deep venous thrombosis) (HCC) 2010    groin left - on coumadin for 6 months  . Vocal cord dysfunction     sees dr. Delton Coombes  . Shortness of breath     exertion  . Anxiety   . Depression   . Spinal headache   . PONV (postoperative nausea and vomiting)     and migraines   Past Surgical History  Procedure Laterality Date  . Cholecystectomy  1989  . Tonsillectomy  1955  . Partial hysterectomy  1980  . Breast enhancement surgery  1989  . Cervical fusion  2005  . Spinal cord stimulator implant  2010  . Carpal tunnel release  2006    Right hand  . Tarsal suspension      Hx; of left thumb  . Cataract extraction w/ intraocular lens  implant, bilateral      Hx: of  . Appendectomy    . Colonoscopy w/ biopsies and polypectomy      Hx: of  . Upper gi endoscopy      Hx: of  . Lumbar  laminectomy/decompression microdiscectomy Right 11/06/2012    Procedure: LUMBAR TWO THREE LUMBAR LAMINECTOMY/DECOMPRESSION MICRODISCECTOMY 1 LEVEL;  Surgeon: Temple Pacini, MD;  Location: [facility];  Service: Neurosurgery;  Laterality: Right;  . Fracture surgery Right 09/2013    ankle and leg - plate and screws  . Finger arthroscopy with carpometacarpel ([facility]) arthroplasty      thumb  . Anterior lat lumbar fusion Left 02/15/2014    Procedure: ANTERIOR LATERAL LUMBAR INTERBODY FUSION LUMBAR TWO-THREE,LUMBAR THREE-FOUR WITH LATERAL PLATE.;  Surgeon: Temple Pacini, MD;  Location: [facility];  Service: Neurosurgery;  Laterality: Left;  left   . Shoulder arthroscopy     Family History  Problem Relation Age of Onset  . Lung cancer Mother   . Prostate cancer Father   . Rheum arthritis Paternal Grandmother   . Asthma Sister   . Heart disease Father    Social History  Substance Use Topics  . Smoking status: Former Smoker -- 2.50 packs/day for 21 years    Types: Cigarettes    Quit date: 09/16/1986  . Smokeless tobacco: Never Used  . Alcohol Use: No  Comment: recovering alcoholic sober since 1989   OB History    No data available     Review of Systems  Constitutional: Negative for activity change.  HENT: Negative for congestion.   Respiratory: Negative for cough, chest tightness and shortness of breath.   Cardiovascular: Positive for leg swelling. Negative for chest pain and palpitations.  Gastrointestinal: Negative for abdominal pain.  Genitourinary: Negative for dysuria.  Neurological: Negative for dizziness.      Allergies  Lithium; Lisinopril; Scopolamine; Statins; and Adhesive  Home Medications   Prior to Admission medications   Medication Sig Start Date End Date Taking? Authorizing Provider  albuterol (PROVENTIL HFA;VENTOLIN HFA) 108 (90 BASE) MCG/ACT inhaler Inhale 2 puffs into the lungs every 6 (six) hours as needed for wheezing.   Yes Historical Provider, MD   ARIPiprazole (ABILIFY) 10 MG tablet Take 10 mg by mouth at bedtime.    Yes Historical Provider, MD  Ascorbic Acid (VITAMIN C GUMMIE PO) Take 1 tablet by mouth daily.   Yes Historical Provider, MD  butalbital-acetaminophen-caffeine (FIORICET, ESGIC) 50-325-40 MG per tablet Take 1 tablet by mouth 2 (two) times daily as needed for headache.    Yes Historical Provider, MD  Calcium-Phosphorus-Vitamin D (CALCIUM GUMMIES PO) Take 2 tablets by mouth daily.   Yes Historical Provider, MD  Cholecalciferol (VITAMIN D3 ADULT GUMMIES) 1000 UNITS CHEW Chew 1,000 Units by mouth daily.   Yes Historical Provider, MD  cyclobenzaprine (FLEXERIL) 10 MG tablet Take 20 mg by mouth at bedtime.    Yes Historical Provider, MD  cycloSPORINE (RESTASIS) 0.05 % ophthalmic emulsion Place 1 drop into both eyes 2 (two) times daily.   Yes Historical Provider, MD  desvenlafaxine (PRISTIQ) 100 MG 24 hr tablet Take 100 mg by mouth daily.   Yes Historical Provider, MD  DOCUSATE SODIUM PO Take 1 tablet by mouth 2 (two) times daily.    Yes Historical Provider, MD  esomeprazole (NEXIUM) 40 MG capsule Take 40 mg by mouth daily before breakfast.     Yes Historical Provider, MD  fluorometholone (FML) 0.1 % ophthalmic suspension Place 1 drop into both eyes 2 (two) times daily as needed (Gritty, sticky eyes).    Yes Historical Provider, MD  FLUoxetine (PROZAC) 40 MG capsule Take 40 mg by mouth daily.  11/05/13  Yes Historical Provider, MD  fluticasone (FLONASE) 50 MCG/ACT nasal spray Place 2 sprays into both nostrils 2 (two) times daily. 12/05/13  Yes Leslye Peerobert S Byrum, MD  furosemide (LASIX) 40 MG tablet Take 40 mg by mouth 2 (two) times daily.    Yes Historical Provider, MD  hydrochlorothiazide (MICROZIDE) 12.5 MG capsule Take 12.5 mg by mouth daily.   Yes Historical Provider, MD  HYDROmorphone (DILAUDID) 4 MG tablet Take 1 tablet (4 mg total) by mouth every 4 (four) hours as needed for severe pain. 02/17/14  Yes Tressie StalkerJeffrey Jenkins, MD  hydrOXYzine  (ATARAX/VISTARIL) 25 MG tablet Take 25 mg by mouth 3 (three) times daily as needed.   Yes Historical Provider, MD  lamoTRIgine (LAMICTAL) 100 MG tablet Take 100 mg by mouth at bedtime.     Yes Historical Provider, MD  lisdexamfetamine (VYVANSE) 70 MG capsule Take 70 mg by mouth daily.   Yes Historical Provider, MD  Loratadine 10 MG CAPS Take 1 capsule by mouth daily.     Yes Historical Provider, MD  lubiprostone (AMITIZA) 8 MCG capsule Take 8 mcg by mouth 2 (two) times daily with a meal.   Yes Historical Provider, MD  LYRICA 150  MG capsule Take 300 mg by mouth at bedtime.  12/04/13  Yes Historical Provider, MD  meclizine (ANTIVERT) 25 MG tablet Take 25 mg by mouth at bedtime.    Yes Historical Provider, MD  Melatonin 300 MCG TABS Take 1 tablet by mouth at bedtime as needed (for sleep).    Yes Historical Provider, MD  Multiple Vitamin (MULTIVITAMIN) capsule Take 1 capsule by mouth daily.     Yes Historical Provider, MD  omeprazole (PRILOSEC) 20 MG capsule Take 20 mg by mouth at bedtime.     Yes Historical Provider, MD  ondansetron (ZOFRAN) 4 MG tablet 1 tablet every 8 (eight) hours as needed. 05/16/13  Yes Historical Provider, MD  potassium gluconate 595 MG TABS Take 595 mg by mouth 2 (two) times daily.    Yes Historical Provider, MD  pramipexole (MIRAPEX) 0.5 MG tablet Take 0.5 mg by mouth at bedtime.     Yes Historical Provider, MD  Teriparatide, Recombinant, (FORTEO) 600 MCG/2.4ML SOLN Inject 20 mcg into the skin daily.   Yes Historical Provider, MD   BP 104/78 mmHg  Pulse 72  Temp(Src) 98.1 F (36.7 C) (Oral)  Resp 16  SpO2 97% Physical Exam  Constitutional: She is oriented to person, place, and time. She appears well-developed and well-nourished.  Mildly obese 70 year old female with blue hair.  HENT:  Head: Normocephalic and atraumatic.  Eyes: Conjunctivae are normal. Right eye exhibits no discharge.  Neck: Neck supple.  Cardiovascular: Normal rate, regular rhythm and normal heart  sounds.   No murmur heard. Pulmonary/Chest: Effort normal and breath sounds normal. She has no wheezes. She has no rales.  Abdominal: Soft. She exhibits no distension. There is no tenderness.  Musculoskeletal: Normal range of motion. She exhibits no edema.  Right arm in splint.  Bilateral legs with positive pulses, no edema noted. Could not feel the lump the patient was referring to.  Neurological: She is oriented to person, place, and time.  Skin: Skin is warm and dry. No rash noted. She is not diaphoretic.  Psychiatric: She has a normal mood and affect.  Nursing note and vitals reviewed.   ED Course  Procedures (including critical care time) Labs Review Labs Reviewed - No data to display  Imaging Review US Venous Img Lower Unilateral Left  04/11/2015  CLINICAL DATA:  Left posterior knee pain today EXAM: LEFT LOWER EXTREMITY VENOUS DUPLEX ULTRASOUND TECHNIQUE: Doppler venous assessment of the left lower extremity deep venous system was performed, including characterization of spectral flow, compressibility, and phasicity. COMPARISON:  None. FINDINGS: There is complete compressibility of the left common femoral, femoral, and popliteal veins. Doppler analysis demonstrates respiratory phasicity and augmentation of flow with calf compression. IMPRESSION: No evidence of left lower extremity DVT. Electronically Signed   By: Jolaine Click M.D.   On: 04/11/2015 12:56   I have personally reviewed and evaluated these images and lab results as part of my medical decision-making.   EKG Interpretation None      MDM   Final diagnoses:  Left leg swelling    Patient is a 70 year old female with past medical history of provoked DVT after surgery. She is presenting today with left lower leg swelling, and feeling of a knot in her left upper leg. No shortness of breath. Patient on any anticoagulation. We'll get DVT study done today.  2:00 PM DVT study neg, will have her follow up with  PCP.  Bobby Barton Randall An, MD 04/11/15 1400

## 2015-04-11 NOTE — ED Notes (Signed)
Patient transported to Ultrasound 

## 2015-05-14 ENCOUNTER — Other Ambulatory Visit: Payer: Self-pay | Admitting: Emergency Medicine

## 2015-10-07 ENCOUNTER — Encounter: Payer: Self-pay | Admitting: Emergency Medicine

## 2015-10-07 ENCOUNTER — Ambulatory Visit (INDEPENDENT_AMBULATORY_CARE_PROVIDER_SITE_OTHER): Payer: Medicare Other | Admitting: Emergency Medicine

## 2015-10-07 DIAGNOSIS — R911 Solitary pulmonary nodule: Secondary | ICD-10-CM

## 2015-10-07 DIAGNOSIS — J383 Other diseases of vocal cords: Secondary | ICD-10-CM | POA: Diagnosis not present

## 2015-10-07 DIAGNOSIS — K219 Gastro-esophageal reflux disease without esophagitis: Secondary | ICD-10-CM | POA: Diagnosis not present

## 2015-10-07 NOTE — Assessment & Plan Note (Signed)
We will repeat her CT scan of the chest to look for interval change

## 2015-10-07 NOTE — Assessment & Plan Note (Signed)
More labile since this spring. I suspect that her weight gain is playing a role here with worsening control of her GERD.

## 2015-10-07 NOTE — Assessment & Plan Note (Signed)
With significant breakthrough symptoms. Likely related to her weight gain. We will continue same GERD regimen, she will work on weight loss.

## 2015-10-07 NOTE — Assessment & Plan Note (Signed)
Marginally well-controlled continue same regimen

## 2015-10-07 NOTE — Patient Instructions (Addendum)
Take albuterol 2 puffs up to every 4 hours if needed for shortness of breath.  Continue your other medications as you have been taking them  Work hard on exercise and weight loss.  We will perform a CT chest to compare with your prior.  Follow with Dr Delton CoombesByrum in next available after your scan to review the results.

## 2015-10-07 NOTE — Progress Notes (Signed)
70 yo former smoker, hx documented allergies and rhinitis, chronic cough. Has seen Dr Corinda Gubler regarding allergies and the cough. For the last month the cough has been much worse, happens with talking and exertion. She has been empirically treated with by mouth steroids and also started on inhaled LABA/ICS. Skin testing has shown no sensitivity to cats/dogs although she has 30 animals in her home. Spirometry done 07/03/09 showed normal airflows. She has also been treated for GERD with Nexium + prilosec.   ROV 11/04/09 -- returns for cough. Her HSP panel is negative for fungal sensitivity, IgE and eosinophils normal. Her cough persists, may be a bit better. Able to carry on more conversation, talking does make it worse. Dr Maple Hudson rightly raises possibility of auto-immune process, even BAC.   ROV 12/03/09 -- chronic cough, chronic rhinitis, normal AF on spirometry. She was admitted 10/9-10/12 to [facility] with fever, lightheadedness, dyspnea, cough was actually better. Treated for ? COPD exac or PNA. CT scan without infiltrates (per d/c summary). Cleda Daub 5/11 normal. She believes that the [facility] helps her. Her cough is better. 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   ROV 12/25/09 -- chronic cough, rhinitis, abnormal CT scan (? mild nodular infiltrate, 09/2009). Repeat scan done at [facility] in 10/11, need to review and compare. Initial plan has been to repeat scan in Dec. Has had PFT since last visit, here to review. 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 Her exposure profile at home is very high - dusts, litter etc. She is on [place], sometimes coughs after it. She believes that the [facility] is helping her breathing.   ROV 05/22/10 -- returns for eval severe allergies, POSSIBLE AFL, on Dulera + flonase, antivert, loratadine, omeprazole, cough suppressants, nasal saline washes. Her cough has been worse over the last week, 1 week ago she had fever, chills.  Some hoarse voice.  ROV 06/19/12 -- returns for eval severe allergies, POSSIBLE AFL. She returns reporting that her cough has improved. She is using loratadine [place] and fluticasone nasal spray. She restarted her dulera without too much problem. She uses proair prn, about 2x a month. She has had a lot of problem w thrush after taking abx., ? dulera part of the problem. She is overdue to have repeat CT scan chest for RML nodule.   ROV 08/04/12 -- cough and rhinitis w allergies, dyspnea with possible AFL.  Her cough is somewhat better. She still has SOB with exertion, had an episode of classic UA closure in the shower recently. She relaxed and it resolved.   ROV 12/12/12 -- cough and rhinitis w allergies, dyspnea with possible AFL. Since last time she has been able to marry her partner of over 30 years!! She has lost 40 lbs!! She is having some deep cough, remains on her good allergy regimen. Still feels a tickle in her throat.  She still gets episodes of VC spasm.  Still has lots of animal exposure at home.   ROV 05/30/13 -- cough and rhinitis w allergies, dyspnea with possible AFL. She continues to have coughing spells, happen 2-3x a week. Still with exertional dyspnea. On fluticasone, nexium, pepcid, nasal saline.  Albuterol prn > uses few times a week. She is having R hand surgery next week.   ROV 12/05/13 -- cough and rhinitis w allergies, dyspnea with possible AFL. She reports that she suffered a fall, R leg fx, R ankle wound. She is  having some hoarseness and cough since she injured the foot. She has been on long term abx for her R ankle wound. She is doing [place] but not every day, she stopped fluticasone nasal spray due to nasal irritation. She remains loratadine.   ROV 10/07/15 -- This is a follow-up visit for patient with severe rhinitis, chronic cough and upper airway irritation syndrome. She also has possible obstruction on spirometry. She reports that she had been doing well until this Spring when she  developed some increased exertional dyspnea with previously easy tasks. Her cough had been fairly well controlled since last visit as well. She has gained about 50 lbs since last time, knows that this is a contributor. Her GERD is only marginally controlled. Still has a high pet population at home. She is using albuterol about 2x a day. She has a hx RUL nodular disease by CT chest 2011.     Vitals:   10/07/15 1003 10/07/15 1004  BP:  140/90  BP Location:  Left Arm  Cuff Size:  Normal  Pulse:  85  SpO2:  93%  Weight: 228 lb (103.4 kg)   Height: 5\' 3"  (1.6 m)    Gen: Pleasant, well-nourished, in no distress,  normal affect  ENT: No lesions,  mouth clear,  oropharynx clear, no postnasal drip  Neck: No JVD, no TMG, no carotid bruits  Lungs: No use of accessory muscles, no dullness to percussion, clear without rales or rhonchi  Cardiovascular: RRR, heart sounds normal, no murmur or gallops, no peripheral edema  Musculoskeletal: No deformities, no cyanosis or clubbing  Neuro: alert, non focal  Skin: Warm, no lesions or rashes   07/05/12 --  Comparison: 10/06/2009 chest CT  Findings: Cervical fusion hardware partly visualized. Great  vessels are normal in caliber. Trace fluid within the superior  pericardial recess is noted. Heart size is normal. No  lymphadenopathy.  Incomplete imaging of the upper abdomen demonstrates metallic  hardware from spinal stimulator in the right flank subcutaneous  tissues producing streak artifact. Cholecystectomy clips noted.  Cysts are again noted at the dome of the liver.  Right middle lobe subpleural 6 mm nodule image 29 demonstrate  ground-glass opacity and decreased density since previously. No new  pulmonary mass, 3 mm left lower lobe pulmonary nodule image 43 is  stable. No new pulmonary mass, nodule, or consolidation. Central  airways are patent.  No acute osseous finding.  IMPRESSION:  Stable pulmonary nodules as above since previous  exam 2011. This  is compatible with probable benignity and does not warrant further  specific imaging follow-up according to consensus guidelines.  No new acute finding.   CPST 07/18/12 --  Conclusion: Exercise testing with gas exchange demonstrates a normal functional capacity when compared to matched sedentary norms. There is a relative hyperventilation near peak exercise (relatively steep RCP), when VE/VCO2 is corrected to the RCP the slope remains significantly elevated at 37. The elevated VE/VCO2 slope and low HR response are concerning for an underlying circulatory limitation as primary problem. The most common type in obese individuals is diastolic dysfunction. In addition, Air flow limitation and obesity could be contributing.   Pulmonary nodule, right We will repeat her CT scan of the chest to look for interval change  Allergic rhinitis Marginally well-controlled continue same regimen  G E R D With significant breakthrough symptoms. Likely related to her weight gain. We will continue same GERD regimen, she will work on weight loss.  Vocal cord dysfunction More labile since  this spring. I suspect that her weight gain is playing a role here with worsening control of her GERD.  Levy Pupaobert Taaj Hurlbut, MD, PhD 10/07/2015, 10:23 AM [facility] [phone] or if no answer [phone]

## 2015-10-16 ENCOUNTER — Ambulatory Visit (INDEPENDENT_AMBULATORY_CARE_PROVIDER_SITE_OTHER)
Admission: RE | Admit: 2015-10-16 | Discharge: 2015-10-16 | Disposition: A | Discharge status: Home / Self Care | Payer: Medicare Other | Source: Ambulatory Visit | Attending: Emergency Medicine | Admitting: Emergency Medicine

## 2015-10-16 DIAGNOSIS — R911 Solitary pulmonary nodule: Secondary | ICD-10-CM | POA: Diagnosis not present

## 2015-11-04 ENCOUNTER — Telehealth: Payer: Self-pay | Admitting: Emergency Medicine

## 2015-11-04 NOTE — Telephone Encounter (Signed)
Pt calling again.[name] ° °

## 2015-11-04 NOTE — Telephone Encounter (Signed)
Spoke with pt and gave CT results. Nothing further needed.  

## 2015-11-14 ENCOUNTER — Other Ambulatory Visit (HOSPITAL_COMMUNITY): Payer: Self-pay | Admitting: Orthopedic Surgery

## 2015-11-14 ENCOUNTER — Ambulatory Visit (HOSPITAL_COMMUNITY)
Admission: RE | Admit: 2015-11-14 | Discharge: 2015-11-14 | Disposition: A | Discharge status: Home / Self Care | Payer: Medicare Other | Source: Ambulatory Visit | Attending: Orthopedic Surgery | Admitting: Orthopedic Surgery

## 2015-11-14 ENCOUNTER — Encounter (HOSPITAL_COMMUNITY): Payer: Medicare Other

## 2015-11-14 DIAGNOSIS — M7989 Other specified soft tissue disorders: Principal | ICD-10-CM

## 2015-11-14 DIAGNOSIS — Z86718 Personal history of other venous thrombosis and embolism: Secondary | ICD-10-CM | POA: Diagnosis not present

## 2015-11-14 DIAGNOSIS — M79604 Pain in right leg: Secondary | ICD-10-CM | POA: Insufficient documentation

## 2015-11-14 DIAGNOSIS — M79661 Pain in right lower leg: Secondary | ICD-10-CM | POA: Diagnosis not present

## 2015-11-21 ENCOUNTER — Encounter: Payer: Self-pay | Admitting: Emergency Medicine

## 2015-11-21 ENCOUNTER — Ambulatory Visit (INDEPENDENT_AMBULATORY_CARE_PROVIDER_SITE_OTHER): Payer: Medicare Other | Admitting: Emergency Medicine

## 2015-11-21 VITALS — BP 114/76 | HR 70 | Ht 63.0 in | Wt 240.4 lb

## 2015-11-21 DIAGNOSIS — J383 Other diseases of vocal cords: Secondary | ICD-10-CM

## 2015-11-21 DIAGNOSIS — J301 Allergic rhinitis due to pollen: Secondary | ICD-10-CM

## 2015-11-21 DIAGNOSIS — R911 Solitary pulmonary nodule: Secondary | ICD-10-CM

## 2015-11-21 DIAGNOSIS — R0602 Shortness of breath: Secondary | ICD-10-CM

## 2015-11-21 DIAGNOSIS — R0600 Dyspnea, unspecified: Secondary | ICD-10-CM | POA: Insufficient documentation

## 2015-11-21 DIAGNOSIS — Z23 Encounter for immunization: Secondary | ICD-10-CM

## 2015-11-21 DIAGNOSIS — K219 Gastro-esophageal reflux disease without esophagitis: Secondary | ICD-10-CM | POA: Diagnosis not present

## 2015-11-21 NOTE — Patient Instructions (Addendum)
We need to work on slowly and steadily building back up your conditioning and weight loss.  Please keep albuterol available to use 2 puffs as needed.  Continue your flonase, prilosec, nexium.  Follow with Dr Delton CoombesByrum in 6 months or sooner if you have any  problems

## 2015-11-21 NOTE — Progress Notes (Signed)
70 yo former smoker, hx documented allergies and rhinitis, chronic cough. Has seen Dr Corinda Gubler regarding allergies and the cough. For the last month the cough has been much worse, happens with talking and exertion. She has been empirically treated with by mouth steroids and also started on inhaled LABA/ICS. Skin testing has shown no sensitivity to cats/dogs although she has 30 animals in her home. Spirometry done 07/03/09 showed normal airflows. She has also been treated for GERD with Nexium + prilosec.   ROV 11/04/09 -- returns for cough. Her HSP panel is negative for fungal sensitivity, IgE and eosinophils normal. Her cough persists, may be a bit better. Able to carry on more conversation, talking does make it worse. Dr Maple Hudson rightly raises possibility of auto-immune process, even BAC.   ROV 12/03/09 -- chronic cough, chronic rhinitis, normal AF on spirometry. She was admitted 10/9-10/12 to [facility] with fever, lightheadedness, dyspnea, cough was actually better. Treated for ? COPD exac or PNA. CT scan without infiltrates (per d/c summary). Cleda Daub 5/11 normal. She believes that the [facility] helps her. Her cough is better. 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   ROV 12/25/09 -- chronic cough, rhinitis, abnormal CT scan (? mild nodular infiltrate, 09/2009). Repeat scan done at [facility] in 10/11, need to review and compare. Initial plan has been to repeat scan in Dec. Has had PFT since last visit, here to review. 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 Her exposure profile at home is very high - dusts, litter etc. She is on [place], sometimes coughs after it. She believes that the [facility] is helping her breathing.   ROV 05/22/10 -- returns for eval severe allergies, POSSIBLE AFL, on Dulera + flonase, antivert, loratadine, omeprazole, cough suppressants, nasal saline washes. Her cough has been worse over the last week, 1 week ago she had fever, chills.  Some hoarse voice.  ROV 06/19/12 -- returns for eval severe allergies, POSSIBLE AFL. She returns reporting that her cough has improved. She is using loratadine [place] and fluticasone nasal spray. She restarted her dulera without too much problem. She uses proair prn, about 2x a month. She has had a lot of problem w thrush after taking abx., ? dulera part of the problem. She is overdue to have repeat CT scan chest for RML nodule.   ROV 08/04/12 -- cough and rhinitis w allergies, dyspnea with possible AFL.  Her cough is somewhat better. She still has SOB with exertion, had an episode of classic UA closure in the shower recently. She relaxed and it resolved.   ROV 12/12/12 -- cough and rhinitis w allergies, dyspnea with possible AFL. Since last time she has been able to marry her partner of over 30 years!! She has lost 40 lbs!! She is having some deep cough, remains on her good allergy regimen. Still feels a tickle in her throat.  She still gets episodes of VC spasm.  Still has lots of animal exposure at home.   ROV 05/30/13 -- cough and rhinitis w allergies, dyspnea with possible AFL. She continues to have coughing spells, happen 2-3x a week. Still with exertional dyspnea. On fluticasone, nexium, pepcid, nasal saline.  Albuterol prn > uses few times a week. She is having R hand surgery next week.   ROV 12/05/13 -- cough and rhinitis w allergies, dyspnea with possible AFL. She reports that she suffered a fall, R leg fx, R ankle wound. She is  having some hoarseness and cough since she injured the foot. She has been on long term abx for her R ankle wound. She is doing [place] but not every day, she stopped fluticasone nasal spray due to nasal irritation. She remains loratadine.   ROV 10/07/15 -- This is a follow-up visit for patient with severe rhinitis, chronic cough and upper airway irritation syndrome. She also has possible obstruction on spirometry. She reports that she had been doing well until this Spring when she  developed some increased exertional dyspnea with previously easy tasks. Her cough had been fairly well controlled since last visit as well. She has gained about 50 lbs since last time, knows that this is a contributor. Her GERD is only marginally controlled. Still has a high pet population at home. She is using albuterol about 2x a day. She has a hx RUL nodular disease by CT chest 2011.   ROV 11/21/15 -- this is a follow-up visit for chronic cough and upper airway irritation syndrome, severe rhinitis, possible obstructive lung disease.Marland Kitchen He has associated GERD and allergic rhinitis. She also has a history of a pulmonary nodule. This recent CT scan of the chest was done on 10/16/15. The right middle lobe nodule is stable compared with scans going all the way back to 2011. There is a chronically enlarged right hilar lymph node 1.2 cm that is stable.   She tells me that she had a fall about 1 week ago, injured her R LE. She had a doppler of that leg this week - no DVT. She is having cough that comes in paroxyms, especially with talking or laughing. Often productive of green. She is still exposed to animals at home. She remains on nasal steroid, prilosec at bedtime, nexium qam. She is using albuterol, every third day. She has significant dyspnea with any exertion, even walking across the room.     Vitals:   11/21/15 1533  BP: 114/76  BP Location: Left Arm  Patient Position: Sitting  Cuff Size: Normal  Pulse: 70  SpO2: 95%  Weight: 240 lb 6.4 oz (109 kg)  Height: 5\' 3"  (1.6 m)   Gen: Pleasant, well-nourished, in no distress,  normal affect  ENT: No lesions,  mouth clear,  oropharynx clear, no postnasal drip  Neck: No JVD, no TMG, no carotid bruits  Lungs: No use of accessory muscles, no dullness to percussion, clear without rales or rhonchi  Cardiovascular: RRR, heart sounds normal, no murmur or gallops, no peripheral edema  Musculoskeletal: No deformities, no cyanosis or clubbing  Neuro: alert,  non focal  Skin: Warm, no lesions or rashes   07/05/12 --  Comparison: 10/06/2009 chest CT  Findings: Cervical fusion hardware partly visualized. Great  vessels are normal in caliber. Trace fluid within the superior  pericardial recess is noted. Heart size is normal. No  lymphadenopathy.  Incomplete imaging of the upper abdomen demonstrates metallic  hardware from spinal stimulator in the right flank subcutaneous  tissues producing streak artifact. Cholecystectomy clips noted.  Cysts are again noted at the dome of the liver.  Right middle lobe subpleural 6 mm nodule image 29 demonstrate  ground-glass opacity and decreased density since previously. No new  pulmonary mass, 3 mm left lower lobe pulmonary nodule image 43 is  stable. No new pulmonary mass, nodule, or consolidation. Central  airways are patent.  No acute osseous finding.  IMPRESSION:  Stable pulmonary nodules as above since previous exam 2011. This  is compatible with probable benignity and does  not warrant further  specific imaging follow-up according to consensus guidelines.  No new acute finding.   CPST 07/18/12 --  Conclusion: Exercise testing with gas exchange demonstrates a normal functional capacity when compared to matched sedentary norms. There is a relative hyperventilation near peak exercise (relatively steep RCP), when VE/VCO2 is corrected to the RCP the slope remains significantly elevated at 37. The elevated VE/VCO2 slope and low HR response are concerning for an underlying circulatory limitation as primary problem. The most common type in obese individuals is diastolic dysfunction. In addition, Air flow limitation and obesity could be contributing.   G E R D Continue nexium and prilosec  Allergic rhinitis flonase qd  Pulmonary nodule, right Benign based on CT's. Should not need any further scans  Vocal cord dysfunction Continue rx for her GERD and allergies  Dyspnea Progressive over the  last year. She has gained 50 lbs. Discussed with her that this is likely restrictive, that her CT scan does not show ILD or other reason for restriction. Discussed wt loss and exercise with her.   Levy Pupaobert Grisel Blumenstock, MD, PhD 11/21/2015, 4:04 PM [facility] [phone] or if no answer [phone]

## 2015-11-21 NOTE — Assessment & Plan Note (Signed)
Continue nexium and prilosec

## 2015-11-21 NOTE — Addendum Note (Signed)
Addended by: Garfield CorneaMABRY, Oberia Beaudoin L on: 11/21/2015 04:11 PM   Modules accepted: Orders

## 2015-11-21 NOTE — Assessment & Plan Note (Signed)
flonase qd 

## 2015-11-21 NOTE — Assessment & Plan Note (Signed)
Continue rx for her GERD and allergies

## 2015-11-21 NOTE — Assessment & Plan Note (Signed)
Progressive over the last year. She has gained 50 lbs. Discussed with her that this is likely restrictive, that her CT scan does not show ILD or other reason for restriction. Discussed wt loss and exercise with her.

## 2015-11-21 NOTE — Assessment & Plan Note (Signed)
Benign based on CT's. Should not need any further scans

## 2016-02-09 HISTORY — PX: SPINAL FUSION: SHX223

## 2016-05-03 ENCOUNTER — Other Ambulatory Visit: Payer: Self-pay | Admitting: Neurosurgery

## 2016-05-03 DIAGNOSIS — M47812 Spondylosis without myelopathy or radiculopathy, cervical region: Secondary | ICD-10-CM

## 2016-05-04 ENCOUNTER — Ambulatory Visit
Admission: RE | Admit: 2016-05-04 | Discharge: 2016-05-04 | Disposition: A | Discharge status: Home / Self Care | Payer: Medicare Other | Source: Ambulatory Visit | Attending: Neurosurgery | Admitting: Neurosurgery

## 2016-05-04 DIAGNOSIS — M47812 Spondylosis without myelopathy or radiculopathy, cervical region: Secondary | ICD-10-CM

## 2016-05-06 ENCOUNTER — Other Ambulatory Visit: Payer: Self-pay | Admitting: Neurosurgery

## 2016-05-24 ENCOUNTER — Encounter (HOSPITAL_COMMUNITY): Payer: Self-pay

## 2016-05-24 ENCOUNTER — Encounter (HOSPITAL_COMMUNITY)
Admission: RE | Admit: 2016-05-24 | Discharge: 2016-05-24 | Disposition: A | Discharge status: Home / Self Care | Payer: Medicare Other | Source: Ambulatory Visit | Attending: Neurosurgery | Admitting: Neurosurgery

## 2016-05-24 DIAGNOSIS — Z01810 Encounter for preprocedural cardiovascular examination: Secondary | ICD-10-CM | POA: Insufficient documentation

## 2016-05-24 DIAGNOSIS — M48062 Spinal stenosis, lumbar region with neurogenic claudication: Secondary | ICD-10-CM | POA: Diagnosis not present

## 2016-05-24 DIAGNOSIS — Z01812 Encounter for preprocedural laboratory examination: Secondary | ICD-10-CM | POA: Diagnosis not present

## 2016-05-24 DIAGNOSIS — I4510 Unspecified right bundle-branch block: Secondary | ICD-10-CM | POA: Insufficient documentation

## 2016-05-24 DIAGNOSIS — I10 Essential (primary) hypertension: Secondary | ICD-10-CM | POA: Insufficient documentation

## 2016-05-24 DIAGNOSIS — M5126 Other intervertebral disc displacement, lumbar region: Secondary | ICD-10-CM | POA: Diagnosis not present

## 2016-05-24 HISTORY — DX: Unspecified asthma, uncomplicated: J45.909

## 2016-05-24 HISTORY — DX: Other mechanical complication of implanted electronic neurostimulator of spinal cord electrode (lead), initial encounter: T85.192A

## 2016-05-24 LAB — CBC WITH DIFFERENTIAL/PLATELET
BASOS PCT: 0 %
Basophils Absolute: 0 10*3/uL (ref 0.0–0.1)
EOS PCT: 2 %
Eosinophils Absolute: 0.2 10*3/uL (ref 0.0–0.7)
HCT: 38.3 % (ref 36.0–46.0)
Hemoglobin: 12.4 g/dL (ref 12.0–15.0)
LYMPHS ABS: 2.1 10*3/uL (ref 0.7–4.0)
Lymphocytes Relative: 19 %
MCH: 25.4 pg — AB (ref 26.0–34.0)
MCHC: 32.4 g/dL (ref 30.0–36.0)
MCV: 78.5 fL (ref 78.0–100.0)
Monocytes Absolute: 1 10*3/uL (ref 0.1–1.0)
Monocytes Relative: 9 %
Neutro Abs: 7.7 10*3/uL (ref 1.7–7.7)
Neutrophils Relative %: 70 %
Platelets: 289 10*3/uL (ref 150–400)
RBC: 4.88 MIL/uL (ref 3.87–5.11)
RDW: 18.1 % — ABNORMAL HIGH (ref 11.5–15.5)
WBC: 11 10*3/uL — AB (ref 4.0–10.5)

## 2016-05-24 LAB — TYPE AND SCREEN
ABO/RH(D): O POS
ANTIBODY SCREEN: NEGATIVE

## 2016-05-24 LAB — BASIC METABOLIC PANEL
ANION GAP: 15 (ref 5–15)
BUN: 18 mg/dL (ref 6–20)
CHLORIDE: 98 mmol/L — AB (ref 101–111)
CO2: 29 mmol/L (ref 22–32)
Calcium: 9.5 mg/dL (ref 8.9–10.3)
Creatinine, Ser: 0.91 mg/dL (ref 0.44–1.00)
GFR calc Af Amer: >60 mL/min (ref >60)
GLUCOSE: 90 mg/dL (ref 65–99)
POTASSIUM: 3.2 mmol/L — AB (ref 3.5–5.1)
Sodium: 142 mmol/L (ref 135–145)

## 2016-05-24 LAB — SURGICAL PCR SCREEN
MRSA, PCR: NEGATIVE
Staphylococcus aureus: NEGATIVE

## 2016-05-24 MED ORDER — CHLORHEXIDINE GLUCONATE CLOTH 2 % EX PADS: 6.0000 | MEDICATED_PAD | Freq: Once | CUTANEOUS | Status: DC

## 2016-05-24 NOTE — Pre-Procedure Instructions (Signed)
    [name]  05/24/2016      [facility] - [place], [place] - [address] [place] [address] Phone: [phone] Fax: [phone]  [id number] - [place], [place] - [address] [place] [address] Phone: [phone] Fax: [phone]    Your procedure is scheduled on 05/31/16.  Report to [facility] Admitting at 1030 A.M.  Call this number if you have problems the morning of surgery:  [phone]   Remember:  Do not eat food or drink liquids after midnight.  Take these medicines the morning of surgery with A SIP OF WATER --all inhalers,xanax,prozac,dilaudid,prilosec   Do not wear jewelry, make-up or nail polish.  Do not wear lotions, powders, or perfumes, or deoderant.  Do not shave 48 hours prior to surgery.  Men may shave face and neck.  Do not bring valuables to the hospital.  [facility] is not responsible for any belongings or valuables.  Contacts, dentures or bridgework may not be worn into surgery.  Leave your suitcase in the car.  After surgery it may be brought to your room.  For patients admitted to the hospital, discharge time will be determined by your treatment team.  Patients discharged the day of surgery will not be allowed to drive home.   Name and phone number of your driver:    Special instructions:  Do not take any aspirin,anti-inflammatories,vitamins,or herbal supplements 5-7 days prior to surgery.  Please read over the following fact sheets that you were given. MRSA Information

## 2016-05-31 ENCOUNTER — Inpatient Hospital Stay (HOSPITAL_COMMUNITY)
Admission: RE | Admit: 2016-05-31 | Discharge: 2016-06-02 | DRG: 454 | Disposition: A | Discharge status: Home / Self Care | Payer: Medicare Other | Source: Ambulatory Visit | Attending: Neurosurgery | Admitting: Neurosurgery

## 2016-05-31 ENCOUNTER — Inpatient Hospital Stay (HOSPITAL_COMMUNITY): Payer: Medicare Other | Admitting: Emergency Medicine

## 2016-05-31 ENCOUNTER — Inpatient Hospital Stay (HOSPITAL_COMMUNITY): Payer: Medicare Other | Admitting: Certified Registered"

## 2016-05-31 ENCOUNTER — Encounter (HOSPITAL_COMMUNITY)
Admission: RE | Disposition: A | Discharge status: Home / Self Care | Payer: Self-pay | Source: Ambulatory Visit | Attending: Neurosurgery

## 2016-05-31 ENCOUNTER — Encounter (HOSPITAL_COMMUNITY): Payer: Self-pay | Admitting: *Deleted

## 2016-05-31 ENCOUNTER — Inpatient Hospital Stay (HOSPITAL_COMMUNITY): Payer: Medicare Other

## 2016-05-31 DIAGNOSIS — J45909 Unspecified asthma, uncomplicated: Secondary | ICD-10-CM | POA: Diagnosis present

## 2016-05-31 DIAGNOSIS — Z801 Family history of malignant neoplasm of trachea, bronchus and lung: Secondary | ICD-10-CM | POA: Diagnosis not present

## 2016-05-31 DIAGNOSIS — Z419 Encounter for procedure for purposes other than remedying health state, unspecified: Secondary | ICD-10-CM

## 2016-05-31 DIAGNOSIS — Z825 Family history of asthma and other chronic lower respiratory diseases: Secondary | ICD-10-CM | POA: Diagnosis not present

## 2016-05-31 DIAGNOSIS — Z9842 Cataract extraction status, left eye: Secondary | ICD-10-CM | POA: Diagnosis not present

## 2016-05-31 DIAGNOSIS — Z8249 Family history of ischemic heart disease and other diseases of the circulatory system: Secondary | ICD-10-CM | POA: Diagnosis not present

## 2016-05-31 DIAGNOSIS — Z79899 Other long term (current) drug therapy: Secondary | ICD-10-CM | POA: Diagnosis not present

## 2016-05-31 DIAGNOSIS — Z885 Allergy status to narcotic agent status: Secondary | ICD-10-CM | POA: Diagnosis not present

## 2016-05-31 DIAGNOSIS — M5126 Other intervertebral disc displacement, lumbar region: Secondary | ICD-10-CM | POA: Diagnosis present

## 2016-05-31 DIAGNOSIS — Z79891 Long term (current) use of opiate analgesic: Secondary | ICD-10-CM | POA: Diagnosis not present

## 2016-05-31 DIAGNOSIS — Z8042 Family history of malignant neoplasm of prostate: Secondary | ICD-10-CM | POA: Diagnosis not present

## 2016-05-31 DIAGNOSIS — I10 Essential (primary) hypertension: Secondary | ICD-10-CM | POA: Diagnosis present

## 2016-05-31 DIAGNOSIS — F419 Anxiety disorder, unspecified: Secondary | ICD-10-CM | POA: Diagnosis present

## 2016-05-31 DIAGNOSIS — Z8261 Family history of arthritis: Secondary | ICD-10-CM | POA: Diagnosis not present

## 2016-05-31 DIAGNOSIS — Z9049 Acquired absence of other specified parts of digestive tract: Secondary | ICD-10-CM | POA: Diagnosis not present

## 2016-05-31 DIAGNOSIS — Z87891 Personal history of nicotine dependence: Secondary | ICD-10-CM

## 2016-05-31 DIAGNOSIS — Z961 Presence of intraocular lens: Secondary | ICD-10-CM | POA: Diagnosis present

## 2016-05-31 DIAGNOSIS — Z9841 Cataract extraction status, right eye: Secondary | ICD-10-CM | POA: Diagnosis not present

## 2016-05-31 DIAGNOSIS — Z981 Arthrodesis status: Secondary | ICD-10-CM

## 2016-05-31 DIAGNOSIS — K219 Gastro-esophageal reflux disease without esophagitis: Secondary | ICD-10-CM | POA: Diagnosis present

## 2016-05-31 DIAGNOSIS — M797 Fibromyalgia: Secondary | ICD-10-CM | POA: Diagnosis present

## 2016-05-31 DIAGNOSIS — Z86718 Personal history of other venous thrombosis and embolism: Secondary | ICD-10-CM

## 2016-05-31 DIAGNOSIS — M4310 Spondylolisthesis, site unspecified: Secondary | ICD-10-CM | POA: Diagnosis present

## 2016-05-31 DIAGNOSIS — M48061 Spinal stenosis, lumbar region without neurogenic claudication: Secondary | ICD-10-CM | POA: Diagnosis present

## 2016-05-31 DIAGNOSIS — Z6841 Body Mass Index (BMI) 40.0 and over, adult: Secondary | ICD-10-CM

## 2016-05-31 DIAGNOSIS — Z888 Allergy status to other drugs, medicaments and biological substances status: Secondary | ICD-10-CM

## 2016-05-31 DIAGNOSIS — M4316 Spondylolisthesis, lumbar region: Principal | ICD-10-CM | POA: Diagnosis present

## 2016-05-31 DIAGNOSIS — J383 Other diseases of vocal cords: Secondary | ICD-10-CM | POA: Diagnosis present

## 2016-05-31 DIAGNOSIS — R569 Unspecified convulsions: Secondary | ICD-10-CM | POA: Diagnosis present

## 2016-05-31 DIAGNOSIS — Z91048 Other nonmedicinal substance allergy status: Secondary | ICD-10-CM

## 2016-05-31 DIAGNOSIS — F319 Bipolar disorder, unspecified: Secondary | ICD-10-CM | POA: Diagnosis present

## 2016-05-31 SURGERY — POSTERIOR LUMBAR FUSION 1 LEVEL
Anesthesia: General | Site: Back

## 2016-05-31 MED ORDER — FUROSEMIDE 40 MG PO TABS
40.0000 mg | ORAL_TABLET | Freq: Two times a day (BID) | ORAL | Status: DC
  Administered 2016-06-01 (×2): 40 mg via ORAL
  Filled 2016-05-31 (×3): qty 1

## 2016-05-31 MED ORDER — PREGABALIN 75 MG PO CAPS
300.0000 mg | ORAL_CAPSULE | Freq: Every day | ORAL | Status: DC
  Administered 2016-05-31 – 2016-06-01 (×2): 300 mg via ORAL
  Filled 2016-05-31 (×2): qty 4

## 2016-05-31 MED ORDER — LACTATED RINGERS IV SOLN
INTRAVENOUS | Status: DC
  Administered 2016-05-31: 11:00:00 via INTRAVENOUS

## 2016-05-31 MED ORDER — FLUTICASONE PROPIONATE 50 MCG/ACT NA SUSP
2.0000 | Freq: Two times a day (BID) | NASAL | Status: DC
  Administered 2016-05-31 – 2016-06-01 (×3): 2 via NASAL
  Filled 2016-05-31: qty 16

## 2016-05-31 MED ORDER — HYDROXYZINE HCL 25 MG PO TABS
25.0000 mg | ORAL_TABLET | Freq: Three times a day (TID) | ORAL | Status: DC
  Administered 2016-05-31 – 2016-06-02 (×4): 25 mg via ORAL
  Filled 2016-05-31 (×4): qty 1

## 2016-05-31 MED ORDER — CETIRIZINE-PSEUDOEPHEDRINE ER 5-120 MG PO TB12: 1.0000 | ORAL_TABLET | Freq: Two times a day (BID) | ORAL | Status: DC

## 2016-05-31 MED ORDER — BUPIVACAINE HCL (PF) 0.25 % IJ SOLN
INTRAMUSCULAR | Status: AC
  Filled 2016-05-31: qty 30

## 2016-05-31 MED ORDER — THROMBIN 20000 UNITS EX SOLR
CUTANEOUS | Status: AC
  Filled 2016-05-31: qty 20000

## 2016-05-31 MED ORDER — HYDROMORPHONE HCL 2 MG PO TABS
2.0000 mg | ORAL_TABLET | Freq: Every day | ORAL | Status: DC
  Administered 2016-05-31 – 2016-06-01 (×2): 2 mg via ORAL
  Filled 2016-05-31 (×2): qty 1

## 2016-05-31 MED ORDER — DOCUSATE SODIUM 100 MG PO CAPS
200.0000 mg | ORAL_CAPSULE | Freq: Two times a day (BID) | ORAL | Status: DC
  Administered 2016-05-31 – 2016-06-01 (×2): 200 mg via ORAL
  Filled 2016-05-31 (×3): qty 2

## 2016-05-31 MED ORDER — BUPIVACAINE HCL (PF) 0.25 % IJ SOLN
INTRAMUSCULAR | Status: DC | PRN
  Administered 2016-05-31: 30 mL

## 2016-05-31 MED ORDER — PRAMIPEXOLE DIHYDROCHLORIDE 0.25 MG PO TABS
0.5000 mg | ORAL_TABLET | Freq: Every day | ORAL | Status: DC
  Administered 2016-05-31 – 2016-06-01 (×2): 0.5 mg via ORAL
  Filled 2016-05-31 (×2): qty 2

## 2016-05-31 MED ORDER — PROPOFOL 10 MG/ML IV BOLUS
INTRAVENOUS | Status: AC
  Filled 2016-05-31: qty 20

## 2016-05-31 MED ORDER — SODIUM CHLORIDE 0.9 % IR SOLN
Status: DC | PRN
  Administered 2016-05-31: 500 mL

## 2016-05-31 MED ORDER — VANCOMYCIN HCL 1000 MG IV SOLR
INTRAVENOUS | Status: DC | PRN
  Administered 2016-05-31: 1000 mg via TOPICAL

## 2016-05-31 MED ORDER — DIAZEPAM 5 MG PO TABS
5.0000 mg | ORAL_TABLET | Freq: Four times a day (QID) | ORAL | Status: DC | PRN
  Administered 2016-05-31: 5 mg via ORAL
  Filled 2016-05-31: qty 1

## 2016-05-31 MED ORDER — PANTOPRAZOLE SODIUM 40 MG PO TBEC
40.0000 mg | DELAYED_RELEASE_TABLET | Freq: Every day | ORAL | Status: DC
  Administered 2016-06-01: 40 mg via ORAL
  Filled 2016-05-31: qty 1

## 2016-05-31 MED ORDER — MECLIZINE HCL 12.5 MG PO TABS
25.0000 mg | ORAL_TABLET | Freq: Every day | ORAL | Status: DC
  Administered 2016-05-31 – 2016-06-01 (×2): 25 mg via ORAL
  Filled 2016-05-31 (×2): qty 2

## 2016-05-31 MED ORDER — FENTANYL CITRATE (PF) 100 MCG/2ML IJ SOLN: 25.0000 ug | INTRAMUSCULAR | Status: DC | PRN

## 2016-05-31 MED ORDER — MIDAZOLAM HCL 5 MG/5ML IJ SOLN
INTRAMUSCULAR | Status: DC | PRN
  Administered 2016-05-31: 2 mg via INTRAVENOUS
  Administered 2016-05-31: 4 mg via INTRAVENOUS

## 2016-05-31 MED ORDER — CALCIUM CARBONATE 1500 (600 CA) MG PO TABS
600.0000 mg | ORAL_TABLET | Freq: Every day | ORAL | Status: DC
  Administered 2016-06-01: 1500 mg via ORAL
  Filled 2016-05-31 (×2): qty 1

## 2016-05-31 MED ORDER — SUGAMMADEX SODIUM 200 MG/2ML IV SOLN
INTRAVENOUS | Status: DC | PRN
Start: 2016-05-31 — End: 2016-05-31
  Administered 2016-05-31: 300 mg via INTRAVENOUS

## 2016-05-31 MED ORDER — LIDOCAINE HCL (CARDIAC) 20 MG/ML IV SOLN
INTRAVENOUS | Status: DC | PRN
  Administered 2016-05-31: 100 mg via INTRAVENOUS

## 2016-05-31 MED ORDER — LISDEXAMFETAMINE DIMESYLATE 70 MG PO CAPS
70.0000 mg | ORAL_CAPSULE | Freq: Every day | ORAL | Status: DC
  Administered 2016-05-31 – 2016-06-01 (×2): 70 mg via ORAL
  Filled 2016-05-31 (×2): qty 1

## 2016-05-31 MED ORDER — SODIUM CHLORIDE 0.9 % IV SOLN: 250.0000 mL | INTRAVENOUS | Status: DC

## 2016-05-31 MED ORDER — ALBUTEROL SULFATE (2.5 MG/3ML) 0.083% IN NEBU: 3.0000 mL | INHALATION_SOLUTION | Freq: Four times a day (QID) | RESPIRATORY_TRACT | Status: DC | PRN

## 2016-05-31 MED ORDER — ROCURONIUM BROMIDE 100 MG/10ML IV SOLN
INTRAVENOUS | Status: DC | PRN
  Administered 2016-05-31: 30 mg via INTRAVENOUS
  Administered 2016-05-31: 70 mg via INTRAVENOUS

## 2016-05-31 MED ORDER — LACTATED RINGERS IV SOLN
INTRAVENOUS | Status: DC | PRN
  Administered 2016-05-31 (×2): via INTRAVENOUS

## 2016-05-31 MED ORDER — PANTOPRAZOLE SODIUM 40 MG PO TBEC: 40.0000 mg | DELAYED_RELEASE_TABLET | Freq: Every day | ORAL | Status: DC

## 2016-05-31 MED ORDER — LAMOTRIGINE 100 MG PO TABS
100.0000 mg | ORAL_TABLET | Freq: Every day | ORAL | Status: DC
  Administered 2016-05-31 – 2016-06-01 (×2): 100 mg via ORAL
  Filled 2016-05-31 (×2): qty 1

## 2016-05-31 MED ORDER — POLYSACCHARIDE IRON COMPLEX 150 MG PO CAPS
150.0000 mg | ORAL_CAPSULE | Freq: Every day | ORAL | Status: DC
  Filled 2016-05-31 (×2): qty 1

## 2016-05-31 MED ORDER — LUBIPROSTONE 8 MCG PO CAPS
8.0000 ug | ORAL_CAPSULE | Freq: Two times a day (BID) | ORAL | Status: DC
  Administered 2016-05-31 – 2016-06-01 (×3): 8 ug via ORAL
  Filled 2016-05-31 (×4): qty 1

## 2016-05-31 MED ORDER — HYDROCODONE-ACETAMINOPHEN 10-325 MG PO TABS
1.0000 | ORAL_TABLET | ORAL | Status: DC | PRN
  Administered 2016-05-31 – 2016-06-02 (×8): 2 via ORAL
  Filled 2016-05-31 (×8): qty 2

## 2016-05-31 MED ORDER — MIDAZOLAM HCL 2 MG/2ML IJ SOLN
INTRAMUSCULAR | Status: AC
  Filled 2016-05-31: qty 4

## 2016-05-31 MED ORDER — FLUOXETINE HCL 20 MG PO CAPS
40.0000 mg | ORAL_CAPSULE | Freq: Every day | ORAL | Status: DC
Start: 2016-06-01 — End: 2016-06-02
  Administered 2016-06-01: 40 mg via ORAL
  Filled 2016-05-31: qty 2

## 2016-05-31 MED ORDER — VENLAFAXINE HCL ER 75 MG PO CP24
150.0000 mg | ORAL_CAPSULE | Freq: Every day | ORAL | Status: DC
  Administered 2016-06-01: 150 mg via ORAL
  Filled 2016-05-31 (×3): qty 2

## 2016-05-31 MED ORDER — MELATONIN 3 MG PO TABS
3.0000 mg | ORAL_TABLET | Freq: Every day | ORAL | Status: DC
  Administered 2016-05-31 – 2016-06-01 (×2): 3 mg via ORAL
  Filled 2016-05-31 (×3): qty 1

## 2016-05-31 MED ORDER — POTASSIUM GLUCONATE 595 (99 K) MG PO TABS: 595.0000 mg | ORAL_TABLET | Freq: Two times a day (BID) | ORAL | Status: DC

## 2016-05-31 MED ORDER — HYDROMORPHONE HCL 2 MG PO TABS
1.0000 mg | ORAL_TABLET | Freq: Every day | ORAL | Status: DC
  Administered 2016-06-01: 1 mg via ORAL
  Filled 2016-05-31: qty 1

## 2016-05-31 MED ORDER — PHENYLEPHRINE HCL 10 MG/ML IJ SOLN
INTRAVENOUS | Status: DC | PRN
  Administered 2016-05-31: 25 ug/min via INTRAVENOUS

## 2016-05-31 MED ORDER — SODIUM CHLORIDE 0.9% FLUSH
3.0000 mL | Freq: Two times a day (BID) | INTRAVENOUS | Status: DC
  Administered 2016-05-31: 3 mL via INTRAVENOUS

## 2016-05-31 MED ORDER — ONDANSETRON HCL 4 MG PO TABS: 4.0000 mg | ORAL_TABLET | Freq: Four times a day (QID) | ORAL | Status: DC | PRN

## 2016-05-31 MED ORDER — MIDAZOLAM HCL 2 MG/2ML IJ SOLN
INTRAMUSCULAR | Status: AC
  Filled 2016-05-31: qty 2

## 2016-05-31 MED ORDER — VANCOMYCIN HCL 1000 MG IV SOLR
INTRAVENOUS | Status: AC
  Filled 2016-05-31: qty 1000

## 2016-05-31 MED ORDER — ASCORBIC ACID 120 MG PO CHEW: CHEWABLE_TABLET | Freq: Every day | ORAL | Status: DC

## 2016-05-31 MED ORDER — ONDANSETRON HCL 4 MG/2ML IJ SOLN: 4.0000 mg | Freq: Four times a day (QID) | INTRAMUSCULAR | Status: DC | PRN

## 2016-05-31 MED ORDER — SUGAMMADEX SODIUM 200 MG/2ML IV SOLN
INTRAVENOUS | Status: AC
  Filled 2016-05-31: qty 2

## 2016-05-31 MED ORDER — CEFAZOLIN SODIUM-DEXTROSE 1-4 GM/50ML-% IV SOLN
1.0000 g | Freq: Three times a day (TID) | INTRAVENOUS | Status: AC
  Administered 2016-05-31 (×2): 1 g via INTRAVENOUS
  Filled 2016-05-31 (×2): qty 50

## 2016-05-31 MED ORDER — CYCLOBENZAPRINE HCL 10 MG PO TABS
20.0000 mg | ORAL_TABLET | Freq: Every day | ORAL | Status: DC
  Administered 2016-05-31 – 2016-06-01 (×2): 20 mg via ORAL
  Filled 2016-05-31 (×2): qty 2

## 2016-05-31 MED ORDER — SODIUM CHLORIDE 0.9 % IR SOLN
Status: DC | PRN
  Administered 2016-05-31: 1000 mL

## 2016-05-31 MED ORDER — CYCLOSPORINE 0.05 % OP EMUL
1.0000 [drp] | Freq: Two times a day (BID) | OPHTHALMIC | Status: DC
  Administered 2016-05-31 – 2016-06-01 (×3): 1 [drp] via OPHTHALMIC
  Filled 2016-05-31 (×4): qty 1

## 2016-05-31 MED ORDER — PHENOL 1.4 % MT LIQD
1.0000 | OROMUCOSAL | Status: DC | PRN
Start: 2016-05-31 — End: 2016-06-02

## 2016-05-31 MED ORDER — CALCITONIN (SALMON) 200 UNIT/ACT NA SOLN
1.0000 | Freq: Every day | NASAL | Status: DC
  Administered 2016-06-01: 1 via NASAL
  Filled 2016-05-31: qty 3.7

## 2016-05-31 MED ORDER — FENTANYL CITRATE (PF) 100 MCG/2ML IJ SOLN
INTRAMUSCULAR | Status: DC | PRN
  Administered 2016-05-31: 50 ug via INTRAVENOUS
  Administered 2016-05-31: 100 ug via INTRAVENOUS
  Administered 2016-05-31: 50 ug via INTRAVENOUS

## 2016-05-31 MED ORDER — VITAMIN D 1000 UNITS PO TABS
1000.0000 [IU] | ORAL_TABLET | Freq: Every day | ORAL | Status: DC
  Administered 2016-06-01: 1000 [IU] via ORAL
  Filled 2016-05-31: qty 1

## 2016-05-31 MED ORDER — HYDROMORPHONE HCL 1 MG/ML IJ SOLN: 0.5000 mg | INTRAMUSCULAR | Status: DC | PRN

## 2016-05-31 MED ORDER — SODIUM CHLORIDE 0.9% FLUSH: 3.0000 mL | INTRAVENOUS | Status: DC | PRN

## 2016-05-31 MED ORDER — PROPOFOL 10 MG/ML IV BOLUS
INTRAVENOUS | Status: DC | PRN
  Administered 2016-05-31: 150 mg via INTRAVENOUS

## 2016-05-31 MED ORDER — EPHEDRINE SULFATE 50 MG/ML IJ SOLN
INTRAMUSCULAR | Status: DC | PRN
  Administered 2016-05-31 (×2): 10 mg via INTRAVENOUS

## 2016-05-31 MED ORDER — DEXAMETHASONE SODIUM PHOSPHATE 10 MG/ML IJ SOLN
10.0000 mg | INTRAMUSCULAR | Status: AC
  Administered 2016-05-31: 10 mg via INTRAVENOUS
  Filled 2016-05-31: qty 1

## 2016-05-31 MED ORDER — EZETIMIBE 10 MG PO TABS
10.0000 mg | ORAL_TABLET | Freq: Every day | ORAL | Status: DC
  Administered 2016-05-31 – 2016-06-01 (×2): 10 mg via ORAL
  Filled 2016-05-31 (×2): qty 1

## 2016-05-31 MED ORDER — FENTANYL CITRATE (PF) 250 MCG/5ML IJ SOLN
INTRAMUSCULAR | Status: AC
  Filled 2016-05-31: qty 5

## 2016-05-31 MED ORDER — THROMBIN 20000 UNITS EX SOLR
CUTANEOUS | Status: DC | PRN
  Administered 2016-05-31: 20 mL via TOPICAL

## 2016-05-31 MED ORDER — BUTALBITAL-APAP-CAFFEINE 50-325-40 MG PO TABS: 2.0000 | ORAL_TABLET | ORAL | Status: DC | PRN

## 2016-05-31 MED ORDER — HYDROCHLOROTHIAZIDE 12.5 MG PO CAPS
12.5000 mg | ORAL_CAPSULE | Freq: Every day | ORAL | Status: DC
  Administered 2016-06-01: 12.5 mg via ORAL
  Filled 2016-05-31: qty 1

## 2016-05-31 MED ORDER — MENTHOL 3 MG MT LOZG: 1.0000 | LOZENGE | OROMUCOSAL | Status: DC | PRN

## 2016-05-31 MED ORDER — LORATADINE 10 MG PO TABS
10.0000 mg | ORAL_TABLET | Freq: Every day | ORAL | Status: DC
  Administered 2016-06-01: 10 mg via ORAL
  Filled 2016-05-31 (×2): qty 1

## 2016-05-31 MED ORDER — ONDANSETRON HCL 4 MG/2ML IJ SOLN
INTRAMUSCULAR | Status: AC
  Filled 2016-05-31: qty 2

## 2016-05-31 MED ORDER — ALPRAZOLAM 0.5 MG PO TABS
2.0000 mg | ORAL_TABLET | Freq: Every day | ORAL | Status: DC
  Administered 2016-05-31 – 2016-06-01 (×2): 2 mg via ORAL
  Filled 2016-05-31 (×2): qty 4

## 2016-05-31 MED ORDER — ARIPIPRAZOLE 15 MG PO TABS
30.0000 mg | ORAL_TABLET | Freq: Every day | ORAL | Status: DC
  Administered 2016-05-31 – 2016-06-01 (×2): 30 mg via ORAL
  Filled 2016-05-31 (×2): qty 2

## 2016-05-31 MED ORDER — CEFAZOLIN SODIUM-DEXTROSE 2-4 GM/100ML-% IV SOLN
2.0000 g | INTRAVENOUS | Status: AC
  Administered 2016-05-31: 2 g via INTRAVENOUS
  Filled 2016-05-31: qty 100

## 2016-05-31 MED ORDER — ALPRAZOLAM 0.5 MG PO TABS
1.0000 mg | ORAL_TABLET | Freq: Every day | ORAL | Status: DC
  Administered 2016-06-01: 1 mg via ORAL
  Filled 2016-05-31: qty 2

## 2016-05-31 SURGICAL SUPPLY — 71 items
ADH SKN CLS APL DERMABOND .7 (GAUZE/BANDAGES/DRESSINGS) ×1
APL SKNCLS STERI-STRIP NONHPOA (GAUZE/BANDAGES/DRESSINGS) ×1
BAG DECANTER FOR FLEXI CONT (MISCELLANEOUS) ×3 IMPLANT
BENZOIN TINCTURE PRP APPL 2/3 (GAUZE/BANDAGES/DRESSINGS) ×3 IMPLANT
BLADE CLIPPER SURG (BLADE) IMPLANT
BUR CUTTER 7.0 ROUND (BURR) ×2 IMPLANT
BUR MATCHSTICK NEURO 3.0 LAGG (BURR) ×3 IMPLANT
CANISTER SUCT 3000ML PPV (MISCELLANEOUS) ×3 IMPLANT
CAP LCK SPNE (Orthopedic Implant) ×4 IMPLANT
CAP LOCK SPINE RADIUS (Orthopedic Implant) IMPLANT
CAP LOCKING (Orthopedic Implant) ×12 IMPLANT
CARTRIDGE OIL MAESTRO DRILL (MISCELLANEOUS) ×1 IMPLANT
CLOSURE WOUND 1/2 X4 (GAUZE/BANDAGES/DRESSINGS) ×2
CONT SPEC 4OZ CLIKSEAL STRL BL (MISCELLANEOUS) ×3 IMPLANT
CORDS BIPOLAR (ELECTRODE) ×2 IMPLANT
COVER BACK TABLE 60X90IN (DRAPES) ×3 IMPLANT
DECANTER SPIKE VIAL GLASS SM (MISCELLANEOUS) ×3 IMPLANT
DERMABOND ADVANCED (GAUZE/BANDAGES/DRESSINGS) ×2
DERMABOND ADVANCED .7 DNX12 (GAUZE/BANDAGES/DRESSINGS) ×1 IMPLANT
DEVICE INTERBODY ELEVATE 23X7 (Cage) ×4 IMPLANT
DIFFUSER DRILL AIR PNEUMATIC (MISCELLANEOUS) ×3 IMPLANT
DRAPE C-ARM 42X72 X-RAY (DRAPES) ×6 IMPLANT
DRAPE HALF SHEET 40X57 (DRAPES) IMPLANT
DRAPE LAPAROTOMY 100X72X124 (DRAPES) ×3 IMPLANT
DRAPE POUCH INSTRU U-SHP 10X18 (DRAPES) ×3 IMPLANT
DRAPE SURG 17X23 STRL (DRAPES) ×12 IMPLANT
DRSG OPSITE POSTOP 4X6 (GAUZE/BANDAGES/DRESSINGS) ×2 IMPLANT
DURAPREP 26ML APPLICATOR (WOUND CARE) ×3 IMPLANT
ELECT REM PT RETURN 9FT ADLT (ELECTROSURGICAL) ×3
ELECTRODE REM PT RTRN 9FT ADLT (ELECTROSURGICAL) ×1 IMPLANT
EVACUATOR 1/8 PVC DRAIN (DRAIN) ×1 IMPLANT
GAUZE SPONGE 4X4 12PLY STRL (GAUZE/BANDAGES/DRESSINGS) ×3 IMPLANT
GAUZE SPONGE 4X4 16PLY XRAY LF (GAUZE/BANDAGES/DRESSINGS) IMPLANT
GLOVE BIO SURGEON STRL SZ8 (GLOVE) ×2 IMPLANT
GLOVE BIO SURGEON STRL SZ8.5 (GLOVE) ×2 IMPLANT
GLOVE BIOGEL PI IND STRL 7.5 (GLOVE) IMPLANT
GLOVE BIOGEL PI INDICATOR 7.5 (GLOVE) ×6
GLOVE ECLIPSE 7.0 STRL STRAW (GLOVE) ×2 IMPLANT
GLOVE ECLIPSE 9.0 STRL (GLOVE) ×6 IMPLANT
GLOVE EXAM NITRILE LRG STRL (GLOVE) IMPLANT
GLOVE EXAM NITRILE XL STR (GLOVE) IMPLANT
GLOVE EXAM NITRILE XS STR PU (GLOVE) ×4 IMPLANT
GOWN STRL REUS W/ TWL LRG LVL3 (GOWN DISPOSABLE) IMPLANT
GOWN STRL REUS W/ TWL XL LVL3 (GOWN DISPOSABLE) ×2 IMPLANT
GOWN STRL REUS W/TWL 2XL LVL3 (GOWN DISPOSABLE) IMPLANT
GOWN STRL REUS W/TWL LRG LVL3 (GOWN DISPOSABLE) ×3
GOWN STRL REUS W/TWL XL LVL3 (GOWN DISPOSABLE) ×9
GRAFT BN 5X1XSPNE CVD POST DBM (Bone Implant) IMPLANT
GRAFT BONE MAGNIFUSE 1X5CM (Bone Implant) ×3 IMPLANT
KIT BASIN OR (CUSTOM PROCEDURE TRAY) ×3 IMPLANT
KIT ROOM TURNOVER OR (KITS) ×3 IMPLANT
MILL MEDIUM DISP (BLADE) ×2 IMPLANT
NEEDLE HYPO 22GX1.5 SAFETY (NEEDLE) ×3 IMPLANT
NS IRRIG 1000ML POUR BTL (IV SOLUTION) ×3 IMPLANT
OIL CARTRIDGE MAESTRO DRILL (MISCELLANEOUS) ×3
PACK LAMINECTOMY NEURO (CUSTOM PROCEDURE TRAY) ×3 IMPLANT
PATTIES SURGICAL .5 X.5 (GAUZE/BANDAGES/DRESSINGS) IMPLANT
PATTIES SURGICAL 1X1 (DISPOSABLE) IMPLANT
ROD RADIUS 40MM (Neuro Prosthesis/Implant) ×6 IMPLANT
ROD SPNL 40X5.5XNS TI RDS (Neuro Prosthesis/Implant) IMPLANT
SCREW 5.75X40M (Screw) ×8 IMPLANT
SPONGE SURGIFOAM ABS GEL 100 (HEMOSTASIS) ×3 IMPLANT
STRIP CLOSURE SKIN 1/2X4 (GAUZE/BANDAGES/DRESSINGS) ×4 IMPLANT
SUT VIC AB 0 CT1 18XCR BRD8 (SUTURE) ×2 IMPLANT
SUT VIC AB 0 CT1 8-18 (SUTURE) ×6
SUT VIC AB 2-0 CT1 18 (SUTURE) ×3 IMPLANT
SUT VIC AB 3-0 SH 8-18 (SUTURE) ×6 IMPLANT
TOWEL GREEN STERILE (TOWEL DISPOSABLE) ×3 IMPLANT
TOWEL GREEN STERILE FF (TOWEL DISPOSABLE) ×2 IMPLANT
TRAY FOLEY W/METER SILVER 16FR (SET/KITS/TRAYS/PACK) ×3 IMPLANT
WATER STERILE IRR 1000ML POUR (IV SOLUTION) ×3 IMPLANT

## 2016-05-31 NOTE — Brief Op Note (Signed)
05/31/2016  2:34 PM  PATIENT:  [name]  71 y.o. female  PRE-OPERATIVE DIAGNOSIS:  HNP  POST-OPERATIVE DIAGNOSIS:  HNP  PROCEDURE:  Procedure(s): Posterior Lumbar One-Two Interbody and Fusion (N/A)  SURGEON:  Surgeon(s) and Role:    * Julio Sicks, MD - Primary    * Tressie Stalker, MD - Assisting  PHYSICIAN ASSISTANT:   ASSISTANTS:    ANESTHESIA:   general  EBL:  Total I/O In: 1000 [I.V.:1000] Out: 35 [Urine:35]  BLOOD ADMINISTERED:none  DRAINS: none   LOCAL MEDICATIONS USED:  MARCAINE     SPECIMEN:  No Specimen  DISPOSITION OF SPECIMEN:  N/A  COUNTS:  YES  TOURNIQUET:  * No tourniquets in log *  DICTATION: .Dragon Dictation  PLAN OF CARE: Admit to inpatient   PATIENT DISPOSITION:  PACU - hemodynamically stable.   Delay start of Pharmacological VTE agent (>24hrs) due to surgical blood loss or risk of bleeding: yes

## 2016-05-31 NOTE — Transfer of Care (Signed)
Immediate Anesthesia Transfer of Care Note  Patient: [name]  Procedure(s) Performed: Procedure(s): Posterior Lumbar One-Two Interbody and Fusion (N/A)  Patient Location: PACU  Anesthesia Type:General  Level of Consciousness: awake and sedated  Airway & Oxygen Therapy: Patient Spontanous Breathing and Patient connected to face mask oxygen  Post-op Assessment: Report given to RN, Post -op Vital signs reviewed and stable and Patient moving all extremities X 4  Post vital signs: Reviewed and stable  Last Vitals:  Vitals:   05/31/16 1031  BP: (!) 132/48  Pulse: 69  Resp: 20  Temp: 36.8 C    Last Pain:  Vitals:   05/31/16 1033  TempSrc:   PainSc: 7       Patients Stated Pain Goal: 3 (05/31/16 1033)  Complications: No apparent anesthesia complications

## 2016-05-31 NOTE — Anesthesia Procedure Notes (Incomplete)
Procedures

## 2016-05-31 NOTE — H&P (Signed)
Riah Kehoe is an 71 y.o. female.   Chief Complaint: Back pain HPI: 71 year old female status post previous L2-3, L3-4 and L4-5 lumbar fusions presents now with worsening back and bilateral lower extremity symptoms. Patient with severe pain with attempting to stand straight or ambulate. Patient with progressive numbness,. Seizures and some weakness in her anterior thighs and lower extremities. Patient's failed conservative management. Workup demonstrates evidence of progressive adjacent level disc degeneration with retrolisthesis, lateral listhesis and stenosis. Patient presents now for decompression and fusion at L1-2 in hopes of improving her symptoms.  Past Medical History:  Diagnosis Date  . Anxiety   . Asthma   . Bipolar affective disorder (HCC)   . Cough   . Depression   . DJD (degenerative joint disease) of lumbar spine   . DVT (deep venous thrombosis) (HCC) 2010   groin left - on coumadin for 6 months  . Family history of anesthesia complication    Hx: father has nausea  . Fibromyalgia   . GERD (gastroesophageal reflux disease)   . Hypertension    Hx: of in 2011  . Left leg DVT (HCC) 2010  . Migraine   . Pneumonia   . PONV (postoperative nausea and vomiting)    and migraines  . Shortness of breath    exertion  . Spinal cord stimulator dysfunction (HCC)    has one in,but not working  . Spinal headache   . Vertigo    Hx: of  . Vocal cord dysfunction    sees dr. Delton Coombes    Past Surgical History:  Procedure Laterality Date  . ANTERIOR LAT LUMBAR FUSION Left 02/15/2014   Procedure: ANTERIOR LATERAL LUMBAR INTERBODY FUSION LUMBAR TWO-THREE,LUMBAR THREE-FOUR WITH LATERAL PLATE.;  Surgeon: Temple Pacini, MD;  Location: [facility];  Service: Neurosurgery;  Laterality: Left;  left   . APPENDECTOMY    . BACK SURGERY    . BREAST ENHANCEMENT SURGERY  1989  . CARPAL TUNNEL RELEASE  2006   Right hand  . CATARACT EXTRACTION W/ INTRAOCULAR LENS  IMPLANT, BILATERAL     Hx: of   . CERVICAL FUSION  2005  . CHOLECYSTECTOMY  1989  . COLONOSCOPY W/ BIOPSIES AND POLYPECTOMY     Hx: of  . FINGER ARTHROSCOPY WITH CARPOMETACARPEL (CMC) ARTHROPLASTY     thumb  . FRACTURE SURGERY Right 09/2013   ankle and leg - plate and screws  . LUMBAR LAMINECTOMY/DECOMPRESSION MICRODISCECTOMY Right 11/06/2012   Procedure: LUMBAR TWO THREE LUMBAR LAMINECTOMY/DECOMPRESSION MICRODISCECTOMY 1 LEVEL;  Surgeon: Temple Pacini, MD;  Location: [facility];  Service: Neurosurgery;  Laterality: Right;  . PARTIAL HYSTERECTOMY  1980  . SHOULDER ARTHROSCOPY    . SPINAL CORD STIMULATOR IMPLANT  2010  . TARSAL SUSPENSION     Hx; of left thumb  . TONSILLECTOMY  1955  . UPPER GI ENDOSCOPY     Hx: of    Family History  Problem Relation Age of Onset  . Lung cancer Mother   . Prostate cancer Father   . Heart disease Father   . Rheum arthritis Paternal Grandmother   . Asthma Sister    Social History:  reports that she quit smoking about 29 years ago. Her smoking use included Cigarettes. She has a 52.50 pack-year smoking history. She has never used smokeless tobacco. She reports that she does not drink alcohol or use drugs.  Allergies:  Allergies  Allergen Reactions  . Lithium Other (See Comments)    Migraines and "drunk"  .  Codeine Nausea And Vomiting  . Lisinopril Other (See Comments)    kidney failure  . Scopolamine Other (See Comments)    Causes vertigo  . Statins Other (See Comments)    Very weak and achy  . Adhesive [Tape] Rash    All tape, paper and steri-strips included    Medications Prior to Admission  Medication Sig Dispense Refill  . albuterol (PROVENTIL HFA;VENTOLIN HFA) 108 (90 BASE) MCG/ACT inhaler Inhale 2 puffs into the lungs every 6 (six) hours as needed for wheezing.    Marland Kitchen ALPRAZolam (XANAX) 1 MG tablet Take 1-2 mg by mouth See admin instructions. Takes  at noon and  at bedtime.    . ARIPiprazole (ABILIFY) 30 MG tablet Take 30 mg by mouth at bedtime.  5  .  Ascorbic Acid (VITAMIN C GUMMIE PO) Take 1 tablet by mouth daily.    . butalbital-acetaminophen-caffeine (FIORICET, ESGIC) 50-325-40 MG per tablet Take 2 tablets by mouth every 4 (four) hours as needed for headache or migraine.     . calcitonin, salmon, (MIACALCIN/FORTICAL) 200 UNIT/ACT nasal spray Place 1 spray into alternate nostrils daily.    . calcium carbonate (OSCAL) 1500 (600 Ca) MG TABS tablet Take 600 mg of elemental calcium by mouth daily with breakfast.    . CETIRIZINE-PSEUDOEPHEDRINE ER PO Take 1 tablet by mouth at bedtime. Cetirizine 2.5mg , pseudoephedrine     . Cholecalciferol (VITAMIN D3 ADULT GUMMIES) 1000 UNITS CHEW Chew 1,000 Units by mouth daily with breakfast.     . cyclobenzaprine (FLEXERIL) 10 MG tablet Take 20 mg by mouth at bedtime.     . cycloSPORINE (RESTASIS) 0.05 % ophthalmic emulsion Place 1 drop into both eyes 2 (two) times daily.    Marland Kitchen desvenlafaxine (PRISTIQ) 100 MG 24 hr tablet Take 100 mg by mouth at bedtime.     . docusate sodium (COLACE) 100 MG capsule Take 200 mg by mouth 2 (two) times daily.    Marland Kitchen esomeprazole (NEXIUM) 40 MG capsule Take 40 mg by mouth daily before breakfast.      . ezetimibe (ZETIA) 10 MG tablet Take 10 mg by mouth at bedtime.    Marland Kitchen FLUoxetine (PROZAC) 40 MG capsule Take 40 mg by mouth daily with breakfast.     . fluticasone (FLONASE) 50 MCG/ACT nasal spray Place 2 sprays into both nostrils 2 (two) times daily. 16 g 11  . furosemide (LASIX) 40 MG tablet Take 40 mg by mouth 2 (two) times daily.     . hydrochlorothiazide (MICROZIDE) 12.5 MG capsule Take 12.5 mg by mouth daily with breakfast.     . HYDROmorphone (DILAUDID) 2 MG tablet Take 1-2 mg by mouth 2 (two) times daily.  in the am and  at bedtime.    . hydrOXYzine (ATARAX/VISTARIL) 25 MG tablet Take 25 mg by mouth every 8 (eight) hours.     . iron polysaccharides (NIFEREX) 150 MG capsule Take 150 mg by mouth daily with breakfast.    . lamoTRIgine (LAMICTAL) 100 MG tablet Take 100 mg  by mouth at bedtime.      Marland Kitchen lisdexamfetamine (VYVANSE) 70 MG capsule Take 70 mg by mouth daily.    . Loratadine 10 MG CAPS Take 1 capsule by mouth daily.      Marland Kitchen lubiprostone (AMITIZA) 8 MCG capsule Take 8 mcg by mouth 2 (two) times daily with a meal.    . meclizine (ANTIVERT) 25 MG tablet Take 25 mg by mouth at bedtime.     . Melatonin 300 MCG  TABS Take 300 mcg by mouth daily.     Marland Kitchen omeprazole (PRILOSEC) 20 MG capsule Take 20 mg by mouth at bedtime.      Bertram Gala Glycol-Propyl Glycol (SYSTANE OP) Place 1 drop into both eyes at bedtime.    . potassium gluconate 595 MG TABS Take 595 mg by mouth 2 (two) times daily.     . pramipexole (MIRAPEX) 0.5 MG tablet Take 0.5 mg by mouth at bedtime.      . pregabalin (LYRICA) 150 MG capsule Take 300 mg by mouth at bedtime.      No results found for this or any previous visit (from the past 48 hour(s)). No results found.  Pertinent items noted in HPI and remainder of comprehensive ROS otherwise negative.  Blood pressure (!) 132/48, pulse 69, temperature 98.3 F (36.8 C), temperature source Oral, resp. rate 20, SpO2 97 %.  Patient is awake and alert. She is oriented and appropriate. Her cranial nerve function is intact. Her speech is fluent. Judgment and insight are intact. Motor examination her extremities reveals some mild weakness of hip flexion on the left otherwise motor strength intact. Sensory examination with mild decreased sensation to pinprick and light touch in her left L2 dermatome. Deep tendon reflexes are hypoactive but symmetric. Gait is antalgic. Posture is flexed. Examination head ears eyes nose throats unremarkable. Chest and abdomen are benign. Extremities are free from injury or deformity. 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 Plan bilateral L1 and L2 decompressive laminotomies and foraminotomies followed by posterior lumbar interbody fusion utilizing interbody expandable cages, locally harvested autograft, and  augmented with posterior lateral arthrodesis utilizing nonsegmental pedicle screw fixation and local autografting. Risks benefits of been explained. Patient wishes to proceed.  Gill Delrossi A 05/31/2016, 11:58 AM

## 2016-05-31 NOTE — Anesthesia Preprocedure Evaluation (Signed)
Anesthesia Evaluation  Patient identified by MRN, date of birth, ID band Patient awake    Reviewed: Allergy & Precautions, H&P , NPO status , Patient's Chart, lab work & pertinent test results  History of Anesthesia Complications (+) PONV  Airway Mallampati: II  TM Distance: >3 FB Neck ROM: Full    Dental  (+) Edentulous Upper, Edentulous Lower   Pulmonary shortness of breath, former smoker,    Pulmonary exam normal breath sounds clear to auscultation       Cardiovascular hypertension, Pt. on medications + DVT  Normal cardiovascular exam Rhythm:Regular Rate:Normal  6/14 exercise stress test: no ST changes   Neuro/Psych  Headaches, Bipolar Disorder Chronic pain: spinal cord stimulator, narcotics    GI/Hepatic Neg liver ROS, GERD  Medicated and Controlled,  Endo/Other  negative endocrine ROSMorbid obesity  Renal/GU negative Renal ROS     Musculoskeletal  (+) Fibromyalgia -, narcotic dependent  Abdominal (+) + obese,   Peds  Hematology   Anesthesia Other Findings Bipolar affective disorder GERD Hypertension     Vertigo   Vocal cord dysfunction sees dr. Delton Coombes Shortness of breath exertion MO    Asthma     Reproductive/Obstetrics                             Anesthesia Physical  Anesthesia Plan  ASA: III  Anesthesia Plan: General   Post-op Pain Management:    Induction: Intravenous  Airway Management Planned: Oral ETT  Additional Equipment:   Intra-op Plan:   Post-operative Plan: Extubation in OR  Informed Consent: I have reviewed the patients History and Physical, chart, labs and discussed the procedure including the risks, benefits and alternatives for the proposed anesthesia with the patient or authorized representative who has indicated his/her understanding and acceptance.   Dental advisory given  Plan Discussed with: CRNA and Surgeon  Anesthesia Plan Comments:  (Plan routine monitors, GETA)        Anesthesia Quick Evaluation

## 2016-05-31 NOTE — Op Note (Signed)
Date of procedure: 05/31/2016  Date of dictation: Same  Service: Neurosurgery  Preoperative diagnosis: L1-L2 herniated nucleus pulposus with stenosis and degenerative spondylolisthesis with stenosis  Postoperative diagnosis: Same  Procedure Name: Bilateral L1-L2 decompressive laminotomies and foraminotomies were, more than would be required for simple interbody fusion alone.  L1-L2 posterior lumbar interbody fusion utilizing interbody expandable cages, locally harvested autograft,  L1-L2 posterior lateral arthrodesis utilizing nonsegmental pedicle screw fixation and local autograft  Surgeon:Dillyn Menna A.Rolla Servidio, M.D.  Asst. Surgeon: Lovell Sheehan  Anesthesia: General  Indication: Patient status post multilevel lumbar decompression infusion presents with worsening back and bilateral lower extremity pain worse with standing or attempts to ambulate. Workup demonstrates evidence of adjacent level degeneration with a large paracentral disc herniation and severe stenosis at L1 to. There is coexistent dynamic instability at this level. The patient is failed conservative management and she presents now for decompression and fusion at L1-2.  Operative note: After induction of anesthesia, patient position prone onto Wilson frame and a properly padded. Lumbar region prepped and draped sterilely. Incision made overlying L1-2. Dissection performed bilaterally. Retractors placed. X-ray taken. Level confirmed. Decompressive laminotomies and foraminotomies performed using Leksell rongeurs Kerrison there is a high-speed drill to remove the inferior aspect of L1. The pars interarticularis of L1 and the inferior facet of L1. Superior facetectomy was performed at L2. Superior aspect lamina of L2 was resected. Ligament flavum elevated and resected. Foraminotomies in excess will be required for interbody fusion alone were performed on the course the exiting L1 and L2 nerve roots. Bilateral epidural venous plexus quite related  and cut. Bilateral microdiscectomies then performed including all elements of the disc herniation off to the right at L1-2. I disc spaces and prepared for interbody fusion. With the distractor placed the patient's right side thecal sac and nerve roots protected on the left side. Disc space cleaned of all soft tissue. A 7 mm Medtronic expandable extra lordotic cage was then impacted into place and expanded to its full extent. Distractor removed patient's right side. Disc space prepared on the right side. Morselized autograft packed in the interspace. A second cage impacted in place and expanded to its full extent. Pedicles of L1 and L2 were then identified using surface landmarks and intraoperative fluoroscopy. Superficial bone overlying the pedicles of L1 and L2 were removed and the high-speed drill per each pedicles and probed using pedicle awl each peg awl track was then probed and found to be solidly within the bone. Each pedicle awl track was then tapped with a screw tap. Each screw tap hole was probed and found to be solid and bone. 5.75 mm x 40 mm Stryker screws were placed bilaterally at L1 and L2. Final images revealed good position bone graft and hardware at proper upper level with normal lamina spine. The left-sided L2 screw is somewhat medially position. I explored the medial pedicle margin. There is some mild violation but given the sizer L2 pedicle I do not think this can be avoided. Certainly there is nothing compressive upon the left L2 nerve root itself. Morselized allograft and autograft was packed posterior laterally. Short segment titanium rod placed over the screw heads at L1 and L2. Locking caps placed over the screws were locking caps and engaged with the construct under compression. Wound is irrigated one final time. Thank Meissen powder placed the deep wound space. Wounds and close in layers of Vicryl sutures. Steri-Strips and sterile dressing were applied. No apparent complications. Patient  tolerated the procedure well and she returns  to the recovery room postop.

## 2016-05-31 NOTE — Anesthesia Procedure Notes (Signed)
Procedure Name: Intubation Date/Time: 05/31/2016 12:21 PM Performed by: Lanell Matar Pre-anesthesia Checklist: Patient identified, Emergency Drugs available, Suction available and Patient being monitored Patient Re-evaluated:Patient Re-evaluated prior to inductionOxygen Delivery Method: Circle System Utilized Preoxygenation: Pre-oxygenation with 100% oxygen Intubation Type: IV induction Ventilation: Mask ventilation without difficulty Laryngoscope Size: Miller and 2 Grade View: Grade I Tube type: Oral Tube size: 7.0 mm Number of attempts: 1 Airway Equipment and Method: Stylet and Oral airway Placement Confirmation: ETT inserted through vocal cords under direct vision,  positive ETCO2 and breath sounds checked- equal and bilateral Secured at: 21 cm Tube secured with: Tape Dental Injury: Teeth and Oropharynx as per pre-operative assessment

## 2016-06-01 MED FILL — Sodium Chloride IV Soln 0.9%: INTRAVENOUS | Qty: 1000 | Status: AC

## 2016-06-01 MED FILL — Heparin Sodium (Porcine) Inj 1000 Unit/ML: INTRAMUSCULAR | Qty: 30 | Status: AC

## 2016-06-01 NOTE — Evaluation (Signed)
Occupational Therapy Evaluation Patient Details Name: [name] MRN: [id number] DOB: May 23, 1945 Today's Date: 06/01/2016    History of Present Illness 71 yo female s/p L1-2 fusion  Past Medical History:  Diagnosis Date  . Anxiety   . Asthma   . Bipolar affective disorder (HCC)   . Cough   . Depression   . DJD (degenerative joint disease) of lumbar spine   . DVT (deep venous thrombosis) (HCC) 2010   groin left - on coumadin for 6 months  . Family history of anesthesia complication    Hx: father has nausea  . Fibromyalgia   . GERD (gastroesophageal reflux disease)   . Hypertension    Hx: of in 2011  . Left leg DVT (HCC) 2010  . Migraine   . Pneumonia   . PONV (postoperative nausea and vomiting)    and migraines  . Shortness of breath    exertion  . Spinal cord stimulator dysfunction (HCC)    has one in,but not working  . Spinal headache   . Vertigo    Hx: of  . Vocal cord dysfunction    sees dr. Delton Coombes      Clinical Impression   Patient evaluated by Occupational Therapy with no further acute OT needs identified. All education has been completed and the patient has no further questions. See below for any follow-up Occupational Therapy or equipment needs. OT to sign off. Thank you for referral.      Follow Up Recommendations  No OT follow up    Equipment Recommendations  None recommended by OT    Recommendations for Other Services       Precautions / Restrictions Precautions Precautions: Back Precaution Comments: back handout provided and reviewed in detail Required Braces or Orthoses: Spinal Brace Spinal Brace: Lumbar corset;Applied in sitting position Restrictions Weight Bearing Restrictions: No      Mobility Bed Mobility Overal bed mobility: Modified Independent             General bed mobility comments: has hospital bed at home with rails   Transfers Overall transfer level: Needs assistance Equipment used: Straight cane Transfers: Sit  to/from Stand Sit to Stand: Modified independent (Device/Increase time)              Balance                                           ADL either performed or assessed with clinical judgement   ADL Overall ADL's : Modified independent                                       General ADL Comments: pt able to cross L LE and almost able to cross R LE. pt has reacher at home and educated on use for dressing. pt uses 3n1 at baseline at home for toileting. pt  has wife at home that can help PRN . wife Harriett Sine works sitting animals and leaves normally 3x per day   Pt educated on bathing and avoid washing directly on incision. Pt educated to use new wash cloth and towel each day. Pt educated to allow water to run across dressing and not to soak in a tub at this time. Pt advised RN will instruct on any bandages required otherwise is open to air.  Back handout provided and reviewed adls in detail. Pt educated on: clothing between brace, never sleep in brace, set an alarm at night for medication, avoid sitting for long periods of time, correct bed positioning for sleeping, correct sequence for bed mobility, avoiding lifting more than 5 pounds and never wash directly over incision. All education is complete and patient indicates understanding.    Vision         Perception     Praxis      Pertinent Vitals/Pain Pain Assessment: Faces Faces Pain Scale: Hurts a little bit Pain Location: back Pain Descriptors / Indicators: Operative site guarding Pain Intervention(s): Premedicated before session;Monitored during session;Repositioned     Hand Dominance Right   Extremity/Trunk Assessment Upper Extremity Assessment Upper Extremity Assessment: Overall WFL for tasks assessed   Lower Extremity Assessment Lower Extremity Assessment: Defer to PT evaluation   Cervical / Trunk Assessment Cervical / Trunk Assessment: Other exceptions (s/p surg)   Communication  Communication Communication: No difficulties   Cognition Arousal/Alertness: Awake/alert Behavior During Therapy: WFL for tasks assessed/performed Overall Cognitive Status: Within Functional Limits for tasks assessed                                     General Comments       Exercises     Shoulder Instructions      Home Living Family/patient expects to be discharged to:: Private residence Living Arrangements: Spouse/significant other Available Help at Discharge: Family Type of Home: House Home Access: Ramped entrance     Home Layout: One level     Bathroom Shower/Tub: Chief Strategy Officer: Standard     Home Equipment: Cane - single point;Bedside commode;Shower seat;Adaptive equipment;Hospital bed   Additional Comments: has 25 + cats in the house, 8 dogs all but 2 inside       Prior Functioning/Environment Level of Independence: Independent        Comments: pt reports falls previously due to R LE weakness        OT Problem List:        OT Treatment/Interventions:      OT Goals(Current goals can be found in the care plan section) Acute Rehab OT Goals Patient Stated Goal: to stay one more night due to animals  OT Frequency:     Barriers to D/C:            Co-evaluation              End of Session Equipment Utilized During Treatment: Back brace Nurse Communication: Mobility status;Precautions  Activity Tolerance: Patient tolerated treatment well Patient left: in chair;with call bell/phone within reach  OT Visit Diagnosis: Unsteadiness on feet (R26.81)                Time: 1610-9604 OT Time Calculation (min): 20 min Charges:  OT General Charges $OT Visit: 1 Procedure OT Evaluation $OT Eval Moderate Complexity: 1 Procedure G-Codes:      Mateo Flow   OTR/L Pager: [phone] Office: [phone] .   Boone Master B 06/01/2016, 9:14 AM

## 2016-06-01 NOTE — Anesthesia Postprocedure Evaluation (Signed)
Anesthesia Post Note  Patient: [name]  Procedure(s) Performed: Procedure(s) (LRB): Posterior Lumbar One-Two Interbody and Fusion (N/A)  Patient location during evaluation: PACU Anesthesia Type: General Level of consciousness: awake and alert Pain management: pain level controlled Vital Signs Assessment: post-procedure vital signs reviewed and stable Respiratory status: spontaneous breathing, nonlabored ventilation, respiratory function stable and patient connected to nasal cannula oxygen Cardiovascular status: blood pressure returned to baseline and stable Postop Assessment: no signs of nausea or vomiting Anesthetic complications: no       Last Vitals:  Vitals:   06/01/16 0400 06/01/16 0743  BP: (!) 100/59 (!) 119/97  Pulse: 76 83  Resp: 18 18  Temp: 36.7 C 36.6 C    Last Pain:  Vitals:   06/01/16 0745  TempSrc:   PainSc: 7                  [name] EDWARD

## 2016-06-01 NOTE — Progress Notes (Signed)
Postoperative day 1. Overall doing well. Preoperative back and lower extremity pain much better. Standing straight or walking better.  On examination awake and alert. Oriented and appropriate. Afebrile. Vitals are stable. Wound clean and dry. Chest and abdomen benign.  Doing well following lumbar decompression and fusion. Patient requests one more day in the hospital for additional therapy. Plan discharge in morning

## 2016-06-01 NOTE — Evaluation (Signed)
Physical Therapy Evaluation Patient Details Name: [name] MRN: [id number] DOB: 1945/08/30 Today's Date: 06/01/2016   History of Present Illness  71 yo female s/p L1-2 fusion with PMH significant for HTN, vertigo, SOB, asthma, pneumonia, migraine, DVT, GERD, fibromyalgia, DJD, depression, bipolar disorder, anxiety.  Clinical Impression  Pt presents to PT with decreased tolerance for functional mobility due to above procedure.  Pt states she has had 8 back procedures in the past and is very familiar with back precautions.  She ambulated 200 ft with the cane requiring min guard assist for safety.  She also stated she would feel more comfortable staying another night before D/C home where her spouse is available to assist intermittently.  Feel this is appropriate as pt was unable to complete stair training today.  She will benefit from continued skilled therapy acutely and upon D/C home.  Will follow acutely    Follow Up Recommendations Outpatient PT (when appropriate per post op protocol);Supervision - Intermittent    Equipment Recommendations  None recommended by PT    Recommendations for Other Services       Precautions / Restrictions Precautions Precautions: Back;Fall Precaution Comments: Pt able to verbalize and demonstrate 3/3 precautions Required Braces or Orthoses: Spinal Brace Spinal Brace: Lumbar corset Restrictions Weight Bearing Restrictions: No      Mobility  Bed Mobility Overal bed mobility: Modified Independent             General bed mobility comments: Pt OOB in recliner at start of session but bed mobility discussed in detail.  Transfers Overall transfer level: Needs assistance Equipment used: Straight cane Transfers: Sit to/from Stand Sit to Stand: Modified independent (Device/Increase time)         General transfer comment: Cues required for precaution maintenance/upright posture.  Ambulation/Gait Ambulation/Gait assistance: Min  guard Ambulation Distance (Feet): 200 Feet Assistive device: Straight cane Gait Pattern/deviations: Step-through pattern;Decreased stride length;Decreased stance time - right;Wide base of support Gait velocity: decreased Gait velocity interpretation: Below normal speed for age/gender General Gait Details: Pt sounded SOB and required one <30 second standing break.  Pt stated she felt slightly dizzy but no vision changes.  Pt sped up towards end of walk in order to get to recliner quicker.  Pt educated on using RW for longer walks for energy conservation.  Stairs            Wheelchair Mobility    Modified Rankin (Stroke Patients Only)       Balance Overall balance assessment: Needs assistance Sitting-balance support: Single extremity supported;Feet supported Sitting balance-Leahy Scale: Fair     Standing balance support: Single extremity supported Standing balance-Leahy Scale: Fair                               Pertinent Vitals/Pain Pain Assessment: 0-10 Pain Score: 8  Faces Pain Scale: Hurts a little bit Pain Location: back Pain Descriptors / Indicators: Operative site guarding Pain Intervention(s): Monitored during session;Patient requesting pain meds-RN notified    Home Living Family/patient expects to be discharged to:: Private residence Living Arrangements: Spouse/significant other Available Help at Discharge: Family;Available PRN/intermittently Type of Home: House Home Access: Ramped entrance     Home Layout: One level Home Equipment: Cane - single point;Bedside commode;Shower seat;Adaptive equipment;Hospital bed Additional Comments: has 25 + cats in the house, 8 dogs all but 2 inside.  Wife works as a Airline pilot and can assist depending on work schedule.  Prior Function Level of Independence: Independent         Comments: pt reports falls previously due to R LE weakness     Hand Dominance   Dominant Hand: Right    Extremity/Trunk  Assessment   Upper Extremity Assessment Upper Extremity Assessment: Defer to OT evaluation    Lower Extremity Assessment Lower Extremity Assessment: RLE deficits/detail RLE Deficits / Details: Pt states she needs a TKR on R knee and it buckles sometimes    Cervical / Trunk Assessment Cervical / Trunk Assessment:  (forward head rounded shoulders)  Communication   Communication: No difficulties  Cognition Arousal/Alertness: Awake/alert Behavior During Therapy: WFL for tasks assessed/performed Overall Cognitive Status: Within Functional Limits for tasks assessed                                        General Comments General comments (skin integrity, edema, etc.): Pt expressed concern over managing her dogs at home.    Exercises     Assessment/Plan    PT Assessment Patient needs continued PT services  PT Problem List Decreased strength;Decreased activity tolerance;Decreased balance;Decreased mobility;Decreased coordination;Decreased knowledge of use of DME;Decreased safety awareness;Pain       PT Treatment Interventions DME instruction;Gait training;Stair training;Functional mobility training;Therapeutic activities;Therapeutic exercise;Balance training    PT Goals (Current goals can be found in the Care Plan section)  Acute Rehab PT Goals Patient Stated Goal: To stay another night PT Goal Formulation: With patient Time For Goal Achievement: 06/08/16 Potential to Achieve Goals: Good    Frequency Min 5X/week   Barriers to discharge        Co-evaluation               End of Session Equipment Utilized During Treatment: Gait belt;Back brace Activity Tolerance: Patient tolerated treatment well Patient left: in chair;with call bell/phone within reach Nurse Communication: Mobility status PT Visit Diagnosis: Unsteadiness on feet (R26.81);Other abnormalities of gait and mobility (R26.89)    Time: 9604-5409 PT Time Calculation (min) (ACUTE ONLY): 25  min   Charges:         PT G Codes:        Willaim Rayas SPT  Willaim Rayas 06/01/2016, 11:06 AM

## 2016-06-02 MED ORDER — CARISOPRODOL 350 MG PO TABS: 350.0000 mg | ORAL_TABLET | Freq: Four times a day (QID) | ORAL | 0 refills | Status: DC | PRN

## 2016-06-02 MED ORDER — HYDROMORPHONE HCL 2 MG PO TABS: 1.0000 mg | ORAL_TABLET | Freq: Two times a day (BID) | ORAL | 0 refills | Status: DC

## 2016-06-02 NOTE — Discharge Summary (Signed)
Physician Discharge Summary  Patient ID: [name] MRN: [id number] DOB/AGE: May 10, 1945 71 y.o.  Admit date: 05/31/2016 Discharge date: 06/02/2016  Admission Diagnoses:  Discharge Diagnoses:  Active Problems:   Degenerative spondylolisthesis   Discharged Condition: good  Hospital Course: Patient admitted to the hospital where she underwent on, located L1-L2 decompression and fusion. Postoperative she is improved. She standing and walking better. Her pain is well-controlled. She is ready for discharge home.  Consults:   Significant Diagnostic Studies:   Treatments:   Discharge Exam: Blood pressure (!) 104/52, pulse 74, temperature 98.8 F (37.1 C), temperature source Oral, resp. rate 18, SpO2 95 %. Awake and alert. Oriented and appropriate. Cranial nerve function intact. Motor sensory function extremities normal. Wound clean and dry. Chest and abdomen benign.  Disposition: 01-Home or Self Care   Allergies as of 06/02/2016      Reactions   Lithium Other (See Comments)   Migraines and "drunk"   Codeine Nausea And Vomiting   Lisinopril Other (See Comments)   kidney failure   Scopolamine Other (See Comments)   Causes vertigo   Statins Other (See Comments)   Very weak and achy   Adhesive [tape] Rash   All tape, paper and steri-strips included      Medication List    TAKE these medications   albuterol 108 (90 Base) MCG/ACT inhaler Commonly known as:  PROVENTIL HFA;VENTOLIN HFA Inhale 2 puffs into the lungs every 6 (six) hours as needed for wheezing.   ALPRAZolam 1 MG tablet Commonly known as:  XANAX Take 1-2 mg by mouth See admin instructions. Takes  at noon and  at bedtime.   ARIPiprazole 30 MG tablet Commonly known as:  ABILIFY Take 30 mg by mouth at bedtime.   butalbital-acetaminophen-caffeine 50-325-40 MG tablet Commonly known as:  FIORICET, ESGIC Take 2 tablets by mouth every 4 (four) hours as needed for headache or migraine.   calcitonin (salmon)  200 UNIT/ACT nasal spray Commonly known as:  MIACALCIN/FORTICAL Place 1 spray into alternate nostrils daily.   calcium carbonate 1500 (600 Ca) MG Tabs tablet Commonly known as:  OSCAL Take 600 mg of elemental calcium by mouth daily with breakfast.   carisoprodol 350 MG tablet Commonly known as:  SOMA Take 1 tablet (350 mg total) by mouth 4 (four) times daily as needed for muscle spasms.   CETIRIZINE-PSEUDOEPHEDRINE ER PO Take 1 tablet by mouth at bedtime. Cetirizine 2.5mg , pseudoephedrine    cyclobenzaprine 10 MG tablet Commonly known as:  FLEXERIL Take 20 mg by mouth at bedtime.   cycloSPORINE 0.05 % ophthalmic emulsion Commonly known as:  RESTASIS Place 1 drop into both eyes 2 (two) times daily.   desvenlafaxine 100 MG 24 hr tablet Commonly known as:  PRISTIQ Take 100 mg by mouth at bedtime.   docusate sodium 100 MG capsule Commonly known as:  COLACE Take 200 mg by mouth 2 (two) times daily.   esomeprazole 40 MG capsule Commonly known as:  NEXIUM Take 40 mg by mouth daily before breakfast.   ezetimibe 10 MG tablet Commonly known as:  ZETIA Take 10 mg by mouth at bedtime.   FLUoxetine 40 MG capsule Commonly known as:  PROZAC Take 40 mg by mouth daily with breakfast.   fluticasone 50 MCG/ACT nasal spray Commonly known as:  FLONASE Place 2 sprays into both nostrils 2 (two) times daily.   furosemide 40 MG tablet Commonly known as:  LASIX Take 40 mg by mouth 2 (two) times daily.   hydrochlorothiazide 12.5  MG capsule Commonly known as:  MICROZIDE Take 12.5 mg by mouth daily with breakfast.   HYDROmorphone 2 MG tablet Commonly known as:  DILAUDID Take 0.5-1 tablets (1-2 mg total) by mouth 2 (two) times daily.  in the am and  at bedtime.   hydrOXYzine 25 MG tablet Commonly known as:  ATARAX/VISTARIL Take 25 mg by mouth every 8 (eight) hours.   iron polysaccharides 150 MG capsule Commonly known as:  NIFEREX Take 150 mg by mouth daily with  breakfast.   lamoTRIgine 100 MG tablet Commonly known as:  LAMICTAL Take 100 mg by mouth at bedtime.   lisdexamfetamine 70 MG capsule Commonly known as:  VYVANSE Take 70 mg by mouth daily.   Loratadine 10 MG Caps Take 1 capsule by mouth daily.   lubiprostone 8 MCG capsule Commonly known as:  AMITIZA Take 8 mcg by mouth 2 (two) times daily with a meal.   meclizine 25 MG tablet Commonly known as:  ANTIVERT Take 25 mg by mouth at bedtime.   Melatonin 300 MCG Tabs Take 300 mcg by mouth daily.   potassium gluconate 595 (99 K) MG Tabs tablet Take 595 mg by mouth 2 (two) times daily.   pramipexole 0.5 MG tablet Commonly known as:  MIRAPEX Take 0.5 mg by mouth at bedtime.   pregabalin 150 MG capsule Commonly known as:  LYRICA Take 300 mg by mouth at bedtime.   PRILOSEC 20 MG capsule Generic drug:  omeprazole Take 20 mg by mouth at bedtime.   SYSTANE OP Place 1 drop into both eyes at bedtime.   VITAMIN C GUMMIE PO Take 1 tablet by mouth daily.   VITAMIN D3 ADULT GUMMIES 1000 units Chew Generic drug:  Cholecalciferol Chew 1,000 Units by mouth daily with breakfast.            Durable Medical Equipment        Start     Ordered   05/31/16 1620  DME Walker rolling  Once    Question:  Patient needs a walker to treat with the following condition  Answer:  Degenerative spondylolisthesis   05/31/16 1619   05/31/16 1620  DME 3 n 1  Once     05/31/16 1619       Signed: Zamari Bonsall A 06/02/2016, 9:39 AM

## 2016-06-02 NOTE — Discharge Instructions (Signed)

## 2016-06-02 NOTE — Progress Notes (Signed)
Pt doing well. Pt and wife given D/C instructions with Rx's, verbal understanding was provided. Pt's incision is clean and dry with no sign of infection. Pt's IV was removed prior to D/C. Pt D/C'd home via wheelchair @ 1145 per MD order. Pt is stable @ D/C and has no other needs at this time. Rema Fendt, RN

## 2016-06-02 NOTE — Progress Notes (Signed)
Physical Therapy Treatment Patient Details Name: [name] MRN: [id number] DOB: May 27, 1945 Today's Date: 06/02/2016    History of Present Illness 71 yo female s/p L1-2 fusion with PMH significant for HTN, vertigo, SOB, asthma, pneumonia, migraine, DVT, GERD, fibromyalgia, DJD, depression, bipolar disorder, anxiety.    PT Comments    This session was limited due to pt grogginess.  Stair training (backwards with RW) was completed so pt will be able to manage 1 step into home.  Pt initially used cane and appeared unsteady. Pt given RW and completed 300 feet ambulation with increased stability. Pt able to recall 3/3 back precautions and was educated on safety awareness with the RW.  Current recommendations remain appropriate, but feel it is necessary for pt to have assist initially upon return home for safety.   Follow Up Recommendations  Outpatient PT;Supervision for mobility/OOB     Equipment Recommendations  None recommended by PT    Recommendations for Other Services       Precautions / Restrictions Precautions Precautions: Back;Fall Precaution Comments: Pt able to recall 3/3 back precautions Required Braces or Orthoses: Spinal Brace Spinal Brace: Lumbar corset;Applied in sitting position Restrictions Weight Bearing Restrictions: No    Mobility  Bed Mobility               General bed mobility comments: Pt OOB in recliner at start of session  Transfers Overall transfer level: Needs assistance Equipment used: Straight cane;Rolling walker (2 wheeled) Transfers: Sit to/from Stand Sit to Stand: Supervision         General transfer comment: Cues required for precaution maintenance/upright posture and hand placement on seated surface.  Ambulation/Gait Ambulation/Gait assistance: Min guard;Min assist Ambulation Distance (Feet): 300 Feet Assistive device: Straight cane;Rolling walker (2 wheeled) Gait Pattern/deviations: Step-through pattern;Decreased stride  length;Trunk flexed;Wide base of support;Staggering left;Staggering right Gait velocity: fluctuating Gait velocity interpretation: at or above normal speed for age/gender General Gait Details: Initially pt ambulated with cane and appeared very unsteady and staggering.  Cued to pick up R foot with stepping.  Switched to RW and pt appeared steadier but still drifting L/R occasionally.  Heavy cueing required for walker proximity and to walk at a safer (slower pace).  Pt educated on using RW for steadying when she feels groggy.   Stairs Stairs: Yes   Stair Management: No rails;Step to pattern;Backwards;With walker Number of Stairs: 1 (practiced 3 times) General stair comments: Pt has ramped entrance before one step into house.  Pt instructed on stepping up with the good (L) first and pushing through arms for support while minimally bending.  First trial min assist but min guard assist for final 2 trials.  Pt instructed to keep a chair close by inside house to sit after managing stair with RW.  Wheelchair Mobility    Modified Rankin (Stroke Patients Only)       Balance Overall balance assessment: Needs assistance Sitting-balance support: Single extremity supported;Feet supported Sitting balance-Leahy Scale: Fair     Standing balance support: Single extremity supported;Bilateral upper extremity supported Standing balance-Leahy Scale: Poor Standing balance comment: RW better than cane for support                            Cognition Arousal/Alertness: Suspect due to medications Behavior During Therapy: WFL for tasks assessed/performed (slightly impulsive) Overall Cognitive Status: Within Functional Limits for tasks assessed  General Comments: Pt OOB in recliner at start of session, slumped to R side asleep. Pt cued to open eyes x2 at beginning of session.      Exercises      General Comments        Pertinent Vitals/Pain  Pain Assessment: Faces Faces Pain Scale: Hurts little more Pain Location: back Pain Descriptors / Indicators: Operative site guarding Pain Intervention(s): Premedicated before session;Monitored during session;Repositioned    Home Living                      Prior Function            PT Goals (current goals can now be found in the care plan section) Acute Rehab PT Goals Patient Stated Goal: to go home PT Goal Formulation: With patient Time For Goal Achievement: 06/08/16 Potential to Achieve Goals: Good Progress towards PT goals: Progressing toward goals    Frequency    Min 5X/week      PT Plan Current plan remains appropriate    Co-evaluation             End of Session Equipment Utilized During Treatment: Gait belt;Back brace Activity Tolerance: Patient tolerated treatment well Patient left: in chair;with call bell/phone within reach Nurse Communication: Mobility status PT Visit Diagnosis: Unsteadiness on feet (R26.81);Other abnormalities of gait and mobility (R26.89)     Time: 0829-0900 PT Time Calculation (min) (ACUTE ONLY): 31 min  Charges:                       G Codes:       [name] SPT   [name] 06/02/2016, 11:15 AM

## 2016-08-20 NOTE — Anesthesia Postprocedure Evaluation (Signed)
Anesthesia Post Note  Patient: [name]  Procedure(s) Performed: Procedure(s) (LRB): Posterior Lumbar One-Two Interbody and Fusion (N/A)     Anesthesia Post Evaluation  Last Vitals:  Vitals:   06/02/16 0015 06/02/16 0400  BP: (!) 100/55 (!) 104/52  Pulse: 72 74  Resp: 20 18  Temp: 37 C 37.1 C    Last Pain:  Vitals:   06/02/16 0900  TempSrc:   PainSc: 5                  Anmarie Fukushima EDWARD

## 2016-08-20 NOTE — Addendum Note (Signed)
Addendum  created 08/20/16 1133 by Hicks Feick, MD   Sign clinical note    

## 2016-10-13 ENCOUNTER — Other Ambulatory Visit: Payer: Self-pay | Admitting: Family Medicine

## 2016-10-13 DIAGNOSIS — R0602 Shortness of breath: Secondary | ICD-10-CM

## 2016-10-19 ENCOUNTER — Ambulatory Visit (HOSPITAL_COMMUNITY): Payer: Medicare Other | Attending: Cardiology

## 2016-10-19 ENCOUNTER — Other Ambulatory Visit: Payer: Self-pay

## 2016-10-19 DIAGNOSIS — R0600 Dyspnea, unspecified: Secondary | ICD-10-CM | POA: Diagnosis not present

## 2016-10-19 DIAGNOSIS — R0602 Shortness of breath: Secondary | ICD-10-CM | POA: Insufficient documentation

## 2017-02-08 DIAGNOSIS — I890 Lymphedema, not elsewhere classified: Secondary | ICD-10-CM

## 2017-02-08 HISTORY — DX: Lymphedema, not elsewhere classified: I89.0

## 2017-03-11 ENCOUNTER — Ambulatory Visit: Payer: Medicare Other | Admitting: Emergency Medicine

## 2017-03-11 ENCOUNTER — Ambulatory Visit (INDEPENDENT_AMBULATORY_CARE_PROVIDER_SITE_OTHER)
Admission: RE | Admit: 2017-03-11 | Discharge: 2017-03-11 | Disposition: A | Discharge status: Home / Self Care | Payer: Medicare Other | Source: Ambulatory Visit | Attending: Emergency Medicine | Admitting: Emergency Medicine

## 2017-03-11 ENCOUNTER — Encounter: Payer: Self-pay | Admitting: Emergency Medicine

## 2017-03-11 VITALS — BP 110/70 | HR 66 | Ht 63.0 in | Wt 244.0 lb

## 2017-03-11 DIAGNOSIS — R0600 Dyspnea, unspecified: Secondary | ICD-10-CM

## 2017-03-11 DIAGNOSIS — J383 Other diseases of vocal cords: Secondary | ICD-10-CM

## 2017-03-11 DIAGNOSIS — J301 Allergic rhinitis due to pollen: Secondary | ICD-10-CM | POA: Diagnosis not present

## 2017-03-11 NOTE — Assessment & Plan Note (Signed)
Progressive dyspnea on exertion.  Question contribution of deconditioning.  Given her progressive lower extremity edema need to consider cardiac causes, pulmonary hypertension.  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  She needs repeat study now.  We will also perform a chest x-ray.  I will review the results with her when available.

## 2017-03-11 NOTE — Assessment & Plan Note (Signed)
Controlled  Continue same regimen

## 2017-03-11 NOTE — Progress Notes (Signed)
72 yo former smoker, hx documented allergies and rhinitis, chronic cough.   ROV 03/11/17 --very pleasant 72 year old woman with a history of upper airway irritation syndrome and chronic cough in the setting of severe rhinitis and esophageal reflux.  She also had suspected obstructive lung disease.  We have followed her also in the past for stable pulmonary nodules and a chronically enlarged right hilar lymph node.  She returns today for follow-up.   She tells me that she experienced acute on chronic LE edema beginning about 2 weeks ago, she is seeing cardiology Dr Zorita PangVyssa, having her diuretics adjusted. She believes that it has worsened. She is also having severe knee pain. She describes progression of severe exertional dyspnea, even with just walking. She has heard some wheeze with exertion.her baseline cough has been stable.  She is using proair, increased to bid recently without much effect. She had a CXR last month - she was told no pulm edema.     Vitals:   03/11/17 1130  BP: 110/70  Pulse: 66  SpO2: 100%  Weight: 244 lb (110.7 kg)  Height: 5\' 3"  (1.6 m)   Gen: Pleasant, well-nourished, in no distress,  normal affect  ENT: No lesions,  mouth clear,  oropharynx clear, no postnasal drip  Neck: No JVD, no TMG, no carotid bruits  Lungs: No use of accessory muscles, few bilateral insp crackles.   Cardiovascular: RRR, heart sounds normal, no murmur or gallops, no peripheral edema  Musculoskeletal: No deformities, no cyanosis or clubbing  Neuro: alert, non focal  Skin: Warm, no lesions or rashes    CPST 07/18/12 --  Conclusion: Exercise testing with gas exchange demonstrates a normal functional capacity when compared to matched sedentary norms. There is a relative hyperventilation near peak exercise (relatively steep RCP), when VE/VCO2 is corrected to the RCP the slope remains significantly elevated at 37. The elevated VE/VCO2 slope and low HR response are concerning for an  underlying circulatory limitation as primary problem. The most common type in obese individuals is diastolic dysfunction. In addition, Air flow limitation and obesity could be contributing.   Allergic rhinitis Controlled.  Continue same regimen.  Vocal cord dysfunction She does still have cough but it appears to be at baseline with adequate allergy and GERD control.  Dyspnea Progressive dyspnea on exertion.  Question contribution of deconditioning.  Given her progressive lower extremity edema need to consider cardiac causes, pulmonary hypertension.  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  She needs repeat study now.  We will also perform a chest x-ray.  I will review the results with her when available.   Misty Pupaobert Byrum, MD, PhD 03/11/2017, 11:56 AM [facility] [phone] or if no answer [phone]

## 2017-03-11 NOTE — Patient Instructions (Addendum)
We will repeat your CXR today We will repeat your pulmonary function testing to compare with your priors.  We will perform walking oximetry today on room air.  Follow with Dr Delton CoombesByrum next available to review this testing.

## 2017-03-11 NOTE — Assessment & Plan Note (Signed)
She does still have cough but it appears to be at baseline with adequate allergy and GERD control.

## 2017-03-17 ENCOUNTER — Encounter: Payer: Self-pay | Admitting: Internal Medicine

## 2017-04-07 ENCOUNTER — Other Ambulatory Visit: Payer: Self-pay | Admitting: Emergency Medicine

## 2017-04-11 ENCOUNTER — Inpatient Hospital Stay: Payer: Medicare Other

## 2017-04-11 ENCOUNTER — Encounter: Payer: Self-pay | Admitting: Internal Medicine

## 2017-04-11 ENCOUNTER — Inpatient Hospital Stay: Payer: Medicare Other | Attending: Internal Medicine | Admitting: Internal Medicine

## 2017-04-11 VITALS — BP 156/76 | HR 87 | Temp 97.6°F | Resp 16 | Ht 63.0 in | Wt 251.7 lb

## 2017-04-11 DIAGNOSIS — D508 Other iron deficiency anemias: Secondary | ICD-10-CM

## 2017-04-11 DIAGNOSIS — F419 Anxiety disorder, unspecified: Secondary | ICD-10-CM | POA: Diagnosis not present

## 2017-04-11 DIAGNOSIS — D509 Iron deficiency anemia, unspecified: Secondary | ICD-10-CM

## 2017-04-11 DIAGNOSIS — Z86718 Personal history of other venous thrombosis and embolism: Secondary | ICD-10-CM | POA: Insufficient documentation

## 2017-04-11 DIAGNOSIS — I10 Essential (primary) hypertension: Secondary | ICD-10-CM | POA: Diagnosis not present

## 2017-04-11 DIAGNOSIS — Z79899 Other long term (current) drug therapy: Secondary | ICD-10-CM

## 2017-04-11 DIAGNOSIS — F329 Major depressive disorder, single episode, unspecified: Secondary | ICD-10-CM | POA: Diagnosis not present

## 2017-04-11 DIAGNOSIS — K219 Gastro-esophageal reflux disease without esophagitis: Secondary | ICD-10-CM | POA: Diagnosis not present

## 2017-04-11 LAB — CBC WITH DIFFERENTIAL (CANCER CENTER ONLY)
BASOS ABS: 0.1 10*3/uL (ref 0.0–0.1)
Basophils Relative: 1 %
Eosinophils Absolute: 0.3 10*3/uL (ref 0.0–0.5)
Eosinophils Relative: 3 %
HEMATOCRIT: 33.8 % — AB (ref 34.8–46.6)
Hemoglobin: 10.4 g/dL — ABNORMAL LOW (ref 11.6–15.9)
LYMPHS PCT: 21 %
Lymphs Abs: 2.1 10*3/uL (ref 0.9–3.3)
MCH: 22.9 pg — ABNORMAL LOW (ref 25.1–34.0)
MCHC: 30.8 g/dL — ABNORMAL LOW (ref 31.5–36.0)
MCV: 74.4 fL — AB (ref 79.5–101.0)
MONO ABS: 0.9 10*3/uL (ref 0.1–0.9)
Monocytes Relative: 9 %
NEUTROS ABS: 6.8 10*3/uL — AB (ref 1.5–6.5)
Neutrophils Relative %: 66 %
Platelet Count: 380 10*3/uL (ref 145–400)
RBC: 4.54 MIL/uL (ref 3.70–5.45)
RDW: 19.2 % — AB (ref 11.2–14.5)
WBC Count: 10.2 10*3/uL (ref 3.9–10.3)

## 2017-04-11 LAB — IRON AND TIBC
IRON: 27 ug/dL — AB (ref 41–142)
Saturation Ratios: 7 % — ABNORMAL LOW (ref 21–57)
TIBC: 408 ug/dL (ref 236–444)
UIBC: 381 ug/dL

## 2017-04-11 LAB — FERRITIN: FERRITIN: 19 ng/mL (ref 9–269)

## 2017-04-11 NOTE — Progress Notes (Signed)
[facility] [phone]   [phone]  CONSULT NOTE  REFERRING PHYSICIAN:Dr. Hamilton Capri  REASON FOR CONSULTATION:  72 years old white female with persistent iron deficiency anemia.  HPI Kyrie Bun is a 72 y.o. female with past medical history significant for multiple medical problems including history of anxiety/depression, bipolar disorder, left deep venous thrombosis in 2010, GERD, hypertension, vertigo, and dyslipidemia.  The patient was seen recently by her primary care physician complaining of increasing fatigue and weakness as well as shortness of breath.  She had cardiac workup including stress test that was unremarkable.  She is also seen by Dr. Delton Coombes from pulmonary medicine for evaluation of her breathing issues.  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  She also has a history of microcytic anemia for many years since she was in her 20s.  She has been tried on oral iron tablets in the past but she always have gastrointestinal bloating and intolerance to the oral iron tablet with black diarrhea.  She also has occasional dizzy spells in addition to the persistent fatigue but it is unclear if it is related to her anemia or the fibromyalgia and vertigo.  She had colonoscopy in 2015 by Dr. Loreta Ave and it showed a small polyp that was removed. She was referred to me today for evaluation of her persistent anemia and consideration of iron infusion. When seen today she denied having any chest pain but continues to have shortness of breath at baseline increased with exertion with cough productive of clear sputum and no hemoptysis.  She also has migraine headache with no visual changes.  She denied having any weight loss or night sweats.  She has no fever or chills.  She has no nausea, vomiting, diarrhea or constipation. Family history significant for mother with lung cancer, father had prostate cancer  and a half sister with history of cancer. The patient is married to a female partner.  She has no children.  She used to work as an Retail buyer.  She has a history for smoking for around 21 years and quit September 16, 1986.  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  The patient also has a history for smoking marijuana in the past.  HPI  Past Medical History:  Diagnosis Date  . Anxiety   . Asthma   . Bipolar affective disorder (HCC)   . Cough   . Depression   . DJD (degenerative joint disease) of lumbar spine   . DVT (deep venous thrombosis) (HCC) 2010   groin left - on coumadin for 6 months  . Family history of anesthesia complication    Hx: father has nausea  . Fibromyalgia   . GERD (gastroesophageal reflux disease)   . Hypertension    Hx: of in 2011  . Left leg DVT (HCC) 2010  . Migraine   . Pneumonia   . PONV (postoperative nausea and vomiting)    and migraines  . Shortness of breath    exertion  . Spinal cord stimulator dysfunction (HCC)    has one in,but not working  . Spinal headache   . Vertigo    Hx: of  . Vocal cord dysfunction    sees dr. Delton Coombes    Past Surgical History:  Procedure Laterality Date  . ANTERIOR LAT LUMBAR FUSION Left 02/15/2014   Procedure: ANTERIOR LATERAL LUMBAR INTERBODY FUSION LUMBAR TWO-THREE,LUMBAR THREE-FOUR  WITH LATERAL PLATE.;  Surgeon: Temple Pacini, MD;  Location: [facility];  Service: Neurosurgery;  Laterality: Left;  left   . APPENDECTOMY    . BACK SURGERY    . BREAST ENHANCEMENT SURGERY  1989  . CARPAL TUNNEL RELEASE  2006   Right hand  . CATARACT EXTRACTION W/ INTRAOCULAR LENS  IMPLANT, BILATERAL     Hx: of  . CERVICAL FUSION  2005  . CHOLECYSTECTOMY  1989  . COLONOSCOPY W/ BIOPSIES AND POLYPECTOMY     Hx: of  . FINGER ARTHROSCOPY WITH CARPOMETACARPEL (CMC) ARTHROPLASTY     thumb  . FRACTURE SURGERY Right 09/2013   ankle and leg - plate and screws  . LUMBAR LAMINECTOMY/DECOMPRESSION  MICRODISCECTOMY Right 11/06/2012   Procedure: LUMBAR TWO THREE LUMBAR LAMINECTOMY/DECOMPRESSION MICRODISCECTOMY 1 LEVEL;  Surgeon: Temple Pacini, MD;  Location: [facility];  Service: Neurosurgery;  Laterality: Right;  . PARTIAL HYSTERECTOMY  1980  . SHOULDER ARTHROSCOPY    . SPINAL CORD STIMULATOR IMPLANT  2010  . TARSAL SUSPENSION     Hx; of left thumb  . TONSILLECTOMY  1955  . UPPER GI ENDOSCOPY     Hx: of    Family History  Problem Relation Age of Onset  . Lung cancer Mother   . Prostate cancer Father   . Heart disease Father   . Rheum arthritis Paternal Grandmother   . Asthma Sister     Social History Social History   Tobacco Use  . Smoking status: Former Smoker    Packs/day: 2.50    Years: 21.00    Pack years: 52.50    Types: Cigarettes    Last attempt to quit: 09/16/1986    Years since quitting: 30.5  . Smokeless tobacco: Never Used  Substance Use Topics  . Alcohol use: No    Alcohol/week: 0.0 oz    Comment: recovering alcoholic sober since 1989  . Drug use: No    Allergies  Allergen Reactions  . Lithium Other (See Comments)    Migraines and "drunk"  . Codeine Nausea And Vomiting  . Lisinopril Other (See Comments)    kidney failure  . Scopolamine Other (See Comments)    Causes vertigo  . Statins Other (See Comments)    Very weak and achy  . Adhesive [Tape] Rash    All tape, paper and steri-strips included    Current Outpatient Medications  Medication Sig Dispense Refill  . albuterol (PROVENTIL HFA;VENTOLIN HFA) 108 (90 BASE) MCG/ACT inhaler Inhale 2 puffs into the lungs every 6 (six) hours as needed for wheezing.    Marland Kitchen ALPRAZolam (XANAX) 1 MG tablet Take 1-2 mg by mouth See admin instructions. Takes 1mg  at noon and 2mg  at bedtime.    . ARIPiprazole (ABILIFY) 30 MG tablet Take 30 mg by mouth at bedtime.  5  . Ascorbic Acid (VITAMIN C GUMMIE PO) Take 1 tablet by mouth daily.    . butalbital-acetaminophen-caffeine (FIORICET, ESGIC) 50-325-40 MG per tablet  Take 2 tablets by mouth every 4 (four) hours as needed for headache or migraine.     . calcitonin, salmon, (MIACALCIN/FORTICAL) 200 UNIT/ACT nasal spray Place 1 spray into alternate nostrils daily.    . calcium carbonate (OSCAL) 1500 (600 Ca) MG TABS tablet Take 600 mg of elemental calcium by mouth daily with breakfast.    . carisoprodol (SOMA) 350 MG tablet Take 1 tablet (350 mg total) by mouth 4 (four) times daily as needed for muscle spasms. 30 tablet 0  .  CETIRIZINE-PSEUDOEPHEDRINE ER PO Take 1 tablet by mouth at bedtime. Cetirizine 2.5mg , pseudoephedrine 60mg     . Cholecalciferol (VITAMIN D3 ADULT GUMMIES) 1000 UNITS CHEW Chew 1,000 Units by mouth daily with breakfast.     . cyclobenzaprine (FLEXERIL) 10 MG tablet Take 20 mg by mouth at bedtime.     . cycloSPORINE (RESTASIS) 0.05 % ophthalmic emulsion Place 1 drop into both eyes 2 (two) times daily.    Marland Kitchen. desvenlafaxine (PRISTIQ) 100 MG 24 hr tablet Take 100 mg by mouth at bedtime.     . docusate sodium (COLACE) 100 MG capsule Take 200 mg by mouth 2 (two) times daily.    Marland Kitchen. esomeprazole (NEXIUM) 40 MG capsule Take 40 mg by mouth daily before breakfast.      . ezetimibe (ZETIA) 10 MG tablet Take 10 mg by mouth at bedtime.    . fluticasone (FLONASE) 50 MCG/ACT nasal spray Place 2 sprays into both nostrils 2 (two) times daily. 16 g 11  . fluticasone (FLONASE) 50 MCG/ACT nasal spray USE 2 SPRAYS EACH NOSTRIL TWICE A DAY. 16 g 2  . furosemide (LASIX) 40 MG tablet Take 40 mg by mouth 2 (two) times daily.     Marland Kitchen. HYDROmorphone (DILAUDID) 2 MG tablet Take 0.5-1 tablets (1-2 mg total) by mouth 2 (two) times daily. 1mg  in the am and 2mg  at bedtime. 60 tablet 0  . hydrOXYzine (ATARAX/VISTARIL) 25 MG tablet Take 25 mg by mouth every 8 (eight) hours.     . iron polysaccharides (NIFEREX) 150 MG capsule Take 150 mg by mouth daily with breakfast.    . lamoTRIgine (LAMICTAL) 100 MG tablet Take 100 mg by mouth at bedtime.      Marland Kitchen. lisdexamfetamine (VYVANSE) 70 MG  capsule Take 70 mg by mouth daily.    . Loratadine 10 MG CAPS Take 1 capsule by mouth daily.      Marland Kitchen. lubiprostone (AMITIZA) 8 MCG capsule Take 8 mcg by mouth 2 (two) times daily with a meal.    . meclizine (ANTIVERT) 25 MG tablet Take 25 mg by mouth at bedtime.     . Melatonin 300 MCG TABS Take 300 mcg by mouth daily.     Marland Kitchen. omeprazole (PRILOSEC) 20 MG capsule Take 20 mg by mouth at bedtime.      Bertram Gala. Polyethyl Glycol-Propyl Glycol (SYSTANE OP) Place 1 drop into both eyes at bedtime.    . pramipexole (MIRAPEX) 0.5 MG tablet Take 0.5 mg by mouth at bedtime.      . pregabalin (LYRICA) 150 MG capsule Take 300 mg by mouth at bedtime.     No current facility-administered medications for this visit.     Review of Systems  Constitutional: positive for fatigue Eyes: negative Ears, nose, mouth, throat, and face: negative Respiratory: positive for cough and dyspnea on exertion Cardiovascular: negative Gastrointestinal: negative Genitourinary:negative Integument/breast: negative Hematologic/lymphatic: negative Musculoskeletal:negative Neurological: positive for vertigo Behavioral/Psych: negative Endocrine: negative Allergic/Immunologic: negative  Physical Exam  ZOX:WRUEARAL:alert, healthy, no distress, well nourished and well developed SKIN: skin color, texture, turgor are normal, no rashes or significant lesions HEAD: Normocephalic, No masses, lesions, tenderness or abnormalities EYES: normal, PERRLA, Conjunctiva are pink and non-injected EARS: External ears normal, Canals clear OROPHARYNX:no exudate, no erythema and lips, buccal mucosa, and tongue normal  NECK: supple, no adenopathy, no JVD LYMPH:  no palpable lymphadenopathy, no hepatosplenomegaly BREAST:not examined LUNGS: clear to auscultation , and palpation HEART: regular rate & rhythm, no murmurs and no gallops ABDOMEN:abdomen soft, non-tender, obese, normal bowel  sounds and no masses or organomegaly BACK: No CVA tenderness, Range of  motion is normal EXTREMITIES:no joint deformities, effusion, or inflammation, no edema, no skin discoloration  NEURO: alert & oriented x 3 with fluent speech, no focal motor/sensory deficits  PERFORMANCE STATUS: ECOG 1  LABORATORY DATA: Lab Results  Component Value Date   WBC 11.0 (H) 05/24/2016   HGB 12.4 05/24/2016   HCT 38.3 05/24/2016   MCV 78.5 05/24/2016   PLT 289 05/24/2016      Chemistry      Component Value Date/Time   NA 142 05/24/2016 1314   K 3.2 (L) 05/24/2016 1314   CL 98 (L) 05/24/2016 1314   CO2 29 05/24/2016 1314   BUN 18 05/24/2016 1314   CREATININE 0.91 05/24/2016 1314      Component Value Date/Time   CALCIUM 9.5 05/24/2016 1314   ALKPHOS 121 (H) 02/06/2014 1333   AST 21 02/06/2014 1333   ALT 17 02/06/2014 1333   BILITOT 0.3 02/06/2014 1333       RADIOGRAPHIC STUDIES: No results found.  ASSESSMENT: This is a very pleasant 72 years old white female with persistent microcytic anemia secondary to iron deficiency of unclear etiology probably secondary to poor intake.  The patient had gastrointestinal workup a few years ago that was unremarkable.  She probably will need to see her gastroenterologist soon for another evaluation.   PLAN: I had a lengthy discussion with the patient today about her condition and treatment options.  I recommended for her to have repeat CBC, iron study and ferritin today as a baseline. I would also consider the patient for treatment with Feraheme infusion 510 mg IV weekly for 2 doses.  For his dose April 15, 2017. I will arrange for the patient to come back for follow-up visit in 2 months for reevaluation after repeating CBC, iron study and ferritin for evaluation of her condition after the Feraheme infusion. She was also encouraged to increase the iron rich diet. For the other medical condition including fibromyalgia, hypertension and bipolar disorder, the patient will continue her routine follow-up visit and evaluation by her  primary care physician and psychiatric. She was advised to call immediately if she has any concerning symptoms in the interval. The patient voices understanding of current disease status and treatment options and is in agreement with the current care plan. All questions were answered. The patient knows to call the clinic with any problems, questions or concerns. We can certainly see the patient much sooner if necessary.  Thank you so much for allowing me to participate in the care of [employer]. I will continue to follow up the patient with you and assist in her care.  I spent 40 minutes counseling the patient face to face. The total time spent in the appointment was 60 minutes.  Disclaimer: This note was dictated with voice recognition software. Similar sounding words can inadvertently be transcribed and may not be corrected upon review.   Lajuana Matte April 11, 2017, 12:03 PM

## 2017-04-13 ENCOUNTER — Ambulatory Visit: Payer: Medicare Other | Admitting: Emergency Medicine

## 2017-04-13 ENCOUNTER — Ambulatory Visit (INDEPENDENT_AMBULATORY_CARE_PROVIDER_SITE_OTHER): Payer: Medicare Other | Admitting: Emergency Medicine

## 2017-04-13 ENCOUNTER — Encounter: Payer: Self-pay | Admitting: Emergency Medicine

## 2017-04-13 ENCOUNTER — Other Ambulatory Visit: Payer: Self-pay | Admitting: Cardiology

## 2017-04-13 DIAGNOSIS — R0602 Shortness of breath: Secondary | ICD-10-CM

## 2017-04-13 DIAGNOSIS — J383 Other diseases of vocal cords: Secondary | ICD-10-CM

## 2017-04-13 DIAGNOSIS — R0600 Dyspnea, unspecified: Secondary | ICD-10-CM | POA: Diagnosis not present

## 2017-04-13 DIAGNOSIS — I872 Venous insufficiency (chronic) (peripheral): Secondary | ICD-10-CM

## 2017-04-13 LAB — PULMONARY FUNCTION TEST
DL/VA % pred: 88 %
DL/VA: 4.07 ml/min/mmHg/L
DLCO cor % pred: 78 %
DLCO cor: 17.4 ml/min/mmHg
DLCO unc % pred: 69 %
DLCO unc: 15.56 ml/min/mmHg
FEF 25-75 Post: 1.23 L/sec
FEF 25-75 Pre: 1.47 L/sec
FEF2575-%Change-Post: -15 %
FEF2575-%Pred-Post: 68 %
FEF2575-%Pred-Pre: 80 %
FEV1-%Change-Post: -3 %
FEV1-%Pred-Post: 86 %
FEV1-%Pred-Pre: 89 %
FEV1-Post: 1.83 L
FEV1-Pre: 1.89 L
FEV1FVC-%Change-Post: 3 %
FEV1FVC-%Pred-Pre: 100 %
FEV6-%Change-Post: -6 %
FEV6-%Pred-Post: 86 %
FEV6-%Pred-Pre: 92 %
FEV6-Post: 2.32 L
FEV6-Pre: 2.48 L
FEV6FVC-%Pred-Post: 105 %
FEV6FVC-%Pred-Pre: 105 %
FVC-%Change-Post: -6 %
FVC-%Pred-Post: 82 %
FVC-%Pred-Pre: 88 %
FVC-Post: 2.32 L
FVC-Pre: 2.48 L
Post FEV1/FVC ratio: 79 %
Post FEV6/FVC ratio: 100 %
Pre FEV1/FVC ratio: 76 %
Pre FEV6/FVC Ratio: 100 %
RV % pred: 92 %
RV: 1.96 L
TLC % pred: 97 %
TLC: 4.7 L

## 2017-04-13 NOTE — Progress Notes (Signed)
72 yo former smoker, hx documented allergies and rhinitis, chronic cough.   ROV 03/11/17 --very pleasant 72 year old woman with a history of upper airway irritation syndrome and chronic cough in the setting of severe rhinitis and esophageal reflux.  She also had suspected obstructive lung disease.  We have followed her also in the past for stable pulmonary nodules and a chronically enlarged right hilar lymph node.  She returns today for follow-up.   She tells me that she experienced acute on chronic LE edema beginning about 2 weeks ago, she is seeing cardiology Dr Zorita Pang, having her diuretics adjusted. She believes that it has worsened. She is also having severe knee pain. She describes progression of severe exertional dyspnea, even with just walking. She has heard some wheeze with exertion.her baseline cough has been stable.  She is using proair, increased to bid recently without much effect. She had a CXR last month - she was told no pulm edema.   ROV 04/13/17 --follow-up visit today for history of chronic cough and upper airway irritation syndrome, exacerbated by severe rhinitis and esophageal reflux.  At her last visit she noted progressive exertional dyspnea even with just walking.  She has had some increased lower extremity edema as well.  She did not desaturate with ambulation on room air but had to stop due to dyspnea.  Chest x-ray from 03/11/17 did not show any acute infiltrates or pulmonary edema, there may have been some mild hyper inflation.   We performed pulmonary function testing today which I have reviewed.  This shows grossly normal airflows without a bronchodilator response.  There may be some mild curve to the flow volume loop that could suggest some mild obstruction.  Her lung volumes are normal.  Her diffusion capacity is slightly decreased and corrects to normal when adjusted for alveolar volume. She did a trial of Symbicort recently from Dr Conley Rolls, no real improvement in her breathing. She has  been found to have Fe-deficiency anemia, is going to start Fe infusions. She is also on lasix now for her LE edema. She doesn't snore much, no witnessed apneas.     Vitals:   04/13/17 1456 04/13/17 1500  BP:  112/76  Pulse:  92  SpO2:  92%  Weight: 252 lb (114.3 kg)   Height: 5' 2.5" (1.588 m)    Gen: Pleasant, well-nourished, in no distress,  normal affect  ENT: No lesions,  mouth clear,  oropharynx clear, no postnasal drip  Neck: No JVD, no TMG, no carotid bruits  Lungs: No use of accessory muscles, few bilateral insp crackles.   Cardiovascular: RRR, heart sounds normal, no murmur or gallops, no peripheral edema  Musculoskeletal: No deformities, no cyanosis or clubbing  Neuro: alert, non focal  Skin: Warm, no lesions or rashes    CPST 07/18/12 --  Conclusion: Exercise testing with gas exchange demonstrates a normal functional capacity when compared to matched sedentary norms. There is a relative hyperventilation near peak exercise (relatively steep RCP), when VE/VCO2 is corrected to the RCP the slope remains significantly elevated at 37. The elevated VE/VCO2 slope and low HR response are concerning for an underlying circulatory limitation as primary problem. The most common type in obese individuals is diastolic dysfunction. In addition, Air flow limitation and obesity could be contributing.   Dyspnea She remains unclear but her cardiac evaluation has been reassuring, now her pulmonary function testing is grossly normal.  She did not respond to empiric trial of Symbicort.  I question whether there may  be a contribution from her iron deficiency anemia.  I am most suspicious that this is deconditioning and restrictive disease from obesity.  She tells me that her thyroid function was tested within the last 6 months and was normal.  I will check a ventilation perfusion scan for completeness.  If this is reassuring then it may be that she will benefit the most from physical  therapy, physical conditioning.  Vocal cord dysfunction Stable at this time on a good GERD and rhinitis regimen.  Levy Pupaobert Isabela Nardelli, MD, PhD 04/13/2017, 3:26 PM [facility] [phone] or if no answer [phone]

## 2017-04-13 NOTE — Progress Notes (Signed)
Patient completed full PFT today. 

## 2017-04-13 NOTE — Assessment & Plan Note (Signed)
Stable at this time on a good GERD and rhinitis regimen.

## 2017-04-13 NOTE — Patient Instructions (Addendum)
We will perform a ventilation / perfusion scan Depending on your response to iron replacement infusions, we may decide to repeat your cardiopulmonary exercise testing in the future. Please continue nexium, flonase, omeprazole as you are taking them  Agree with starting Physical Therapy.  Follow with Dr Delton CoombesByrum in 3 months or sooner if you have any problems.

## 2017-04-13 NOTE — Assessment & Plan Note (Signed)
She remains unclear but her cardiac evaluation has been reassuring, now her pulmonary function testing is grossly normal.  She did not respond to empiric trial of Symbicort.  I question whether there may be a contribution from her iron deficiency anemia.  I am most suspicious that this is deconditioning and restrictive disease from obesity.  She tells me that her thyroid function was tested within the last 6 months and was normal.  I will check a ventilation perfusion scan for completeness.  If this is reassuring then it may be that she will benefit the most from physical therapy, physical conditioning.

## 2017-04-15 ENCOUNTER — Inpatient Hospital Stay: Payer: Medicare Other

## 2017-04-15 VITALS — BP 126/69 | HR 70 | Temp 97.5°F | Resp 17

## 2017-04-15 DIAGNOSIS — D508 Other iron deficiency anemias: Secondary | ICD-10-CM

## 2017-04-15 DIAGNOSIS — D509 Iron deficiency anemia, unspecified: Secondary | ICD-10-CM | POA: Diagnosis not present

## 2017-04-15 MED ORDER — FERUMOXYTOL INJECTION 510 MG/17 ML
510.0000 mg | Freq: Once | INTRAVENOUS | Status: AC
  Administered 2017-04-15: 510 mg via INTRAVENOUS
  Filled 2017-04-15: qty 17

## 2017-04-15 NOTE — Patient Instructions (Signed)

## 2017-04-19 ENCOUNTER — Encounter (HOSPITAL_COMMUNITY)
Admission: RE | Admit: 2017-04-19 | Discharge: 2017-04-19 | Disposition: A | Discharge status: Home / Self Care | Payer: Medicare Other | Source: Ambulatory Visit | Attending: Emergency Medicine | Admitting: Emergency Medicine

## 2017-04-19 ENCOUNTER — Ambulatory Visit (HOSPITAL_COMMUNITY)
Admission: RE | Admit: 2017-04-19 | Discharge: 2017-04-19 | Disposition: A | Discharge status: Home / Self Care | Payer: Medicare Other | Source: Ambulatory Visit | Attending: Emergency Medicine | Admitting: Emergency Medicine

## 2017-04-19 DIAGNOSIS — J449 Chronic obstructive pulmonary disease, unspecified: Secondary | ICD-10-CM | POA: Diagnosis not present

## 2017-04-19 DIAGNOSIS — R0602 Shortness of breath: Secondary | ICD-10-CM | POA: Diagnosis present

## 2017-04-19 DIAGNOSIS — R918 Other nonspecific abnormal finding of lung field: Secondary | ICD-10-CM | POA: Insufficient documentation

## 2017-04-19 MED ORDER — TECHNETIUM TO 99M ALBUMIN AGGREGATED
4.2000 | Freq: Once | INTRAVENOUS | Status: AC | PRN
  Administered 2017-04-19: 4.2 via INTRAVENOUS

## 2017-04-19 MED ORDER — TECHNETIUM TC 99M DIETHYLENETRIAME-PENTAACETIC ACID
31.8000 | Freq: Once | INTRAVENOUS | Status: AC | PRN
  Administered 2017-04-19: 31.8 via RESPIRATORY_TRACT

## 2017-04-22 ENCOUNTER — Inpatient Hospital Stay: Payer: Medicare Other

## 2017-04-22 ENCOUNTER — Other Ambulatory Visit: Payer: Self-pay | Admitting: Hematology and Oncology

## 2017-04-22 VITALS — BP 128/63 | HR 72 | Temp 98.2°F | Resp 18

## 2017-04-22 DIAGNOSIS — D508 Other iron deficiency anemias: Secondary | ICD-10-CM

## 2017-04-22 DIAGNOSIS — D509 Iron deficiency anemia, unspecified: Secondary | ICD-10-CM | POA: Diagnosis not present

## 2017-04-22 MED ORDER — SODIUM CHLORIDE 0.9 % IV SOLN
Freq: Once | INTRAVENOUS | Status: AC
Start: 2017-04-22 — End: 2017-04-22
  Administered 2017-04-22: 15:00:00 via INTRAVENOUS

## 2017-04-22 MED ORDER — SODIUM CHLORIDE 0.9 % IV SOLN
510.0000 mg | Freq: Once | INTRAVENOUS | Status: AC
  Administered 2017-04-22: 510 mg via INTRAVENOUS
  Filled 2017-04-22: qty 17

## 2017-04-22 NOTE — Patient Instructions (Signed)

## 2017-04-26 ENCOUNTER — Telehealth: Payer: Self-pay | Admitting: Emergency Medicine

## 2017-04-26 NOTE — Telephone Encounter (Signed)
Patient also will not be available today for a call back- she can be reached tomorrow at [phone]

## 2017-04-27 NOTE — Telephone Encounter (Signed)
Spoke with pt, she wants to know if RB reviewed her imaging because she states the results say she has COPD. She is starting to feel more SOB when she is walking and wants to know if there is anything else that can help her with her breathing. According to her medication list, she is not on a maintenance inhaler but does have a rescue inhaler. RB please advise next step.   Current Outpatient Medications on File Prior to Visit  Medication Sig Dispense Refill  . albuterol (PROVENTIL HFA;VENTOLIN HFA) 108 (90 BASE) MCG/ACT inhaler Inhale 2 puffs into the lungs every 6 (six) hours as needed for wheezing.    Marland Kitchen ALPRAZolam (XANAX) 1 MG tablet Take 1-2 mg by mouth See admin instructions. Takes 1mg  at noon and 2mg  at bedtime.    . ARIPiprazole (ABILIFY) 30 MG tablet Take 30 mg by mouth at bedtime.  5  . Ascorbic Acid (VITAMIN C GUMMIE PO) Take 1 tablet by mouth daily.    . butalbital-acetaminophen-caffeine (FIORICET, ESGIC) 50-325-40 MG per tablet Take 2 tablets by mouth every 4 (four) hours as needed for headache or migraine.     . calcitonin, salmon, (MIACALCIN/FORTICAL) 200 UNIT/ACT nasal spray Place 1 spray into alternate nostrils daily.    . calcium carbonate (OSCAL) 1500 (600 Ca) MG TABS tablet Take 600 mg of elemental calcium by mouth daily with breakfast.    . CETIRIZINE-PSEUDOEPHEDRINE ER PO Take 1 tablet by mouth at bedtime. Cetirizine 2.5mg , pseudoephedrine 60mg     . Cholecalciferol (VITAMIN D3 ADULT GUMMIES) 1000 UNITS CHEW Chew 1,000 Units by mouth daily with breakfast.     . ciclopirox (PENLAC) 8 % solution Apply 1 application topically at bedtime. Apply over nail and surrounding skin. Apply daily over previous coat. After seven (7) days, may remove with alcohol and continue cycle.    . cyclobenzaprine (FLEXERIL) 10 MG tablet Take 20 mg by mouth at bedtime.     . cycloSPORINE (RESTASIS) 0.05 % ophthalmic emulsion Place 1 drop into both eyes 2 (two) times daily.    Marland Kitchen desvenlafaxine (PRISTIQ) 100  MG 24 hr tablet Take 100 mg by mouth at bedtime.     . docusate sodium (COLACE) 100 MG capsule Take 200 mg by mouth 2 (two) times daily.    Marland Kitchen esomeprazole (NEXIUM) 40 MG capsule Take 40 mg by mouth daily before breakfast.      . ezetimibe (ZETIA) 10 MG tablet Take 10 mg by mouth at bedtime.    . fluticasone (FLONASE) 50 MCG/ACT nasal spray Place 2 sprays into both nostrils 2 (two) times daily. 16 g 11  . furosemide (LASIX) 40 MG tablet Take 40 mg by mouth 2 (two) times daily.     Marland Kitchen HYDROmorphone (DILAUDID) 2 MG tablet Take 0.5-1 tablets (1-2 mg total) by mouth 2 (two) times daily. 1mg  in the am and 2mg  at bedtime. 60 tablet 0  . hydrOXYzine (ATARAX/VISTARIL) 25 MG tablet Take 25 mg by mouth every 8 (eight) hours.     . iron polysaccharides (NIFEREX) 150 MG capsule Take 150 mg by mouth daily with breakfast.    . lamoTRIgine (LAMICTAL) 100 MG tablet Take 100 mg by mouth at bedtime.      Marland Kitchen lisdexamfetamine (VYVANSE) 70 MG capsule Take 70 mg by mouth daily.    . Loratadine 10 MG CAPS Take 1 capsule by mouth daily.      Marland Kitchen lubiprostone (AMITIZA) 8 MCG capsule Take 8 mcg by mouth 2 (two) times daily with a  meal.    . meclizine (ANTIVERT) 25 MG tablet Take 25 mg by mouth at bedtime.     . Melatonin 300 MCG TABS Take 300 mcg by mouth daily.     . mupirocin cream (BACTROBAN) 2 % Apply 1 application topically 3 (three) times daily.    . nitroGLYCERIN (NITROSTAT) 0.4 MG SL tablet Place 0.4 mg under the tongue every 5 (five) minutes as needed for chest pain.    Marland Kitchen. omeprazole (PRILOSEC) 20 MG capsule Take 20 mg by mouth at bedtime.      Bertram Gala. Polyethyl Glycol-Propyl Glycol (SYSTANE OP) Place 1 drop into both eyes at bedtime.    . Potassium Gluconate 595 MG CAPS Take 1 capsule by mouth 2 (two) times daily.    . pramipexole (MIRAPEX) 0.5 MG tablet Take 0.5 mg by mouth at bedtime.      . pregabalin (LYRICA) 150 MG capsule Take 300 mg by mouth at bedtime.    Marland Kitchen. spironolactone (ALDACTONE) 50 MG tablet Take 50 mg by  mouth daily.    Marland Kitchen. sulfamethoxazole-trimethoprim (BACTRIM DS) 800-160 MG tablet Take 1 tablet by mouth 2 (two) times daily.     No current facility-administered medications on file prior to visit.    Allergies  Allergen Reactions  . Lithium Other (See Comments)    Migraines and "drunk"  . Codeine Nausea And Vomiting  . Lisinopril Other (See Comments)    kidney failure  . Scopolamine Other (See Comments)    Causes vertigo  . Statins Other (See Comments)    Very weak and achy  . Adhesive [Tape] Rash    All tape, paper and steri-strips included

## 2017-04-28 NOTE — Telephone Encounter (Signed)
Pt is aware of below message and voiced her understanding.  Pt states she has reviewed results via mychart and at the end of the report, both PFT and cxr mention COPD.   Pt is concerned about this dx being mentioned as she is experiencing sob with exertion.   RB please advise. Thanks.

## 2017-04-28 NOTE — Telephone Encounter (Signed)
Lung function appears okay with FEV1 of 89% Diffusion also corrects to near normal levels. He does not need any maintenance medication at this time, Dr. Delton CoombesByrum can follow-up with detailed report

## 2017-04-28 NOTE — Telephone Encounter (Signed)
Sending to DOD, as RB is unavailable.   RA please advise.

## 2017-05-02 NOTE — Telephone Encounter (Signed)
We reviewed this at her office visit > her PFT are grossly normal, but with a suggestion of some MILD obstruction. Taken in conjunction with her CXR she likely does have some mild COPD. She has tried treatment with inhaled medications before without much impact on her symptoms. For this reason I have not asked her to take scheduled inhalers at this time.

## 2017-05-02 NOTE — Telephone Encounter (Signed)
Spoke with patient. She is aware of RB's recs. She verbalized understanding. Nothing else needed at time of call.

## 2017-05-03 ENCOUNTER — Inpatient Hospital Stay: Admission: RE | Admit: 2017-05-03 | Payer: Medicare Other | Source: Ambulatory Visit

## 2017-05-03 ENCOUNTER — Other Ambulatory Visit: Payer: Medicare Other

## 2017-05-06 ENCOUNTER — Inpatient Hospital Stay: Payer: Medicare Other

## 2017-05-06 DIAGNOSIS — D508 Other iron deficiency anemias: Secondary | ICD-10-CM

## 2017-05-06 DIAGNOSIS — D509 Iron deficiency anemia, unspecified: Secondary | ICD-10-CM | POA: Diagnosis not present

## 2017-05-06 LAB — CBC WITH DIFFERENTIAL (CANCER CENTER ONLY)
BASOS ABS: 0.1 10*3/uL (ref 0.0–0.1)
BASOS PCT: 1 %
Eosinophils Absolute: 0.4 10*3/uL (ref 0.0–0.5)
Eosinophils Relative: 4 %
HEMATOCRIT: 40 % (ref 34.8–46.6)
HEMOGLOBIN: 12.5 g/dL (ref 11.6–15.9)
Lymphocytes Relative: 23 %
Lymphs Abs: 2.2 10*3/uL (ref 0.9–3.3)
MCH: 25.2 pg (ref 25.1–34.0)
MCHC: 31.3 g/dL — ABNORMAL LOW (ref 31.5–36.0)
MCV: 80.5 fL (ref 79.5–101.0)
Monocytes Absolute: 0.9 10*3/uL (ref 0.1–0.9)
Monocytes Relative: 9 %
NEUTROS ABS: 5.9 10*3/uL (ref 1.5–6.5)
NEUTROS PCT: 63 %
Platelet Count: 305 10*3/uL (ref 145–400)
RBC: 4.97 MIL/uL (ref 3.70–5.45)
RDW: 24.1 % — ABNORMAL HIGH (ref 11.2–14.5)
WBC Count: 9.3 10*3/uL (ref 3.9–10.3)

## 2017-05-06 LAB — FERRITIN: Ferritin: 382 ng/mL — ABNORMAL HIGH (ref 9–269)

## 2017-05-06 LAB — IRON AND TIBC
IRON: 78 ug/dL (ref 41–142)
SATURATION RATIOS: 30 % (ref 21–57)
TIBC: 263 ug/dL (ref 236–444)
UIBC: 184 ug/dL

## 2017-05-20 ENCOUNTER — Inpatient Hospital Stay (HOSPITAL_BASED_OUTPATIENT_CLINIC_OR_DEPARTMENT_OTHER)
Admission: EM | Admit: 2017-05-20 | Discharge: 2017-05-22 | DRG: 603 | Disposition: A | Discharge status: Home / Self Care | Payer: Medicare Other | Attending: Internal Medicine | Admitting: Internal Medicine

## 2017-05-20 ENCOUNTER — Other Ambulatory Visit: Payer: Self-pay

## 2017-05-20 ENCOUNTER — Encounter (HOSPITAL_BASED_OUTPATIENT_CLINIC_OR_DEPARTMENT_OTHER): Payer: Self-pay | Admitting: Adult Health

## 2017-05-20 DIAGNOSIS — Z87891 Personal history of nicotine dependence: Secondary | ICD-10-CM

## 2017-05-20 DIAGNOSIS — Z9841 Cataract extraction status, right eye: Secondary | ICD-10-CM

## 2017-05-20 DIAGNOSIS — F419 Anxiety disorder, unspecified: Secondary | ICD-10-CM | POA: Diagnosis present

## 2017-05-20 DIAGNOSIS — Z86010 Personal history of colonic polyps: Secondary | ICD-10-CM | POA: Diagnosis not present

## 2017-05-20 DIAGNOSIS — L03116 Cellulitis of left lower limb: Principal | ICD-10-CM | POA: Diagnosis present

## 2017-05-20 DIAGNOSIS — Z90710 Acquired absence of both cervix and uterus: Secondary | ICD-10-CM

## 2017-05-20 DIAGNOSIS — Z961 Presence of intraocular lens: Secondary | ICD-10-CM | POA: Diagnosis present

## 2017-05-20 DIAGNOSIS — G8929 Other chronic pain: Secondary | ICD-10-CM | POA: Diagnosis present

## 2017-05-20 DIAGNOSIS — Z6841 Body Mass Index (BMI) 40.0 and over, adult: Secondary | ICD-10-CM | POA: Diagnosis not present

## 2017-05-20 DIAGNOSIS — W5503XA Scratched by cat, initial encounter: Secondary | ICD-10-CM | POA: Diagnosis not present

## 2017-05-20 DIAGNOSIS — L0390 Cellulitis, unspecified: Secondary | ICD-10-CM | POA: Diagnosis present

## 2017-05-20 DIAGNOSIS — G43909 Migraine, unspecified, not intractable, without status migrainosus: Secondary | ICD-10-CM | POA: Diagnosis present

## 2017-05-20 DIAGNOSIS — J383 Other diseases of vocal cords: Secondary | ICD-10-CM | POA: Diagnosis present

## 2017-05-20 DIAGNOSIS — Z8249 Family history of ischemic heart disease and other diseases of the circulatory system: Secondary | ICD-10-CM

## 2017-05-20 DIAGNOSIS — Z91048 Other nonmedicinal substance allergy status: Secondary | ICD-10-CM | POA: Diagnosis not present

## 2017-05-20 DIAGNOSIS — I10 Essential (primary) hypertension: Secondary | ICD-10-CM | POA: Diagnosis present

## 2017-05-20 DIAGNOSIS — Z801 Family history of malignant neoplasm of trachea, bronchus and lung: Secondary | ICD-10-CM | POA: Diagnosis not present

## 2017-05-20 DIAGNOSIS — F319 Bipolar disorder, unspecified: Secondary | ICD-10-CM | POA: Diagnosis present

## 2017-05-20 DIAGNOSIS — M797 Fibromyalgia: Secondary | ICD-10-CM | POA: Diagnosis present

## 2017-05-20 DIAGNOSIS — Z7951 Long term (current) use of inhaled steroids: Secondary | ICD-10-CM | POA: Diagnosis not present

## 2017-05-20 DIAGNOSIS — Z85828 Personal history of other malignant neoplasm of skin: Secondary | ICD-10-CM

## 2017-05-20 DIAGNOSIS — Z9049 Acquired absence of other specified parts of digestive tract: Secondary | ICD-10-CM | POA: Diagnosis not present

## 2017-05-20 DIAGNOSIS — Z9842 Cataract extraction status, left eye: Secondary | ICD-10-CM | POA: Diagnosis not present

## 2017-05-20 DIAGNOSIS — Z885 Allergy status to narcotic agent status: Secondary | ICD-10-CM

## 2017-05-20 DIAGNOSIS — Z981 Arthrodesis status: Secondary | ICD-10-CM | POA: Diagnosis not present

## 2017-05-20 DIAGNOSIS — E785 Hyperlipidemia, unspecified: Secondary | ICD-10-CM | POA: Diagnosis present

## 2017-05-20 DIAGNOSIS — Z8042 Family history of malignant neoplasm of prostate: Secondary | ICD-10-CM | POA: Diagnosis not present

## 2017-05-20 DIAGNOSIS — Z888 Allergy status to other drugs, medicaments and biological substances status: Secondary | ICD-10-CM | POA: Diagnosis not present

## 2017-05-20 DIAGNOSIS — J449 Chronic obstructive pulmonary disease, unspecified: Secondary | ICD-10-CM | POA: Diagnosis present

## 2017-05-20 DIAGNOSIS — Z825 Family history of asthma and other chronic lower respiratory diseases: Secondary | ICD-10-CM

## 2017-05-20 DIAGNOSIS — M7989 Other specified soft tissue disorders: Secondary | ICD-10-CM | POA: Diagnosis not present

## 2017-05-20 DIAGNOSIS — M48062 Spinal stenosis, lumbar region with neurogenic claudication: Secondary | ICD-10-CM | POA: Diagnosis present

## 2017-05-20 DIAGNOSIS — Z86718 Personal history of other venous thrombosis and embolism: Secondary | ICD-10-CM

## 2017-05-20 DIAGNOSIS — K219 Gastro-esophageal reflux disease without esophagitis: Secondary | ICD-10-CM | POA: Diagnosis present

## 2017-05-20 DIAGNOSIS — Z79891 Long term (current) use of opiate analgesic: Secondary | ICD-10-CM

## 2017-05-20 DIAGNOSIS — E669 Obesity, unspecified: Secondary | ICD-10-CM | POA: Diagnosis present

## 2017-05-20 DIAGNOSIS — M79609 Pain in unspecified limb: Secondary | ICD-10-CM | POA: Diagnosis not present

## 2017-05-20 LAB — CBC WITH DIFFERENTIAL/PLATELET
Basophils Absolute: 0 10*3/uL (ref 0.0–0.1)
Basophils Relative: 0 %
EOS ABS: 0.2 10*3/uL (ref 0.0–0.7)
Eosinophils Relative: 1 %
HCT: 38.2 % (ref 36.0–46.0)
Hemoglobin: 12.7 g/dL (ref 12.0–15.0)
LYMPHS ABS: 1.2 10*3/uL (ref 0.7–4.0)
Lymphocytes Relative: 7 %
MCH: 27 pg (ref 26.0–34.0)
MCHC: 33.2 g/dL (ref 30.0–36.0)
MCV: 81.1 fL (ref 78.0–100.0)
MONO ABS: 0.7 10*3/uL (ref 0.1–1.0)
Monocytes Relative: 4 %
NEUTROS PCT: 88 %
Neutro Abs: 14.9 10*3/uL — ABNORMAL HIGH (ref 1.7–7.7)
PLATELETS: 313 10*3/uL (ref 150–400)
RBC: 4.71 MIL/uL (ref 3.87–5.11)
RDW: 25.2 % — AB (ref 11.5–15.5)
WBC: 17 10*3/uL — AB (ref 4.0–10.5)

## 2017-05-20 LAB — BASIC METABOLIC PANEL
Anion gap: 10 (ref 5–15)
BUN: 14 mg/dL (ref 6–20)
CALCIUM: 8.8 mg/dL — AB (ref 8.9–10.3)
CO2: 25 mmol/L (ref 22–32)
Chloride: 101 mmol/L (ref 101–111)
Creatinine, Ser: 0.93 mg/dL (ref 0.44–1.00)
GFR calc Af Amer: >60 mL/min (ref >60)
Glucose, Bld: 91 mg/dL (ref 65–99)
POTASSIUM: 3.5 mmol/L (ref 3.5–5.1)
SODIUM: 136 mmol/L (ref 135–145)

## 2017-05-20 MED ORDER — VENLAFAXINE HCL ER 150 MG PO CP24
150.0000 mg | ORAL_CAPSULE | Freq: Every day | ORAL | Status: DC
  Administered 2017-05-21 – 2017-05-22 (×2): 150 mg via ORAL
  Filled 2017-05-20 (×2): qty 1

## 2017-05-20 MED ORDER — HYDROMORPHONE HCL 2 MG PO TABS
1.0000 mg | ORAL_TABLET | Freq: Every day | ORAL | Status: DC
  Administered 2017-05-21 – 2017-05-22 (×2): 1 mg via ORAL
  Filled 2017-05-20 (×2): qty 1

## 2017-05-20 MED ORDER — ACETAMINOPHEN 650 MG RE SUPP: 650.0000 mg | Freq: Four times a day (QID) | RECTAL | Status: DC | PRN

## 2017-05-20 MED ORDER — BUTALBITAL-APAP-CAFFEINE 50-325-40 MG PO TABS
2.0000 | ORAL_TABLET | Freq: Four times a day (QID) | ORAL | Status: DC | PRN
  Administered 2017-05-20 – 2017-05-21 (×2): 2 via ORAL
  Filled 2017-05-20 (×2): qty 2

## 2017-05-20 MED ORDER — ALBUTEROL SULFATE (2.5 MG/3ML) 0.083% IN NEBU: 2.5000 mg | INHALATION_SOLUTION | Freq: Four times a day (QID) | RESPIRATORY_TRACT | Status: DC | PRN

## 2017-05-20 MED ORDER — PRAMIPEXOLE DIHYDROCHLORIDE 0.25 MG PO TABS
0.5000 mg | ORAL_TABLET | Freq: Every day | ORAL | Status: DC
Start: 2017-05-20 — End: 2017-05-22
  Administered 2017-05-20 – 2017-05-21 (×2): 0.5 mg via ORAL
  Filled 2017-05-20 (×2): qty 2

## 2017-05-20 MED ORDER — ONDANSETRON HCL 4 MG/2ML IJ SOLN: 4.0000 mg | Freq: Four times a day (QID) | INTRAMUSCULAR | Status: DC | PRN

## 2017-05-20 MED ORDER — VANCOMYCIN HCL IN DEXTROSE 1-5 GM/200ML-% IV SOLN
1000.0000 mg | Freq: Once | INTRAVENOUS | Status: AC
  Administered 2017-05-20: 1000 mg via INTRAVENOUS
  Filled 2017-05-20: qty 200

## 2017-05-20 MED ORDER — SODIUM CHLORIDE 0.9 % IV SOLN
3.0000 g | Freq: Four times a day (QID) | INTRAVENOUS | Status: DC
  Administered 2017-05-21 – 2017-05-22 (×6): 3 g via INTRAVENOUS
  Filled 2017-05-20 (×8): qty 3

## 2017-05-20 MED ORDER — PANTOPRAZOLE SODIUM 40 MG PO TBEC
40.0000 mg | DELAYED_RELEASE_TABLET | Freq: Every day | ORAL | Status: DC
  Administered 2017-05-21 – 2017-05-22 (×2): 40 mg via ORAL
  Filled 2017-05-20 (×3): qty 1

## 2017-05-20 MED ORDER — ALBUTEROL SULFATE HFA 108 (90 BASE) MCG/ACT IN AERS: 2.0000 | INHALATION_SPRAY | Freq: Four times a day (QID) | RESPIRATORY_TRACT | Status: DC | PRN

## 2017-05-20 MED ORDER — PREGABALIN 100 MG PO CAPS
300.0000 mg | ORAL_CAPSULE | Freq: Every day | ORAL | Status: DC
Start: 2017-05-20 — End: 2017-05-22
  Administered 2017-05-20 – 2017-05-21 (×2): 300 mg via ORAL
  Filled 2017-05-20 (×2): qty 3

## 2017-05-20 MED ORDER — ALPRAZOLAM 1 MG PO TABS
1.0000 mg | ORAL_TABLET | Freq: Every day | ORAL | Status: DC
  Administered 2017-05-20 – 2017-05-21 (×2): 1 mg via ORAL
  Filled 2017-05-20 (×2): qty 1

## 2017-05-20 MED ORDER — FLUTICASONE PROPIONATE 50 MCG/ACT NA SUSP
2.0000 | Freq: Two times a day (BID) | NASAL | Status: DC
  Administered 2017-05-20 – 2017-05-21 (×3): 2 via NASAL
  Filled 2017-05-20: qty 16

## 2017-05-20 MED ORDER — EZETIMIBE 10 MG PO TABS
10.0000 mg | ORAL_TABLET | Freq: Every day | ORAL | Status: DC
  Administered 2017-05-20 – 2017-05-21 (×2): 10 mg via ORAL
  Filled 2017-05-20 (×2): qty 1

## 2017-05-20 MED ORDER — LAMOTRIGINE 100 MG PO TABS
100.0000 mg | ORAL_TABLET | Freq: Every day | ORAL | Status: DC
  Administered 2017-05-20: 100 mg via ORAL
  Filled 2017-05-20: qty 1

## 2017-05-20 MED ORDER — SPIRONOLACTONE 25 MG PO TABS
50.0000 mg | ORAL_TABLET | Freq: Every day | ORAL | Status: DC
  Administered 2017-05-21 – 2017-05-22 (×2): 50 mg via ORAL
  Filled 2017-05-20 (×3): qty 2

## 2017-05-20 MED ORDER — HYDROMORPHONE HCL 2 MG PO TABS
2.0000 mg | ORAL_TABLET | Freq: Every day | ORAL | Status: DC
  Administered 2017-05-20 – 2017-05-21 (×2): 2 mg via ORAL
  Filled 2017-05-20 (×2): qty 1

## 2017-05-20 MED ORDER — ENOXAPARIN SODIUM 40 MG/0.4ML ~~LOC~~ SOLN
40.0000 mg | Freq: Every day | SUBCUTANEOUS | Status: DC
  Administered 2017-05-20 – 2017-05-21 (×2): 40 mg via SUBCUTANEOUS
  Filled 2017-05-20 (×2): qty 0.4

## 2017-05-20 MED ORDER — SODIUM CHLORIDE 0.9 % IV SOLN
3.0000 g | Freq: Once | INTRAVENOUS | Status: AC
  Administered 2017-05-20: 3 g via INTRAVENOUS
  Filled 2017-05-20: qty 3

## 2017-05-20 MED ORDER — LISDEXAMFETAMINE DIMESYLATE 70 MG PO CAPS
70.0000 mg | ORAL_CAPSULE | Freq: Every day | ORAL | Status: DC
  Administered 2017-05-21: 70 mg via ORAL

## 2017-05-20 MED ORDER — ARIPIPRAZOLE 10 MG PO TABS
30.0000 mg | ORAL_TABLET | Freq: Every day | ORAL | Status: DC
  Administered 2017-05-20 – 2017-05-21 (×2): 30 mg via ORAL
  Filled 2017-05-20 (×2): qty 3

## 2017-05-20 MED ORDER — ACETAMINOPHEN 325 MG PO TABS
650.0000 mg | ORAL_TABLET | Freq: Four times a day (QID) | ORAL | Status: DC | PRN
  Administered 2017-05-21: 650 mg via ORAL
  Filled 2017-05-20: qty 2

## 2017-05-20 MED ORDER — ONDANSETRON HCL 4 MG PO TABS: 4.0000 mg | ORAL_TABLET | Freq: Four times a day (QID) | ORAL | Status: DC | PRN

## 2017-05-20 NOTE — ED Notes (Addendum)
Wick applied to Pt, Peri care done before wick was applied.

## 2017-05-20 NOTE — H&P (Signed)
History and Physical    [name] RUE:[id number] DOB: 10-05-1945 DOA: 05/20/2017  PCP: Lenell Antu, DO   Patient coming from: home  Chief Complaint: Left leg pain and swelling  HPI: [name] is a 72 y.o. female with medical history significant for bipolar affective disorder, fibromyalgia, lumber stenosis, COPD, DVT history, who presented to the ED today with complaints of left leg pain redness swelling with weeping of one day duration.  Patient reports yesterday she felt ill, but denies fever or chills, no vomiting or nausea.  She has COPD shortness of breath and cough are chronic and unchanged.  No dysuria or frequency. Patient had a skin biopsy ~1 month ago of the left lower extremity possible skin cancer.  She was called a week ago that the result is showing a staph infection.  Bactrim was prescribed, which she reports compliance with.  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   Patient's reports normally she keeps her bilateral lower extremities covered by >1 blanket to prevents scratches, as she has >25 cats, and >3 dogs.  Both 2-3 days ago she fell to cover her legs and she was scratched more than once by at least 2 cats.  ED Course: Temperature 99.8 in ED. otherwise stable vitals.  WBC- 17, K- 3.5, Na- 136.  Patient was started on IV Unasyn and vancomycin, as patient had failed outpatient therapy.  Hosptalist was called to admit for cellulitis.  Review of Systems: As per HPI otherwise 10 point review of systems negative.   Past Medical History:  Diagnosis Date  . Anxiety   . Asthma   . Bipolar affective disorder (HCC)   . Cough   . Depression   . DJD (degenerative joint disease) of lumbar spine   . DVT (deep venous thrombosis) (HCC) 2010   groin left - on coumadin for 6 months  . Family history of anesthesia complication    Hx: father has nausea  . Fibromyalgia   . GERD (gastroesophageal reflux disease)   . Hypertension    Hx: of in 2011  .  Left leg DVT (HCC) 2010  . Migraine   . Pneumonia   . PONV (postoperative nausea and vomiting)    and migraines  . Shortness of breath    exertion  . Spinal cord stimulator dysfunction (HCC)    has one in,but not working  . Spinal headache   . Vertigo    Hx: of  . Vocal cord dysfunction    sees dr. Delton Coombes    Past Surgical History:  Procedure Laterality Date  . ANTERIOR LAT LUMBAR FUSION Left 02/15/2014   Procedure: ANTERIOR LATERAL LUMBAR INTERBODY FUSION LUMBAR TWO-THREE,LUMBAR THREE-FOUR WITH LATERAL PLATE.;  Surgeon: Temple Pacini, MD;  Location: [facility];  Service: Neurosurgery;  Laterality: Left;  left   . APPENDECTOMY    . BACK SURGERY    . BREAST ENHANCEMENT SURGERY  1989  . CARPAL TUNNEL RELEASE  2006   Right hand  . CATARACT EXTRACTION W/ INTRAOCULAR LENS  IMPLANT, BILATERAL     Hx: of  . CERVICAL FUSION  2005  . CHOLECYSTECTOMY  1989  . COLONOSCOPY W/ BIOPSIES AND POLYPECTOMY     Hx: of  . FINGER ARTHROSCOPY WITH CARPOMETACARPEL (CMC) ARTHROPLASTY     thumb  . FRACTURE SURGERY Right 09/2013   ankle and leg - plate and screws  . LUMBAR LAMINECTOMY/DECOMPRESSION MICRODISCECTOMY Right 11/06/2012   Procedure: LUMBAR TWO THREE LUMBAR LAMINECTOMY/DECOMPRESSION MICRODISCECTOMY  1 LEVEL;  Surgeon: Temple PaciniHenry A Pool, MD;  Location: [facility];  Service: Neurosurgery;  Laterality: Right;  . PARTIAL HYSTERECTOMY  1980  . SHOULDER ARTHROSCOPY    . SPINAL CORD STIMULATOR IMPLANT  2010  . TARSAL SUSPENSION     Hx; of left thumb  . TONSILLECTOMY  1955  . UPPER GI ENDOSCOPY     Hx: of     reports that she quit smoking about 30 years ago. Her smoking use included cigarettes. She has a 52.50 pack-year smoking history. She has never used smokeless tobacco. She reports that she does not drink alcohol or use drugs.  Allergies  Allergen Reactions  . Lithium Other (See Comments)    Migraines and "drunk"  . Codeine Nausea And Vomiting  . Lisinopril Other (See Comments)    kidney  failure  . Scopolamine Other (See Comments)    Causes vertigo  . Statins Other (See Comments)    Very weak and achy  . Adhesive [Tape] Rash    All tape, paper and steri-strips included    Family History  Problem Relation Age of Onset  . Lung cancer Mother   . Prostate cancer Father   . Heart disease Father   . Rheum arthritis Paternal Grandmother   . Asthma Sister     Prior to Admission medications   Medication Sig Start Date End Date Taking? Authorizing Provider  albuterol (PROVENTIL HFA;VENTOLIN HFA) 108 (90 BASE) MCG/ACT inhaler Inhale 2 puffs into the lungs every 6 (six) hours as needed for wheezing.    [provider]  ALPRAZolam Prudy Feeler(XANAX) 1 MG tablet Take 1-2 mg by mouth See admin instructions. Takes 1mg  at noon and 2mg  at bedtime.    [provider]  ARIPiprazole (ABILIFY) 30 MG tablet Take 30 mg by mouth at bedtime. 05/04/16   [provider]  Ascorbic Acid (VITAMIN C GUMMIE PO) Take 1 tablet by mouth daily.    [provider]  butalbital-acetaminophen-caffeine (FIORICET, ESGIC) 50-325-40 MG per tablet Take 2 tablets by mouth every 4 (four) hours as needed for headache or migraine.     [provider]  calcitonin, salmon, (MIACALCIN/FORTICAL) 200 UNIT/ACT nasal spray Place 1 spray into alternate nostrils daily.    [provider]  calcium carbonate (OSCAL) 1500 (600 Ca) MG TABS tablet Take 600 mg of elemental calcium by mouth daily with breakfast.    [provider]  CETIRIZINE-PSEUDOEPHEDRINE ER PO Take 1 tablet by mouth at bedtime. Cetirizine 2.5mg , pseudoephedrine 60mg     [provider]  Cholecalciferol (VITAMIN D3 ADULT GUMMIES) 1000 UNITS CHEW Chew 1,000 Units by mouth daily with breakfast.     [provider]  ciclopirox (PENLAC) 8 % solution Apply 1 application topically at bedtime. Apply over nail and surrounding skin. Apply daily over previous coat. After seven (7) days, may remove with  alcohol and continue cycle.    [provider]  cyclobenzaprine (FLEXERIL) 10 MG tablet Take 20 mg by mouth at bedtime.     [provider]  cycloSPORINE (RESTASIS) 0.05 % ophthalmic emulsion Place 1 drop into both eyes 2 (two) times daily.    [provider]  desvenlafaxine (PRISTIQ) 100 MG 24 hr tablet Take 100 mg by mouth at bedtime.     [provider]  docusate sodium (COLACE) 100 MG capsule Take 200 mg by mouth 2 (two) times daily.    [provider]  esomeprazole (NEXIUM) 40 MG capsule Take 40 mg by mouth daily  before breakfast.      [provider]  ezetimibe (ZETIA) 10 MG tablet Take 10 mg by mouth at bedtime.    [provider]  fluticasone (FLONASE) 50 MCG/ACT nasal spray Place 2 sprays into both nostrils 2 (two) times daily. 12/05/13   Leslye Peer, MD  furosemide (LASIX) 40 MG tablet Take 40 mg by mouth 2 (two) times daily.     [provider]  HYDROmorphone (DILAUDID) 2 MG tablet Take 0.5-1 tablets (1-2 mg total) by mouth 2 (two) times daily. 1mg  in the am and 2mg  at bedtime. 06/02/16   Julio Sicks, MD  hydrOXYzine (ATARAX/VISTARIL) 25 MG tablet Take 25 mg by mouth every 8 (eight) hours.     [provider]  iron polysaccharides (NIFEREX) 150 MG capsule Take 150 mg by mouth daily with breakfast.    [provider]  lamoTRIgine (LAMICTAL) 100 MG tablet Take 100 mg by mouth at bedtime.      [provider]  lisdexamfetamine (VYVANSE) 70 MG capsule Take 70 mg by mouth daily.    [provider]  Loratadine 10 MG CAPS Take 1 capsule by mouth daily.      [provider]  lubiprostone (AMITIZA) 8 MCG capsule Take 8 mcg by mouth 2 (two) times daily with a meal.    [provider]  meclizine (ANTIVERT) 25 MG tablet Take 25 mg by mouth at bedtime.     [provider]  Melatonin 300 MCG TABS Take 300 mcg by mouth daily.     [provider]  mupirocin  cream (BACTROBAN) 2 % Apply 1 application topically 3 (three) times daily.    [provider]  nitroGLYCERIN (NITROSTAT) 0.4 MG SL tablet Place 0.4 mg under the tongue every 5 (five) minutes as needed for chest pain.    [provider]  omeprazole (PRILOSEC) 20 MG capsule Take 20 mg by mouth at bedtime.      [provider]  Polyethyl Glycol-Propyl Glycol (SYSTANE OP) Place 1 drop into both eyes at bedtime.    [provider]  Potassium Gluconate 595 MG CAPS Take 1 capsule by mouth 2 (two) times daily.    [provider]  pramipexole (MIRAPEX) 0.5 MG tablet Take 0.5 mg by mouth at bedtime.      [provider]  pregabalin (LYRICA) 150 MG capsule Take 300 mg by mouth at bedtime.    [provider]  spironolactone (ALDACTONE) 50 MG tablet Take 50 mg by mouth daily.    [provider]  sulfamethoxazole-trimethoprim (BACTRIM DS) 800-160 MG tablet Take 1 tablet by mouth 2 (two) times daily.    [provider]    Physical Exam: Vitals:   05/20/17 1805 05/20/17 1953 05/20/17 2035 05/20/17 2228  BP: 135/80  126/66 (!) 134/58  Pulse: 88  75 78  Resp: 20  20 19   Temp: 99.8 F (37.7 C)   98.4 F (36.9 C)  TempSrc: Oral   Oral  SpO2: 97%  100% 97%  Weight:  114.3 kg (252 lb)    Height:  5\' 4"  (1.626 m)      Constitutional: NAD, calm, comfortable Vitals:   05/20/17 1805 05/20/17 1953 05/20/17 2035 05/20/17 2228  BP: 135/80  126/66 (!) 134/58  Pulse: 88  75 78  Resp: 20  20 19   Temp: 99.8 F (37.7 C)   98.4 F (36.9 C)  TempSrc: Oral   Oral  SpO2: 97%  100% 97%  Weight:  114.3 kg (252 lb)    Height:  5\' 4"  (1.626 m)     Eyes: PERRL, lids and conjunctivae normal ENMT: Mucous membranes are moist. Posterior pharynx clear of any exudate or lesions.Normal dentition.  Neck: normal, supple, no masses, no thyromegaly Respiratory: clear to auscultation bilaterally, no wheezing, no crackles. Normal respiratory effort.  No accessory muscle use.  Cardiovascular: Regular rate and rhythm, no murmurs / rubs / gallops. +1 pitting pedal edema L > R. 2+ pedal pulses.   Abdomen: no tenderness, no masses palpated. No hepatosplenomegaly. Bowel sounds positive.  Musculoskeletal: no clubbing / cyanosis. Good ROM, no contractures. Normal muscle tone.  Left leg swollen, erythematous, with slight differential warmth, tenderness.  No purulence. biopsy site appears higher open lateral side of left extremity, appears well-healed.  1 scratch mark, scabed over. Could not fully evaluate for lymphadenopathy due to patient's habitus Skin: Redness or purulent LLE Neurologic: CN 2-12 grossly intact.  Strength 5/5 in all 4.  Psychiatric: Normal judgment and insight. Alert and oriented x 3. Normal mood.   Labs on Admission: I have personally reviewed following labs and imaging studies  CBC: Recent Labs  Lab 05/20/17 1848  WBC 17.0*  NEUTROABS 14.9*  HGB 12.7  HCT 38.2  MCV 81.1  PLT 313   Basic Metabolic Panel: Recent Labs  Lab 05/20/17 1848  NA 136  K 3.5  CL 101  CO2 25  GLUCOSE 91  BUN 14  CREATININE 0.93  CALCIUM 8.8*    WUJ:WJXB  Assessment/Plan Principal Problem:   Cellulitis Active Problems:   BIPOLAR AFFECTIVE DISORDER   Migraine headache   Lumbar stenosis with neurogenic claudication   COPD (chronic obstructive pulmonary disease) (HCC)   History of DVT (deep vein thrombosis)   Cellulitis- Left lower extremity.  Appears to be secondary to cat scratch.  Biopsy site appears well-healed, but cellulitic area appears to be more diffuse . WBC elevated at 17.  Patient rules out for sepsis.  IV Unasyn and IV vancomycin started in ED -Continue antibiotic coverage with IV Unasyn. - If no improvement consider adding Azithro for Cat scatch disease or consult ID ( Pt had being on bactrim) -Follow-up blood cultures drawn in ED - CBC a.m -Left lower extremity ultrasound  Bipolar affective disorder-reports being  on Xanax 2 mg every night -Cont home xanax at reduced dose 1 mg at night -Continue home Lamictal, vyvanse, venlafaxine  Lumbar stenosis with neurogenic claudication-reports being on Dilaudid 4 mg pm, 2 mg a.m, but this is not the dosing on med list -Continue Dilaudid 2 mg p.m. 1 mg a.m.  Polypharmacy  History of DVT  DVT prophylaxis: Lovenox Code Status: Full Family Communication:None at bedside Disposition Plan:  1-2 days Consults called: None Admission status: inpt, med surg   Onnie Boer MD [facility] Pager [phone] From 6PM-2AM.  Otherwise please contact night-coverage [email or web address] Password [facility]  05/20/2017, 10:45 PM

## 2017-05-20 NOTE — ED Triage Notes (Signed)
Presents with left lower leg erythema and weeping edema. She reports she had a biopsy done by the dermatologist one month ago. She has been taking Sulfa antibiotics for a staph infection from the biopsy that she began one week ago. Yesterday she reprots the leg became red, swollen and painful and began to weep. SHe has +2 pitting, weeping edema and erythema up to the knee. She endorses chills and subjective fever. Is not taking anything for fever. SHe took 4 lasix today to tryand lower the edema as well as her Butalbitol. HEr temp here is 99.8. SHe describes the pain as burning and severe.

## 2017-05-20 NOTE — ED Provider Notes (Signed)
[facility] EMERGENCY DEPARTMENT Provider Note   CSN: [id number] Arrival date & time: 05/20/17  1745     History   Chief Complaint Chief Complaint  Patient presents with  . Leg Pain    HPI [name] is a 72 y.o. female.  Patient is a 72 year old female with a history of DVT, lymphedema with bilateral chronic lower extremity swelling, asthma, bipolar disease who is presenting today with worsening left lower leg pain, swelling, erythema and weeping.  Patient states that she had a biopsy done a month ago for concern for skin cancer and was called by her dermatologist last week and told she had a staph infection in the biopsy.  She was placed on Bactrim but states yesterday the area became more red, swollen and started weeping.  She is also had chills and malaise.  She has not taken her temperature at home.  She also notes that she has 25+ cats at her home that are constantly running over her legs and they scratch her legs and cause them to week.  The history is provided by the patient.  Leg Pain   This is a new problem. The current episode started yesterday. The problem occurs constantly. The problem has been rapidly worsening. The pain is present in the left lower leg. The quality of the pain is described as aching and constant. The pain is at a severity of 6/10. The pain is moderate. Associated symptoms comments: Swelling, redness and drainage. Treatments tried: bactrim. The treatment provided no relief. There has been a history of trauma.    Past Medical History:  Diagnosis Date  . Anxiety   . Asthma   . Bipolar affective disorder (HCC)   . Cough   . Depression   . DJD (degenerative joint disease) of lumbar spine   . DVT (deep venous thrombosis) (HCC) 2010   groin left - on coumadin for 6 months  . Family history of anesthesia complication    Hx: father has nausea  . Fibromyalgia   . GERD (gastroesophageal reflux disease)   . Hypertension    Hx: of in 2011  .  Left leg DVT (HCC) 2010  . Migraine   . Pneumonia   . PONV (postoperative nausea and vomiting)    and migraines  . Shortness of breath    exertion  . Spinal cord stimulator dysfunction (HCC)    has one in,but not working  . Spinal headache   . Vertigo    Hx: of  . Vocal cord dysfunction    sees dr. Delton Coombes    Patient Active Problem List   Diagnosis Date Noted  . Iron deficiency anemia 04/11/2017  . Degenerative spondylolisthesis 05/31/2016  . Dyspnea 11/21/2015  . Scoliosis (and kyphoscoliosis), idiopathic 02/15/2014  . Lumbar stenosis with neurogenic claudication 02/15/2014  . Closed fracture of ankle 09/12/2013  . HNP (herniated nucleus pulposus), lumbar 11/06/2012  . Pulmonary nodule, right 10/07/2009  . Nonspecific (abnormal) findings on radiological and other examination of body structure 09/30/2009  . ABNORMAL CHEST XRAY 09/30/2009  . Allergic rhinitis 07/29/2009  . BIPOLAR AFFECTIVE DISORDER 07/28/2009  . MIGRAINE HEADACHE 07/28/2009  . G E R D 07/28/2009  . FIBROMYALGIA 07/28/2009  . Vocal cord dysfunction 07/28/2009    Past Surgical History:  Procedure Laterality Date  . ANTERIOR LAT LUMBAR FUSION Left 02/15/2014   Procedure: ANTERIOR LATERAL LUMBAR INTERBODY FUSION LUMBAR TWO-THREE,LUMBAR THREE-FOUR WITH LATERAL PLATE.;  Surgeon: Temple Pacini, MD;  Location: [facility];  Service: Neurosurgery;  Laterality: Left;  left   . APPENDECTOMY    . BACK SURGERY    . BREAST ENHANCEMENT SURGERY  1989  . CARPAL TUNNEL RELEASE  2006   Right hand  . CATARACT EXTRACTION W/ INTRAOCULAR LENS  IMPLANT, BILATERAL     Hx: of  . CERVICAL FUSION  2005  . CHOLECYSTECTOMY  1989  . COLONOSCOPY W/ BIOPSIES AND POLYPECTOMY     Hx: of  . FINGER ARTHROSCOPY WITH CARPOMETACARPEL (CMC) ARTHROPLASTY     thumb  . FRACTURE SURGERY Right 09/2013   ankle and leg - plate and screws  . LUMBAR LAMINECTOMY/DECOMPRESSION MICRODISCECTOMY Right 11/06/2012   Procedure: LUMBAR TWO THREE LUMBAR  LAMINECTOMY/DECOMPRESSION MICRODISCECTOMY 1 LEVEL;  Surgeon: Temple Pacini, MD;  Location: [facility];  Service: Neurosurgery;  Laterality: Right;  . PARTIAL HYSTERECTOMY  1980  . SHOULDER ARTHROSCOPY    . SPINAL CORD STIMULATOR IMPLANT  2010  . TARSAL SUSPENSION     Hx; of left thumb  . TONSILLECTOMY  1955  . UPPER GI ENDOSCOPY     Hx: of     OB History   None      Home Medications    Prior to Admission medications   Medication Sig Start Date End Date Taking? Authorizing Provider  albuterol (PROVENTIL HFA;VENTOLIN HFA) 108 (90 BASE) MCG/ACT inhaler Inhale 2 puffs into the lungs every 6 (six) hours as needed for wheezing.    [provider]  ALPRAZolam Prudy Feeler) 1 MG tablet Take 1-2 mg by mouth See admin instructions. Takes 1mg  at noon and 2mg  at bedtime.    [provider]  ARIPiprazole (ABILIFY) 30 MG tablet Take 30 mg by mouth at bedtime. 05/04/16   [provider]  Ascorbic Acid (VITAMIN C GUMMIE PO) Take 1 tablet by mouth daily.    [provider]  butalbital-acetaminophen-caffeine (FIORICET, ESGIC) 50-325-40 MG per tablet Take 2 tablets by mouth every 4 (four) hours as needed for headache or migraine.     [provider]  calcitonin, salmon, (MIACALCIN/FORTICAL) 200 UNIT/ACT nasal spray Place 1 spray into alternate nostrils daily.    [provider]  calcium carbonate (OSCAL) 1500 (600 Ca) MG TABS tablet Take 600 mg of elemental calcium by mouth daily with breakfast.    [provider]  CETIRIZINE-PSEUDOEPHEDRINE ER PO Take 1 tablet by mouth at bedtime. Cetirizine 2.5mg , pseudoephedrine 60mg     [provider]  Cholecalciferol (VITAMIN D3 ADULT GUMMIES) 1000 UNITS CHEW Chew 1,000 Units by mouth daily with breakfast.     [provider]  ciclopirox (PENLAC) 8 % solution Apply 1 application topically at bedtime. Apply over nail and surrounding skin. Apply daily over previous coat. After seven (7) days,  may remove with alcohol and continue cycle.    [provider]  cyclobenzaprine (FLEXERIL) 10 MG tablet Take 20 mg by mouth at bedtime.     [provider]  cycloSPORINE (RESTASIS) 0.05 % ophthalmic emulsion Place 1 drop into both eyes 2 (two) times daily.    [provider]  desvenlafaxine (PRISTIQ) 100 MG 24 hr tablet Take 100 mg by mouth at bedtime.     [provider]  docusate sodium (COLACE) 100 MG capsule Take 200 mg by mouth 2 (two) times daily.    [provider]  esomeprazole (NEXIUM) 40 MG capsule Take 40 mg by mouth daily before breakfast.      [provider]  ezetimibe (ZETIA) 10 MG tablet Take 10  mg by mouth at bedtime.    [provider]  fluticasone (FLONASE) 50 MCG/ACT nasal spray Place 2 sprays into both nostrils 2 (two) times daily. 12/05/13   Leslye PeerByrum, Robert S, MD  furosemide (LASIX) 40 MG tablet Take 40 mg by mouth 2 (two) times daily.     [provider]  HYDROmorphone (DILAUDID) 2 MG tablet Take 0.5-1 tablets (1-2 mg total) by mouth 2 (two) times daily. 1mg  in the am and 2mg  at bedtime. 06/02/16   Julio SicksPool, Henry, MD  hydrOXYzine (ATARAX/VISTARIL) 25 MG tablet Take 25 mg by mouth every 8 (eight) hours.     [provider]  iron polysaccharides (NIFEREX) 150 MG capsule Take 150 mg by mouth daily with breakfast.    [provider]  lamoTRIgine (LAMICTAL) 100 MG tablet Take 100 mg by mouth at bedtime.      [provider]  lisdexamfetamine (VYVANSE) 70 MG capsule Take 70 mg by mouth daily.    [provider]  Loratadine 10 MG CAPS Take 1 capsule by mouth daily.      [provider]  lubiprostone (AMITIZA) 8 MCG capsule Take 8 mcg by mouth 2 (two) times daily with a meal.    [provider]  meclizine (ANTIVERT) 25 MG tablet Take 25 mg by mouth at bedtime.     [provider]  Melatonin 300 MCG TABS Take 300 mcg by mouth daily.     [provider]  mupirocin cream (BACTROBAN) 2 % Apply 1 application topically 3 (three) times daily.    [provider]  nitroGLYCERIN (NITROSTAT) 0.4 MG SL tablet Place 0.4 mg under the tongue every 5 (five) minutes as needed for chest pain.    [provider]  omeprazole (PRILOSEC) 20 MG capsule Take 20 mg by mouth at bedtime.      [provider]  Polyethyl Glycol-Propyl Glycol (SYSTANE OP) Place 1 drop into both eyes at bedtime.    [provider]  Potassium Gluconate 595 MG CAPS Take 1 capsule by mouth 2 (two) times daily.    [provider]  pramipexole (MIRAPEX) 0.5 MG tablet Take 0.5 mg by mouth at bedtime.      [provider]  pregabalin (LYRICA) 150 MG capsule Take 300 mg by mouth at bedtime.    [provider]  spironolactone (ALDACTONE) 50 MG tablet Take 50 mg by mouth daily.    [provider]  sulfamethoxazole-trimethoprim (BACTRIM DS) 800-160 MG tablet Take 1 tablet by mouth 2 (two) times daily.    [provider]    Family History Family History  Problem Relation Age of Onset  . Lung cancer Mother   . Prostate cancer Father   . Heart disease Father   . Rheum arthritis Paternal Grandmother   . Asthma Sister     Social History Social History   Tobacco Use  . Smoking status: Former Smoker    Packs/day: 2.50    Years: 21.00    Pack years: 52.50    Types: Cigarettes    Last attempt to quit: 09/16/1986    Years since quitting: 30.6  . Smokeless tobacco: Never Used  Substance Use Topics  . Alcohol use: No    Alcohol/week: 0.0 oz    Comment: recovering alcoholic sober since 1989  . Drug use: No     Allergies   Lithium; Codeine; Lisinopril; Scopolamine; Statins; and Adhesive [tape]   Review of Systems Review of Systems  All other  systems reviewed and are negative.    Physical Exam Updated Vital Signs BP 135/80   Pulse 88   Temp 99.8 F (37.7 C) (Oral)   Resp 20   SpO2 97%   Physical  Exam  Constitutional: She is oriented to person, place, and time. She appears well-developed and well-nourished. No distress.  obese  HENT:  Head: Normocephalic and atraumatic.  Mouth/Throat: Oropharynx is clear and moist.  Eyes: Pupils are equal, round, and reactive to light. Conjunctivae and EOM are normal.  Neck: Normal range of motion. Neck supple.  Cardiovascular: Normal rate, regular rhythm and intact distal pulses.  No murmur heard. Pulmonary/Chest: Effort normal and breath sounds normal. No respiratory distress. She has no wheezes. She has no rales.  Abdominal: Soft. She exhibits no distension. There is no tenderness. There is no rebound and no guarding.  Musculoskeletal: Normal range of motion. She exhibits edema and tenderness.       Legs: Neurological: She is alert and oriented to person, place, and time.  Skin: Skin is warm and dry. No rash noted. No erythema.  Psychiatric: She has a normal mood and affect. Her behavior is normal.  Nursing note and vitals reviewed.    ED Treatments / Results  Labs (all labs ordered are listed, but only abnormal results are displayed) Labs Reviewed  CBC WITH DIFFERENTIAL/PLATELET - Abnormal; Notable for the following components:      Result Value   WBC 17.0 (*)    RDW 25.2 (*)    Neutro Abs 14.9 (*)    All other components within normal limits  BASIC METABOLIC PANEL - Abnormal; Notable for the following components:   Calcium 8.8 (*)    All other components within normal limits  CULTURE, BLOOD (ROUTINE X 2)  CULTURE, BLOOD (ROUTINE X 2)    EKG None  Radiology No results found.  Procedures Procedures (including critical care time)  Medications Ordered in ED Medications  vancomycin (VANCOCIN) IVPB 1000 mg/200 mL premix (has no administration in time range)     Initial Impression / Assessment and Plan / ED Course  I have reviewed the triage vital signs and the nursing notes.  Pertinent labs & imaging results that were  available during my care of the patient were reviewed by me and considered in my medical decision making (see chart for details).     Patient presenting today with evidence of worsening cellulitis despite being on Bactrim for over a week.  Patient has erythema, warmth extending up the left lower leg.  Also it is weeping in multiple areas.  However patient has known lymphedema which is most likely the cause of the weeping.  No sign of vascular issue.  Patient has a temperature of 99 but is otherwise stable.  Also patient has multiple cats in her home and concern for possible Pasteurella infection.  There is no signs of abscess at this time.  Will cover patient with vancomycin we will also consider Unasyn.  CBC, BMP and blood cultures pending.  Feel that patient will need admission for failed outpatient treatment for cellulitis.  7:46 PM Consistent with a leukocytosis of 17,000 and BMP with no significant changes.  Will admit for further care  Final Clinical Impressions(s) / ED Diagnoses   Final diagnoses:  Cellulitis of left lower extremity    ED Discharge Orders    None       Gwyneth Sprout, MD 05/20/17 2024

## 2017-05-21 ENCOUNTER — Inpatient Hospital Stay (HOSPITAL_COMMUNITY): Payer: Medicare Other

## 2017-05-21 DIAGNOSIS — M79609 Pain in unspecified limb: Secondary | ICD-10-CM

## 2017-05-21 DIAGNOSIS — J449 Chronic obstructive pulmonary disease, unspecified: Secondary | ICD-10-CM

## 2017-05-21 DIAGNOSIS — L03116 Cellulitis of left lower limb: Principal | ICD-10-CM

## 2017-05-21 DIAGNOSIS — Z86718 Personal history of other venous thrombosis and embolism: Secondary | ICD-10-CM

## 2017-05-21 DIAGNOSIS — M7989 Other specified soft tissue disorders: Secondary | ICD-10-CM

## 2017-05-21 LAB — CBC
HEMATOCRIT: 36.1 % (ref 36.0–46.0)
HEMOGLOBIN: 11.1 g/dL — AB (ref 12.0–15.0)
MCH: 25.6 pg — ABNORMAL LOW (ref 26.0–34.0)
MCHC: 30.7 g/dL (ref 30.0–36.0)
MCV: 83.4 fL (ref 78.0–100.0)
Platelets: 294 10*3/uL (ref 150–400)
RBC: 4.33 MIL/uL (ref 3.87–5.11)
RDW: 24.8 % — ABNORMAL HIGH (ref 11.5–15.5)
WBC: 14 10*3/uL — AB (ref 4.0–10.5)

## 2017-05-21 LAB — BLOOD CULTURE ID PANEL (REFLEXED)
Acinetobacter baumannii: NOT DETECTED
CANDIDA KRUSEI: NOT DETECTED
CANDIDA PARAPSILOSIS: NOT DETECTED
Candida albicans: NOT DETECTED
Candida glabrata: NOT DETECTED
Candida tropicalis: NOT DETECTED
ESCHERICHIA COLI: NOT DETECTED
Enterobacter cloacae complex: NOT DETECTED
Enterobacteriaceae species: NOT DETECTED
Enterococcus species: NOT DETECTED
Haemophilus influenzae: NOT DETECTED
KLEBSIELLA OXYTOCA: NOT DETECTED
KLEBSIELLA PNEUMONIAE: NOT DETECTED
LISTERIA MONOCYTOGENES: NOT DETECTED
Neisseria meningitidis: NOT DETECTED
PROTEUS SPECIES: NOT DETECTED
Pseudomonas aeruginosa: NOT DETECTED
SERRATIA MARCESCENS: NOT DETECTED
STAPHYLOCOCCUS AUREUS BCID: NOT DETECTED
STAPHYLOCOCCUS SPECIES: NOT DETECTED
STREPTOCOCCUS PYOGENES: NOT DETECTED
Streptococcus agalactiae: NOT DETECTED
Streptococcus pneumoniae: NOT DETECTED
Streptococcus species: NOT DETECTED

## 2017-05-21 MED ORDER — HYDROXYZINE HCL 25 MG PO TABS
50.0000 mg | ORAL_TABLET | Freq: Every day | ORAL | Status: DC
  Administered 2017-05-21: 50 mg via ORAL
  Filled 2017-05-21: qty 2

## 2017-05-21 MED ORDER — MECLIZINE HCL 25 MG PO TABS: 25.0000 mg | ORAL_TABLET | Freq: Three times a day (TID) | ORAL | Status: DC | PRN

## 2017-05-21 MED ORDER — CLINDAMYCIN PHOSPHATE 600 MG/50ML IV SOLN
600.0000 mg | Freq: Three times a day (TID) | INTRAVENOUS | Status: DC
  Administered 2017-05-21 – 2017-05-22 (×3): 600 mg via INTRAVENOUS
  Filled 2017-05-21 (×5): qty 50

## 2017-05-21 MED ORDER — MELATONIN 1 MG PO TABS
0.5000 mg | ORAL_TABLET | Freq: Every day | ORAL | Status: DC
  Administered 2017-05-21: 0.5 mg via ORAL
  Filled 2017-05-21: qty 1

## 2017-05-21 MED ORDER — HYDROXYZINE HCL 25 MG PO TABS
25.0000 mg | ORAL_TABLET | Freq: Every day | ORAL | Status: DC
  Administered 2017-05-22: 25 mg via ORAL
  Filled 2017-05-21: qty 1

## 2017-05-21 MED ORDER — LUBIPROSTONE 8 MCG PO CAPS
8.0000 ug | ORAL_CAPSULE | Freq: Two times a day (BID) | ORAL | Status: DC
  Administered 2017-05-21 – 2017-05-22 (×2): 8 ug via ORAL
  Filled 2017-05-21 (×3): qty 1

## 2017-05-21 MED ORDER — CYCLOBENZAPRINE HCL 10 MG PO TABS
20.0000 mg | ORAL_TABLET | Freq: Every day | ORAL | Status: DC
  Administered 2017-05-21: 20 mg via ORAL
  Filled 2017-05-21: qty 2

## 2017-05-21 MED ORDER — FLUTICASONE-UMECLIDIN-VILANT 100-62.5-25 MCG/INH IN AEPB: 1.0000 | INHALATION_SPRAY | Freq: Every day | RESPIRATORY_TRACT | Status: DC

## 2017-05-21 MED ORDER — FLUTICASONE FUROATE-VILANTEROL 100-25 MCG/INH IN AEPB
1.0000 | INHALATION_SPRAY | Freq: Every day | RESPIRATORY_TRACT | Status: DC
Start: 2017-05-22 — End: 2017-05-22
  Administered 2017-05-22: 1 via RESPIRATORY_TRACT
  Filled 2017-05-21: qty 28

## 2017-05-21 MED ORDER — LORATADINE 10 MG PO TABS
10.0000 mg | ORAL_TABLET | Freq: Every day | ORAL | Status: DC
  Administered 2017-05-21: 10 mg via ORAL
  Filled 2017-05-21: qty 1

## 2017-05-21 MED ORDER — VITAMIN D 1000 UNITS PO TABS
2000.0000 [IU] | ORAL_TABLET | Freq: Every day | ORAL | Status: DC
  Administered 2017-05-22: 2000 [IU] via ORAL
  Filled 2017-05-21: qty 2

## 2017-05-21 MED ORDER — VITAMIN B-12 1000 MCG PO TABS
1000.0000 ug | ORAL_TABLET | Freq: Every day | ORAL | Status: DC
  Administered 2017-05-22: 1000 ug via ORAL
  Filled 2017-05-21: qty 1

## 2017-05-21 MED ORDER — LAMOTRIGINE 100 MG PO TABS
300.0000 mg | ORAL_TABLET | Freq: Every day | ORAL | Status: DC
  Administered 2017-05-21: 300 mg via ORAL
  Filled 2017-05-21: qty 3

## 2017-05-21 MED ORDER — FUROSEMIDE 40 MG PO TABS
40.0000 mg | ORAL_TABLET | Freq: Four times a day (QID) | ORAL | Status: DC
  Administered 2017-05-21 – 2017-05-22 (×4): 40 mg via ORAL
  Filled 2017-05-21 (×4): qty 1

## 2017-05-21 MED ORDER — UMECLIDINIUM BROMIDE 62.5 MCG/INH IN AEPB
1.0000 | INHALATION_SPRAY | Freq: Every day | RESPIRATORY_TRACT | Status: DC
  Administered 2017-05-22: 08:00:00 1 via RESPIRATORY_TRACT
  Filled 2017-05-21: qty 7

## 2017-05-21 MED ORDER — DOCUSATE SODIUM 100 MG PO CAPS
200.0000 mg | ORAL_CAPSULE | Freq: Two times a day (BID) | ORAL | Status: DC
  Administered 2017-05-21: 100 mg via ORAL
  Administered 2017-05-22: 200 mg via ORAL
  Filled 2017-05-21 (×2): qty 2

## 2017-05-21 MED ORDER — PREGABALIN 100 MG PO CAPS
100.0000 mg | ORAL_CAPSULE | Freq: Every day | ORAL | Status: DC
  Administered 2017-05-22: 100 mg via ORAL
  Filled 2017-05-21: qty 1

## 2017-05-21 NOTE — Progress Notes (Signed)
VASCULAR LAB PRELIMINARY  PRELIMINARY  PRELIMINARY  PRELIMINARY  Left lower extremity venous duplex completed.    Preliminary report:  There is no DVT or SVT noted in the left lower extremity.  Enlarged inguinal lymph node noted.   Lucillia Corson, RVT 05/21/2017, 11:00 AM

## 2017-05-21 NOTE — Progress Notes (Signed)
PHARMACY - PHYSICIAN COMMUNICATION CRITICAL VALUE ALERT - BLOOD CULTURE IDENTIFICATION (BCID)  [name] is an 72 y.o. female [facility].  Assessment:  Cellulitis secondary to cat scratch.  Name of physician (or Provider) Contacted: X. Blount  Current antibiotics: Clindamycin, Unasyn  Changes to prescribed antibiotics recommended:  No changes to current abx.  Results for orders placed or performed during the hospital encounter of 05/20/17  Blood Culture ID Panel (Reflexed) (Collected: 05/20/2017  6:34 PM)  Result Value Ref Range   Enterococcus species NOT DETECTED NOT DETECTED   Listeria monocytogenes NOT DETECTED NOT DETECTED   Staphylococcus species NOT DETECTED NOT DETECTED   Staphylococcus aureus NOT DETECTED NOT DETECTED   Streptococcus species NOT DETECTED NOT DETECTED   Streptococcus agalactiae NOT DETECTED NOT DETECTED   Streptococcus pneumoniae NOT DETECTED NOT DETECTED   Streptococcus pyogenes NOT DETECTED NOT DETECTED   Acinetobacter baumannii NOT DETECTED NOT DETECTED   Enterobacteriaceae species NOT DETECTED NOT DETECTED   Enterobacter cloacae complex NOT DETECTED NOT DETECTED   Escherichia coli NOT DETECTED NOT DETECTED   Klebsiella oxytoca NOT DETECTED NOT DETECTED   Klebsiella pneumoniae NOT DETECTED NOT DETECTED   Proteus species NOT DETECTED NOT DETECTED   Serratia marcescens NOT DETECTED NOT DETECTED   Haemophilus influenzae NOT DETECTED NOT DETECTED   Neisseria meningitidis NOT DETECTED NOT DETECTED   Pseudomonas aeruginosa NOT DETECTED NOT DETECTED   Candida albicans NOT DETECTED NOT DETECTED   Candida glabrata NOT DETECTED NOT DETECTED   Candida krusei NOT DETECTED NOT DETECTED   Candida parapsilosis NOT DETECTED NOT DETECTED   Candida tropicalis NOT DETECTED NOT DETECTED    [name], [name] 05/21/2017  8:43 PM

## 2017-05-21 NOTE — Progress Notes (Signed)
PROGRESS NOTE    Misty FlossLizann Beazley  ZOX:[id number] DOB: Mar 23, 1945 DOA: 05/20/2017 PCP: Lenell AntuLe, Thao P, DO   Brief Narrative:  72 year old with past medical history relevant for Polar disorder, fibromyalgia, history of DVT, possible COPD with normal PFTs on 04/13/2017), chronic back pain who comes in with left lower extremity cellulitis in the setting of a cat scratch.   Assessment & Plan:   Principal Problem:   Cellulitis Active Problems:   BIPOLAR AFFECTIVE DISORDER   Migraine headache   Lumbar stenosis with neurogenic claudication   COPD (chronic obstructive pulmonary disease) (HCC)   History of DVT (deep vein thrombosis)   #) Cellulitis secondary to cat scratch: Patient has significant amounts of erythema, swelling in the area.  Her white count is quite elevated.  She has not had any fevers however her DVT ultrasound did show an inguinal lymph node. -Continue Unasyn started 05/20/2017 -Start IV clindamycin on 05/21/2017 -DVT ultrasound on 413 2019- for DVT -Blood cultures obtained on 05/20/2017 no growth to date  #) Bipolar 1 disorder: -Continue aripiprazole 30 mg nightly -Continue lamotrigine 300 mg nightly  #) Hyperlipidemia/hypertension: -Continue ezetimibe 10 mg nightly -Continue spironolactone 50 mg nightly  #)pain/psych: -Continue alprazolam 2 mg nightly, 1 mg daily as needed - Continue cyclobenzaprine 20 mg nightly - Continue hydromorphone 1 mg daily and 2 mg nightly -Continue PRN hydroxyzine -Continue lisdexamphetamine 70 mg every morning -Continue pregabalin 300 mg nightly and 100 mg daily -Continue venlafaxine 150 mg daily -Continue lubiprostone 8 mcg twice a day with meals  #) COPD with normal PFTs: -Continue ICS/L AMA/L ABA -PRN bronchodilators  #) Allergies: -Continue fluticasone -Continue loratadine 10 mg nightly  #) Periodic leg movement disorder: -Continue pramipexole 0.5 mg nightly  #) Chronic lower extremity edema: -Continue furosemide 40 mg 4 times  daily  #) Migraine headache: -Continue Fioricet 2 tablets every 6 hours as needed  Fluids: Tolerating p.o. Electrolytes: Monitor and support   nutrition: Regular diet  Prophylaxis: Enoxaparin  Disposition: Pending improvement of cellulitis  Full code     Consultants:   None  Procedures: (Don't include imaging studies which can be auto populated. Include things that cannot be auto populated i.e. Echo, Carotid and venous dopplers, Foley, Bipap, HD, tubes/drains, wound vac, central lines etc) 05/21/2017 DVT lower examinee ultrasound:There is no DVT or SVT noted in the left lower extremity.  Enlarged inguinal lymph node noted.       Antimicrobials: (specify start and planned stop date. Auto populated tables are space occupying and do not give end dates)  IV Unasyn started 05/20/2017  IV clindamycin started 05/21/2017   Subjective: Patient reports that she continues to have left lower extremity swelling and some erythema.  She also reports some pain in the area.  She denies any fevers or chills.  She denies any nausea, vomiting, abdominal pain, cough, no congestion.  Objective: Vitals:   05/20/17 1953 05/20/17 2035 05/20/17 2228 05/21/17 0504  BP:  126/66 (!) 134/58 107/66  Pulse:  75 78 71  Resp:  20 19 16   Temp:   98.4 F (36.9 C) 99.8 F (37.7 C)  TempSrc:   Oral Oral  SpO2:  100% 97% 90%  Weight: 114.3 kg (252 lb)     Height: 5\' 4"  (1.626 m)       Intake/Output Summary (Last 24 hours) at 05/21/2017 1322 Last data filed at 05/21/2017 0650 Gross per 24 hour  Intake 980 ml  Output 600 ml  Net 380 ml   [employer] Weights  05/20/17 1953  Weight: 114.3 kg (252 lb)    Examination:  General exam: Appears calm and comfortable  Respiratory system: Clear to auscultation. Respiratory effort normal. Cardiovascular system: Distant heart sounds, regular rate and rhythm Gastrointestinal system: Abdomen is soft, nondistended, nontender, plus bowel sounds. Central nervous  system: Alert and oriented. No focal neurological deficits. Extremities: Bilateral lower extremity edema, left leg up to mid knee is erythematous and swollen and warm and tender to the touch Skin:  left leg up to mid knee is erythematous and swollen and warm and tender to the touch Psychiatry: Judgement and insight appear normal. Mood & affect appropriate.     Data Reviewed: I have personally reviewed following labs and imaging studies  CBC: Recent Labs  Lab 05/20/17 1848  WBC 17.0*  NEUTROABS 14.9*  HGB 12.7  HCT 38.2  MCV 81.1  PLT 313   Basic Metabolic Panel: Recent Labs  Lab 05/20/17 1848  NA 136  K 3.5  CL 101  CO2 25  GLUCOSE 91  BUN 14  CREATININE 0.93  CALCIUM 8.8*   GFR: Estimated Creatinine Clearance: 69.8 mL/min (by C-G formula based on SCr of 0.93 mg/dL). Liver Function Tests: No results for input(s): AST, ALT, ALKPHOS, BILITOT, PROT, ALBUMIN in the last 168 hours. No results for input(s): LIPASE, AMYLASE in the last 168 hours. No results for input(s): AMMONIA in the last 168 hours. Coagulation Profile: No results for input(s): INR, PROTIME in the last 168 hours. Cardiac Enzymes: No results for input(s): CKTOTAL, CKMB, CKMBINDEX, TROPONINI in the last 168 hours. BNP (last 3 results) No results for input(s): PROBNP in the last 8760 hours. HbA1C: No results for input(s): HGBA1C in the last 72 hours. CBG: No results for input(s): GLUCAP in the last 168 hours. Lipid Profile: No results for input(s): CHOL, HDL, LDLCALC, TRIG, CHOLHDL, LDLDIRECT in the last 72 hours. Thyroid Function Tests: No results for input(s): TSH, T4TOTAL, FREET4, T3FREE, THYROIDAB in the last 72 hours. Anemia Panel: No results for input(s): VITAMINB12, FOLATE, FERRITIN, TIBC, IRON, RETICCTPCT in the last 72 hours. Sepsis Labs: No results for input(s): PROCALCITON, LATICACIDVEN in the last 168 hours.  No results found for this or any previous visit (from the past 240 hour(s)).        Radiology Studies: No results found.      Scheduled Meds: . ALPRAZolam  1 mg Oral QHS  . ARIPiprazole  30 mg Oral QHS  . enoxaparin (LOVENOX) injection  40 mg Subcutaneous QHS  . ezetimibe  10 mg Oral QHS  . fluticasone  2 spray Each Nare BID  . HYDROmorphone  1 mg Oral Daily  . HYDROmorphone  2 mg Oral QHS  . lamoTRIgine  300 mg Oral QHS  . lisdexamfetamine  70 mg Oral QAC breakfast  . pantoprazole  40 mg Oral Daily  . pramipexole  0.5 mg Oral QHS  . [START ON 05/22/2017] pregabalin  100 mg Oral Daily  . pregabalin  300 mg Oral QHS  . spironolactone  50 mg Oral Daily  . venlafaxine XR  150 mg Oral Q breakfast   Continuous Infusions: . ampicillin-sulbactam (UNASYN) IV 3 g (05/21/17 0919)  . clindamycin (CLEOCIN) IV       LOS: 1 day    Time spent: 35    Delaine Lame, MD [facility]   If 7PM-7AM, please contact night-coverage [email or web address] Password [email or web address] 05/21/2017, 1:22 PM

## 2017-05-22 LAB — CBC
HCT: 36 % (ref 36.0–46.0)
Hemoglobin: 11.2 g/dL — ABNORMAL LOW (ref 12.0–15.0)
MCH: 25.7 pg — ABNORMAL LOW (ref 26.0–34.0)
MCHC: 31.1 g/dL (ref 30.0–36.0)
MCV: 82.6 fL (ref 78.0–100.0)
Platelets: 247 10*3/uL (ref 150–400)
RBC: 4.36 MIL/uL (ref 3.87–5.11)
RDW: 24.2 % — ABNORMAL HIGH (ref 11.5–15.5)
WBC: 8 10*3/uL (ref 4.0–10.5)

## 2017-05-22 MED ORDER — CLINDAMYCIN HCL 150 MG PO CAPS: 600.0000 mg | ORAL_CAPSULE | Freq: Three times a day (TID) | ORAL | 0 refills | Status: AC

## 2017-05-22 MED ORDER — AMOXICILLIN-POT CLAVULANATE 875-125 MG PO TABS: 1.0000 | ORAL_TABLET | Freq: Two times a day (BID) | ORAL | 0 refills | Status: AC

## 2017-05-22 MED ORDER — LISDEXAMFETAMINE DIMESYLATE 50 MG PO CAPS: 70.0000 mg | ORAL_CAPSULE | Freq: Every day | ORAL | Status: DC

## 2017-05-22 NOTE — Discharge Instructions (Signed)

## 2017-05-22 NOTE — Discharge Summary (Signed)
Physician Discharge Summary  [name] JWJ:[id number] DOB: 26-Aug-1945 DOA: 05/20/2017  PCP: Lenell Antu, DO  Admit date: 05/20/2017 Discharge date: 05/22/2017  Admitted From: Home Disposition: Home  Recommendations for Outpatient Follow-up:  1. Follow up with PCP in 1-2 weeks 2. Please obtain BMP/CBC in one week   Home Health: No Equipment/Devices: No  Discharge Condition: Stable CODE STATUS: Full Diet recommendation: Heart Healthy  Brief/Interim Summary:  #) Cellulitis of left lower extremity secondary to cat scratch: Patient was admitted with elevated white count and significant cellulitis.  She was given IV Unasyn and clindamycin with significant improvement in her cellulitis.  On discharge her white count resolved.  Blood cultures obtained on 05/20/2017 are no growth to date.  DVT lower extremity ultrasound performed on 05/21/2017 was negative for any clot.  Patient was discharged home on oral Augmentin and oral clindamycin to complete a total of 7 days of antibiotics.  #)  bipolar 1 disorder: Patient was continued on home aripiprazole and lamotrigine.  #) Hypertension/hyperlipidemia: Patient was continued on Zetia and spironolactone.  #) Chronic pain/psych: Patient was continued on home alprazolam, cyclobenzaprine, hydromorphone, hydroxyzine, pregabalin, venlafaxine, lubiprostone.  #) COPD: Patient was continued on home inhalers.  #) Allergies: Patient was continued on home fluticasone and loratadine.  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  Discharge Diagnoses:  Principal Problem:   Cellulitis Active Problems:   BIPOLAR AFFECTIVE DISORDER   Migraine headache   Lumbar stenosis with neurogenic claudication   COPD (chronic obstructive pulmonary disease) (HCC)   History of DVT (deep vein thrombosis)    Discharge Instructions  Discharge Instructions    Call MD for:  difficulty breathing, headache or visual disturbances    Complete by:  As directed    Call MD for:  persistant nausea and vomiting   Complete by:  As directed    Call MD for:  redness, tenderness, or signs of infection (pain, swelling, redness, odor or green/yellow discharge around incision site)   Complete by:  As directed    Call MD for:  severe uncontrolled pain   Complete by:  As directed    Call MD for:  temperature >100.4   Complete by:  As directed    Diet - low sodium heart healthy   Complete by:  As directed    Discharge instructions   Complete by:  As directed    Please follow-up with your PCP in 1-2 weeks.  Please complete all of your antibiotics as prescribed.   Increase activity slowly   Complete by:  As directed      Allergies as of 05/22/2017      Reactions   Lithium Other (See Comments)   Migraines and "drunk"   Codeine Nausea And Vomiting   Lisinopril Other (See Comments)   kidney failure   Scopolamine Other (See Comments)   Causes vertigo   Statins Other (See Comments)   Very weak and achy   Adhesive [tape] Rash   All tape, paper and steri-strips included      Medication List    TAKE these medications   albuterol 108 (90 Base) MCG/ACT inhaler Commonly known as:  PROVENTIL HFA;VENTOLIN HFA Inhale 2 puffs into the lungs every 6 (six) hours as needed for wheezing.   ALPRAZolam 1 MG tablet Commonly known as:  XANAX Take 2 mg by mouth at bedtime. May take a dose of 1 mg during the day if needed for anxiety   amoxicillin-clavulanate 875-125 MG tablet  Commonly known as:  AUGMENTIN Take 1 tablet by mouth 2 (two) times daily for 6 days.   ARIPiprazole 30 MG tablet Commonly known as:  ABILIFY Take 30 mg by mouth at bedtime.   BACTRIM DS 800-160 MG tablet Generic drug:  sulfamethoxazole-trimethoprim Take 1 tablet by mouth 2 (two) times daily.   butalbital-acetaminophen-caffeine 50-325-40 MG tablet Commonly known as:  FIORICET, ESGIC Take 2 tablets by mouth every 4 (four) hours as needed for headache or  migraine.   calcitonin (salmon) 200 UNIT/ACT nasal spray Commonly known as:  MIACALCIN/FORTICAL Place 1 spray into alternate nostrils daily after breakfast.   calcium carbonate 1500 (600 Ca) MG Tabs tablet Commonly known as:  OSCAL Take 600 mg of elemental calcium by mouth daily with breakfast.   clindamycin 150 MG capsule Commonly known as:  CLEOCIN Take 4 capsules (600 mg total) by mouth 3 (three) times daily for 6 days.   cyclobenzaprine 10 MG tablet Commonly known as:  FLEXERIL Take 20 mg by mouth at bedtime.   cycloSPORINE 0.05 % ophthalmic emulsion Commonly known as:  RESTASIS Place 1 drop into both eyes 2 (two) times daily.   docusate sodium 100 MG capsule Commonly known as:  COLACE Take 200 mg by mouth 2 (two) times daily.   esomeprazole 40 MG capsule Commonly known as:  NEXIUM Take 40 mg by mouth daily before breakfast.   ezetimibe 10 MG tablet Commonly known as:  ZETIA Take 10 mg by mouth at bedtime.   fluticasone 50 MCG/ACT nasal spray Commonly known as:  FLONASE Place 2 sprays into both nostrils 2 (two) times daily.   furosemide 40 MG tablet Commonly known as:  LASIX Take 40 mg by mouth 4 (four) times daily.   HYDROmorphone 2 MG tablet Commonly known as:  DILAUDID Take 0.5-1 tablets (1-2 mg total) by mouth 2 (two) times daily. 1mg  in the am and 2mg  at bedtime. What changed:    how much to take  additional instructions   hydrOXYzine 25 MG tablet Commonly known as:  ATARAX/VISTARIL Take 25-50 mg by mouth 2 (two) times daily. 25 mg every morning, 50 mg every night   lamoTRIgine 150 MG tablet Commonly known as:  LAMICTAL Take 300 mg by mouth at bedtime.   lisdexamfetamine 70 MG capsule Commonly known as:  VYVANSE Take 70 mg by mouth daily after breakfast.   Loratadine 10 MG Caps Take 10 mg by mouth at bedtime.   lubiprostone 8 MCG capsule Commonly known as:  AMITIZA Take 8 mcg by mouth 2 (two) times daily with a meal.   meclizine 25 MG  tablet Commonly known as:  ANTIVERT Take 25 mg by mouth 3 (three) times daily as needed for dizziness.   Melatonin 300 MCG Tabs Take 300 mcg by mouth at bedtime.   mupirocin cream 2 % Commonly known as:  BACTROBAN Apply 1 application topically 3 (three) times daily.   nitroGLYCERIN 0.4 MG SL tablet Commonly known as:  NITROSTAT Place 0.4 mg under the tongue every 5 (five) minutes as needed for chest pain.   Potassium Gluconate 595 MG Caps Take 1 capsule by mouth 2 (two) times daily.   pramipexole 0.5 MG tablet Commonly known as:  MIRAPEX Take 0.5 mg by mouth at bedtime.   pregabalin 150 MG capsule Commonly known as:  LYRICA Take 300 mg by mouth at bedtime.   pregabalin 100 MG capsule Commonly known as:  LYRICA Take 100 mg by mouth daily after breakfast.   PRILOSEC 20  MG capsule Generic drug:  omeprazole Take 20 mg by mouth at bedtime.   spironolactone 50 MG tablet Commonly known as:  ALDACTONE Take 50 mg by mouth at bedtime.   SYSTANE OP Place 1 drop into both eyes at bedtime.   terbinafine 250 MG tablet Commonly known as:  LAMISIL Take 250 mg by mouth daily after breakfast.   TRELEGY ELLIPTA 100-62.5-25 MCG/INH Aepb Generic drug:  Fluticasone-Umeclidin-Vilant Inhale 1 puff into the lungs daily after breakfast.   vitamin B-12 1000 MCG tablet Commonly known as:  CYANOCOBALAMIN Take 1,000 mcg by mouth daily after breakfast.   VITAMIN C GUMMIE PO Take 1 tablet by mouth daily after breakfast.   Vitamin D 2000 units Caps Take 2,000 Units by mouth daily after breakfast.       Allergies  Allergen Reactions  . Lithium Other (See Comments)    Migraines and "drunk"  . Codeine Nausea And Vomiting  . Lisinopril Other (See Comments)    kidney failure  . Scopolamine Other (See Comments)    Causes vertigo  . Statins Other (See Comments)    Very weak and achy  . Adhesive [Tape] Rash    All tape, paper and steri-strips included     Consultations:  None   Procedures/Studies:  No results found. 05/21/2017 lower extremity ultrasound:Final Interpretation: Right: No evidence of common femoral vein obstruction. Left: There is no evidence of deep vein thrombosis in the lower extremity. However, portions of this examination were limited- see technologist comments above.There is no evidence of superficial venous thrombosis.   Subjective:   Discharge Exam: Vitals:   05/22/17 0738 05/22/17 1340  BP:  127/60  Pulse:  (!) 58  Resp:  17  Temp:  97.8 F (36.6 C)  SpO2: 94% 92%   Vitals:   05/21/17 2148 05/22/17 0604 05/22/17 0738 05/22/17 1340  BP: 114/84 (!) 147/75  127/60  Pulse: 65 60  (!) 58  Resp: 16 15  17   Temp: 98.2 F (36.8 C) (!) 97.5 F (36.4 C)  97.8 F (36.6 C)  TempSrc: Oral Oral  Oral  SpO2: 96% 91% 94% 92%  Weight:      Height:         General exam: Appears calm and comfortable  Respiratory system: Clear to auscultation. Respiratory effort normal. Cardiovascular system: Distant heart sounds, regular rate and rhythm Gastrointestinal system: Abdomen is soft, nondistended, nontender, plus bowel sounds. Central nervous system: Alert and oriented. No focal neurological deficits. Extremities: Bilateral lower extremity edema, mild erythema of the left distal leg, much improved Skin:  left leg up to mid knee is erythematous and swo only mildly llen and warm and tender to the touch Psychiatry: Judgement and insight appear normal. Mood & affect appropriate.     The results of significant diagnostics from this hospitalization (including imaging, microbiology, ancillary and laboratory) are listed below for reference.     Microbiology: Recent Results (from the past 240 hour(s))  Culture, blood (Routine X 2) w Reflex to ID Panel     Status: None (Preliminary result)   Collection Time: 05/20/17  6:34 PM  Result Value Ref Range Status   Specimen Description   Final    BLOOD RIGHT  ANTECUBITAL Performed at [facility], [address] [place], [place] [address]    Special Requests   Final    BOTTLES DRAWN AEROBIC AND ANAEROBIC Blood Culture adequate volume Performed at [facility] [address] [place], [place]  [id number]    Culture  Setup Time   Final    GRAM NEGATIVE RODS AEROBIC BOTTLE ONLY CRITICAL RESULT CALLED TO, READ BACK BY AND VERIFIED WITH: CSHADE,PHARMD @2043  05/21/17 BY LHOWARD Performed at [facility], [address] [place], [place] [address]    Culture GRAM NEGATIVE RODS  Final   Report Status PENDING  Incomplete  Blood Culture ID Panel (Reflexed)     Status: None   Collection Time: 05/20/17  6:34 PM  Result Value Ref Range Status   Enterococcus species NOT DETECTED NOT DETECTED Final   Listeria monocytogenes NOT DETECTED NOT DETECTED Final   Staphylococcus species NOT DETECTED NOT DETECTED Final   Staphylococcus aureus NOT DETECTED NOT DETECTED Final   Streptococcus species NOT DETECTED NOT DETECTED Final   Streptococcus agalactiae NOT DETECTED NOT DETECTED Final   Streptococcus pneumoniae NOT DETECTED NOT DETECTED Final   Streptococcus pyogenes NOT DETECTED NOT DETECTED Final   Acinetobacter baumannii NOT DETECTED NOT DETECTED Final   Enterobacteriaceae species NOT DETECTED NOT DETECTED Final   Enterobacter cloacae complex NOT DETECTED NOT DETECTED Final   Escherichia coli NOT DETECTED NOT DETECTED Final   Klebsiella oxytoca NOT DETECTED NOT DETECTED Final   Klebsiella pneumoniae NOT DETECTED NOT DETECTED Final   Proteus species NOT DETECTED NOT DETECTED Final   Serratia marcescens NOT DETECTED NOT DETECTED Final   Haemophilus influenzae NOT DETECTED NOT DETECTED Final   Neisseria meningitidis NOT DETECTED NOT DETECTED Final   Pseudomonas aeruginosa NOT DETECTED NOT DETECTED Final   Candida albicans NOT DETECTED NOT DETECTED Final   Candida glabrata NOT DETECTED NOT DETECTED Final   Candida krusei NOT  DETECTED NOT DETECTED Final   Candida parapsilosis NOT DETECTED NOT DETECTED Final   Candida tropicalis NOT DETECTED NOT DETECTED Final    Comment: Performed at [facility], [address] [place], [place] [address]  Culture, blood (Routine X 2) w Reflex to ID Panel     Status: None (Preliminary result)   Collection Time: 05/20/17  7:17 PM  Result Value Ref Range Status   Specimen Description   Final    BLOOD LEFT ANTECUBITAL Performed at [facility], [address] [place], [place] [address]    Special Requests   Final    BOTTLES DRAWN AEROBIC AND ANAEROBIC Blood Culture adequate volume Performed at [facility], [address] [place], [place] [address]    Culture  Setup Time   Final    GRAM NEGATIVE RODS AEROBIC BOTTLE ONLY CRITICAL VALUE NOTED.  VALUE IS CONSISTENT WITH PREVIOUSLY REPORTED AND CALLED VALUE. Performed at [facility], [address] [place], [place] [address]    Culture GRAM NEGATIVE RODS  Final   Report Status PENDING  Incomplete     Labs: BNP (last 3 results) No results for input(s): BNP in the last 8760 hours. Basic Metabolic Panel: Recent Labs  Lab 05/20/17 1848  NA 136  K 3.5  CL 101  CO2 25  GLUCOSE 91  BUN 14  CREATININE 0.93  CALCIUM 8.8*   Liver Function Tests: No results for input(s): AST, ALT, ALKPHOS, BILITOT, PROT, ALBUMIN in the last 168 hours. No results for input(s): LIPASE, AMYLASE in the last 168 hours. No results for input(s): AMMONIA in the last 168 hours. CBC: Recent Labs  Lab 05/20/17 1848 05/21/17 0419 05/22/17 0352  WBC 17.0* 14.0* 8.0  NEUTROABS 14.9*  --   --   HGB  12.7 11.1* 11.2*  HCT 38.2 36.1 36.0  MCV 81.1 83.4 82.6  PLT 313 294 247   Cardiac Enzymes: No results for input(s): CKTOTAL, CKMB, CKMBINDEX, TROPONINI in the last 168 hours. BNP: Invalid input(s): POCBNP CBG: No results for input(s): GLUCAP in the last 168 hours. D-Dimer No results for input(s): DDIMER  in the last 72 hours. Hgb A1c No results for input(s): HGBA1C in the last 72 hours. Lipid Profile No results for input(s): CHOL, HDL, LDLCALC, TRIG, CHOLHDL, LDLDIRECT in the last 72 hours. Thyroid function studies No results for input(s): TSH, T4TOTAL, T3FREE, THYROIDAB in the last 72 hours.  Invalid input(s): FREET3 Anemia work up No results for input(s): VITAMINB12, FOLATE, FERRITIN, TIBC, IRON, RETICCTPCT in the last 72 hours. Urinalysis No results found for: COLORURINE, APPEARANCEUR, LABSPEC, PHURINE, GLUCOSEU, HGBUR, BILIRUBINUR, KETONESUR, PROTEINUR, UROBILINOGEN, NITRITE, LEUKOCYTESUR Sepsis Labs Invalid input(s): PROCALCITONIN,  WBC,  LACTICIDVEN Microbiology Recent Results (from the past 240 hour(s))  Culture, blood (Routine X 2) w Reflex to ID Panel     Status: None (Preliminary result)   Collection Time: 05/20/17  6:34 PM  Result Value Ref Range Status   Specimen Description   Final    BLOOD RIGHT ANTECUBITAL Performed at [facility], [address] [place], [place] [address]    Special Requests   Final    BOTTLES DRAWN AEROBIC AND ANAEROBIC Blood Culture adequate volume Performed at [facility], [address] [place], [place] [address]    Culture  Setup Time   Final    GRAM NEGATIVE RODS AEROBIC BOTTLE ONLY CRITICAL RESULT CALLED TO, READ BACK BY AND VERIFIED WITH: CSHADE,PHARMD @2043  05/21/17 BY LHOWARD Performed at [facility], [address] [place], [place] [address]    Culture GRAM NEGATIVE RODS  Final   Report Status PENDING  Incomplete  Blood Culture ID Panel (Reflexed)     Status: None   Collection Time: 05/20/17  6:34 PM  Result Value Ref Range Status   Enterococcus species NOT DETECTED NOT DETECTED Final   Listeria monocytogenes NOT DETECTED NOT DETECTED Final   Staphylococcus species NOT DETECTED NOT DETECTED Final   Staphylococcus aureus NOT DETECTED NOT DETECTED Final   Streptococcus species NOT DETECTED NOT  DETECTED Final   Streptococcus agalactiae NOT DETECTED NOT DETECTED Final   Streptococcus pneumoniae NOT DETECTED NOT DETECTED Final   Streptococcus pyogenes NOT DETECTED NOT DETECTED Final   Acinetobacter baumannii NOT DETECTED NOT DETECTED Final   Enterobacteriaceae species NOT DETECTED NOT DETECTED Final   Enterobacter cloacae complex NOT DETECTED NOT DETECTED Final   Escherichia coli NOT DETECTED NOT DETECTED Final   Klebsiella oxytoca NOT DETECTED NOT DETECTED Final   Klebsiella pneumoniae NOT DETECTED NOT DETECTED Final   Proteus species NOT DETECTED NOT DETECTED Final   Serratia marcescens NOT DETECTED NOT DETECTED Final   Haemophilus influenzae NOT DETECTED NOT DETECTED Final   Neisseria meningitidis NOT DETECTED NOT DETECTED Final   Pseudomonas aeruginosa NOT DETECTED NOT DETECTED Final   Candida albicans NOT DETECTED NOT DETECTED Final   Candida glabrata NOT DETECTED NOT DETECTED Final   Candida krusei NOT DETECTED NOT DETECTED Final   Candida parapsilosis NOT DETECTED NOT DETECTED Final   Candida tropicalis NOT DETECTED NOT DETECTED Final    Comment: Performed at [facility], [address], [place], [place] [address]  Culture, blood (Routine X 2) w Reflex to ID Panel     Status: None (Preliminary result)  Collection Time: 05/20/17  7:17 PM  Result Value Ref Range Status   Specimen Description   Final    BLOOD LEFT ANTECUBITAL Performed at [facility], [address] [place], [place] [address]    Special Requests   Final    BOTTLES DRAWN AEROBIC AND ANAEROBIC Blood Culture adequate volume Performed at [facility], [address] [place], [place] [address]    Culture  Setup Time   Final    GRAM NEGATIVE RODS AEROBIC BOTTLE ONLY CRITICAL VALUE NOTED.  VALUE IS CONSISTENT WITH PREVIOUSLY REPORTED AND CALLED VALUE. Performed at [facility], [address] [place], [place] [address]    Culture GRAM NEGATIVE RODS  Final   Report  Status PENDING  Incomplete     Time coordinating discharge: Over 30 minutes  SIGNED:   Delaine Lame, MD  [facility] 05/22/2017, 1:42 PM  If 7PM-7AM, please contact night-coverage [email or web address] Password [email or web address]

## 2017-05-23 LAB — CULTURE, BLOOD (ROUTINE X 2)
SPECIAL REQUESTS: ADEQUATE
Special Requests: ADEQUATE

## 2017-05-24 ENCOUNTER — Ambulatory Visit
Admission: RE | Admit: 2017-05-24 | Discharge: 2017-05-24 | Disposition: A | Discharge status: Home / Self Care | Payer: Medicare Other | Source: Ambulatory Visit | Attending: Cardiology | Admitting: Cardiology

## 2017-05-24 DIAGNOSIS — I872 Venous insufficiency (chronic) (peripheral): Secondary | ICD-10-CM

## 2017-05-24 NOTE — Consult Note (Addendum)
Chief Complaint: Patient was seen in consultation today for  Chief Complaint  Patient presents with  . Leg Pain   at the request of Dr. Florian Buff  Referring Physician(s): Dr. Florian Buff  History of Present Illness: [name] is a 72 y.o. 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  This is improved after initiating furosemide in January.  She describes bilateral lower extremity burning, throbbing, aching, and edema.  History of left DVT 8 years ago, treated with a short course of Coumadin which is since been discontinued, without complicating features.  No history of varicose or spider vein treatment.  There is a family history of spider veins in her father.  She had a recent antibiotic treatment for left lower extremity cellulitis which is improving.  She is G1 P0.  She does not use graduated compression hose.  She  sits for long periods of time up to 10 hours a day.  She uses Dilaudid x5 years for chronic pain.  No history of lower extremity ulceration.  She has had a history of shortness of breath times 3 years.  Frequent wheezing.      Past Medical History:  Diagnosis Date  . Anxiety   . Asthma   . Bipolar affective disorder (HCC)   . Cough   . Depression   . DJD (degenerative joint disease) of lumbar spine   . DVT (deep venous thrombosis) (HCC) 2010   groin left - on coumadin for 6 months  . Family history of anesthesia complication    Hx: father has nausea  . Fibromyalgia   . GERD (gastroesophageal reflux disease)   . Hypertension    Hx: of in 2011  . Left leg DVT (HCC) 2010  . Migraine   . Pneumonia   . PONV (postoperative nausea and vomiting)    and migraines  . Shortness of breath    exertion  . Spinal cord stimulator dysfunction (HCC)    has one in,but not working  . Spinal headache   . Vertigo    Hx: of  . Vocal cord dysfunction    sees dr. Delton Coombes    Past  Surgical History:  Procedure Laterality Date  . ANTERIOR LAT LUMBAR FUSION Left 02/15/2014   Procedure: ANTERIOR LATERAL LUMBAR INTERBODY FUSION LUMBAR TWO-THREE,LUMBAR THREE-FOUR WITH LATERAL PLATE.;  Surgeon: Temple Pacini, MD;  Location: [facility];  Service: Neurosurgery;  Laterality: Left;  left   . APPENDECTOMY    . BACK SURGERY    . BREAST ENHANCEMENT SURGERY  1989  . CARPAL TUNNEL RELEASE  2006   Right hand  . CATARACT EXTRACTION W/ INTRAOCULAR LENS  IMPLANT, BILATERAL     Hx: of  . CERVICAL FUSION  2005  . CHOLECYSTECTOMY  1989  . COLONOSCOPY W/ BIOPSIES AND POLYPECTOMY     Hx: of  . FINGER ARTHROSCOPY WITH CARPOMETACARPEL (CMC) ARTHROPLASTY     thumb  . FRACTURE SURGERY Right 09/2013   ankle and leg - plate and screws  . LUMBAR LAMINECTOMY/DECOMPRESSION MICRODISCECTOMY Right 11/06/2012   Procedure: LUMBAR TWO THREE LUMBAR LAMINECTOMY/DECOMPRESSION MICRODISCECTOMY 1 LEVEL;  Surgeon: Temple Pacini, MD;  Location: [facility];  Service: Neurosurgery;  Laterality: Right;  . PARTIAL HYSTERECTOMY  1980  . SHOULDER ARTHROSCOPY    . SPINAL CORD STIMULATOR IMPLANT  2010  . TARSAL SUSPENSION     Hx; of left thumb  .  TONSILLECTOMY  1955  . UPPER GI ENDOSCOPY     Hx: of    Allergies: Lithium; Codeine; Lisinopril; Scopolamine; Statins; and Adhesive [tape]  Medications: Prior to Admission medications   Medication Sig Start Date End Date Taking? Authorizing Provider  albuterol (PROVENTIL HFA;VENTOLIN HFA) 108 (90 BASE) MCG/ACT inhaler Inhale 2 puffs into the lungs every 6 (six) hours as needed for wheezing.   Yes [provider]  ALPRAZolam Prudy Feeler) 1 MG tablet Take 2 mg by mouth at bedtime. May take a dose of 1 mg during the day if needed for anxiety   Yes [provider]  ARIPiprazole (ABILIFY) 30 MG tablet Take 30 mg by mouth at bedtime. 05/04/16  Yes [provider]  Ascorbic Acid (VITAMIN C GUMMIE PO) Take 1 tablet by mouth daily after breakfast.    Yes  [provider]  butalbital-acetaminophen-caffeine (FIORICET, ESGIC) 50-325-40 MG per tablet Take 2 tablets by mouth every 4 (four) hours as needed for headache or migraine.    Yes [provider]  calcitonin, salmon, (MIACALCIN/FORTICAL) 200 UNIT/ACT nasal spray Place 1 spray into alternate nostrils daily after breakfast.    Yes [provider]  calcium carbonate (OSCAL) 1500 (600 Ca) MG TABS tablet Take 600 mg of elemental calcium by mouth daily with breakfast.   Yes [provider]  Cholecalciferol (VITAMIN D) 2000 units CAPS Take 2,000 Units by mouth daily after breakfast.   Yes [provider]  cyclobenzaprine (FLEXERIL) 10 MG tablet Take 20 mg by mouth at bedtime.    Yes [provider]  cycloSPORINE (RESTASIS) 0.05 % ophthalmic emulsion Place 1 drop into both eyes 2 (two) times daily.   Yes [provider]  docusate sodium (COLACE) 100 MG capsule Take 200 mg by mouth 2 (two) times daily.   Yes [provider]  esomeprazole (NEXIUM) 40 MG capsule Take 40 mg by mouth daily before breakfast.     Yes [provider]  ezetimibe (ZETIA) 10 MG tablet Take 10 mg by mouth at bedtime.   Yes [provider]  fluticasone (FLONASE) 50 MCG/ACT nasal spray Place 2 sprays into both nostrils 2 (two) times daily. 12/05/13  Yes Leslye Peer, MD  Fluticasone-Umeclidin-Vilant (TRELEGY ELLIPTA) 100-62.5-25 MCG/INH AEPB Inhale 1 puff into the lungs daily after breakfast.   Yes [provider]  furosemide (LASIX) 40 MG tablet Take 40 mg by mouth 4 (four) times daily.    Yes [provider]  HYDROmorphone (DILAUDID) 2 MG tablet Take 0.5-1 tablets (1-2 mg total) by mouth 2 (two) times daily. 1mg  in the am and 2mg  at bedtime. Patient taking differently: Take 2-4 mg by mouth 2 (two) times daily. 2 mg in the morning and 4 mg at bedtime. 06/02/16  Yes Pool, Sherilyn Cooter, MD  hydrOXYzine (ATARAX/VISTARIL) 25 MG tablet Take  25-50 mg by mouth 2 (two) times daily. 25 mg every morning, 50 mg every night   Yes [provider]  lamoTRIgine (LAMICTAL) 150 MG tablet Take 300 mg by mouth at bedtime.   Yes [provider]  lisdexamfetamine (VYVANSE) 70 MG capsule Take 70 mg by mouth daily after breakfast.    Yes [provider]  Loratadine 10 MG CAPS Take 10 mg by mouth at bedtime.    Yes [provider]  lubiprostone (AMITIZA) 8 MCG capsule Take 8 mcg by mouth 2 (two) times daily with a meal.   Yes [provider]  meclizine (ANTIVERT) 25 MG tablet Take 25 mg  by mouth 3 (three) times daily as needed for dizziness.    Yes [provider]  Melatonin 300 MCG TABS Take 300 mcg by mouth at bedtime.    Yes [provider]  mupirocin cream (BACTROBAN) 2 % Apply 1 application topically 3 (three) times daily.   Yes [provider]  nitroGLYCERIN (NITROSTAT) 0.4 MG SL tablet Place 0.4 mg under the tongue every 5 (five) minutes as needed for chest pain.   Yes [provider]  omeprazole (PRILOSEC) 20 MG capsule Take 20 mg by mouth at bedtime.     Yes [provider]  Polyethyl Glycol-Propyl Glycol (SYSTANE OP) Place 1 drop into both eyes at bedtime.   Yes [provider]  Potassium Gluconate 595 MG CAPS Take 1 capsule by mouth 2 (two) times daily.   Yes [provider]  pramipexole (MIRAPEX) 0.5 MG tablet Take 0.5 mg by mouth at bedtime.     Yes [provider]  pregabalin (LYRICA) 100 MG capsule Take 100 mg by mouth daily after breakfast.   Yes [provider]  pregabalin (LYRICA) 150 MG capsule Take 300 mg by mouth at bedtime.   Yes [provider]  spironolactone (ALDACTONE) 50 MG tablet Take 50 mg by mouth at bedtime.    Yes [provider]  sulfamethoxazole-trimethoprim (BACTRIM DS) 800-160 MG tablet Take 1 tablet by mouth 2 (two) times daily.   Yes [provider]  terbinafine  (LAMISIL) 250 MG tablet Take 250 mg by mouth daily after breakfast.   Yes [provider]  vitamin B-12 (CYANOCOBALAMIN) 1000 MCG tablet Take 1,000 mcg by mouth daily after breakfast.   Yes [provider]  amoxicillin-clavulanate (AUGMENTIN) 875-125 MG tablet Take 1 tablet by mouth 2 (two) times daily for 6 days. 05/22/17 05/28/17  Purohit, Salli Quarry, MD  clindamycin (CLEOCIN) 150 MG capsule Take 4 capsules (600 mg total) by mouth 3 (three) times daily for 6 days. 05/22/17 05/28/17  Purohit, Salli Quarry, MD     Family History  Problem Relation Age of Onset  . Lung cancer Mother   . Prostate cancer Father   . Heart disease Father   . Rheum arthritis Paternal Grandmother   . Asthma Sister     Social History   Socioeconomic History  . Marital status: Married    Spouse name: Not on file  . Number of children: Not on file  . Years of education: Not on file  . Highest education level: Not on file  Occupational History  . Not on file  Social Needs  . Financial resource strain: Not on file  . Food insecurity:    Worry: Not on file    Inability: Not on file  . Transportation needs:    Medical: Not on file    Non-medical: Not on file  Tobacco Use  . Smoking status: Former Smoker    Packs/day: 2.50    Years: 21.00    Pack years: 52.50    Types: Cigarettes    Last attempt to quit: 09/16/1986    Years since quitting: 30.7  . Smokeless tobacco: Never Used  Substance and Sexual Activity  . Alcohol use: No    Alcohol/week: 0.0 oz    Comment: recovering alcoholic sober since 1989  . Drug use: No  . Sexual activity: Not on file  Lifestyle  . Physical activity:    Days per week: Not on file    Minutes per session: Not on file  .  Stress: Not on file  Relationships  . Social connections:    Talks on phone: Not on file    Gets together: Not on file    Attends religious service: Not on file    Active member of club or organization: Not on file    Attends meetings of clubs  or organizations: Not on file    Relationship status: Not on file  Other Topics Concern  . Not on file  Social History Narrative  . Not on file    ECOG Status: 2 - Symptomatic, <50% confined to bed  Review of Systems: A 12 point ROS discussed and pertinent positives are indicated in the HPI above.  All other systems are negative.  Review of Systems  Vital Signs: BP 127/60 (BP Location: Left Arm)   Pulse (!) 58   Temp 97.8 F (36.6 C) (Oral)   Resp 17   Ht 5\' 4"  (1.626 m)   Wt 252 lb (114.3 kg)   SpO2 92%   BMI 43.26 kg/m   Physical Exam Constitutional: Oriented to person, place, and time. Well-developed and well-nourished, obese. No distress.  HENT:  Head: Normocephalic and atraumatic.  Eyes: Conjunctivae and EOM are normal. Right eye exhibits no discharge. Left eye exhibits no discharge. No scleral icterus.  Neck: No JVD present.  Pulmonary/Chest: Effort normal. No stridor. No respiratory distress.  Abdomen: soft, non distended Neurological:  alert and oriented to person, place, and time.  Skin: Skin is warm and dry.  not diaphoretic.  Psychiatric:   normal mood and affect.   behavior is normal. Judgment and thought content normal.  Lower extremities: Redness along the medial left calf and circumferential around the ankle, smaller than the marked line of her previous cellulitis.  No visible varicose veins.  2+ bilateral pitting edema in the lower calf and ankle.       Imaging: US Venous Img Lower Bilateral  Result Date: 05/24/2017 CLINICAL DATA:  Bilateral lower extremity edema. Remote history of DVT. Recent treatment for left leg cellulitis. EXAM: BILATERAL LOWER EXTREMITY VENOUS DUPLEX ULTRASOUND TECHNIQUE: Gray-scale sonography with graded compression, as well as color Doppler and duplex ultrasound, were performed to evaluate the deep and superficial veins of both lower extremities. Spectral Doppler was utilized to evaluate flow at rest and with distal augmentation  maneuvers. A complete superficial venous insufficiency exam was performed in the upright standing. I personally performed the technical portion of the superficial exam. COMPARISON:  04/11/2015 FINDINGS: RIGHT Deep Venous System: Evaluation of the deep venous system including the common femoral, femoral, profunda femoral, popliteal and calf veins (where visible) demonstrate no evidence of deep venous thrombosis. The vessels are compressible and demonstrate normal respiratory phasicity and response to augmentation. No evidence of significant deep venous reflux. RIGHT Superficial Venous System: SFJ: Patent, normal in caliber, no reflux post augmentation GSV Prox Thigh: Negative GSV Mid Thigh: Negative GSV Lower Thigh: Negative GSV Prox Calf: Negative GSV Mid Calf: Negative GSV Distal Calf: Negative SPJ: Not visualized SSV Prox: Patent, diminutive, no reflux SSV Mid: Negative SSV Distal: Negative Other: Mild subcutaneous edema in the calf. LEFT Deep Venous System: Evaluation of the deep venous system including the common femoral, femoral, profunda femoral, popliteal and calf veins (where visible) demonstrate no evidence of deep venous thrombosis. The vessels are compressible and demonstrate normal respiratory phasicity and response to augmentation. No evidence of deep venous reflux. LEFT Superficial Venous System: SFJ: Patent, normal in caliber, no reflux post augmentation GSV Prox Thigh: Negative GSV  Mid Thigh: Negative GSV Lower Thigh: Negative GSV Prox Calf: Negative GSV Mid Calf: Not visualized GSV Distal Calf: Not visualized SPJ: Not visualized SSV Prox: Patent, normal in caliber, no reflux SSV Mid: Negative SSV Distal: Negative Other: Moderate subcutaneous edema in the calf. IMPRESSION: 1. Negative for lower extremity DVT. 2. No superficial venous valvular incompetence or reflux in either lower extremity. Electronically Signed   By: Corlis Leak  Ciclaly Mulcahey M.D.   On: 05/24/2017 12:33   [facility]  Result Date:  05/24/2017 Please refer to "Notes" to see consult details.   Labs:  CBC: Recent Labs    05/24/16 1314  05/06/17 0931 05/20/17 1848 05/21/17 0419 05/22/17 0352  WBC 11.0*   < > 9.3 17.0* 14.0* 8.0  HGB 12.4  --   --  12.7 11.1* 11.2*  HCT 38.3   < > 40.0 38.2 36.1 36.0  PLT 289   < > 305 313 294 247   < > = values in this interval not displayed.    COAGS: No results for input(s): INR, APTT in the last 8760 hours.  BMP: Recent Labs    05/24/16 1314 05/20/17 1848  NA 142 136  K 3.2* 3.5  CL 98* 101  CO2 29 25  GLUCOSE 90 91  BUN 18 14  CALCIUM 9.5 8.8*  CREATININE 0.91 0.93  GFRNONAA >60 >60  GFRAA >60 >60    LIVER FUNCTION TESTS: No results for input(s): BILITOT, AST, ALT, ALKPHOS, PROT, ALBUMIN in the last 8760 hours.  TUMOR MARKERS: No results for input(s): AFPTM, CEA, CA199, CHROMGRNA in the last 8760 hours.  Assessment and Plan:  My impression is that this patient with a long history of lower extremity swelling and additional symptomatology has normal deep and superficial venous systems of both lower extremities.  There is likely no significant venous component of her lower extremity symptomatology.  No venous intervention is warranted.  I reviewed the findings with the patient.  We discussed the pathophysiology of superficial venous valvular incompetence and reflux.  She will follow-up primarily with you to further evaluate other potential etiologies of her lower extremity symptoms, and she   also scheduled to see Dr. Arbie CookeyEarly.  I am happy to see her back on an as-needed basis.  Thank you for this interesting consult.  I greatly enjoyed meeting Olene FlossLizann Veasey and look forward to participating in their care.  A copy of this report was sent to the requesting provider on this date.  Electronically Signed: Durwin Glazeayne Brighid Koch 05/24/2017, 1:08 PM   I spent a total of  40 Minutes   in face to face in clinical consultation, greater than 50% of which was  counseling/coordinating care for bilateral lower extremity swelling.

## 2017-05-26 ENCOUNTER — Other Ambulatory Visit: Payer: Self-pay | Admitting: Neurosurgery

## 2017-05-26 DIAGNOSIS — M47816 Spondylosis without myelopathy or radiculopathy, lumbar region: Secondary | ICD-10-CM

## 2017-06-02 ENCOUNTER — Ambulatory Visit
Admission: RE | Admit: 2017-06-02 | Discharge: 2017-06-02 | Disposition: A | Discharge status: Home / Self Care | Payer: Medicare Other | Source: Ambulatory Visit | Attending: Neurosurgery | Admitting: Neurosurgery

## 2017-06-02 DIAGNOSIS — M47816 Spondylosis without myelopathy or radiculopathy, lumbar region: Secondary | ICD-10-CM

## 2017-06-06 ENCOUNTER — Other Ambulatory Visit: Payer: Medicare Other

## 2017-06-06 ENCOUNTER — Telehealth: Payer: Self-pay

## 2017-06-06 ENCOUNTER — Encounter: Payer: Self-pay | Admitting: Internal Medicine

## 2017-06-06 ENCOUNTER — Inpatient Hospital Stay: Payer: Medicare Other | Attending: Internal Medicine | Admitting: Internal Medicine

## 2017-06-06 VITALS — BP 152/46 | HR 101 | Temp 97.5°F | Resp 18 | Ht 63.0 in | Wt 252.1 lb

## 2017-06-06 DIAGNOSIS — D509 Iron deficiency anemia, unspecified: Secondary | ICD-10-CM | POA: Diagnosis not present

## 2017-06-06 DIAGNOSIS — D500 Iron deficiency anemia secondary to blood loss (chronic): Secondary | ICD-10-CM

## 2017-06-06 DIAGNOSIS — I10 Essential (primary) hypertension: Secondary | ICD-10-CM | POA: Insufficient documentation

## 2017-06-06 DIAGNOSIS — J449 Chronic obstructive pulmonary disease, unspecified: Secondary | ICD-10-CM | POA: Insufficient documentation

## 2017-06-06 DIAGNOSIS — M797 Fibromyalgia: Secondary | ICD-10-CM | POA: Insufficient documentation

## 2017-06-06 DIAGNOSIS — Z86718 Personal history of other venous thrombosis and embolism: Secondary | ICD-10-CM | POA: Diagnosis not present

## 2017-06-06 DIAGNOSIS — Z79899 Other long term (current) drug therapy: Secondary | ICD-10-CM | POA: Insufficient documentation

## 2017-06-06 NOTE — Telephone Encounter (Signed)
Printed avs and calender of upcoming appointment. Per 4/29 los  

## 2017-06-06 NOTE — Progress Notes (Signed)
[facility] [phone]   [phone]) [phone]  OFFICE PROGRESS NOTE  Lenell Antu, DO [address] [place] [address]  DIAGNOSIS: Persistent microcytic anemia secondary to iron deficiency of unclear etiology probably secondary to poor intake.  PRIOR THERAPY: Feraheme infusion 510 mg IV weekly for 2 doses.  First dose April 15, 2017.  CURRENT THERAPY: Observation.  INTERVAL HISTORY: [name] 72 y.o. female returns to the clinic today for follow-up visit.  The patient is feeling fine today with no specific complaints except for generalized fatigue and weakness.  She was recently admitted to the hospital with COPD and swelling of the lower extremities as well as cellulitis.  She finished a course of antibiotics recently.  The patient denied having any recent weight loss or night sweats.  She has no chest pain, shortness of breath, cough or hemoptysis.  She denied having any fever or chills.  She received Feraheme infusion 2 months ago and she had significant improvement in her hemoglobin and hematocrit as well as iron study and ferritin.  She is here today for evaluation and repeat blood work.  MEDICAL HISTORY: Past Medical History:  Diagnosis Date  . Anxiety   . Asthma   . Bipolar affective disorder (HCC)   . Cough   . Depression   . DJD (degenerative joint disease) of lumbar spine   . DVT (deep venous thrombosis) (HCC) 2010   groin left - on coumadin for 6 months  . Family history of anesthesia complication    Hx: father has nausea  . Fibromyalgia   . GERD (gastroesophageal reflux disease)   . Hypertension    Hx: of in 2011  . Left leg DVT (HCC) 2010  . Migraine   . Pneumonia   . PONV (postoperative nausea and vomiting)    and migraines  . Shortness of breath    exertion  . Spinal cord stimulator dysfunction (HCC)    has one in,but not working  . Spinal headache   . Vertigo    Hx: of  . Vocal cord dysfunction    sees dr. Delton Coombes     ALLERGIES:  is allergic to lithium; codeine; lisinopril; scopolamine; statins; and adhesive [tape].  MEDICATIONS:  Current Outpatient Medications  Medication Sig Dispense Refill  . albuterol (PROVENTIL HFA;VENTOLIN HFA) 108 (90 BASE) MCG/ACT inhaler Inhale 2 puffs into the lungs every 6 (six) hours as needed for wheezing.    Marland Kitchen ALPRAZolam (XANAX) 1 MG tablet Take 2 mg by mouth at bedtime. May take a dose of 1 mg during the day if needed for anxiety    . ARIPiprazole (ABILIFY) 30 MG tablet Take 30 mg by mouth at bedtime.  5  . Ascorbic Acid (VITAMIN C GUMMIE PO) Take 1 tablet by mouth daily after breakfast.     . butalbital-acetaminophen-caffeine (FIORICET, ESGIC) 50-325-40 MG per tablet Take 2 tablets by mouth every 4 (four) hours as needed for headache or migraine.     . calcitonin, salmon, (MIACALCIN/FORTICAL) 200 UNIT/ACT nasal spray Place 1 spray into alternate nostrils daily after breakfast.     . calcium carbonate (OSCAL) 1500 (600 Ca) MG TABS tablet Take 600 mg of elemental calcium by mouth daily with breakfast.    . Cholecalciferol (VITAMIN D) 2000 units CAPS Take 2,000 Units by mouth daily after breakfast.    . cyclobenzaprine (FLEXERIL) 10 MG tablet Take 20 mg by mouth at bedtime.     . cycloSPORINE (  RESTASIS) 0.05 % ophthalmic emulsion Place 1 drop into both eyes 2 (two) times daily.    Marland Kitchen docusate sodium (COLACE) 100 MG capsule Take 200 mg by mouth 2 (two) times daily.    Marland Kitchen esomeprazole (NEXIUM) 40 MG capsule Take 40 mg by mouth daily before breakfast.      . ezetimibe (ZETIA) 10 MG tablet Take 10 mg by mouth at bedtime.    . fluticasone (FLONASE) 50 MCG/ACT nasal spray Place 2 sprays into both nostrils 2 (two) times daily. 16 g 11  . Fluticasone-Umeclidin-Vilant (TRELEGY ELLIPTA) 100-62.5-25 MCG/INH AEPB Inhale 1 puff into the lungs daily after breakfast.    . furosemide (LASIX) 40 MG tablet Take 40 mg by mouth 4 (four) times daily.     Marland Kitchen HYDROmorphone (DILAUDID) 2 MG tablet  Take 0.5-1 tablets (1-2 mg total) by mouth 2 (two) times daily.  in the am and  at bedtime. (Patient taking differently: Take 2-4 mg by mouth 2 (two) times daily. 2 mg in the morning and 4 mg at bedtime.) 60 tablet 0  . hydrOXYzine (ATARAX/VISTARIL) 25 MG tablet Take 25-50 mg by mouth 2 (two) times daily. 25 mg every morning, 50 mg every night    . lamoTRIgine (LAMICTAL) 150 MG tablet Take 300 mg by mouth at bedtime.    Marland Kitchen lisdexamfetamine (VYVANSE) 70 MG capsule Take 70 mg by mouth daily after breakfast.     . Loratadine 10 MG CAPS Take 10 mg by mouth at bedtime.     Marland Kitchen lubiprostone (AMITIZA) 8 MCG capsule Take 8 mcg by mouth 2 (two) times daily with a meal.    . meclizine (ANTIVERT) 25 MG tablet Take 25 mg by mouth 3 (three) times daily as needed for dizziness.     . Melatonin 300 MCG TABS Take 300 mcg by mouth at bedtime.     . mupirocin cream (BACTROBAN) 2 % Apply 1 application topically 3 (three) times daily.    . nitroGLYCERIN (NITROSTAT) 0.4 MG SL tablet Place 0.4 mg under the tongue every 5 (five) minutes as needed for chest pain.    Marland Kitchen omeprazole (PRILOSEC) 20 MG capsule Take 20 mg by mouth at bedtime.      Bertram Gala Glycol-Propyl Glycol (SYSTANE OP) Place 1 drop into both eyes at bedtime.    . Potassium Gluconate 595 MG CAPS Take 1 capsule by mouth 2 (two) times daily.    . pramipexole (MIRAPEX) 0.5 MG tablet Take 0.5 mg by mouth at bedtime.      . pregabalin (LYRICA) 100 MG capsule Take 100 mg by mouth daily after breakfast.    . pregabalin (LYRICA) 150 MG capsule Take 300 mg by mouth at bedtime.    Marland Kitchen spironolactone (ALDACTONE) 50 MG tablet Take 50 mg by mouth at bedtime.     . sulfamethoxazole-trimethoprim (BACTRIM DS) 800-160 MG tablet Take 1 tablet by mouth 2 (two) times daily.    Marland Kitchen terbinafine (LAMISIL) 250 MG tablet Take 250 mg by mouth daily after breakfast.    . vitamin B-12 (CYANOCOBALAMIN) 1000 MCG tablet Take 1,000 mcg by mouth daily after breakfast.     No current  facility-administered medications for this visit.     SURGICAL HISTORY:  Past Surgical History:  Procedure Laterality Date  . ANTERIOR LAT LUMBAR FUSION Left 02/15/2014   Procedure: ANTERIOR LATERAL LUMBAR INTERBODY FUSION LUMBAR TWO-THREE,LUMBAR THREE-FOUR WITH LATERAL PLATE.;  Surgeon: Temple Pacini, MD;  Location: [facility];  Service: Neurosurgery;  Laterality: Left;  left   . APPENDECTOMY    . BACK SURGERY    . BREAST ENHANCEMENT SURGERY  1989  . CARPAL TUNNEL RELEASE  2006   Right hand  . CATARACT EXTRACTION W/ INTRAOCULAR LENS  IMPLANT, BILATERAL     Hx: of  . CERVICAL FUSION  2005  . CHOLECYSTECTOMY  1989  . COLONOSCOPY W/ BIOPSIES AND POLYPECTOMY     Hx: of  . FINGER ARTHROSCOPY WITH CARPOMETACARPEL (CMC) ARTHROPLASTY     thumb  . FRACTURE SURGERY Right 09/2013   ankle and leg - plate and screws  . LUMBAR LAMINECTOMY/DECOMPRESSION MICRODISCECTOMY Right 11/06/2012   Procedure: LUMBAR TWO THREE LUMBAR LAMINECTOMY/DECOMPRESSION MICRODISCECTOMY 1 LEVEL;  Surgeon: Temple Pacini, MD;  Location: [facility];  Service: Neurosurgery;  Laterality: Right;  . PARTIAL HYSTERECTOMY  1980  . SHOULDER ARTHROSCOPY    . SPINAL CORD STIMULATOR IMPLANT  2010  . TARSAL SUSPENSION     Hx; of left thumb  . TONSILLECTOMY  1955  . UPPER GI ENDOSCOPY     Hx: of    REVIEW OF SYSTEMS:  A comprehensive review of systems was negative except for: Constitutional: positive for fatigue Respiratory: positive for dyspnea on exertion Musculoskeletal: positive for arthralgias and muscle weakness   PHYSICAL EXAMINATION: General appearance: alert, cooperative, fatigued and no distress Head: Normocephalic, without obvious abnormality, atraumatic Neck: no adenopathy, no JVD, supple, symmetrical, trachea midline and thyroid not enlarged, symmetric, no tenderness/mass/nodules Lymph nodes: Cervical, supraclavicular, and axillary nodes normal. Resp: clear to auscultation bilaterally Back: symmetric, no  curvature. ROM normal. No CVA tenderness. Cardio: regular rate and rhythm, S1, S2 normal, no murmur, click, rub or gallop GI: soft, non-tender; bowel sounds normal; no masses,  no organomegaly Extremities: extremities normal, atraumatic, no cyanosis or edema  ECOG PERFORMANCE STATUS: 1 - Symptomatic but completely ambulatory  Blood pressure (!) 152/46, pulse (!) 101, temperature (!) 97.5 F (36.4 C), temperature source Oral, resp. rate 18, height 5\' 3"  (1.6 m), weight 252 lb 1.6 oz (114.4 kg), SpO2 99 %.  LABORATORY DATA: Lab Results  Component Value Date   WBC 8.0 05/22/2017   HGB 11.2 (L) 05/22/2017   HCT 36.0 05/22/2017   MCV 82.6 05/22/2017   PLT 247 05/22/2017      Chemistry      Component Value Date/Time   NA 136 05/20/2017 1848   K 3.5 05/20/2017 1848   CL 101 05/20/2017 1848   CO2 25 05/20/2017 1848   BUN 14 05/20/2017 1848   CREATININE 0.93 05/20/2017 1848      Component Value Date/Time   CALCIUM 8.8 (L) 05/20/2017 1848   ALKPHOS 121 (H) 02/06/2014 1333   AST 21 02/06/2014 1333   ALT 17 02/06/2014 1333   BILITOT 0.3 02/06/2014 1333       RADIOGRAPHIC STUDIES: Ct Lumbar Spine Wo Contrast  Result Date: 06/02/2017 CLINICAL DATA:  Spondylosis without myelopathy or radiculopathy. Lumbar fusion. EXAM: CT LUMBAR SPINE WITHOUT CONTRAST TECHNIQUE: Multidetector CT imaging of the lumbar spine was performed without intravenous contrast administration. Multiplanar CT image reconstructions were also generated. COMPARISON:  X-ray lumbar spine 08/25/2016, CT lumbar 05/04/2016 FINDINGS: Segmentation: Normal.  Lowest disc space L5-S1 Alignment: Mild retrolisthesis L2-3 and L3-4 unchanged. Vertebrae: Multilevel lumbar fusion L1 through L5. Negative for fracture or mass. No evidence of hardware loosening. Paraspinal and other soft tissues: No paraspinous mass or fluid collection. Disc levels: T12-L1: Negative L1-2: Interval pedicle screw and interbody fusion since the prior CT.  Interbody spacer show  subsidence into the superior endplate of L2. Negative for stenosis. L2-3: Mild retrolisthesis. Left lateral plate and screw fixation. Interbody spacer in good position. Negative for spinal stenosis. Incomplete bony fusion. L3-4: Left lateral plate and screw fixation. Interbody spacer in good position. Incomplete bony fusion. L4-5: Pedicle screw and interbody fusion with solid fusion. No significant stenosis. L5-S1: Advanced disc degeneration with disc space narrowing and extensive vacuum phenomena. Diffuse endplate spurring and bilateral facet hypertrophy. Moderate subarticular and foraminal stenosis bilaterally. IMPRESSION: Interval pedicle screw and interbody fusion at L1-2. Subsidence of the spacers into the superior endplate of L2. No significant stenosis. Incomplete bony fusion at L2-3 and L3-4.  Solid fusion L4-5 Advanced disc degeneration L5-S1 with moderate subarticular and foraminal stenosis bilaterally. Electronically Signed   By: Marlan Palau M.D.   On: 06/02/2017 14:27   US Venous Img Lower Bilateral  Result Date: 05/24/2017 CLINICAL DATA:  Bilateral lower extremity edema. Remote history of DVT. Recent treatment for left leg cellulitis. EXAM: BILATERAL LOWER EXTREMITY VENOUS DUPLEX ULTRASOUND TECHNIQUE: Gray-scale sonography with graded compression, as well as color Doppler and duplex ultrasound, were performed to evaluate the deep and superficial veins of both lower extremities. Spectral Doppler was utilized to evaluate flow at rest and with distal augmentation maneuvers. A complete superficial venous insufficiency exam was performed in the upright standing. I personally performed the technical portion of the superficial exam. COMPARISON:  04/11/2015 FINDINGS: RIGHT Deep Venous System: Evaluation of the deep venous system including the common femoral, femoral, profunda femoral, popliteal and calf veins (where visible) demonstrate no evidence of deep venous thrombosis. The  vessels are compressible and demonstrate normal respiratory phasicity and response to augmentation. No evidence of significant deep venous reflux. RIGHT Superficial Venous System: SFJ: Patent, normal in caliber, no reflux post augmentation GSV Prox Thigh: Negative GSV Mid Thigh: Negative GSV Lower Thigh: Negative GSV Prox Calf: Negative GSV Mid Calf: Negative GSV Distal Calf: Negative SPJ: Not visualized SSV Prox: Patent, diminutive, no reflux SSV Mid: Negative SSV Distal: Negative Other: Mild subcutaneous edema in the calf. LEFT Deep Venous System: Evaluation of the deep venous system including the common femoral, femoral, profunda femoral, popliteal and calf veins (where visible) demonstrate no evidence of deep venous thrombosis. The vessels are compressible and demonstrate normal respiratory phasicity and response to augmentation. No evidence of deep venous reflux. LEFT Superficial Venous System: SFJ: Patent, normal in caliber, no reflux post augmentation GSV Prox Thigh: Negative GSV Mid Thigh: Negative GSV Lower Thigh: Negative GSV Prox Calf: Negative GSV Mid Calf: Not visualized GSV Distal Calf: Not visualized SPJ: Not visualized SSV Prox: Patent, normal in caliber, no reflux SSV Mid: Negative SSV Distal: Negative Other: Moderate subcutaneous edema in the calf. IMPRESSION: 1. Negative for lower extremity DVT. 2. No superficial venous valvular incompetence or reflux in either lower extremity. Electronically Signed   By: Corlis Leak M.D.   On: 05/24/2017 12:33   [place] Rad Eval And Mgmt  Result Date: 05/24/2017 Please refer to "Notes" to see consult details.   ASSESSMENT AND PLAN: This is a very pleasant 72 years old white female with history of iron deficiency anemia in addition to multiple other comorbidities.  She was treated with Feraheme infusion and had significant improvement in her hemoglobin and hematocrit as well as iron study and ferritin. I discussed the lab results with the patient today.  I  recommended for her to continue on observation for now.  I will see her back for follow-up visit in 3 months  for evaluation after repeating CBC, iron study and ferritin. She was advised to call immediately if she has any concerning symptoms in the interval. The patient voices understanding of current disease status and treatment options and is in agreement with the current care plan.  All questions were answered. The patient knows to call the clinic with any problems, questions or concerns. We can certainly see the patient much sooner if necessary.  I spent 10 minutes counseling the patient face to face. The total time spent in the appointment was 15 minutes.  Disclaimer: This note was dictated with voice recognition software. Similar sounding words can inadvertently be transcribed and may not be corrected upon review.

## 2017-06-14 ENCOUNTER — Ambulatory Visit: Payer: Medicare Other | Admitting: Vascular Surgery

## 2017-06-14 ENCOUNTER — Encounter: Payer: Self-pay | Admitting: Vascular Surgery

## 2017-06-14 VITALS — BP 158/82 | HR 81 | Resp 18 | Ht 63.0 in | Wt 257.0 lb

## 2017-06-14 DIAGNOSIS — I890 Lymphedema, not elsewhere classified: Secondary | ICD-10-CM | POA: Diagnosis not present

## 2017-06-14 NOTE — Progress Notes (Signed)
Vascular and Vein Specialist of [facility]  Patient name: [name] MRN: [id number] DOB: 1945/08/25 Sex: female  REASON FOR CONSULT: Evaluation of lower extremity swelling  HPI: [name] is a 72 y.o. female, who is seen today for evaluation of lower externally swellings.  She reports this is been present for years and is somewhat progressive.  It is making it difficult for her to get around now due to the swelling and tightness in her calf and ankle.  She does have a prior history of DVT.  Also has diagnosis of fibromyalgia and degenerative spine disc disease which could also be accounting for her difficulty in mobilization.  She does not have any history of tissue loss.  No history of arterial insufficiency.  Past Medical History:  Diagnosis Date  . Anxiety   . Asthma   . Bipolar affective disorder (HCC)   . Cough   . Depression   . DJD (degenerative joint disease) of lumbar spine   . DVT (deep venous thrombosis) (HCC) 2010   groin left - on coumadin for 6 months  . Family history of anesthesia complication    Hx: father has nausea  . Fibromyalgia   . GERD (gastroesophageal reflux disease)   . Hypertension    Hx: of in 2011  . Left leg DVT (HCC) 2010  . Migraine   . Pneumonia   . PONV (postoperative nausea and vomiting)    and migraines  . Shortness of breath    exertion  . Spinal cord stimulator dysfunction (HCC)    has one in,but not working  . Spinal headache   . Vertigo    Hx: of  . Vocal cord dysfunction    sees dr. Delton Coombes    Family History  Problem Relation Age of Onset  . Lung cancer Mother   . Prostate cancer Father   . Heart disease Father   . Rheum arthritis Paternal Grandmother   . Asthma Sister     SOCIAL HISTORY: Social History   Socioeconomic History  . Marital status: Married    Spouse name: Not on file  . Number of children: Not on file  . Years of education: Not on file  . Highest education level:  Not on file  Occupational History  . Not on file  Social Needs  . Financial resource strain: Not on file  . Food insecurity:    Worry: Not on file    Inability: Not on file  . Transportation needs:    Medical: Not on file    Non-medical: Not on file  Tobacco Use  . Smoking status: Former Smoker    Packs/day: 2.50    Years: 21.00    Pack years: 52.50    Types: Cigarettes    Last attempt to quit: 09/16/1986    Years since quitting: 30.7  . Smokeless tobacco: Never Used  Substance and Sexual Activity  . Alcohol use: No    Alcohol/week: 0.0 oz    Comment: recovering alcoholic sober since 1989  . Drug use: No  . Sexual activity: Not on file  Lifestyle  . Physical activity:    Days per week: Not on file    Minutes per session: Not on file  . Stress: Not on file  Relationships  . Social connections:    Talks on phone: Not on file    Gets together: Not on file    Attends religious service: Not on file    Active member of club or  organization: Not on file    Attends meetings of clubs or organizations: Not on file    Relationship status: Not on file  . Intimate partner violence:    Fear of current or ex partner: Not on file    Emotionally abused: Not on file    Physically abused: Not on file    Forced sexual activity: Not on file  Other Topics Concern  . Not on file  Social History Narrative  . Not on file    Allergies  Allergen Reactions  . Lithium Other (See Comments)    Migraines and "drunk"  . Codeine Nausea And Vomiting  . Lisinopril Other (See Comments)    kidney failure  . Scopolamine Other (See Comments)    Causes vertigo  . Statins Other (See Comments)    Very weak and achy  . Adhesive [Tape] Rash    All tape, paper and steri-strips included    Current Outpatient Medications  Medication Sig Dispense Refill  . albuterol (PROVENTIL HFA;VENTOLIN HFA) 108 (90 BASE) MCG/ACT inhaler Inhale 2 puffs into the lungs every 6 (six) hours as needed for wheezing.      Marland Kitchen ALPRAZolam (XANAX) 1 MG tablet Take 2 mg by mouth at bedtime. May take a dose of 1 mg during the day if needed for anxiety    . AMITIZA 24 MCG capsule     . ARIPiprazole (ABILIFY) 30 MG tablet Take 30 mg by mouth at bedtime.  5  . Ascorbic Acid (VITAMIN C GUMMIE PO) Take 1 tablet by mouth daily after breakfast.     . butalbital-acetaminophen-caffeine (FIORICET, ESGIC) 50-325-40 MG per tablet Take 2 tablets by mouth every 4 (four) hours as needed for headache or migraine.     . calcitonin, salmon, (MIACALCIN/FORTICAL) 200 UNIT/ACT nasal spray Place 1 spray into alternate nostrils daily after breakfast.     . calcium carbonate (OSCAL) 1500 (600 Ca) MG TABS tablet Take 600 mg of elemental calcium by mouth daily with breakfast.    . Cholecalciferol (VITAMIN D) 2000 units CAPS Take 2,000 Units by mouth daily after breakfast.    . cyclobenzaprine (FLEXERIL) 10 MG tablet Take 20 mg by mouth at bedtime.     . cycloSPORINE (RESTASIS) 0.05 % ophthalmic emulsion Place 1 drop into both eyes 2 (two) times daily.    Marland Kitchen docusate sodium (COLACE) 100 MG capsule Take 200 mg by mouth 2 (two) times daily.    Marland Kitchen esomeprazole (NEXIUM) 40 MG capsule Take 40 mg by mouth daily before breakfast.      . ezetimibe (ZETIA) 10 MG tablet Take 10 mg by mouth at bedtime.    . fluticasone (FLONASE) 50 MCG/ACT nasal spray Place 2 sprays into both nostrils 2 (two) times daily. 16 g 11  . Fluticasone-Umeclidin-Vilant (TRELEGY ELLIPTA) 100-62.5-25 MCG/INH AEPB Inhale 1 puff into the lungs daily after breakfast.    . furosemide (LASIX) 40 MG tablet Take 40 mg by mouth 4 (four) times daily.     Marland Kitchen HYDROmorphone (DILAUDID) 2 MG tablet Take 0.5-1 tablets (1-2 mg total) by mouth 2 (two) times daily.  in the am and  at bedtime. (Patient taking differently: Take 2-4 mg by mouth 2 (two) times daily. 2 mg in the morning and 4 mg at bedtime.) 60 tablet 0  . hydrOXYzine (ATARAX/VISTARIL) 25 MG tablet Take 25-50 mg by mouth 2 (two) times  daily. 25 mg every morning, 50 mg every night    . lamoTRIgine (LAMICTAL) 150 MG tablet Take 300  mg by mouth at bedtime.    Marland Kitchen lisdexamfetamine (VYVANSE) 70 MG capsule Take 70 mg by mouth daily after breakfast.     . Loratadine 10 MG CAPS Take 10 mg by mouth at bedtime.     . meclizine (ANTIVERT) 25 MG tablet Take 25 mg by mouth 3 (three) times daily as needed for dizziness.     . Melatonin 300 MCG TABS Take 300 mcg by mouth at bedtime.     . mupirocin cream (BACTROBAN) 2 % Apply 1 application topically 3 (three) times daily.    . nitroGLYCERIN (NITROSTAT) 0.4 MG SL tablet Place 0.4 mg under the tongue every 5 (five) minutes as needed for chest pain.    Marland Kitchen omeprazole (PRILOSEC) 20 MG capsule Take 20 mg by mouth at bedtime.      Bertram Gala Glycol-Propyl Glycol (SYSTANE OP) Place 1 drop into both eyes at bedtime.    . Potassium Gluconate 595 MG CAPS Take 1 capsule by mouth 2 (two) times daily.    . pramipexole (MIRAPEX) 0.5 MG tablet Take 0.5 mg by mouth at bedtime.      . pregabalin (LYRICA) 100 MG capsule Take 100 mg by mouth daily after breakfast.    . pregabalin (LYRICA) 150 MG capsule Take 300 mg by mouth at bedtime.    Marland Kitchen spironolactone (ALDACTONE) 50 MG tablet Take 50 mg by mouth at bedtime.     . sulfamethoxazole-trimethoprim (BACTRIM DS) 800-160 MG tablet Take 1 tablet by mouth 2 (two) times daily.    Marland Kitchen terbinafine (LAMISIL) 250 MG tablet Take 250 mg by mouth daily after breakfast.    . vitamin B-12 (CYANOCOBALAMIN) 1000 MCG tablet Take 1,000 mcg by mouth daily after breakfast.     No current facility-administered medications for this visit.     REVIEW OF SYSTEMS:   denotes positive finding,  denotes negative finding Cardiac  Comments:  Chest pain or chest pressure:    Shortness of breath upon exertion: x   Short of breath when lying flat:    Irregular heart rhythm:        Vascular    Pain in calf, thigh, or hip brought on by ambulation: x   Pain in feet at night that wakes  you up from your sleep:  x   Blood clot in your veins:    Leg swelling:  x       Pulmonary    Oxygen at home:    Productive cough:  x   Wheezing:  x       Neurologic    Sudden weakness in arms or legs:  x   Sudden numbness in arms or legs:  x   Sudden onset of difficulty speaking or slurred speech:    Temporary loss of vision in one eye:  x   Problems with dizziness:  x       Gastrointestinal    Blood in stool:     Vomited blood:         Genitourinary    Burning when urinating:     Blood in urine:        Psychiatric    Major depression:  x       Hematologic    Bleeding problems:    Problems with blood clotting too easily: x       Skin    Rashes or ulcers:        Constitutional    Fever or chills:  PHYSICAL EXAM: Vitals:   06/14/17 1415 06/14/17 1416  BP: (!) 173/91 (!) 158/82  Pulse: 81   Resp: 18   SpO2: 97%   Weight: 257 lb (116.6 kg)   Height:  (1.6 m)     GENERAL: The patient is a well-nourished female, in no acute distress. The vital signs are documented above. CARDIOVASCULAR: I do not palpate pedal pulses due to marked swelling in her feet.  She does have edema from her knees down into her feet including down into her toes bilaterally. PULMONARY: There is good air exchange  ABDOMEN: Soft and non-tender  MUSCULOSKELETAL: There are no major deformities or cyanosis. NEUROLOGIC: No focal weakness or paresthesias are detected. SKIN: Skin changes with peau d'orange appearance consistent with lymphedema PSYCHIATRIC: The patient has a normal affect.  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  She has multiple arthritic difficulty in her hands and therefore cannot place compression garments.  Also due to spine issues and COPD cannot lay flat with her legs elevated.  I feel that she would be an excellent candidate for lymphedema sequential pump admit request for  coverage of this through insurance.  I did explain to her that this is frequently denied and she is willing to consider paying out-of-pocket for this.  We will see her again on an as-needed basis   Larina Earthly, MD [facility] and Ve[facility] Specialists of [facility] Tel [phone] Pager [phone]

## 2017-06-16 ENCOUNTER — Telehealth: Payer: Self-pay | Admitting: *Deleted

## 2017-06-16 NOTE — Telephone Encounter (Signed)
Called [employer] to notify of faxed form and office notes

## 2017-08-25 ENCOUNTER — Other Ambulatory Visit: Payer: Self-pay | Admitting: Anesthesiology

## 2017-08-29 ENCOUNTER — Telehealth: Payer: Self-pay

## 2017-08-29 NOTE — Telephone Encounter (Signed)
Returned patient call. Had questions about lymphadema in leg and had itchy, burning, scaling skin on leg above knee. Patient has lymphadema pump. Informed patient that this is a symptom of the lymphadema itself. Use lotion and occasionally some over the counter cortisone cream. If persists call us back and we could see about referring her to a Lymphadema clinic.

## 2017-08-31 ENCOUNTER — Encounter: Payer: Self-pay | Admitting: Emergency Medicine

## 2017-08-31 ENCOUNTER — Ambulatory Visit: Payer: Medicare Other | Admitting: Emergency Medicine

## 2017-08-31 DIAGNOSIS — K219 Gastro-esophageal reflux disease without esophagitis: Secondary | ICD-10-CM | POA: Diagnosis not present

## 2017-08-31 DIAGNOSIS — R0602 Shortness of breath: Secondary | ICD-10-CM | POA: Diagnosis not present

## 2017-08-31 DIAGNOSIS — J301 Allergic rhinitis due to pollen: Secondary | ICD-10-CM | POA: Diagnosis not present

## 2017-08-31 DIAGNOSIS — J449 Chronic obstructive pulmonary disease, unspecified: Secondary | ICD-10-CM | POA: Diagnosis not present

## 2017-08-31 MED ORDER — TIOTROPIUM BROMIDE-OLODATEROL 2.5-2.5 MCG/ACT IN AERS: 2.0000 | INHALATION_SPRAY | Freq: Every day | RESPIRATORY_TRACT | 5 refills | Status: DC

## 2017-08-31 NOTE — Assessment & Plan Note (Signed)
She is on both Nexium and omeprazole.  Good control currently

## 2017-08-31 NOTE — Progress Notes (Signed)
72 yo former smoker, hx documented allergies and rhinitis, chronic cough.   ROV 03/11/17 --very pleasant 72 year old woman with a history of upper airway irritation syndrome and chronic cough in the setting of severe rhinitis and esophageal reflux.  She also had suspected obstructive lung disease.  We have followed her also in the past for stable pulmonary nodules and a chronically enlarged right hilar lymph node.  She returns today for follow-up.   She tells me that she experienced acute on chronic LE edema beginning about 2 weeks ago, she is seeing cardiology Dr Zorita Pang, having her diuretics adjusted. She believes that it has worsened. She is also having severe knee pain. She describes progression of severe exertional dyspnea, even with just walking. She has heard some wheeze with exertion.her baseline cough has been stable.  She is using proair, increased to bid recently without much effect. She had a CXR last month - she was told no pulm edema.   ROV 04/13/17 --follow-up visit today for history of chronic cough and upper airway irritation syndrome, exacerbated by severe rhinitis and esophageal reflux.  At her last visit she noted progressive exertional dyspnea even with just walking.  She has had some increased lower extremity edema as well.  She did not desaturate with ambulation on room air but had to stop due to dyspnea.  Chest x-ray from 03/11/17 did not show any acute infiltrates or pulmonary edema, there may have been some mild hyper inflation.   We performed pulmonary function testing today which I have reviewed.  This shows grossly normal airflows without a bronchodilator response.  There may be some mild curve to the flow volume loop that could suggest some mild obstruction.  Her lung volumes are normal.  Her diffusion capacity is slightly decreased and corrects to normal when adjusted for alveolar volume. She did a trial of Symbicort recently from Dr Conley Rolls, no real improvement in her breathing. She has  been found to have Fe-deficiency anemia, is going to start Fe infusions. She is also on lasix now for her LE edema. She doesn't snore much, no witnessed apneas.   ROV 08/31/17 --72 year old woman with a history of chronic upper airway irritation syndrome and chronic cough, severe rhinitis and esophageal reflux.  I also followed her for chronic progressive exertional dyspnea.  She has grossly normal airflows but with some possible mild curve to her flow volume loop.  She is been tried on bronchodilators in the past without much improvement.  She been managed on a good allergy regimen and acid suppression.  We performed a ventilation/perfusion scan 04/19/2017 that was low probability for PE.  She reports that she has been experiencing continued exertional SOB. She did do Fe infusions - seemed to help her some. She cannot walk across the room without SOB. Her cough is better, fewer throat symptoms. She rarely uses albuterol, but sometimes with a longer walk. She was tried on trelegy by Dr Conley Rolls > no benefit, had to stop because she could not tolerate powder. She is interested in starting an alternative BD if possible.   She is on flonase, loratadine, nexium and omeprazole     Vitals:   08/31/17 1606  BP: 130/82  Pulse: 86  SpO2: 93%  Weight: 247 lb 9.6 oz (112.3 kg)  Height: 5\' 3"  (1.6 m)   Gen: Pleasant, obese woman, in no distress,  normal affect  ENT: No lesions,  mouth clear,  oropharynx clear, no postnasal drip  Neck: No JVD, no stridor, strong  voice  Lungs: No use of accessory muscles, no crackles or wheezes  Cardiovascular: RRR, heart sounds normal, no murmur or gallops, no peripheral edema  Musculoskeletal: No deformities, no cyanosis or clubbing  Neuro: alert, non focal  Skin: Warm, no lesions or rashes    CPST 07/18/12 --  Conclusion: Exercise testing with gas exchange demonstrates a normal functional capacity when compared to matched sedentary norms. There is a relative  hyperventilation near peak exercise (relatively steep RCP), when VE/VCO2 is corrected to the RCP the slope remains significantly elevated at 37. The elevated VE/VCO2 slope and low HR response are concerning for an underlying circulatory limitation as primary problem. The most common type in obese individuals is diastolic dysfunction. In addition, Air flow limitation and obesity could be contributing.   COPD (chronic obstructive pulmonary disease) (HCC) Very subtle obstruction noted on her previous pulmonary function testing.  She is never responded well to bronchodilators.  She did not benefit from Symbicort, definitely did not like the powdered formulation of Trelegy.  She is asking me for a trial of an alternative to see if she gets any relief.  I think the best choice that is not powder formulation will be Stiolto.  We will try this for a month and see if she benefits.  Again given her mild obstruction and failure to benefit from bronchodilators in the past I am not convinced that this is going to be helpful.  Allergic rhinitis Good control on her current Flonase and loratadine.  Continue this regimen  G E R D She is on both Nexium and omeprazole.  Good control currently  Dyspnea Likely due to her obesity and deconditioning.  She has upper airway symptoms which are often confused with obstructive lung disease.  Her spirometry shows only a very subtle curve to her flow volume loop.  I do not believe she has obstructive lung disease driving her dyspnea.  All the same she wants to try to see if a schedule bronchodilator will give her any benefit.  I am willing to do a trial and see if she gets a clinical response.  Levy Pupaobert Brittinee Risk, MD, PhD 08/31/2017, 4:23 PM [facility] [phone] or if no answer [phone]

## 2017-08-31 NOTE — Addendum Note (Signed)
Addended by: Earvin HansenONNOLLY, Marshel Golubski M on: 08/31/2017 04:41 PM   Modules accepted: Orders

## 2017-08-31 NOTE — Patient Instructions (Signed)
We will do a trial of Stiolto 2 puffs once a day.  If you feel like you benefit from this medication please call our office and we will send in order to your pharmacy. Keep your albuterol available to use 2 puffs if needed for shortness of breath, chest tightness, wheezing Please continue Flonase and loratadine as you have been taking them. Continue your Nexium and omeprazole as you have been taking them. Follow with Dr Delton CoombesByrum in 3 months or sooner if you have any problems.

## 2017-08-31 NOTE — Assessment & Plan Note (Signed)
Very subtle obstruction noted on her previous pulmonary function testing.  She is never responded well to bronchodilators.  She did not benefit from Symbicort, definitely did not like the powdered formulation of Trelegy.  She is asking me for a trial of an alternative to see if she gets any relief.  I think the best choice that is not powder formulation will be Stiolto.  We will try this for a month and see if she benefits.  Again given her mild obstruction and failure to benefit from bronchodilators in the past I am not convinced that this is going to be helpful.

## 2017-08-31 NOTE — Assessment & Plan Note (Signed)
Likely due to her obesity and deconditioning.  She has upper airway symptoms which are often confused with obstructive lung disease.  Her spirometry shows only a very subtle curve to her flow volume loop.  I do not believe she has obstructive lung disease driving her dyspnea.  All the same she wants to try to see if a schedule bronchodilator will give her any benefit.  I am willing to do a trial and see if she gets a clinical response.

## 2017-08-31 NOTE — Assessment & Plan Note (Signed)
Good control on her current Flonase and loratadine.  Continue this regimen

## 2017-09-05 ENCOUNTER — Telehealth: Payer: Self-pay

## 2017-09-05 ENCOUNTER — Inpatient Hospital Stay: Payer: Medicare Other | Attending: Internal Medicine | Admitting: Internal Medicine

## 2017-09-05 ENCOUNTER — Encounter: Payer: Self-pay | Admitting: Internal Medicine

## 2017-09-05 ENCOUNTER — Inpatient Hospital Stay: Payer: Medicare Other

## 2017-09-05 VITALS — BP 153/84 | HR 88 | Temp 98.3°F | Resp 18 | Ht 63.0 in | Wt 250.3 lb

## 2017-09-05 DIAGNOSIS — D508 Other iron deficiency anemias: Secondary | ICD-10-CM

## 2017-09-05 DIAGNOSIS — D500 Iron deficiency anemia secondary to blood loss (chronic): Secondary | ICD-10-CM

## 2017-09-05 LAB — CBC WITH DIFFERENTIAL (CANCER CENTER ONLY)
BASOS PCT: 1 %
Basophils Absolute: 0 10*3/uL (ref 0.0–0.1)
EOS ABS: 0.3 10*3/uL (ref 0.0–0.5)
Eosinophils Relative: 4 %
HEMATOCRIT: 39.6 % (ref 34.8–46.6)
Hemoglobin: 13.2 g/dL (ref 11.6–15.9)
Lymphocytes Relative: 22 %
Lymphs Abs: 1.8 10*3/uL (ref 0.9–3.3)
MCH: 28.6 pg (ref 25.1–34.0)
MCHC: 33.4 g/dL (ref 31.5–36.0)
MCV: 85.7 fL (ref 79.5–101.0)
MONO ABS: 0.8 10*3/uL (ref 0.1–0.9)
MONOS PCT: 9 %
NEUTROS ABS: 5.3 10*3/uL (ref 1.5–6.5)
Neutrophils Relative %: 64 %
Platelet Count: 301 10*3/uL (ref 145–400)
RBC: 4.62 MIL/uL (ref 3.70–5.45)
RDW: 14.9 % — AB (ref 11.2–14.5)
WBC Count: 8.2 10*3/uL (ref 3.9–10.3)

## 2017-09-05 LAB — IRON AND TIBC
Iron: 97 ug/dL (ref 41–142)
Saturation Ratios: 33 % (ref 21–57)
TIBC: 294 ug/dL (ref 236–444)
UIBC: 197 ug/dL

## 2017-09-05 LAB — FERRITIN: Ferritin: 78 ng/mL (ref 11–307)

## 2017-09-05 NOTE — Progress Notes (Signed)
[facility] [phone]   [phone]  OFFICE PROGRESS NOTE  Lenell Antu, DO [address] [place] [address]  DIAGNOSIS: Persistent microcytic anemia secondary to iron deficiency of unclear etiology probably secondary to poor intake.  PRIOR THERAPY: Feraheme infusion 510 mg IV weekly for 2 doses.  First dose April 15, 2017.  CURRENT THERAPY: Observation.  INTERVAL HISTORY: [name] 72 y.o. female returns to the clinic today for 23-month follow-up visit.  The patient is feeling fine today with no concerning complaints except for fatigue and back pain.  She is scheduled to have replacement of the spinal stimulator in the next few weeks.  She denied having any chest pain but has shortness of breath with exertion with no cough or hemoptysis.  She denied having any nausea, vomiting, diarrhea or constipation.  The patient is intolerable to the oral iron tablet. She came today for evaluation with repeat CBC, iron study and ferritin.  MEDICAL HISTORY: Past Medical History:  Diagnosis Date  . Anxiety   . Asthma   . Bipolar affective disorder (HCC)   . Cough   . Depression   . DJD (degenerative joint disease) of lumbar spine   . DVT (deep venous thrombosis) (HCC) 2010   groin left - on coumadin for 6 months  . Family history of anesthesia complication    Hx: father has nausea  . Fibromyalgia   . GERD (gastroesophageal reflux disease)   . Hypertension    Hx: of in 2011  . Left leg DVT (HCC) 2010  . Migraine   . Pneumonia   . PONV (postoperative nausea and vomiting)    and migraines  . Shortness of breath    exertion  . Spinal cord stimulator dysfunction (HCC)    has one in,but not working  . Spinal headache   . Vertigo    Hx: of  . Vocal cord dysfunction    sees dr. Delton Coombes    ALLERGIES:  is allergic to lithium; codeine; lisinopril; scopolamine; statins; and adhesive [tape].  MEDICATIONS:  Current Outpatient Medications    Medication Sig Dispense Refill  . albuterol (PROVENTIL HFA;VENTOLIN HFA) 108 (90 BASE) MCG/ACT inhaler Inhale 2 puffs into the lungs every 6 (six) hours as needed for wheezing.    Marland Kitchen ALPRAZolam (XANAX) 1 MG tablet Take 2 mg by mouth at bedtime. May take a dose of 1 mg during the day if needed for anxiety    . AMITIZA 24 MCG capsule     . ARIPiprazole (ABILIFY) 30 MG tablet Take 30 mg by mouth at bedtime.  5  . Ascorbic Acid (VITAMIN C GUMMIE PO) Take 1 tablet by mouth daily after breakfast.     . butalbital-acetaminophen-caffeine (FIORICET, ESGIC) 50-325-40 MG per tablet Take 2 tablets by mouth every 4 (four) hours as needed for headache or migraine.     . calcitonin, salmon, (MIACALCIN/FORTICAL) 200 UNIT/ACT nasal spray Place 1 spray into alternate nostrils daily after breakfast.     . calcium carbonate (OSCAL) 1500 (600 Ca) MG TABS tablet Take 600 mg of elemental calcium by mouth daily with breakfast.    . Cholecalciferol (VITAMIN D) 2000 units CAPS Take 2,000 Units by mouth daily after breakfast.    . cyclobenzaprine (FLEXERIL) 10 MG tablet Take 20 mg by mouth at bedtime.     . cycloSPORINE (RESTASIS) 0.05 % ophthalmic emulsion Place 1 drop into both eyes 2 (two) times daily.    Marland Kitchen  docusate sodium (COLACE) 100 MG capsule Take 200 mg by mouth 2 (two) times daily.    Marland Kitchen esomeprazole (NEXIUM) 40 MG capsule Take 40 mg by mouth daily before breakfast.      . ezetimibe (ZETIA) 10 MG tablet Take 10 mg by mouth at bedtime.    . fluticasone (FLONASE) 50 MCG/ACT nasal spray Place 2 sprays into both nostrils 2 (two) times daily. 16 g 11  . furosemide (LASIX) 40 MG tablet Take 40 mg by mouth 4 (four) times daily.     Marland Kitchen HYDROmorphone (DILAUDID) 2 MG tablet Take 0.5-1 tablets (1-2 mg total) by mouth 2 (two) times daily. 1mg  in the am and 2mg  at bedtime. (Patient taking differently: Take 2-4 mg by mouth See admin instructions. 2 mg in the morning and 4 mg at bedtime.) 60 tablet 0  . hydrOXYzine  (ATARAX/VISTARIL) 25 MG tablet Take 25-50 mg by mouth 2 (two) times daily. 25 mg every morning, 50 mg every night    . lamoTRIgine (LAMICTAL) 150 MG tablet Take 300 mg by mouth at bedtime.    Marland Kitchen lisdexamfetamine (VYVANSE) 70 MG capsule Take 70 mg by mouth daily after breakfast.     . Loratadine 10 MG CAPS Take 10 mg by mouth at bedtime.     . meclizine (ANTIVERT) 25 MG tablet Take 25 mg by mouth 3 (three) times daily as needed for dizziness.     . Melatonin 300 MCG TABS Take 300 mcg by mouth at bedtime.     . nitroGLYCERIN (NITROSTAT) 0.4 MG SL tablet Place 0.4 mg under the tongue every 5 (five) minutes as needed for chest pain.    Marland Kitchen omeprazole (PRILOSEC) 20 MG capsule Take 20 mg by mouth at bedtime.      Bertram Gala Glycol-Propyl Glycol (SYSTANE OP) Place 1 drop into both eyes at bedtime.    . Potassium Gluconate 595 MG CAPS Take 1 capsule by mouth 2 (two) times daily.    . pramipexole (MIRAPEX) 0.5 MG tablet Take 0.5 mg by mouth at bedtime.      . pregabalin (LYRICA) 100 MG capsule Take 100 mg by mouth daily after breakfast.    . pregabalin (LYRICA) 150 MG capsule Take 300 mg by mouth at bedtime.    Marland Kitchen spironolactone (ALDACTONE) 50 MG tablet Take 50 mg by mouth at bedtime.     . terbinafine (LAMISIL) 250 MG tablet Take 250 mg by mouth daily after breakfast.    . Tiotropium Bromide-Olodaterol (STIOLTO RESPIMAT) 2.5-2.5 MCG/ACT AERS Inhale 2 puffs into the lungs daily. 1 Inhaler 5  . Tiotropium Bromide-Olodaterol (STIOLTO RESPIMAT) 2.5-2.5 MCG/ACT AERS Inhale 2 puffs into the lungs daily. 1 Inhaler 5  . triamcinolone (KENALOG) 0.025 % cream Apply 1 application topically 2 (two) times daily as needed (itching).    . vitamin B-12 (CYANOCOBALAMIN) 1000 MCG tablet Take 1,000 mcg by mouth daily after breakfast.     No current facility-administered medications for this visit.     SURGICAL HISTORY:  Past Surgical History:  Procedure Laterality Date  . ANTERIOR LAT LUMBAR FUSION Left 02/15/2014    Procedure: ANTERIOR LATERAL LUMBAR INTERBODY FUSION LUMBAR TWO-THREE,LUMBAR THREE-FOUR WITH LATERAL PLATE.;  Surgeon: Temple Pacini, MD;  Location: [facility];  Service: Neurosurgery;  Laterality: Left;  left   . APPENDECTOMY    . BACK SURGERY    . BREAST ENHANCEMENT SURGERY  1989  . CARPAL TUNNEL RELEASE  2006   Right hand  . CATARACT EXTRACTION W/ INTRAOCULAR LENS  IMPLANT, BILATERAL     Hx: of  . CERVICAL FUSION  2005  . CHOLECYSTECTOMY  1989  . COLONOSCOPY W/ BIOPSIES AND POLYPECTOMY     Hx: of  . FINGER ARTHROSCOPY WITH CARPOMETACARPEL (CMC) ARTHROPLASTY     thumb  . FRACTURE SURGERY Right 09/2013   ankle and leg - plate and screws  . LUMBAR LAMINECTOMY/DECOMPRESSION MICRODISCECTOMY Right 11/06/2012   Procedure: LUMBAR TWO THREE LUMBAR LAMINECTOMY/DECOMPRESSION MICRODISCECTOMY 1 LEVEL;  Surgeon: Temple PaciniHenry A Pool, MD;  Location: [facility];  Service: Neurosurgery;  Laterality: Right;  . PARTIAL HYSTERECTOMY  1980  . SHOULDER ARTHROSCOPY    . SPINAL CORD STIMULATOR IMPLANT  2010  . TARSAL SUSPENSION     Hx; of left thumb  . TONSILLECTOMY  1955  . UPPER GI ENDOSCOPY     Hx: of    REVIEW OF SYSTEMS:  A comprehensive review of systems was negative except for: Constitutional: positive for fatigue Respiratory: positive for dyspnea on exertion Musculoskeletal: positive for arthralgias   PHYSICAL EXAMINATION: General appearance: alert, cooperative, fatigued and no distress Head: Normocephalic, without obvious abnormality, atraumatic Neck: no adenopathy, no JVD, supple, symmetrical, trachea midline and thyroid not enlarged, symmetric, no tenderness/mass/nodules Lymph nodes: Cervical, supraclavicular, and axillary nodes normal. Resp: clear to auscultation bilaterally Back: symmetric, no curvature. ROM normal. No CVA tenderness. Cardio: regular rate and rhythm, S1, S2 normal, no murmur, click, rub or gallop GI: soft, non-tender; bowel sounds normal; no masses,  no  organomegaly Extremities: extremities normal, atraumatic, no cyanosis or edema  ECOG PERFORMANCE STATUS: 1 - Symptomatic but completely ambulatory  Blood pressure (!) 153/84, pulse 88, temperature 98.3 F (36.8 C), temperature source Oral, resp. rate 18, height 5\' 3"  (1.6 m), weight 250 lb 4.8 oz (113.5 kg), SpO2 97 %.  LABORATORY DATA: Lab Results  Component Value Date   WBC 8.2 09/05/2017   HGB 13.2 09/05/2017   HCT 39.6 09/05/2017   MCV 85.7 09/05/2017   PLT 301 09/05/2017      Chemistry      Component Value Date/Time   NA 136 05/20/2017 1848   K 3.5 05/20/2017 1848   CL 101 05/20/2017 1848   CO2 25 05/20/2017 1848   BUN 14 05/20/2017 1848   CREATININE 0.93 05/20/2017 1848      Component Value Date/Time   CALCIUM 8.8 (L) 05/20/2017 1848   ALKPHOS 121 (H) 02/06/2014 1333   AST 21 02/06/2014 1333   ALT 17 02/06/2014 1333   BILITOT 0.3 02/06/2014 1333       RADIOGRAPHIC STUDIES: No results found.  ASSESSMENT AND PLAN: This is a very pleasant 72 years old white female with history of iron deficiency anemia in addition to multiple other comorbidities.  She was treated with Feraheme infusion and had significant improvement in her hemoglobin and hematocrit as well as iron study and ferritin. She had repeat CBC today that showed normal hemoglobin and hematocrit.  Iron study and ferritin are still pending. I discussed the lab results with the patient and recommended for her to continue on observation with repeat CBC, iron study and ferritin in 6 months.   She was advised to call immediately if she has any concerning symptoms in the interval. The patient voices understanding of current disease status and treatment options and is in agreement with the current care plan.  All questions were answered. The patient knows to call the clinic with any problems, questions or concerns. We can certainly see the patient much sooner if necessary.  I spent 10 minutes counseling the  patient face to face. The total time spent in the appointment was 15 minutes.  Disclaimer: This note was dictated with voice recognition software. Similar sounding words can inadvertently be transcribed and may not be corrected upon review.

## 2017-09-05 NOTE — Pre-Procedure Instructions (Addendum)
Misty FlossLizann [name]  09/05/2017      [facility] [place] [place] [address] Phone: [phone] Fax: [phone]    Your procedure is scheduled on Fri., Aug. 2, 2019 from 12:08PM-1:03PM  [facility] Admitting [facility] "A" at 10:00AM  Call this number if you have problems the morning of surgery:  [phone]   Remember:  Do not eat or drink after midnight on Aug. 1st    Take these medicines the morning of surgery with A SIP OF WATER: Esomeprazole (NEXIUM), Pregabalin (LYRICA), Terbinafine (LAMISIL),Tiotropium Bromide-Olodaterol (STIOLTO RESPIMAT), CycloSPORINE (RESTASIS), Docusate sodium (COLACE), HydrOXYzine (ATARAX/VISTARIL), and Fluticasone (FLONASE).  If needed Meclizine (ANTIVERT), ALPRAZolam Prudy Feeler(XANAX), NitroGLYCERIN (NITROSTAT) (Notify the nurse if you had to take this medicine), and Albuterol Inhaler (Bring with you the day of surgery)  7 days before surgery stop taking all Other Aspirin Products, Vitamins, Fish oils, and Herbal medications. Also stop all NSAIDS i.e. Advil, Ibuprofen, Motrin, Aleve, Anaprox, Naproxen, BC, Goody Powders, and all Supplements.    Do not wear jewelry, make-up or nail polish.  Do not wear lotions, powders, or perfumes, or deodorant.  Do not shave 48 hours prior to surgery.    Do not bring valuables to the hospital.  [facility] is not responsible for any belongings or valuables.  Contacts, dentures or bridgework may not be worn into surgery.  Leave your suitcase in the car.  After surgery it may be brought to your room.  For patients admitted to the hospital, discharge time will be determined by your treatment team.  Patients discharged the day of surgery will not be allowed to drive home.   Special instructions: [place]- Preparing For Surgery  Before surgery, you can play an important role. Because skin is not sterile, your skin needs to be as  free of germs as possible. You can reduce the number of germs on your skin by washing with CHG (chlorahexidine gluconate) Soap before surgery.  CHG is an antiseptic cleaner which kills germs and bonds with the skin to continue killing germs even after washing.    Oral Hygiene is also important to reduce your risk of infection.  Remember - BRUSH YOUR TEETH THE MORNING OF SURGERY WITH YOUR REGULAR TOOTHPASTE  Please do not use if you have an allergy to CHG or antibacterial soaps. If your skin becomes reddened/irritated stop using the CHG.  Do not shave (including legs and underarms) for at least 48 hours prior to first CHG shower. It is OK to shave your face.  Please follow these instructions carefully.   1. Shower the NIGHT BEFORE SURGERY and the MORNING OF SURGERY with CHG.   2. If you chose to wash your hair, wash your hair first as usual with your normal shampoo.  3. After you shampoo, rinse your hair and body thoroughly to remove the shampoo.  4. Use CHG as you would any other liquid soap. You can apply CHG directly to the skin and wash gently with a scrungie or a clean washcloth.   5. Apply the CHG Soap to your body ONLY FROM THE NECK DOWN.  Do not use on open wounds or open sores. Avoid contact with your eyes, ears, mouth and genitals (private parts). Wash Face and genitals (private parts)  with your normal soap.  6. Wash thoroughly, paying special attention to the area where your surgery will be performed.  7. Thoroughly rinse your body  with warm water from the neck down.  8. DO NOT shower/wash with your normal soap after using and rinsing off the CHG Soap.  9. Pat yourself dry with a CLEAN TOWEL.  10. Wear CLEAN PAJAMAS to bed the night before surgery, wear comfortable clothes the morning of surgery  11. Place CLEAN SHEETS on your bed the night of your first shower and DO NOT SLEEP WITH PETS.  Day of Surgery:  Do not apply any deodorants/lotions.  Please wear clean clothes to  the hospital/surgery center.   Remember to brush your teeth WITH YOUR REGULAR TOOTHPASTE.  Please read over the following fact sheets that you were given. Pain Booklet, Coughing and Deep Breathing, MRSA Information and Surgical Site Infection Prevention

## 2017-09-05 NOTE — Telephone Encounter (Signed)
Printed avs and calender of upcoming appointment. Per 7/29 los 

## 2017-09-06 ENCOUNTER — Encounter (HOSPITAL_COMMUNITY)
Admission: RE | Admit: 2017-09-06 | Discharge: 2017-09-06 | Disposition: A | Discharge status: Home / Self Care | Payer: Medicare Other | Source: Ambulatory Visit | Attending: Anesthesiology | Admitting: Anesthesiology

## 2017-09-06 ENCOUNTER — Encounter (HOSPITAL_COMMUNITY): Payer: Self-pay

## 2017-09-06 ENCOUNTER — Other Ambulatory Visit: Payer: Self-pay

## 2017-09-06 DIAGNOSIS — F319 Bipolar disorder, unspecified: Secondary | ICD-10-CM | POA: Diagnosis not present

## 2017-09-06 DIAGNOSIS — Z885 Allergy status to narcotic agent status: Secondary | ICD-10-CM | POA: Diagnosis not present

## 2017-09-06 DIAGNOSIS — I10 Essential (primary) hypertension: Secondary | ICD-10-CM | POA: Diagnosis not present

## 2017-09-06 DIAGNOSIS — Z79899 Other long term (current) drug therapy: Secondary | ICD-10-CM | POA: Diagnosis not present

## 2017-09-06 DIAGNOSIS — M1990 Unspecified osteoarthritis, unspecified site: Secondary | ICD-10-CM | POA: Diagnosis not present

## 2017-09-06 DIAGNOSIS — Z981 Arthrodesis status: Secondary | ICD-10-CM | POA: Diagnosis not present

## 2017-09-06 DIAGNOSIS — Z6841 Body Mass Index (BMI) 40.0 and over, adult: Secondary | ICD-10-CM | POA: Diagnosis not present

## 2017-09-06 DIAGNOSIS — Y753 Surgical instruments, materials and neurological devices (including sutures) associated with adverse incidents: Secondary | ICD-10-CM | POA: Diagnosis not present

## 2017-09-06 DIAGNOSIS — Z86718 Personal history of other venous thrombosis and embolism: Secondary | ICD-10-CM | POA: Diagnosis not present

## 2017-09-06 DIAGNOSIS — Z888 Allergy status to other drugs, medicaments and biological substances status: Secondary | ICD-10-CM | POA: Diagnosis not present

## 2017-09-06 DIAGNOSIS — M961 Postlaminectomy syndrome, not elsewhere classified: Secondary | ICD-10-CM | POA: Diagnosis not present

## 2017-09-06 DIAGNOSIS — K219 Gastro-esophageal reflux disease without esophagitis: Secondary | ICD-10-CM | POA: Diagnosis not present

## 2017-09-06 DIAGNOSIS — Z87891 Personal history of nicotine dependence: Secondary | ICD-10-CM | POA: Diagnosis not present

## 2017-09-06 DIAGNOSIS — J449 Chronic obstructive pulmonary disease, unspecified: Secondary | ICD-10-CM | POA: Diagnosis not present

## 2017-09-06 DIAGNOSIS — T85193A Other mechanical complication of implanted electronic neurostimulator, generator, initial encounter: Secondary | ICD-10-CM | POA: Diagnosis not present

## 2017-09-06 DIAGNOSIS — M797 Fibromyalgia: Secondary | ICD-10-CM | POA: Diagnosis not present

## 2017-09-06 HISTORY — DX: Chronic obstructive pulmonary disease, unspecified: J44.9

## 2017-09-06 HISTORY — DX: Lymphedema, not elsewhere classified: I89.0

## 2017-09-06 LAB — BASIC METABOLIC PANEL
Anion gap: 7 (ref 5–15)
BUN: 11 mg/dL (ref 8–23)
CALCIUM: 9.1 mg/dL (ref 8.9–10.3)
CO2: 28 mmol/L (ref 22–32)
CREATININE: 0.87 mg/dL (ref 0.44–1.00)
Chloride: 107 mmol/L (ref 98–111)
GFR calc Af Amer: >60 mL/min (ref >60)
GLUCOSE: 105 mg/dL — AB (ref 70–99)
POTASSIUM: 3.7 mmol/L (ref 3.5–5.1)
Sodium: 142 mmol/L (ref 135–145)

## 2017-09-06 LAB — SURGICAL PCR SCREEN
MRSA, PCR: NEGATIVE
Staphylococcus aureus: POSITIVE — AB

## 2017-09-06 NOTE — Progress Notes (Signed)
PCP - Hamilton Caprihao Le, DO Cardiologist - [facility] V/V-Dr. Early Pulm-Dr. Byrum  Chest x-ray - 04/19/17 EKG - pt left before EKG obtained; will get DOS Stress Test - 07/18/12 ECHO - 10/19/16 Cardiac Cath - denies  Sleep Study - denies  Aspirin Instructions: N/A  Anesthesia review: Yes  Patient denies shortness of breath, fever, cough and chest pain at PAT appointment   Patient verbalized understanding of instructions that were given to them at the PAT appointment. Patient was also instructed that they will need to review over the PAT instructions again at home before surgery.

## 2017-09-07 NOTE — Progress Notes (Signed)
Anesthesia Chart Review:  Case:  [id number] Date/Time:  09/09/17 0934   Procedure:  SPINAL CORD STIMULATOR BATTERY EXCHANGE (Right ) - SPINAL CORD STIMULATOR BATTERY EXCHANGE   Anesthesia type:  General   Pre-op diagnosis:  Malfunction of spinal cord stimulator   [facility]  [facility] ROOM 18 / [facility]   Surgeon:  Odette FractionHarkins, Paul, MD      DISCUSSION: Patient is a 72 year old female scheduled for the above procedure. History includes former smoker (quit '88), HTN (not requiring medication since 2011), COPD, asthma, chronic exertional dyspnea, vocal cord dysfunction, LLE DVT '10, fibromyalgia, lymphedema, Bipolar affective disorder. S/p ALIF 02/15/14, PLIF 06/01/11. BMI is consistent with obesity.  She has had recent cardiology and pulmonology follow-up with testing earlier this year as outlined below. She denied SOB, fever, cough, and chest pain at her PAT RN visit. She will be further evaluated by her assigned anesthesiologist on the day of surgery, but if no acute changes then I would anticipate that she can proceed as planned.   VS: BP 130/74   Pulse 83   Temp 36.8 C   Resp 18   Ht 5\' 3"  (1.6 m)   Wt 250 lb (113.4 kg)   SpO2 94%   BMI 44.29 kg/m   PROVIDERS: - Le, Thao P, DO is PCP. Florian Buff- Vyas, Chandra, MD is cardiologist [facility]). Last visit 08/02/17.  She had a nonischemic stress test earlier this year.  Echo showed grade 3 diastolic dysfunction.  She has chronic lower extremity edema, worse on the left.  Recent duplex negative for DVT.  She also has been evaluated by vascular surgery.  Dr. Sherril CroonVyas recommended increasing furosemide, good blood pressure control, keeping legs elevated when lying down. She has not been able to use compression stockings due to arthritis and trouble applying. Six month follow-up recommended. Levy Pupa- Byrum, Robert, MD is pulmonologist. Last visit 08/31/17. Dyspnea felt likely due to her obesity and deconditioning.  Gretta Began- Early, Todd, MD is vascular surgeon. Last visit  06/14/17 for progressive lymphedema.  - Si GaulMohamed, Mohamed, MD is hematologist. Last visit 09/05/17 for microcytic anemia due to iron deficiency. Received Feraheme infusion x2 04/2017.     LABS: Latest labs show: Lab Results  Component Value Date   WBC 8.2 09/05/2017   HGB 13.2 09/05/2017   HCT 39.6 09/05/2017   PLT 301 09/05/2017   GLUCOSE 105 (H) 09/06/2017   ALT 17 02/06/2014   AST 21 02/06/2014   NA 142 09/06/2017   K 3.7 09/06/2017   CL 107 09/06/2017   CREATININE 0.87 09/06/2017   BUN 11 09/06/2017   CO2 28 09/06/2017    OTHER: PFTs 04/13/17: PRE: FVC 2.48 (88%), FEV1 1.89 (89%); POST-BD FVC 2.32 (82%), FEV1 1.83 (86%). TLC 4.70 (97%). DLCO cor 17.40 (78%), unc 15.56 (69%).    IMAGES: CXR 04/19/17: IMPRESSION: COPD with LEFT lung base atelectasis/scarring. Borderline cardiomegaly.   EKG: 03/04/17 [facility]): NSR, incomplete right BBB. Low voltage. Borderline LAD.    CV: Echo 03/17/17 [facility]):  Conclusions: 1.  Left ventricle cavity is normal in size.  Moderate concentric hypertrophy of the left ventricle.  Normal global wall motion.  Doppler evidence of grade 3 (restrictive) diastolic dysfunction, elevated LAP.  Calculated EF 56%. 2.  Left atrial cavity is mildly dilated. 3.  Mild tricuspid regurgitation.  Estimated pulmonary artery systolic pressure 28 mmHg.  Nuclear stress test 03/14/17 [facility]): Impression: 1.  The resting electrocardiogram demonstrated normal sinus rhythm right bundle branch block.  Low voltage and nonspecific T change.  Stress EKG is nondiagnostic for ischemia as it is a pharmacologic stress using Lexiscan.  Stress symptoms include dyspnea and dizziness. 2.  Myocardial perfusion imaging is normal.  Overall left ventricular systolic function was normal without regional wall motion abnormalities.  The left ventricular ejection fraction was 64%.  This is a low risk study.    Cardiopulmonary exercise stress test 07/18/12: Conclusion: Exercise  testing with gas exchange demonstrates a normal functional capacity when compared to matched sedentary norms. There is a relative hyperventilation near peak exercise (relatively steep RCP), when VE/VCO2 is corrected to the RCP the slope remains significantly elevated at 37. Baseline EKG SR with incomplete right BBB.  No ST-T changes or arrhythmias. BP response was appropriate. The elevated VE/VCO2 slope and low HR response are concerning for an underlying circulatory limitation as primary problem. The most common type in obese individuals is diastolic dysfunction. In addition, Air flow limitation and obesity could be contributing.   BLE venous U/S 03/17/17 [facility]): No evidence of deep vein thrombosis of the lower extremities with normal venous return.   Past Medical History:  Diagnosis Date  . Anxiety   . Asthma   . Bipolar affective disorder (HCC)   . COPD (chronic obstructive pulmonary disease) (HCC)   . Cough   . Depression   . DJD (degenerative joint disease) of lumbar spine   . DVT (deep venous thrombosis) (HCC) 2010   groin left - on coumadin for 6 months  . Family history of anesthesia complication    Hx: father has nausea  . Fibromyalgia   . GERD (gastroesophageal reflux disease)   . Hypertension    Hx: of in 2011  . Left leg DVT (HCC) 2010  . Lymphedema 2019  . Migraine   . Pneumonia   . PONV (postoperative nausea and vomiting)    and migraines  . Shortness of breath    exertion  . Spinal cord stimulator dysfunction (HCC)    has one in,but not working  . Spinal headache   . Vertigo    Hx: of  . Vocal cord dysfunction    sees dr. Delton Coombes    Past Surgical History:  Procedure Laterality Date  . ABDOMINAL HYSTERECTOMY  1980   partial   . ANTERIOR LAT LUMBAR FUSION Left 02/15/2014   Procedure: ANTERIOR LATERAL LUMBAR INTERBODY FUSION LUMBAR TWO-THREE,LUMBAR THREE-FOUR WITH LATERAL PLATE.;  Surgeon: Temple Pacini, MD;  Location: [facility];  Service: Neurosurgery;   Laterality: Left;  left   . APPENDECTOMY    . BACK SURGERY    . BREAST ENHANCEMENT SURGERY  1989  . CARPAL TUNNEL RELEASE  2006   Right hand  . CATARACT EXTRACTION W/ INTRAOCULAR LENS  IMPLANT, BILATERAL     Hx: of  . CERVICAL FUSION  2005  . CHOLECYSTECTOMY  1989  . COLONOSCOPY W/ BIOPSIES AND POLYPECTOMY     Hx: of  . FINGER ARTHROSCOPY WITH CARPOMETACARPEL (CMC) ARTHROPLASTY     thumb  . FRACTURE SURGERY Right 09/2013   ankle and leg - plate and screws  . LUMBAR LAMINECTOMY/DECOMPRESSION MICRODISCECTOMY Right 11/06/2012   Procedure: LUMBAR TWO THREE LUMBAR LAMINECTOMY/DECOMPRESSION MICRODISCECTOMY 1 LEVEL;  Surgeon: Temple Pacini, MD;  Location[facility];  Service: Neurosurgery;  Laterality: Right;  . ORIF ANKLE FRACTURE Right 2015  . ORIF TIBIA FRACTURE Right 2015  . PARTIAL HYSTERECTOMY  1980  . ROTATOR CUFF REPAIR Right 2017  . SHOULDER ARTHROSCOPY    .  SPINAL CORD STIMULATOR IMPLANT  2010  . SPINAL FUSION  2018   L1, L2  . TARSAL SUSPENSION     Hx; of left thumb  . TONSILLECTOMY  1955  . UPPER GI ENDOSCOPY     Hx: of    MEDICATIONS: . albuterol (PROVENTIL HFA;VENTOLIN HFA) 108 (90 BASE) MCG/ACT inhaler  . ALPRAZolam (XANAX) 1 MG tablet  . AMITIZA 24 MCG capsule  . ARIPiprazole (ABILIFY) 30 MG tablet  . Ascorbic Acid (VITAMIN C GUMMIE PO)  . butalbital-acetaminophen-caffeine (FIORICET, ESGIC) 50-325-40 MG per tablet  . calcitonin, salmon, (MIACALCIN/FORTICAL) 200 UNIT/ACT nasal spray  . calcium carbonate (OSCAL) 1500 (600 Ca) MG TABS tablet  . Cholecalciferol (VITAMIN D) 2000 units CAPS  . cyclobenzaprine (FLEXERIL) 10 MG tablet  . cycloSPORINE (RESTASIS) 0.05 % ophthalmic emulsion  . docusate sodium (COLACE) 100 MG capsule  . esomeprazole (NEXIUM) 40 MG capsule  . ezetimibe (ZETIA) 10 MG tablet  . fluticasone (FLONASE) 50 MCG/ACT nasal spray  . furosemide (LASIX) 40 MG tablet  . HYDROmorphone (DILAUDID) 2 MG tablet  . hydrOXYzine (ATARAX/VISTARIL) 25 MG  tablet  . lamoTRIgine (LAMICTAL) 150 MG tablet  . lisdexamfetamine (VYVANSE) 70 MG capsule  . Loratadine 10 MG CAPS  . meclizine (ANTIVERT) 25 MG tablet  . Melatonin 300 MCG TABS  . nitroGLYCERIN (NITROSTAT) 0.4 MG SL tablet  . omeprazole (PRILOSEC) 20 MG capsule  . Polyethyl Glycol-Propyl Glycol (SYSTANE OP)  . Potassium Gluconate 595 MG CAPS  . pramipexole (MIRAPEX) 0.5 MG tablet  . pregabalin (LYRICA) 100 MG capsule  . pregabalin (LYRICA) 150 MG capsule  . spironolactone (ALDACTONE) 50 MG tablet  . terbinafine (LAMISIL) 250 MG tablet  . Tiotropium Bromide-Olodaterol (STIOLTO RESPIMAT) 2.5-2.5 MCG/ACT AERS  . Tiotropium Bromide-Olodaterol (STIOLTO RESPIMAT) 2.5-2.5 MCG/ACT AERS  . triamcinolone (KENALOG) 0.025 % cream  . vitamin B-12 (CYANOCOBALAMIN) 1000 MCG tablet   No current facility-administered medications for this encounter.     Velna Ochs [facility] Short Stay Center/Anesthesiology Phone [phone] 09/07/2017 6:23 PM

## 2017-09-08 NOTE — H&P (Signed)
[name] is an 72 y.o. female.   Chief Complaint:  "my spinal cord stimulator battery is not working" HPI: Patient with a history of multiple lumbar surgeries call all done by my partner Dr. Jordan Likes; she also has had placement of a spinal cord stimulator after positive trial by Dr. Vear Clock some 8 or 9 years ago.  Stimulator was a [employer], placed by Dr. Flo Shanks.  Her first battery lasted 3 years; 2nd battery lasted about 5 years.  She reports that is been nonfunctional for about a year to year and half.  She can't identify when her battery failed.  She reports that when functional, her stimulator provides with greater than 50% pain relief for her back and leg pain.  In the last few years, patient has developed lymphedema, and reports any number of wound infections and cellulitis.  She has numerous cats, 2 dogs, and reports being told as well that her COPD is exacerbated by her environment.  There is some indication that many of her infections of either been caused by or exacerbated by animals.  Nonetheless, she comes referred for replacement of her IPG which is located on her right side.   Past Medical History:  Diagnosis Date  . Anxiety   . Asthma   . Bipolar affective disorder (HCC)   . COPD (chronic obstructive pulmonary disease) (HCC)   . Cough   . Depression   . DJD (degenerative joint disease) of lumbar spine   . DVT (deep venous thrombosis) (HCC) 2010   groin left - on coumadin for 6 months  . Family history of anesthesia complication    Hx: father has nausea  . Fibromyalgia   . GERD (gastroesophageal reflux disease)   . Hypertension    Hx: of in 2011  . Left leg DVT (HCC) 2010  . Lymphedema 2019  . Migraine   . Pneumonia   . PONV (postoperative nausea and vomiting)    and migraines  . Shortness of breath    exertion  . Spinal cord stimulator dysfunction (HCC)    has one in,but not working  . Spinal headache   . Vertigo    Hx: of  . Vocal cord dysfunction    sees dr. Delton Coombes    Past Surgical History:  Procedure Laterality Date  . ABDOMINAL HYSTERECTOMY  1980   partial   . ANTERIOR LAT LUMBAR FUSION Left 02/15/2014   Procedure: ANTERIOR LATERAL LUMBAR INTERBODY FUSION LUMBAR TWO-THREE,LUMBAR THREE-FOUR WITH LATERAL PLATE.;  Surgeon: Temple Pacini, MD;  Location: [facility];  Service: Neurosurgery;  Laterality: Left;  left   . APPENDECTOMY    . BACK SURGERY    . BREAST ENHANCEMENT SURGERY  1989  . CARPAL TUNNEL RELEASE  2006   Right hand  . CATARACT EXTRACTION W/ INTRAOCULAR LENS  IMPLANT, BILATERAL     Hx: of  . CERVICAL FUSION  2005  . CHOLECYSTECTOMY  1989  . COLONOSCOPY W/ BIOPSIES AND POLYPECTOMY     Hx: of  . FINGER ARTHROSCOPY WITH CARPOMETACARPEL (CMC) ARTHROPLASTY     thumb  . FRACTURE SURGERY Right 09/2013   ankle and leg - plate and screws  . LUMBAR LAMINECTOMY/DECOMPRESSION MICRODISCECTOMY Right 11/06/2012   Procedure: LUMBAR TWO THREE LUMBAR LAMINECTOMY/DECOMPRESSION MICRODISCECTOMY 1 LEVEL;  Surgeon: Temple Pacini, MD;  Location: [facility];  Service: Neurosurgery;  Laterality: Right;  . ORIF ANKLE FRACTURE Right 2015  . ORIF TIBIA FRACTURE Right 2015  . PARTIAL HYSTERECTOMY  1980  .  ROTATOR CUFF REPAIR Right 2017  . SHOULDER ARTHROSCOPY    . SPINAL CORD STIMULATOR IMPLANT  2010  . SPINAL FUSION  2018   L1, L2  . TARSAL SUSPENSION     Hx; of left thumb  . TONSILLECTOMY  1955  . UPPER GI ENDOSCOPY     Hx: of    Family History  Problem Relation Age of Onset  . Lung cancer Mother   . Prostate cancer Father   . Heart disease Father   . Rheum arthritis Paternal Grandmother   . Asthma Sister    Social History:  reports that she quit smoking about 31 years ago. Her smoking use included cigarettes. She has a 52.50 pack-year smoking history. She has never used smokeless tobacco. She reports that she does not drink alcohol or use drugs.  Allergies:  Allergies  Allergen Reactions  . Lisinopril Other (See Comments)     kidney failure  . Lithium Other (See Comments)    Migraines and "drunk"  . Statins Other (See Comments)    Very weak and achy  . Adhesive [Tape] Rash    All tape, paper and steri-strips included  . Codeine Nausea And Vomiting  . Scopolamine Other (See Comments)    Causes vertigo    Medications Prior to Admission  Medication Sig Dispense Refill  . ALPRAZolam (XANAX) 1 MG tablet Take 2 mg by mouth at bedtime. May take a dose of 1 mg during the day if needed for anxiety    . AMITIZA 24 MCG capsule     . ARIPiprazole (ABILIFY) 30 MG tablet Take 30 mg by mouth at bedtime.  5  . Ascorbic Acid (VITAMIN C GUMMIE PO) Take 1 tablet by mouth daily after breakfast.     . butalbital-acetaminophen-caffeine (FIORICET, ESGIC) 50-325-40 MG per tablet Take 2 tablets by mouth every 4 (four) hours as needed for headache or migraine.     . calcitonin, salmon, (MIACALCIN/FORTICAL) 200 UNIT/ACT nasal spray Place 1 spray into alternate nostrils daily after breakfast.     . calcium carbonate (OSCAL) 1500 (600 Ca) MG TABS tablet Take 600 mg of elemental calcium by mouth daily with breakfast.    . Cholecalciferol (VITAMIN D) 2000 units CAPS Take 2,000 Units by mouth daily after breakfast.    . cyclobenzaprine (FLEXERIL) 10 MG tablet Take 20 mg by mouth at bedtime.     . cycloSPORINE (RESTASIS) 0.05 % ophthalmic emulsion Place 1 drop into both eyes 2 (two) times daily.    Marland Kitchen docusate sodium (COLACE) 100 MG capsule Take 200 mg by mouth 2 (two) times daily.    Marland Kitchen esomeprazole (NEXIUM) 40 MG capsule Take 40 mg by mouth daily before breakfast.      . ezetimibe (ZETIA) 10 MG tablet Take 10 mg by mouth at bedtime.    . fluticasone (FLONASE) 50 MCG/ACT nasal spray Place 2 sprays into both nostrils 2 (two) times daily. 16 g 11  . furosemide (LASIX) 40 MG tablet Take 40 mg by mouth 4 (four) times daily.     Marland Kitchen HYDROmorphone (DILAUDID) 2 MG tablet Take 0.5-1 tablets (1-2 mg total) by mouth 2 (two) times daily. 1mg  in the am and  2mg  at bedtime. (Patient taking differently: Take 2-4 mg by mouth See admin instructions. 2 mg in the morning and 4 mg at bedtime.) 60 tablet 0  . hydrOXYzine (ATARAX/VISTARIL) 25 MG tablet Take 25-50 mg by mouth 2 (two) times daily. 25 mg every morning, 50 mg every night    .  lamoTRIgine (LAMICTAL) 150 MG tablet Take 300 mg by mouth at bedtime.    Marland Kitchen. lisdexamfetamine (VYVANSE) 70 MG capsule Take 70 mg by mouth daily after breakfast.     . Loratadine 10 MG CAPS Take 10 mg by mouth at bedtime.     . meclizine (ANTIVERT) 25 MG tablet Take 25 mg by mouth 3 (three) times daily as needed for dizziness.     . Melatonin 300 MCG TABS Take 300 mcg by mouth at bedtime.     . nitroGLYCERIN (NITROSTAT) 0.4 MG SL tablet Place 0.4 mg under the tongue every 5 (five) minutes as needed for chest pain.    Marland Kitchen. omeprazole (PRILOSEC) 20 MG capsule Take 20 mg by mouth at bedtime.      Bertram Gala. Polyethyl Glycol-Propyl Glycol (SYSTANE OP) Place 1 drop into both eyes at bedtime.    . Potassium Gluconate 595 MG CAPS Take 1 capsule by mouth 2 (two) times daily.    . pramipexole (MIRAPEX) 0.5 MG tablet Take 0.5 mg by mouth at bedtime.      . pregabalin (LYRICA) 100 MG capsule Take 100 mg by mouth daily after breakfast.    . pregabalin (LYRICA) 150 MG capsule Take 300 mg by mouth at bedtime.    Marland Kitchen. spironolactone (ALDACTONE) 50 MG tablet Take 50 mg by mouth at bedtime.     . terbinafine (LAMISIL) 250 MG tablet Take 250 mg by mouth daily after breakfast.    . Tiotropium Bromide-Olodaterol (STIOLTO RESPIMAT) 2.5-2.5 MCG/ACT AERS Inhale 2 puffs into the lungs daily. 1 Inhaler 5  . Tiotropium Bromide-Olodaterol (STIOLTO RESPIMAT) 2.5-2.5 MCG/ACT AERS Inhale 2 puffs into the lungs daily. 1 Inhaler 5  . triamcinolone (KENALOG) 0.025 % cream Apply 1 application topically 2 (two) times daily as needed (itching).    . vitamin B-12 (CYANOCOBALAMIN) 1000 MCG tablet Take 1,000 mcg by mouth daily after breakfast.    . albuterol (PROVENTIL  HFA;VENTOLIN HFA) 108 (90 BASE) MCG/ACT inhaler Inhale 2 puffs into the lungs every 6 (six) hours as needed for wheezing.      No results found for this or any previous visit (from the past 48 hour(s)). No results found.  Review of Systems  Constitutional: Negative.   HENT: Negative.   Eyes: Negative.   Respiratory: Positive for shortness of breath. Negative for cough and hemoptysis.   Cardiovascular: Negative.   Gastrointestinal: Negative.   Genitourinary: Negative.   Musculoskeletal: Positive for back pain.  Skin: Positive for itching.  Neurological: Negative.   Endo/Heme/Allergies: Negative.   Psychiatric/Behavioral: Negative.     Blood pressure (!) 146/63, pulse 81, temperature 98 F (36.7 C), temperature source Oral, resp. rate 20, height 5\' 3"  (1.6 m), weight 113.4 kg (250 lb), SpO2 100 %. Physical Exam  Vitals reviewed. Constitutional: She is oriented to person, place, and time. She appears well-developed and well-nourished.  HENT:  Head: Normocephalic.  Eyes: Pupils are equal, round, and reactive to light. Conjunctivae are normal.  Neck: Normal range of motion.  Cardiovascular: Normal rate.  Respiratory: Effort normal.  GI: Soft.  Musculoskeletal: Normal range of motion.  Neurological: She is alert and oriented to person, place, and time.  Skin: Skin is warm and dry.  Psychiatric: She has a normal mood and affect. Her behavior is normal. Thought content normal.     Assessment/Plan  lumbar post-laminectomy syndrome, with failed S CS battery  PLAN:  Battery replacement.  Patient is aware that if there technical complications with replacing the leads into the  new battery, or the leads do not seem to function afterwards, she may need to return to see her neurosurgeon for system replacement/revision.  She is also aware that given her current health conditions and environmental conditions that she is at high risk for infection, and wound healing complications.  Gwynne Edinger, MD 09/09/2017, 9:47 AM

## 2017-09-09 ENCOUNTER — Ambulatory Visit (HOSPITAL_COMMUNITY): Payer: Medicare Other | Admitting: Vascular Surgery

## 2017-09-09 ENCOUNTER — Ambulatory Visit (HOSPITAL_COMMUNITY)
Admission: RE | Admit: 2017-09-09 | Discharge: 2017-09-09 | Disposition: A | Discharge status: Home / Self Care | Payer: Medicare Other | Source: Ambulatory Visit | Attending: Anesthesiology | Admitting: Anesthesiology

## 2017-09-09 ENCOUNTER — Encounter (HOSPITAL_COMMUNITY)
Admission: RE | Disposition: A | Discharge status: Home / Self Care | Payer: Self-pay | Source: Ambulatory Visit | Attending: Anesthesiology

## 2017-09-09 ENCOUNTER — Ambulatory Visit (HOSPITAL_COMMUNITY): Payer: Medicare Other | Admitting: Anesthesiology

## 2017-09-09 ENCOUNTER — Encounter (HOSPITAL_COMMUNITY): Payer: Self-pay | Admitting: Anesthesiology

## 2017-09-09 DIAGNOSIS — Z87891 Personal history of nicotine dependence: Secondary | ICD-10-CM | POA: Insufficient documentation

## 2017-09-09 DIAGNOSIS — I10 Essential (primary) hypertension: Secondary | ICD-10-CM | POA: Insufficient documentation

## 2017-09-09 DIAGNOSIS — T85193A Other mechanical complication of implanted electronic neurostimulator, generator, initial encounter: Secondary | ICD-10-CM | POA: Diagnosis not present

## 2017-09-09 DIAGNOSIS — M961 Postlaminectomy syndrome, not elsewhere classified: Secondary | ICD-10-CM | POA: Insufficient documentation

## 2017-09-09 DIAGNOSIS — M797 Fibromyalgia: Secondary | ICD-10-CM | POA: Insufficient documentation

## 2017-09-09 DIAGNOSIS — Z888 Allergy status to other drugs, medicaments and biological substances status: Secondary | ICD-10-CM | POA: Insufficient documentation

## 2017-09-09 DIAGNOSIS — Z86718 Personal history of other venous thrombosis and embolism: Secondary | ICD-10-CM | POA: Insufficient documentation

## 2017-09-09 DIAGNOSIS — Z6841 Body Mass Index (BMI) 40.0 and over, adult: Secondary | ICD-10-CM | POA: Insufficient documentation

## 2017-09-09 DIAGNOSIS — Z885 Allergy status to narcotic agent status: Secondary | ICD-10-CM | POA: Insufficient documentation

## 2017-09-09 DIAGNOSIS — J449 Chronic obstructive pulmonary disease, unspecified: Secondary | ICD-10-CM | POA: Insufficient documentation

## 2017-09-09 DIAGNOSIS — K219 Gastro-esophageal reflux disease without esophagitis: Secondary | ICD-10-CM | POA: Insufficient documentation

## 2017-09-09 DIAGNOSIS — M1990 Unspecified osteoarthritis, unspecified site: Secondary | ICD-10-CM | POA: Insufficient documentation

## 2017-09-09 DIAGNOSIS — Z79899 Other long term (current) drug therapy: Secondary | ICD-10-CM | POA: Insufficient documentation

## 2017-09-09 DIAGNOSIS — Y753 Surgical instruments, materials and neurological devices (including sutures) associated with adverse incidents: Secondary | ICD-10-CM | POA: Insufficient documentation

## 2017-09-09 DIAGNOSIS — F319 Bipolar disorder, unspecified: Secondary | ICD-10-CM | POA: Diagnosis not present

## 2017-09-09 DIAGNOSIS — Z981 Arthrodesis status: Secondary | ICD-10-CM | POA: Insufficient documentation

## 2017-09-09 HISTORY — PX: SPINAL CORD STIMULATOR BATTERY EXCHANGE: SHX6202

## 2017-09-09 SURGERY — SPINAL CORD STIMULATOR BATTERY EXCHANGE
Anesthesia: General | Laterality: Right

## 2017-09-09 MED ORDER — BACITRACIN ZINC 500 UNIT/GM EX OINT
TOPICAL_OINTMENT | CUTANEOUS | Status: AC
  Filled 2017-09-09: qty 28.35

## 2017-09-09 MED ORDER — MIDAZOLAM HCL 5 MG/5ML IJ SOLN
INTRAMUSCULAR | Status: DC | PRN
  Administered 2017-09-09: 2 mg via INTRAVENOUS

## 2017-09-09 MED ORDER — VANCOMYCIN HCL 1000 MG IV SOLR
INTRAVENOUS | Status: DC | PRN
  Administered 2017-09-09: 1000 mg

## 2017-09-09 MED ORDER — VANCOMYCIN HCL 1000 MG IV SOLR
INTRAVENOUS | Status: AC
  Filled 2017-09-09: qty 1000

## 2017-09-09 MED ORDER — SUGAMMADEX SODIUM 200 MG/2ML IV SOLN
INTRAVENOUS | Status: DC | PRN
  Administered 2017-09-09: 200 mg via INTRAVENOUS

## 2017-09-09 MED ORDER — LACTATED RINGERS IV SOLN
INTRAVENOUS | Status: DC
  Administered 2017-09-09: 10 mL/h via INTRAVENOUS

## 2017-09-09 MED ORDER — ONDANSETRON HCL 4 MG/2ML IJ SOLN
INTRAMUSCULAR | Status: DC | PRN
  Administered 2017-09-09: 4 mg via INTRAVENOUS

## 2017-09-09 MED ORDER — PROPOFOL 10 MG/ML IV BOLUS
INTRAVENOUS | Status: DC | PRN
  Administered 2017-09-09: 200 mg via INTRAVENOUS

## 2017-09-09 MED ORDER — DEXAMETHASONE SODIUM PHOSPHATE 10 MG/ML IJ SOLN
INTRAMUSCULAR | Status: DC | PRN
  Administered 2017-09-09: 10 mg via INTRAVENOUS

## 2017-09-09 MED ORDER — LIDOCAINE-EPINEPHRINE 1 %-1:100000 IJ SOLN
INTRAMUSCULAR | Status: DC | PRN
  Administered 2017-09-09: 4 mL

## 2017-09-09 MED ORDER — EPHEDRINE SULFATE 50 MG/ML IJ SOLN
INTRAMUSCULAR | Status: DC | PRN
  Administered 2017-09-09: 15 mg via INTRAVENOUS
  Administered 2017-09-09: 5 mg via INTRAVENOUS
  Administered 2017-09-09 (×3): 10 mg via INTRAVENOUS

## 2017-09-09 MED ORDER — 0.9 % SODIUM CHLORIDE (POUR BTL) OPTIME
TOPICAL | Status: DC | PRN
  Administered 2017-09-09: 1000 mL

## 2017-09-09 MED ORDER — FENTANYL CITRATE (PF) 100 MCG/2ML IJ SOLN
INTRAMUSCULAR | Status: DC | PRN
  Administered 2017-09-09: 50 ug via INTRAVENOUS

## 2017-09-09 MED ORDER — LIDOCAINE-EPINEPHRINE 1 %-1:100000 IJ SOLN
INTRAMUSCULAR | Status: AC
  Filled 2017-09-09: qty 1

## 2017-09-09 MED ORDER — CHLORHEXIDINE GLUCONATE CLOTH 2 % EX PADS: 6.0000 | MEDICATED_PAD | Freq: Once | CUTANEOUS | Status: DC

## 2017-09-09 MED ORDER — HYDROMORPHONE HCL 2 MG PO TABS: 1.0000 mg | ORAL_TABLET | ORAL | 0 refills | Status: AC | PRN

## 2017-09-09 MED ORDER — ROCURONIUM BROMIDE 100 MG/10ML IV SOLN
INTRAVENOUS | Status: DC | PRN
  Administered 2017-09-09: 5 mg via INTRAVENOUS

## 2017-09-09 MED ORDER — LACTATED RINGERS IV SOLN: 100.0000 mL | INTRAVENOUS | 0 refills | Status: DC

## 2017-09-09 MED ORDER — FENTANYL CITRATE (PF) 250 MCG/5ML IJ SOLN
INTRAMUSCULAR | Status: AC
  Filled 2017-09-09: qty 5

## 2017-09-09 MED ORDER — LIDOCAINE HCL (CARDIAC) PF 100 MG/5ML IV SOSY
PREFILLED_SYRINGE | INTRAVENOUS | Status: DC | PRN
  Administered 2017-09-09: 60 mg via INTRAVENOUS

## 2017-09-09 MED ORDER — BUPIVACAINE-EPINEPHRINE (PF) 0.5% -1:200000 IJ SOLN
INTRAMUSCULAR | Status: AC
  Filled 2017-09-09: qty 30

## 2017-09-09 MED ORDER — PROPOFOL 10 MG/ML IV BOLUS
INTRAVENOUS | Status: AC
  Filled 2017-09-09: qty 20

## 2017-09-09 MED ORDER — SODIUM CHLORIDE 0.9 % IV SOLN
INTRAVENOUS | Status: DC | PRN
  Administered 2017-09-09: 10:00:00

## 2017-09-09 MED ORDER — MIDAZOLAM HCL 2 MG/2ML IJ SOLN
INTRAMUSCULAR | Status: AC
  Filled 2017-09-09: qty 2

## 2017-09-09 MED ORDER — EPHEDRINE 5 MG/ML INJ
INTRAVENOUS | Status: AC
  Filled 2017-09-09: qty 10

## 2017-09-09 MED ORDER — CEFAZOLIN SODIUM-DEXTROSE 2-4 GM/100ML-% IV SOLN
2.0000 g | INTRAVENOUS | Status: AC
  Administered 2017-09-09: 2 g via INTRAVENOUS
  Filled 2017-09-09: qty 100

## 2017-09-09 MED ORDER — BACITRACIN ZINC 500 UNIT/GM EX OINT
TOPICAL_OINTMENT | CUTANEOUS | Status: DC | PRN
  Administered 2017-09-09: 1 via TOPICAL

## 2017-09-09 SURGICAL SUPPLY — 39 items
ADH SKN CLS APL DERMABOND .7 (GAUZE/BANDAGES/DRESSINGS) ×1
BINDER ABD UNIV 12 45-62 (WOUND CARE) ×1 IMPLANT
BINDER ABDOMINAL 12 ML 46-62 (SOFTGOODS) ×1 IMPLANT
BINDER ABDOMINAL 46IN 62IN (WOUND CARE) ×2
BLADE CLIPPER SURG (BLADE) IMPLANT
CHLORAPREP W/TINT 26ML (MISCELLANEOUS) ×2 IMPLANT
DERMABOND ADVANCED (GAUZE/BANDAGES/DRESSINGS) ×1
DERMABOND ADVANCED .7 DNX12 (GAUZE/BANDAGES/DRESSINGS) ×1 IMPLANT
DRAPE LAPAROTOMY 100X72X124 (DRAPES) ×2 IMPLANT
DRSG OPSITE POSTOP 3X4 (GAUZE/BANDAGES/DRESSINGS) ×1 IMPLANT
GENERATOR NEUROSTIMULATOR (Neurostimulator) ×1 IMPLANT
GLOVE BIOGEL PI IND STRL 7.5 (GLOVE) ×1 IMPLANT
GLOVE BIOGEL PI INDICATOR 7.5 (GLOVE) ×1
GLOVE ECLIPSE 7.5 STRL STRAW (GLOVE) ×2 IMPLANT
GLOVE EXAM NITRILE LRG STRL (GLOVE) IMPLANT
GLOVE EXAM NITRILE XL STR (GLOVE) IMPLANT
GLOVE EXAM NITRILE XS STR PU (GLOVE) IMPLANT
GOWN STRL REUS W/ TWL LRG LVL3 (GOWN DISPOSABLE) IMPLANT
GOWN STRL REUS W/TWL LRG LVL3 (GOWN DISPOSABLE)
KIT BASIN OR (CUSTOM PROCEDURE TRAY) ×2 IMPLANT
KIT CHARGING (KITS) ×1
KIT CHARGING PRECISION NEURO (KITS) IMPLANT
KIT REMOTE CONTROL 112802 FREE (KITS) ×1 IMPLANT
KIT TURNOVER KIT B (KITS) ×2 IMPLANT
NDL HYPO 25X1 1.5 SAFETY (NEEDLE) ×1 IMPLANT
NEEDLE HYPO 25X1 1.5 SAFETY (NEEDLE) ×2 IMPLANT
PACK LAMINECTOMY NEURO (CUSTOM PROCEDURE TRAY) ×2 IMPLANT
PAD ARMBOARD 7.5X6 YLW CONV (MISCELLANEOUS) ×2 IMPLANT
SPONGE LAP 4X18 RFD (DISPOSABLE) IMPLANT
SUT MNCRL AB 4-0 PS2 18 (SUTURE) ×2 IMPLANT
SUT SILK 0 (SUTURE) ×2
SUT SILK 0 MO-6 18XCR BRD 8 (SUTURE) IMPLANT
SUT SILK 2 0 PERMA HAND 18 BK (SUTURE) IMPLANT
SUT VIC AB 2-0 CP2 18 (SUTURE) ×2 IMPLANT
SYR 10ML LL (SYRINGE) IMPLANT
SYR EPIDURAL 5ML GLASS (SYRINGE) IMPLANT
TOWEL GREEN STERILE (TOWEL DISPOSABLE) ×2 IMPLANT
TOWEL GREEN STERILE FF (TOWEL DISPOSABLE) ×2 IMPLANT
WATER STERILE IRR 1000ML POUR (IV SOLUTION) ×2 IMPLANT

## 2017-09-09 NOTE — Anesthesia Preprocedure Evaluation (Addendum)
Anesthesia Evaluation  Patient identified by MRN, date of birth, ID band Patient awake    Reviewed: Allergy & Precautions, NPO status , Patient's Chart, lab work & pertinent test results  History of Anesthesia Complications (+) PONV  Airway Mallampati: II  TM Distance: >3 FB Neck ROM: Full    Dental  (+) Upper Dentures, Lower Dentures   Pulmonary asthma , COPD,  COPD inhaler, former smoker,    breath sounds clear to auscultation + decreased breath sounds      Cardiovascular hypertension, Normal cardiovascular exam Rhythm:Regular Rate:Normal     Neuro/Psych negative neurological ROS  negative psych ROS   GI/Hepatic Neg liver ROS, GERD  Medicated,  Endo/Other  Morbid obesity  Renal/GU negative Renal ROS  negative genitourinary   Musculoskeletal negative musculoskeletal ROS (+)   Abdominal (+) + obese,   Peds negative pediatric ROS (+)  Hematology negative hematology ROS (+)   Anesthesia Other Findings   Reproductive/Obstetrics negative OB ROS                            Anesthesia Physical Anesthesia Plan  ASA: III  Anesthesia Plan: General   Post-op Pain Management:    Induction: Intravenous  PONV Risk Score and Plan: 3 and Ondansetron, Dexamethasone, Treatment may vary due to age or medical condition and Midazolam  Airway Management Planned: Oral ETT  Additional Equipment:   Intra-op Plan:   Post-operative Plan: Extubation in OR  Informed Consent: I have reviewed the patients History and Physical, chart, labs and discussed the procedure including the risks, benefits and alternatives for the proposed anesthesia with the patient or authorized representative who has indicated his/her understanding and acceptance.   Dental advisory given  Plan Discussed with: CRNA and Surgeon  Anesthesia Plan Comments: (5 mg versed pre-op/induction Bis monitoring)        Anesthesia  Quick Evaluation

## 2017-09-09 NOTE — Progress Notes (Signed)
Stimulator rep interacting/adjusting settings with patient

## 2017-09-09 NOTE — Discharge Instructions (Addendum)

## 2017-09-09 NOTE — Op Note (Signed)
1) lumbar post-laminectomy syndrome  2) chronic pain 3) failed SCS IPG POSTOP DX: same as preop PROCEDURES PERFORMED: IPG replacement--[employer] SURGEON:Jaimie Redditt  ASSISTANT: NONE  ANESTHESIA: GETA EBL: <10cc  DESCRIPTION OF PROCEDURE: After a discussion of risks, benefits and alternatives, informed consent was obtained. The patient was taken to the OR, general anesthesia induced by the anesthesia team without difficulty, turned into a right lateral decubitus position onto a Jackson table, all pressure points padded, SCD's placed. A timeout was taken to verify the correct patient, position, personnel, availability of appropriate equipment, and administration of perioperative antibiotics.   The flankareaoverlying the previously placed IPG waswidely prepped with chloraprep and draped into a sterile field. The skin and subcutaneous tissues around the patient's previous pocket incision was infiltrated with 0.25% bupivicaine 1:200K epinephrine. The subcutaneous pocket was incised with a 10 blade and using sharp, careful dissection the pocket opened and the IPG delivered onto the field. The pocket was inspected for hemostasis, which was found to be excellent. The leads were removed from the IPG. Scar tissue at the incision site was excised. The pocket was modified to be more anterior as the patient had requested. 2-0 silk anchor sutures were placed to fix the battery in its new location. Leads were fit into the new battery and contacts checked, with good results, and then the battery fixed into place with the anchor sutures.  The battery and leads were carefully positioned in the pocket, and theincision was copiously irrigated with bacitracin-containing irrigation.Vancomycin powder was placed into the pocket. The pocket incision was closed with a deeper layer of 2-0 vicryl interrupted sutures, and the skin closed with staples. Sterile dressings were applied. Needle, sponge, and instrument  counts were correct x2 at the end of the case.   COMPLICATIONS: NONE  CONDITION: Stable throughout the course of the procedure. DISPOSITION: Followup in clinic will be scheduled in 10-14 days.

## 2017-09-09 NOTE — Anesthesia Postprocedure Evaluation (Signed)
Anesthesia Post Note  Patient: Olene FlossLizann Rini  Procedure(s) Performed: SPINAL CORD STIMULATOR BATTERY EXCHANGE (Right )     Patient location during evaluation: PACU Anesthesia Type: General Level of consciousness: awake and alert Pain management: pain level controlled Vital Signs Assessment: post-procedure vital signs reviewed and stable Respiratory status: spontaneous breathing, nonlabored ventilation, respiratory function stable and patient connected to nasal cannula oxygen Cardiovascular status: blood pressure returned to baseline and stable Postop Assessment: no apparent nausea or vomiting Anesthetic complications: no    Last Vitals:  Vitals:   09/09/17 1115 09/09/17 1125  BP: (!) 102/55 105/60  Pulse: 70 70  Resp: 17 20  Temp:    SpO2: 95% 96%    Last Pain:  Vitals:   09/09/17 0809  TempSrc: Oral                 Nature Vogelsang S

## 2017-09-09 NOTE — Transfer of Care (Signed)
Immediate Anesthesia Transfer of Care Note  Patient: [name]  Procedure(s) Performed: SPINAL CORD STIMULATOR BATTERY EXCHANGE (Right )  Patient Location: PACU  Anesthesia Type:General  Level of Consciousness: awake, alert , oriented and patient cooperative  Airway & Oxygen Therapy: Patient Spontanous Breathing and Patient connected to nasal cannula oxygen  Post-op Assessment: Report given to RN and Post -op Vital signs reviewed and stable  Post vital signs: Reviewed and stable  Last Vitals:  Vitals Value Taken Time  BP 102/55 09/09/2017 11:12 AM  Temp    Pulse 71 09/09/2017 11:14 AM  Resp 19 09/09/2017 11:14 AM  SpO2 96 % 09/09/2017 11:14 AM  Vitals shown include unvalidated device data.  Last Pain:  Vitals:   09/09/17 0809  TempSrc: Oral         Complications: No apparent anesthesia complications

## 2017-09-12 ENCOUNTER — Encounter (HOSPITAL_COMMUNITY): Payer: Self-pay | Admitting: Anesthesiology

## 2017-11-21 ENCOUNTER — Ambulatory Visit (INDEPENDENT_AMBULATORY_CARE_PROVIDER_SITE_OTHER): Payer: Medicare Other | Admitting: Orthopedic Surgery

## 2017-11-21 ENCOUNTER — Telehealth (INDEPENDENT_AMBULATORY_CARE_PROVIDER_SITE_OTHER): Payer: Self-pay

## 2017-11-21 ENCOUNTER — Encounter (INDEPENDENT_AMBULATORY_CARE_PROVIDER_SITE_OTHER): Payer: Self-pay | Admitting: Orthopedic Surgery

## 2017-11-21 VITALS — Ht 63.0 in | Wt 250.0 lb

## 2017-11-21 DIAGNOSIS — M25521 Pain in right elbow: Secondary | ICD-10-CM

## 2017-11-21 DIAGNOSIS — I890 Lymphedema, not elsewhere classified: Secondary | ICD-10-CM

## 2017-11-21 DIAGNOSIS — T847XXA Infection and inflammatory reaction due to other internal orthopedic prosthetic devices, implants and grafts, initial encounter: Secondary | ICD-10-CM | POA: Diagnosis not present

## 2017-11-21 NOTE — Telephone Encounter (Signed)
Dr. Clovis Pu office would like for last office note to be faxed to [phone] when complete.  Cb# is [phone] ext. [phone].  Thank You

## 2017-11-21 NOTE — Telephone Encounter (Signed)
Dictation pending will fax once it is complete.

## 2017-11-22 ENCOUNTER — Encounter (INDEPENDENT_AMBULATORY_CARE_PROVIDER_SITE_OTHER): Payer: Self-pay | Admitting: Orthopedic Surgery

## 2017-11-22 NOTE — Progress Notes (Signed)
Office Visit Note   Patient: [name]           Date of Birth: [birth year]           MRN: [id number] Visit Date: 11/21/2017              Requested by: Lenell Antu, DO [address] [place], [place] [address] PCP: Lenell Antu, DO  Chief Complaint  Patient presents with  . Right Leg - Edema  . Right Elbow - Pain      HPI: Patient is a 72 year old woman who presents with multiple medical problems.  Patient states that she has cellulitis in her ankle she states she has had a history of fluid running out of her leg.  She states that the fluid drainage is worse when she wears the lymphedema pumps 3 hours a day.  Patient states she has been having clear drainage from a spinal cord stimulator on the right side.  Patient also complains of right elbow pain she states she uses her elbow to push herself up and to move around.  She complains of pain swelling and discoloration.  Patient also reports a history of significant osteoporosis which she states she is on vitamin D3 supplements and uses a nasal spray.  Assessment & Plan: Visit Diagnoses:  1. Hardware complicating wound infection, initial encounter (HCC)   2. Pain in right elbow   3. Lymphedema of both lower extremities     Plan: Recommend patient follow-up with Dr. Ollen Bowl for evaluation for changing out the spinal cord stimulator.  The stimulator is currently exposed with drainage.  Recommended wearing elbow pads for her right elbow patient states she has to use her elbow to move herself around and does not feel like she can unload her elbow.  Recommended compression stockings for the lymphedema in her legs.  She is given a prescription for knee-high compression stockings.  Follow-Up Instructions: Return if symptoms worsen or fail to improve.   Ortho Exam  Patient is alert, oriented, no adenopathy, well-dressed, normal affect, normal respiratory effort. Examination patient ambulates with a rolling walker with kyphosis.  Examination of  the right elbow there is no cellulitis no bursitis.  There is no skin breakdown there is some discoloration secondary to blunt trauma.  Examination of her right side she has a spinal cord stimulator with a wound that is 3 mm in diameter the metal of the spinal cord stimulator is visible in the wound and there is clear serous drainage from the exposed hardware.  Examination of her legs she has no open ulcers there is no drainage she does have lymphedema in her legs.  There is pitting edema up to the tibial tubercle.  There is no cellulitis in either leg.  Imaging: No results found. No images are attached to the encounter.  Labs: Lab Results  Component Value Date   ESRSEDRATE 36 (H) 07/04/2008   REPTSTATUS 05/23/2017 FINAL 05/20/2017   GRAMSTAIN  10/14/2009    RARE WBC PRESENT,BOTH PMN AND MONONUCLEAR RARE SQUAMOUS EPITHELIAL CELLS PRESENT NO ORGANISMS SEEN   CULT (A) 05/20/2017    MORAXELLA SPECIES BETA LACTAMASE NEGATIVE Performed at [facility], [address], [place], [place] [address]      Lab Results  Component Value Date   ALBUMIN 3.7 02/06/2014    Body mass index is 44.29 kg/m.  Orders:  No orders of the defined types were placed in this encounter.  No orders of the  defined types were placed in this encounter.    Procedures: No procedures performed  Clinical Data: No additional findings.  ROS:  All other systems negative, except as noted in the HPI. Review of Systems  Objective: Vital Signs: Ht 5\' 3"  (1.6 m)   Wt 250 lb (113.4 kg)   BMI 44.29 kg/m   Specialty Comments:  No specialty comments available.  PMFS History: Patient Active Problem List   Diagnosis Date Noted  . COPD (chronic obstructive pulmonary disease) (HCC) 05/21/2017  . History of DVT (deep vein thrombosis) 05/21/2017  . Cellulitis 05/20/2017  . Iron deficiency anemia 04/11/2017  . Degenerative spondylolisthesis 05/31/2016  . Dyspnea 11/21/2015  . Scoliosis (and  kyphoscoliosis), idiopathic 02/15/2014  . Lumbar stenosis with neurogenic claudication 02/15/2014  . Closed fracture of ankle 09/12/2013  . HNP (herniated nucleus pulposus), lumbar 11/06/2012  . Pulmonary nodule, right 10/07/2009  . Nonspecific (abnormal) findings on radiological and other examination of body structure 09/30/2009  . ABNORMAL CHEST XRAY 09/30/2009  . Allergic rhinitis 07/29/2009  . BIPOLAR AFFECTIVE DISORDER 07/28/2009  . Migraine headache 07/28/2009  . G E R D 07/28/2009  . FIBROMYALGIA 07/28/2009  . Vocal cord dysfunction 07/28/2009   Past Medical History:  Diagnosis Date  . Anxiety   . Asthma   . Bipolar affective disorder (HCC)   . COPD (chronic obstructive pulmonary disease) (HCC)   . Cough   . Depression   . DJD (degenerative joint disease) of lumbar spine   . DVT (deep venous thrombosis) (HCC) 2010   groin left - on coumadin for 6 months  . Family history of anesthesia complication    Hx: father has nausea  . Fibromyalgia   . GERD (gastroesophageal reflux disease)   . Hypertension    Hx: of in 2011  . Left leg DVT (HCC) 2010  . Lymphedema 2019  . Migraine   . Pneumonia   . PONV (postoperative nausea and vomiting)    and migraines  . Shortness of breath    exertion  . Spinal cord stimulator dysfunction (HCC)    has one in,but not working  . Spinal headache   . Vertigo    Hx: of  . Vocal cord dysfunction    sees dr. Delton Coombes    Family History  Problem Relation Age of Onset  . Lung cancer Mother   . Prostate cancer Father   . Heart disease Father   . Rheum arthritis Paternal Grandmother   . Asthma Sister     Past Surgical History:  Procedure Laterality Date  . ABDOMINAL HYSTERECTOMY  1980   partial   . ANTERIOR LAT LUMBAR FUSION Left 02/15/2014   Procedure: ANTERIOR LATERAL LUMBAR INTERBODY FUSION LUMBAR TWO-THREE,LUMBAR THREE-FOUR WITH LATERAL PLATE.;  Surgeon: Temple Pacini, MD;  Location: [facility];  Service: Neurosurgery;  Laterality:  Left;  left   . APPENDECTOMY    . BACK SURGERY    . BREAST ENHANCEMENT SURGERY  1989  . CARPAL TUNNEL RELEASE  2006   Right hand  . CATARACT EXTRACTION W/ INTRAOCULAR LENS  IMPLANT, BILATERAL     Hx: of  . CERVICAL FUSION  2005  . CHOLECYSTECTOMY  1989  . COLONOSCOPY W/ BIOPSIES AND POLYPECTOMY     Hx: of  . FINGER ARTHROSCOPY WITH CARPOMETACARPEL (CMC) ARTHROPLASTY     thumb  . FRACTURE SURGERY Right 09/2013   ankle and leg - plate and screws  . LUMBAR LAMINECTOMY/DECOMPRESSION MICRODISCECTOMY Right 11/06/2012   Procedure: LUMBAR TWO  THREE LUMBAR LAMINECTOMY/DECOMPRESSION MICRODISCECTOMY 1 LEVEL;  Surgeon: Temple Pacini, MD;  Location: [facility];  Service: Neurosurgery;  Laterality: Right;  . ORIF ANKLE FRACTURE Right 2015  . ORIF TIBIA FRACTURE Right 2015  . PARTIAL HYSTERECTOMY  1980  . ROTATOR CUFF REPAIR Right 2017  . SHOULDER ARTHROSCOPY    . SPINAL CORD STIMULATOR BATTERY EXCHANGE Right 09/09/2017   Procedure: SPINAL CORD STIMULATOR BATTERY EXCHANGE;  Surgeon: Odette Fraction, MD;  Location: [facility];  Service: Neurosurgery;  Laterality: Right;  SPINAL CORD STIMULATOR BATTERY EXCHANGE  . SPINAL CORD STIMULATOR IMPLANT  2010  . SPINAL FUSION  2018   L1, L2  . TARSAL SUSPENSION     Hx; of left thumb  . TONSILLECTOMY  1955  . UPPER GI ENDOSCOPY     Hx: of   Social History   Occupational History  . Not on file  Tobacco Use  . Smoking status: Former Smoker    Packs/day: 2.50    Years: 21.00    Pack years: 52.50    Types: Cigarettes    Last attempt to quit: 09/16/1986    Years since quitting: 31.2  . Smokeless tobacco: Never Used  Substance and Sexual Activity  . Alcohol use: No    Comment: recovering alcoholic sober since 1989  . Drug use: No  . Sexual activity: Not on file

## 2017-11-22 NOTE — Telephone Encounter (Signed)
Faxed note per request

## 2017-11-23 ENCOUNTER — Telehealth (INDEPENDENT_AMBULATORY_CARE_PROVIDER_SITE_OTHER): Payer: Self-pay | Admitting: Orthopedic Surgery

## 2017-11-23 NOTE — Telephone Encounter (Signed)
Returned call to patient left message to call back. 

## 2017-12-21 ENCOUNTER — Encounter (INDEPENDENT_AMBULATORY_CARE_PROVIDER_SITE_OTHER): Payer: Self-pay | Admitting: Orthopedic Surgery

## 2017-12-21 ENCOUNTER — Ambulatory Visit (INDEPENDENT_AMBULATORY_CARE_PROVIDER_SITE_OTHER): Payer: Medicare Other | Admitting: Physician Assistant

## 2017-12-21 VITALS — Ht 63.0 in | Wt 250.0 lb

## 2017-12-21 DIAGNOSIS — I890 Lymphedema, not elsewhere classified: Secondary | ICD-10-CM

## 2017-12-21 DIAGNOSIS — T847XXD Infection and inflammatory reaction due to other internal orthopedic prosthetic devices, implants and grafts, subsequent encounter: Secondary | ICD-10-CM | POA: Diagnosis not present

## 2017-12-21 MED ORDER — DOXYCYCLINE HYCLATE 100 MG PO CAPS: 100.0000 mg | ORAL_CAPSULE | Freq: Two times a day (BID) | ORAL | 1 refills | Status: DC

## 2017-12-23 ENCOUNTER — Encounter (INDEPENDENT_AMBULATORY_CARE_PROVIDER_SITE_OTHER): Payer: Self-pay | Admitting: Physician Assistant

## 2017-12-23 NOTE — Progress Notes (Signed)
Office Visit Note   Patient: [name]           Date of Birth: [birth year]           MRN: [id number] Visit Date: 12/21/2017              Requested by: Lenell Antu, DO [address] [place], [place] [address] PCP: Lenell Antu, DO  Chief Complaint  Patient presents with  . Neck - Pain      HPI: The patient is a 72 year old female who is seen for a residual right lateral trunk wound following removal and replacement of a spinal cord stimulator approximately 2 months ago.  She is been followed by neurosurgery following removal of her spinal cord stimulator and was advised to follow-up here as she has a small residual tract and she reports continued bloody drainage from the area.  She is also seen for bilateral lower extremity swelling.  She does have lymphedema pumps at home and wears them approximately 3 hours daily.  Currently she only has some small crusting and scabbing about her calves but out without overt open wounds or overt infection.  She reports she just finished a course of clindamycin x1 month.  Assessment & Plan: Visit Diagnoses:  1. Lymphedema of both lower extremities   2. Hardware complicating wound infection, subsequent encounter     Plan: We will begin a course of doxycycline 100 mg p.o. twice daily for the next 2 weeks as this also has some anti-inflammatory effect.  Also have recommended the patient wear Vive silver compression stockings to help control her bilateral lower extremity edema.  Continue dry dressing to the right truncal draining tract and follow-up with neurosurgery concerning this.  She will follow-up here in 3 weeks.  Follow-Up Instructions: Return in about 3 weeks (around 01/11/2018).   Ortho Exam  Patient is alert, oriented, no adenopathy, well-dressed, normal affect, normal respiratory effort. The patient has a right back/lateral truncal track which is open and has moderate serosanguineous appearing drainage.  The tract was not probed.  There is no  periwound irritation or signs of cellulitis.  Bilateral lower extremities have massive edema.  She has multiple areas of scabbing and crusting but no overt open draining wounds.  Bilateral calf has circumference is 47 cm.  She has palpable pedal pulses.  There is no lower extremity cellulitis or other signs of infection.  Imaging: No results found. No images are attached to the encounter.  Labs: Lab Results  Component Value Date   ESRSEDRATE 36 (H) 07/04/2008   REPTSTATUS 05/23/2017 FINAL 05/20/2017   GRAMSTAIN  10/14/2009    RARE WBC PRESENT,BOTH PMN AND MONONUCLEAR RARE SQUAMOUS EPITHELIAL CELLS PRESENT NO ORGANISMS SEEN   CULT (A) 05/20/2017    MORAXELLA SPECIES BETA LACTAMASE NEGATIVE Performed at [facility], [address] [place], [place] [address]      Lab Results  Component Value Date   ALBUMIN 3.7 02/06/2014    Body mass index is 44.29 kg/m.  Orders:  No orders of the defined types were placed in this encounter.  Meds ordered this encounter  Medications  . doxycycline (VIBRAMYCIN) 100 MG capsule    Sig: Take 1 capsule (100 mg total) by mouth 2 (two) times daily.    Dispense:  28 capsule    Refill:  1     Procedures: No procedures performed  Clinical Data: No additional findings.  ROS:  All other systems negative, except  as noted in the HPI. Review of Systems  Objective: Vital Signs: Ht 5\' 3"  (1.6 m)   Wt 250 lb (113.4 kg)   BMI 44.29 kg/m   Specialty Comments:  No specialty comments available.  PMFS History: Patient Active Problem List   Diagnosis Date Noted  . COPD (chronic obstructive pulmonary disease) (HCC) 05/21/2017  . History of DVT (deep vein thrombosis) 05/21/2017  . Cellulitis 05/20/2017  . Iron deficiency anemia 04/11/2017  . Degenerative spondylolisthesis 05/31/2016  . Dyspnea 11/21/2015  . Scoliosis (and kyphoscoliosis), idiopathic 02/15/2014  . Lumbar stenosis with neurogenic claudication 02/15/2014  . Closed  fracture of ankle 09/12/2013  . HNP (herniated nucleus pulposus), lumbar 11/06/2012  . Pulmonary nodule, right 10/07/2009  . Nonspecific (abnormal) findings on radiological and other examination of body structure 09/30/2009  . ABNORMAL CHEST XRAY 09/30/2009  . Allergic rhinitis 07/29/2009  . BIPOLAR AFFECTIVE DISORDER 07/28/2009  . Migraine headache 07/28/2009  . G E R D 07/28/2009  . FIBROMYALGIA 07/28/2009  . Vocal cord dysfunction 07/28/2009   Past Medical History:  Diagnosis Date  . Anxiety   . Asthma   . Bipolar affective disorder (HCC)   . COPD (chronic obstructive pulmonary disease) (HCC)   . Cough   . Depression   . DJD (degenerative joint disease) of lumbar spine   . DVT (deep venous thrombosis) (HCC) 2010   groin left - on coumadin for 6 months  . Family history of anesthesia complication    Hx: father has nausea  . Fibromyalgia   . GERD (gastroesophageal reflux disease)   . Hypertension    Hx: of in 2011  . Left leg DVT (HCC) 2010  . Lymphedema 2019  . Migraine   . Pneumonia   . PONV (postoperative nausea and vomiting)    and migraines  . Shortness of breath    exertion  . Spinal cord stimulator dysfunction (HCC)    has one in,but not working  . Spinal headache   . Vertigo    Hx: of  . Vocal cord dysfunction    sees dr. Delton Coombesbyrum    Family History  Problem Relation Age of Onset  . Lung cancer Mother   . Prostate cancer Father   . Heart disease Father   . Rheum arthritis Paternal Grandmother   . Asthma Sister     Past Surgical History:  Procedure Laterality Date  . ABDOMINAL HYSTERECTOMY  1980   partial   . ANTERIOR LAT LUMBAR FUSION Left 02/15/2014   Procedure: ANTERIOR LATERAL LUMBAR INTERBODY FUSION LUMBAR TWO-THREE,LUMBAR THREE-FOUR WITH LATERAL PLATE.;  Surgeon: Temple PaciniHenry A Pool, MD;  Location: [facility];  Service: Neurosurgery;  Laterality: Left;  left   . APPENDECTOMY    . BACK SURGERY    . BREAST ENHANCEMENT SURGERY  1989  . CARPAL TUNNEL  RELEASE  2006   Right hand  . CATARACT EXTRACTION W/ INTRAOCULAR LENS  IMPLANT, BILATERAL     Hx: of  . CERVICAL FUSION  2005  . CHOLECYSTECTOMY  1989  . COLONOSCOPY W/ BIOPSIES AND POLYPECTOMY     Hx: of  . FINGER ARTHROSCOPY WITH CARPOMETACARPEL (CMC) ARTHROPLASTY     thumb  . FRACTURE SURGERY Right 09/2013   ankle and leg - plate and screws  . LUMBAR LAMINECTOMY/DECOMPRESSION MICRODISCECTOMY Right 11/06/2012   Procedure: LUMBAR TWO THREE LUMBAR LAMINECTOMY/DECOMPRESSION MICRODISCECTOMY 1 LEVEL;  Surgeon: Temple PaciniHenry A Pool, MD;  Location: [facility];  Service: Neurosurgery;  Laterality: Right;  . ORIF ANKLE FRACTURE  Right 2015  . ORIF TIBIA FRACTURE Right 2015  . PARTIAL HYSTERECTOMY  1980  . ROTATOR CUFF REPAIR Right 2017  . SHOULDER ARTHROSCOPY    . SPINAL CORD STIMULATOR BATTERY EXCHANGE Right 09/09/2017   Procedure: SPINAL CORD STIMULATOR BATTERY EXCHANGE;  Surgeon: Odette Fraction, MD;  Location: [facility];  Service: Neurosurgery;  Laterality: Right;  SPINAL CORD STIMULATOR BATTERY EXCHANGE  . SPINAL CORD STIMULATOR IMPLANT  2010  . SPINAL FUSION  2018   L1, L2  . TARSAL SUSPENSION     Hx; of left thumb  . TONSILLECTOMY  1955  . UPPER GI ENDOSCOPY     Hx: of   Social History   Occupational History  . Not on file  Tobacco Use  . Smoking status: Former Smoker    Packs/day: 2.50    Years: 21.00    Pack years: 52.50    Types: Cigarettes    Last attempt to quit: 09/16/1986    Years since quitting: 31.2  . Smokeless tobacco: Never Used  Substance and Sexual Activity  . Alcohol use: No    Comment: recovering alcoholic sober since 1989  . Drug use: No  . Sexual activity: Not on file

## 2017-12-28 ENCOUNTER — Emergency Department (HOSPITAL_BASED_OUTPATIENT_CLINIC_OR_DEPARTMENT_OTHER)
Admission: EM | Admit: 2017-12-28 | Discharge: 2017-12-28 | Disposition: A | Discharge status: Home / Self Care | Payer: Medicare Other | Attending: Emergency Medicine | Admitting: Emergency Medicine

## 2017-12-28 ENCOUNTER — Other Ambulatory Visit: Payer: Self-pay

## 2017-12-28 DIAGNOSIS — Z87891 Personal history of nicotine dependence: Secondary | ICD-10-CM | POA: Insufficient documentation

## 2017-12-28 DIAGNOSIS — Z79899 Other long term (current) drug therapy: Secondary | ICD-10-CM | POA: Diagnosis not present

## 2017-12-28 DIAGNOSIS — R21 Rash and other nonspecific skin eruption: Secondary | ICD-10-CM | POA: Diagnosis present

## 2017-12-28 DIAGNOSIS — Y69 Unspecified misadventure during surgical and medical care: Secondary | ICD-10-CM | POA: Diagnosis not present

## 2017-12-28 DIAGNOSIS — I10 Essential (primary) hypertension: Secondary | ICD-10-CM | POA: Insufficient documentation

## 2017-12-28 DIAGNOSIS — T8149XA Infection following a procedure, other surgical site, initial encounter: Secondary | ICD-10-CM | POA: Diagnosis not present

## 2017-12-28 DIAGNOSIS — J449 Chronic obstructive pulmonary disease, unspecified: Secondary | ICD-10-CM | POA: Diagnosis not present

## 2017-12-28 DIAGNOSIS — B029 Zoster without complications: Secondary | ICD-10-CM | POA: Insufficient documentation

## 2017-12-28 MED ORDER — VALACYCLOVIR HCL 1 G PO TABS: 1000.0000 mg | ORAL_TABLET | Freq: Three times a day (TID) | ORAL | 0 refills | Status: DC

## 2017-12-28 NOTE — ED Triage Notes (Signed)
Pt had new battery installed in stimulator approx. 2 months ago and a "hole" developed at the entrance site, which is on right flank.and the new battery was removed because it was source of infection. Pt also states that she has an abscess in the crease of her right leg that developed 23 days ago. A third site in left leg crease developed last night..Marland Kitchen

## 2017-12-28 NOTE — ED Provider Notes (Signed)
[facility] EMERGENCY DEPARTMENT Provider Note   CSN: [id number] Arrival date & time: 12/28/17  1341     History   Chief Complaint Chief Complaint  Patient presents with  . Abscess    HPI [name] is a 72 y.o. female.  72 yo F with a chief complaints of a chronic wound to her right side.  The patient unfortunately had a nerve stimulator that ended up having a fistula to the skin and was inevitably removed.  She has been seen by orthopedic office.  She is here to have someone take a look at it.  She also has a new rash to her right inguinal region.  She said it formed somewhat like a pimple and she squeezed it and pus came out of it.  She thinks she had one on the other side as well but is unable to equalize it.  The history is provided by the patient.  Abscess  Associated symptoms: no fever, no headaches, no nausea and no vomiting   Illness  This is a new problem. The current episode started 2 days ago. The problem occurs constantly. The problem has not changed since onset.Pertinent negatives include no chest pain, no headaches and no shortness of breath. Nothing aggravates the symptoms. Nothing relieves the symptoms. She has tried nothing for the symptoms. The treatment provided no relief.    Past Medical History:  Diagnosis Date  . Anxiety   . Asthma   . Bipolar affective disorder (HCC)   . COPD (chronic obstructive pulmonary disease) (HCC)   . Cough   . Depression   . DJD (degenerative joint disease) of lumbar spine   . DVT (deep venous thrombosis) (HCC) 2010   groin left - on coumadin for 6 months  . Family history of anesthesia complication    Hx: father has nausea  . Fibromyalgia   . GERD (gastroesophageal reflux disease)   . Hypertension    Hx: of in 2011  . Left leg DVT (HCC) 2010  . Lymphedema 2019  . Migraine   . Pneumonia   . PONV (postoperative nausea and vomiting)    and migraines  . Shortness of breath    exertion  . Spinal cord  stimulator dysfunction (HCC)    has one in,but not working  . Spinal headache   . Vertigo    Hx: of  . Vocal cord dysfunction    sees dr. Delton Coombes    Patient Active Problem List   Diagnosis Date Noted  . COPD (chronic obstructive pulmonary disease) (HCC) 05/21/2017  . History of DVT (deep vein thrombosis) 05/21/2017  . Cellulitis 05/20/2017  . Iron deficiency anemia 04/11/2017  . Degenerative spondylolisthesis 05/31/2016  . Dyspnea 11/21/2015  . Scoliosis (and kyphoscoliosis), idiopathic 02/15/2014  . Lumbar stenosis with neurogenic claudication 02/15/2014  . Closed fracture of ankle 09/12/2013  . HNP (herniated nucleus pulposus), lumbar 11/06/2012  . Pulmonary nodule, right 10/07/2009  . Nonspecific (abnormal) findings on radiological and other examination of body structure 09/30/2009  . ABNORMAL CHEST XRAY 09/30/2009  . Allergic rhinitis 07/29/2009  . BIPOLAR AFFECTIVE DISORDER 07/28/2009  . Migraine headache 07/28/2009  . G E R D 07/28/2009  . FIBROMYALGIA 07/28/2009  . Vocal cord dysfunction 07/28/2009    Past Surgical History:  Procedure Laterality Date  . ABDOMINAL HYSTERECTOMY  1980   partial   . ANTERIOR LAT LUMBAR FUSION Left 02/15/2014   Procedure: ANTERIOR LATERAL LUMBAR INTERBODY FUSION LUMBAR TWO-THREE,LUMBAR THREE-FOUR WITH LATERAL PLATE.;  Surgeon:  Temple PaciniHenry A Pool, MD;  Location: [facility];  Service: Neurosurgery;  Laterality: Left;  left   . APPENDECTOMY    . BACK SURGERY    . BREAST ENHANCEMENT SURGERY  1989  . CARPAL TUNNEL RELEASE  2006   Right hand  . CATARACT EXTRACTION W/ INTRAOCULAR LENS  IMPLANT, BILATERAL     Hx: of  . CERVICAL FUSION  2005  . CHOLECYSTECTOMY  1989  . COLONOSCOPY W/ BIOPSIES AND POLYPECTOMY     Hx: of  . FINGER ARTHROSCOPY WITH CARPOMETACARPEL (CMC) ARTHROPLASTY     thumb  . FRACTURE SURGERY Right 09/2013   ankle and leg - plate and screws  . LUMBAR LAMINECTOMY/DECOMPRESSION MICRODISCECTOMY Right 11/06/2012   Procedure: LUMBAR  TWO THREE LUMBAR LAMINECTOMY/DECOMPRESSION MICRODISCECTOMY 1 LEVEL;  Surgeon: Temple PaciniHenry A Pool, MD;  Location: [facility];  Service: Neurosurgery;  Laterality: Right;  . ORIF ANKLE FRACTURE Right 2015  . ORIF TIBIA FRACTURE Right 2015  . PARTIAL HYSTERECTOMY  1980  . ROTATOR CUFF REPAIR Right 2017  . SHOULDER ARTHROSCOPY    . SPINAL CORD STIMULATOR BATTERY EXCHANGE Right 09/09/2017   Procedure: SPINAL CORD STIMULATOR BATTERY EXCHANGE;  Surgeon: Odette FractionHarkins, Paul, MD;  [facility];  Service: Neurosurgery;  Laterality: Right;  SPINAL CORD STIMULATOR BATTERY EXCHANGE  . SPINAL CORD STIMULATOR IMPLANT  2010  . SPINAL FUSION  2018   L1, L2  . TARSAL SUSPENSION     Hx; of left thumb  . TONSILLECTOMY  1955  . UPPER GI ENDOSCOPY     Hx: of     OB History   None      Home Medications    Prior to Admission medications   Medication Sig Start Date End Date Taking? Authorizing Provider  albuterol (PROVENTIL HFA;VENTOLIN HFA) 108 (90 BASE) MCG/ACT inhaler Inhale 2 puffs into the lungs every 6 (six) hours as needed for wheezing.    [provider]  ALPRAZolam Prudy Feeler(XANAX) 1 MG tablet Take 2 mg by mouth at bedtime. May take a dose of 1 mg during the day if needed for anxiety    [provider]  AMITIZA 24 MCG capsule  05/21/17   [provider]  ARIPiprazole (ABILIFY) 30 MG tablet Take 30 mg by mouth at bedtime. 05/04/16   [provider]  Ascorbic Acid (VITAMIN C GUMMIE PO) Take 1 tablet by mouth daily after breakfast.     [provider]  butalbital-acetaminophen-caffeine (FIORICET, ESGIC) 50-325-40 MG per tablet Take 2 tablets by mouth every 4 (four) hours as needed for headache or migraine.     [provider]  calcitonin, salmon, (MIACALCIN/FORTICAL) 200 UNIT/ACT nasal spray Place 1 spray into alternate nostrils daily after breakfast.     [provider]  calcium carbonate (OSCAL) 1500 (600 Ca) MG TABS tablet Take 600 mg of elemental calcium  by mouth daily with breakfast.    [provider]  Cholecalciferol (VITAMIN D) 2000 units CAPS Take 2,000 Units by mouth daily after breakfast.    [provider]  cyclobenzaprine (FLEXERIL) 10 MG tablet Take 20 mg by mouth at bedtime.     [provider]  cycloSPORINE (RESTASIS) 0.05 % ophthalmic emulsion Place 1 drop into both eyes 2 (two) times daily.    [provider]  docusate sodium (COLACE) 100 MG capsule Take 200 mg by mouth 2 (two) times daily.    [provider]  doxycycline (VIBRAMYCIN) 100 MG capsule Take 1 capsule (100 mg total) by mouth  2 (two) times daily. 12/21/17   Rayburn, Fanny Bien, PA-C  esomeprazole (NEXIUM) 40 MG capsule Take 40 mg by mouth daily before breakfast.      [provider]  ezetimibe (ZETIA) 10 MG tablet Take 10 mg by mouth at bedtime.    [provider]  fluticasone (FLONASE) 50 MCG/ACT nasal spray Place 2 sprays into both nostrils 2 (two) times daily. 12/05/13   Leslye Peer, MD  furosemide (LASIX) 40 MG tablet Take 40 mg by mouth 4 (four) times daily.     [provider]  hydrOXYzine (ATARAX/VISTARIL) 25 MG tablet Take 25-50 mg by mouth 2 (two) times daily. 25 mg every morning, 50 mg every night    [provider]  lactated ringers infusion Inject 100 mLs into the vein continuous. 09/09/17   Odette Fraction, MD  lamoTRIgine (LAMICTAL) 150 MG tablet Take 300 mg by mouth at bedtime.    [provider]  lisdexamfetamine (VYVANSE) 70 MG capsule Take 70 mg by mouth daily after breakfast.     [provider]  Loratadine 10 MG CAPS Take 10 mg by mouth at bedtime.     [provider]  meclizine (ANTIVERT) 25 MG tablet Take 25 mg by mouth 3 (three) times daily as needed for dizziness.     [provider]  Melatonin 300 MCG TABS Take 300 mcg by mouth at bedtime.     [provider]  nitroGLYCERIN (NITROSTAT) 0.4 MG SL tablet Place 0.4 mg  under the tongue every 5 (five) minutes as needed for chest pain.    [provider]  omeprazole (PRILOSEC) 20 MG capsule Take 20 mg by mouth at bedtime.      [provider]  Polyethyl Glycol-Propyl Glycol (SYSTANE OP) Place 1 drop into both eyes at bedtime.    [provider]  Potassium Gluconate 595 MG CAPS Take 1 capsule by mouth 2 (two) times daily.    [provider]  pramipexole (MIRAPEX) 0.5 MG tablet Take 0.5 mg by mouth at bedtime.      [provider]  pregabalin (LYRICA) 100 MG capsule Take 100 mg by mouth daily after breakfast.    [provider]  pregabalin (LYRICA) 150 MG capsule Take 300 mg by mouth at bedtime.    [provider]  spironolactone (ALDACTONE) 50 MG tablet Take 50 mg by mouth at bedtime.     [provider]  terbinafine (LAMISIL) 250 MG tablet Take 250 mg by mouth daily after breakfast.    [provider]  Tiotropium Bromide-Olodaterol (STIOLTO RESPIMAT) 2.5-2.5 MCG/ACT AERS Inhale 2 puffs into the lungs daily. 08/31/17   Leslye Peer, MD  Tiotropium Bromide-Olodaterol (STIOLTO RESPIMAT) 2.5-2.5 MCG/ACT AERS Inhale 2 puffs into the lungs daily. 08/31/17   Leslye Peer, MD  triamcinolone (KENALOG) 0.025 % cream Apply 1 application topically 2 (two) times daily as needed (itching).    [provider]  valACYclovir (VALTREX) 1000 MG tablet Take 1 tablet (1,000 mg total) by mouth 3 (three) times daily. 12/28/17   Melene Plan, DO  vitamin B-12 (CYANOCOBALAMIN) 1000 MCG tablet Take 1,000 mcg by mouth daily after breakfast.    [provider]    Family History Family History  Problem Relation Age of Onset  . Lung cancer Mother   . Prostate cancer Father   . Heart disease Father   . Rheum arthritis Paternal Grandmother   . Asthma Sister     Social History Social  History   Tobacco Use  . Smoking status: Former Smoker    Packs/day: 2.50    Years: 21.00    Pack  years: 52.50    Types: Cigarettes    Last attempt to quit: 09/16/1986    Years since quitting: 31.3  . Smokeless tobacco: Never Used  Substance Use Topics  . Alcohol use: No    Comment: recovering alcoholic sober since 1989  . Drug use: No     Allergies   Lisinopril; Lithium; Statins; Adhesive [tape]; Codeine; and Scopolamine   Review of Systems Review of Systems  Constitutional: Negative for chills and fever.  HENT: Negative for congestion and rhinorrhea.   Eyes: Negative for redness and visual disturbance.  Respiratory: Negative for shortness of breath and wheezing.   Cardiovascular: Negative for chest pain and palpitations.  Gastrointestinal: Negative for nausea and vomiting.  Genitourinary: Negative for dysuria and urgency.  Musculoskeletal: Negative for arthralgias and myalgias.  Skin: Positive for color change and wound. Negative for pallor.  Neurological: Negative for dizziness and headaches.     Physical Exam Updated Vital Signs BP (!) 164/73 (BP Location: Right Arm)   Pulse 82   Temp 98.4 F (36.9 C) (Oral)   Resp 18   Ht 5\' 3"  (1.6 m)   Wt 108.9 kg   SpO2 94%   BMI 42.51 kg/m   Physical Exam  Constitutional: She is oriented to person, place, and time. She appears well-developed and well-nourished. No distress.  HENT:  Head: Normocephalic and atraumatic.  Eyes: Pupils are equal, round, and reactive to light. EOM are normal.  Neck: Normal range of motion. Neck supple.  Cardiovascular: Normal rate and regular rhythm. Exam reveals no gallop and no friction rub.  No murmur heard. Pulmonary/Chest: Effort normal. She has no wheezes. She has no rales.  Abdominal: Soft. She exhibits no distension and no mass. There is no tenderness. There is no guarding.  Genitourinary:     Musculoskeletal: She exhibits no edema or tenderness.       Back:  Neurological: She is alert and oriented to person, place, and time.  Skin: Skin is warm and dry. She is not  diaphoretic.  Psychiatric: She has a normal mood and affect. Her behavior is normal.  Nursing note and vitals reviewed.    ED Treatments / Results  Labs (all labs ordered are listed, but only abnormal results are displayed) Labs Reviewed - No data to display  EKG None  Radiology No results found.  Procedures Procedures (including critical care time)  Medications Ordered in ED Medications - No data to display   Initial Impression / Assessment and Plan / ED Course  I have reviewed the triage vital signs and the nursing notes.  Pertinent labs & imaging results that were available during my care of the patient were reviewed by me and considered in my medical decision making (see chart for details).     72 yo F with a chief complaint of a chronic wound to her right side.  My exam of this without acute infection there is no purulent drainage no erythema nontender.  The patient is a new linear rash to her right inguinal region.  I suspect that this is shingles based on its appearance and have perfect line, it looks like she has to eroded ulcerations.  I suspect that it could be contact dermatitis though she denies any localized new product that stuck just on one side of her groin.  We will have her  follow-up with Dr. Lajoyce Corners in the office.  Return precautions given.  2:17 PM:  I have discussed the diagnosis/risks/treatment options with the patient and believe the pt to be eligible for discharge home to follow-up with Ortho, PCP. We also discussed returning to the ED immediately if new or worsening sx occur. We discussed the sx which are most concerning (e.g., sudden worsening pain, fever, inability to tolerate by mouth) that necessitate immediate return. Medications administered to the patient during their visit and any new prescriptions provided to the patient are listed below.  Medications given during this visit Medications - No data to display    The patient appears reasonably  screen and/or stabilized for discharge and I doubt any other medical condition or other [facility] requiring further screening, evaluation, or treatment in the ED at this time prior to discharge.    Final Clinical Impressions(s) / ED Diagnoses   Final diagnoses:  Infected surgical wound  Herpes zoster without complication    ED Discharge Orders         Ordered    valACYclovir (VALTREX) 1000 MG tablet  3 times daily     12/28/17 1412           Melene Plan, DO 12/28/17 1417

## 2017-12-28 NOTE — ED Notes (Signed)
ED Provider at bedside. 

## 2018-01-19 ENCOUNTER — Ambulatory Visit (INDEPENDENT_AMBULATORY_CARE_PROVIDER_SITE_OTHER): Payer: Medicare Other | Admitting: Orthopedic Surgery

## 2018-03-06 ENCOUNTER — Telehealth: Payer: Self-pay | Admitting: Internal Medicine

## 2018-03-06 ENCOUNTER — Encounter: Payer: Self-pay | Admitting: Internal Medicine

## 2018-03-06 ENCOUNTER — Inpatient Hospital Stay (HOSPITAL_BASED_OUTPATIENT_CLINIC_OR_DEPARTMENT_OTHER): Payer: Medicare Other | Admitting: Internal Medicine

## 2018-03-06 ENCOUNTER — Inpatient Hospital Stay: Payer: Medicare Other | Attending: Internal Medicine

## 2018-03-06 VITALS — BP 137/79 | HR 88 | Temp 98.6°F | Resp 20 | Ht 63.0 in | Wt 242.5 lb

## 2018-03-06 DIAGNOSIS — D509 Iron deficiency anemia, unspecified: Secondary | ICD-10-CM

## 2018-03-06 DIAGNOSIS — Z79899 Other long term (current) drug therapy: Secondary | ICD-10-CM

## 2018-03-06 DIAGNOSIS — D508 Other iron deficiency anemias: Secondary | ICD-10-CM

## 2018-03-06 DIAGNOSIS — I10 Essential (primary) hypertension: Secondary | ICD-10-CM | POA: Insufficient documentation

## 2018-03-06 LAB — CBC WITH DIFFERENTIAL (CANCER CENTER ONLY)
ABS IMMATURE GRANULOCYTES: 0.06 10*3/uL (ref 0.00–0.07)
BASOS ABS: 0 10*3/uL (ref 0.0–0.1)
BASOS PCT: 0 %
Eosinophils Absolute: 0.2 10*3/uL (ref 0.0–0.5)
Eosinophils Relative: 2 %
HEMATOCRIT: 41.2 % (ref 36.0–46.0)
Hemoglobin: 13.2 g/dL (ref 12.0–15.0)
IMMATURE GRANULOCYTES: 1 %
LYMPHS ABS: 1.7 10*3/uL (ref 0.7–4.0)
Lymphocytes Relative: 21 %
MCH: 28.9 pg (ref 26.0–34.0)
MCHC: 32 g/dL (ref 30.0–36.0)
MCV: 90.2 fL (ref 80.0–100.0)
MONOS PCT: 7 %
Monocytes Absolute: 0.6 10*3/uL (ref 0.1–1.0)
NEUTROS ABS: 5.6 10*3/uL (ref 1.7–7.7)
NEUTROS PCT: 69 %
PLATELETS: 316 10*3/uL (ref 150–400)
RBC: 4.57 MIL/uL (ref 3.87–5.11)
RDW: 14.1 % (ref 11.5–15.5)
WBC Count: 8.2 10*3/uL (ref 4.0–10.5)
nRBC: 0 % (ref 0.0–0.2)

## 2018-03-06 LAB — IRON AND TIBC
Iron: 55 ug/dL (ref 41–142)
SATURATION RATIOS: 17 % — AB (ref 21–57)
TIBC: 325 ug/dL (ref 236–444)
UIBC: 270 ug/dL (ref 120–384)

## 2018-03-06 LAB — FERRITIN: Ferritin: 61 ng/mL (ref 11–307)

## 2018-03-06 NOTE — Telephone Encounter (Signed)
Scheduled appt per 01/27 los. ° °Printed calendar and avs. °

## 2018-03-06 NOTE — Progress Notes (Signed)
[facility] Telephone:([phone]) [phone]   [phone]  OFFICE PROGRESS NOTE  Lenell AntuLe, Thao P, DO [address] [place] [id number]  DIAGNOSIS: Persistent microcytic anemia secondary to iron deficiency of unclear etiology probably secondary to poor intake.  PRIOR THERAPY: Feraheme infusion 510 mg IV weekly for 2 doses.  First dose April 15, 2017.  CURRENT THERAPY: Observation.  INTERVAL HISTORY: [name] 73 y.o. female returns to the clinic today for 6 months follow-up visit.  The patient is feeling fine today with no concerning complaints except for mild fatigue.  She had a recent infection treated with a course of antibiotics.  The patient was also recently diagnosed with lymphedema.  She denied having any chest pain, shortness of breath except with exertion with no cough or hemoptysis.  She has no nausea, vomiting, diarrhea or constipation.  She denied having any headache or visual changes.  She is here today for evaluation with repeat CBC, iron study and ferritin.  MEDICAL HISTORY: Past Medical History:  Diagnosis Date  . Anxiety   . Asthma   . Bipolar affective disorder (HCC)   . COPD (chronic obstructive pulmonary disease) (HCC)   . Cough   . Depression   . DJD (degenerative joint disease) of lumbar spine   . DVT (deep venous thrombosis) (HCC) 2010   groin left - on coumadin for 6 months  . Family history of anesthesia complication    Hx: father has nausea  . Fibromyalgia   . GERD (gastroesophageal reflux disease)   . Hypertension    Hx: of in 2011  . Left leg DVT (HCC) 2010  . Lymphedema 2019  . Migraine   . Pneumonia   . PONV (postoperative nausea and vomiting)    and migraines  . Shortness of breath    exertion  . Spinal cord stimulator dysfunction (HCC)    has one in,but not working  . Spinal headache   . Vertigo    Hx: of  . Vocal cord dysfunction    sees dr. Delton Coombesbyrum    ALLERGIES:  is allergic to lisinopril; lithium; statins;  adhesive [tape]; codeine; and scopolamine.  MEDICATIONS:  Current Outpatient Medications  Medication Sig Dispense Refill  . albuterol (PROVENTIL HFA;VENTOLIN HFA) 108 (90 BASE) MCG/ACT inhaler Inhale 2 puffs into the lungs every 6 (six) hours as needed for wheezing.    Marland Kitchen. ALPRAZolam (XANAX) 1 MG tablet Take 2 mg by mouth at bedtime. May take a dose of 1 mg during the day if needed for anxiety    . AMITIZA 24 MCG capsule     . ARIPiprazole (ABILIFY) 30 MG tablet Take 30 mg by mouth at bedtime.  5  . Ascorbic Acid (VITAMIN C GUMMIE PO) Take 1 tablet by mouth daily after breakfast.     . butalbital-acetaminophen-caffeine (FIORICET, ESGIC) 50-325-40 MG per tablet Take 2 tablets by mouth every 4 (four) hours as needed for headache or migraine.     . calcitonin, salmon, (MIACALCIN/FORTICAL) 200 UNIT/ACT nasal spray Place 1 spray into alternate nostrils daily after breakfast.     . calcium carbonate (OSCAL) 1500 (600 Ca) MG TABS tablet Take 600 mg of elemental calcium by mouth daily with breakfast.    . Cholecalciferol (VITAMIN D) 2000 units CAPS Take 2,000 Units by mouth daily after breakfast.    . cyclobenzaprine (FLEXERIL) 10 MG tablet Take 20 mg by mouth at bedtime.     . cycloSPORINE (RESTASIS) 0.05 %  ophthalmic emulsion Place 1 drop into both eyes 2 (two) times daily.    Marland Kitchen docusate sodium (COLACE) 100 MG capsule Take 200 mg by mouth 2 (two) times daily.    Marland Kitchen doxycycline (VIBRAMYCIN) 100 MG capsule Take 1 capsule (100 mg total) by mouth 2 (two) times daily. 28 capsule 1  . esomeprazole (NEXIUM) 40 MG capsule Take 40 mg by mouth daily before breakfast.      . ezetimibe (ZETIA) 10 MG tablet Take 10 mg by mouth at bedtime.    . fluticasone (FLONASE) 50 MCG/ACT nasal spray Place 2 sprays into both nostrils 2 (two) times daily. 16 g 11  . furosemide (LASIX) 40 MG tablet Take 40 mg by mouth 4 (four) times daily.     . hydrOXYzine (ATARAX/VISTARIL) 25 MG tablet Take 25-50 mg by mouth 2 (two) times daily.  25 mg every morning, 50 mg every night    . lactated ringers infusion Inject 100 mLs into the vein continuous. 250 mL 0  . lamoTRIgine (LAMICTAL) 150 MG tablet Take 300 mg by mouth at bedtime.    Marland Kitchen lisdexamfetamine (VYVANSE) 70 MG capsule Take 70 mg by mouth daily after breakfast.     . Loratadine 10 MG CAPS Take 10 mg by mouth at bedtime.     . meclizine (ANTIVERT) 25 MG tablet Take 25 mg by mouth 3 (three) times daily as needed for dizziness.     . Melatonin 300 MCG TABS Take 300 mcg by mouth at bedtime.     . nitroGLYCERIN (NITROSTAT) 0.4 MG SL tablet Place 0.4 mg under the tongue every 5 (five) minutes as needed for chest pain.    Marland Kitchen omeprazole (PRILOSEC) 20 MG capsule Take 20 mg by mouth at bedtime.      Bertram Gala Glycol-Propyl Glycol (SYSTANE OP) Place 1 drop into both eyes at bedtime.    . Potassium Gluconate 595 MG CAPS Take 1 capsule by mouth 2 (two) times daily.    . pramipexole (MIRAPEX) 0.5 MG tablet Take 0.5 mg by mouth at bedtime.      . pregabalin (LYRICA) 100 MG capsule Take 100 mg by mouth daily after breakfast.    . pregabalin (LYRICA) 150 MG capsule Take 300 mg by mouth at bedtime.    Marland Kitchen spironolactone (ALDACTONE) 50 MG tablet Take 50 mg by mouth at bedtime.     . terbinafine (LAMISIL) 250 MG tablet Take 250 mg by mouth daily after breakfast.    . Tiotropium Bromide-Olodaterol (STIOLTO RESPIMAT) 2.5-2.5 MCG/ACT AERS Inhale 2 puffs into the lungs daily. 1 Inhaler 5  . Tiotropium Bromide-Olodaterol (STIOLTO RESPIMAT) 2.5-2.5 MCG/ACT AERS Inhale 2 puffs into the lungs daily. 1 Inhaler 5  . triamcinolone (KENALOG) 0.025 % cream Apply 1 application topically 2 (two) times daily as needed (itching).    . valACYclovir (VALTREX) 1000 MG tablet Take 1 tablet (1,000 mg total) by mouth 3 (three) times daily. 21 tablet 0  . vitamin B-12 (CYANOCOBALAMIN) 1000 MCG tablet Take 1,000 mcg by mouth daily after breakfast.     No current facility-administered medications for this visit.      SURGICAL HISTORY:  Past Surgical History:  Procedure Laterality Date  . ABDOMINAL HYSTERECTOMY  1980   partial   . ANTERIOR LAT LUMBAR FUSION Left 02/15/2014   Procedure: ANTERIOR LATERAL LUMBAR INTERBODY FUSION LUMBAR TWO-THREE,LUMBAR THREE-FOUR WITH LATERAL PLATE.;  Surgeon: Temple Pacini, MD;  Location: [facility];  Service: Neurosurgery;  Laterality: Left;  left   .  APPENDECTOMY    . BACK SURGERY    . BREAST ENHANCEMENT SURGERY  1989  . CARPAL TUNNEL RELEASE  2006   Right hand  . CATARACT EXTRACTION W/ INTRAOCULAR LENS  IMPLANT, BILATERAL     Hx: of  . CERVICAL FUSION  2005  . CHOLECYSTECTOMY  1989  . COLONOSCOPY W/ BIOPSIES AND POLYPECTOMY     Hx: of  . FINGER ARTHROSCOPY WITH CARPOMETACARPEL (CMC) ARTHROPLASTY     thumb  . FRACTURE SURGERY Right 09/2013   ankle and leg - plate and screws  . LUMBAR LAMINECTOMY/DECOMPRESSION MICRODISCECTOMY Right 11/06/2012   Procedure: LUMBAR TWO THREE LUMBAR LAMINECTOMY/DECOMPRESSION MICRODISCECTOMY 1 LEVEL;  Surgeon: Temple Pacini, MD;  Location: [facility];  Service: Neurosurgery;  Laterality: Right;  . ORIF ANKLE FRACTURE Right 2015  . ORIF TIBIA FRACTURE Right 2015  . PARTIAL HYSTERECTOMY  1980  . ROTATOR CUFF REPAIR Right 2017  . SHOULDER ARTHROSCOPY    . SPINAL CORD STIMULATOR BATTERY EXCHANGE Right 09/09/2017   Procedure: SPINAL CORD STIMULATOR BATTERY EXCHANGE;  Surgeon: Odette Fraction, MD;  Location: [facility];  Service: Neurosurgery;  Laterality: Right;  SPINAL CORD STIMULATOR BATTERY EXCHANGE  . SPINAL CORD STIMULATOR IMPLANT  2010  . SPINAL FUSION  2018   L1, L2  . TARSAL SUSPENSION     Hx; of left thumb  . TONSILLECTOMY  1955  . UPPER GI ENDOSCOPY     Hx: of    REVIEW OF SYSTEMS:  A comprehensive review of systems was negative except for: Constitutional: positive for fatigue Respiratory: positive for dyspnea on exertion   PHYSICAL EXAMINATION: General appearance: alert, cooperative, fatigued and no distress Head:  Normocephalic, without obvious abnormality, atraumatic Neck: no adenopathy, no JVD, supple, symmetrical, trachea midline and thyroid not enlarged, symmetric, no tenderness/mass/nodules Lymph nodes: Cervical, supraclavicular, and axillary nodes normal. Resp: clear to auscultation bilaterally Back: symmetric, no curvature. ROM normal. No CVA tenderness. Cardio: regular rate and rhythm, S1, S2 normal, no murmur, click, rub or gallop GI: soft, non-tender; bowel sounds normal; no masses,  no organomegaly Extremities: extremities normal, atraumatic, no cyanosis or edema  ECOG PERFORMANCE STATUS: 1 - Symptomatic but completely ambulatory  Blood pressure 137/79, pulse 88, temperature 98.6 F (37 C), temperature source Oral, resp. rate 20, height 5\' 3"  (1.6 m), weight 242 lb 8 oz (110 kg), SpO2 98 %.  LABORATORY DATA: Lab Results  Component Value Date   WBC 8.2 03/06/2018   HGB 13.2 03/06/2018   HCT 41.2 03/06/2018   MCV 90.2 03/06/2018   PLT 316 03/06/2018      Chemistry      Component Value Date/Time   NA 142 09/06/2017 1347   K 3.7 09/06/2017 1347   CL 107 09/06/2017 1347   CO2 28 09/06/2017 1347   BUN 11 09/06/2017 1347   CREATININE 0.87 09/06/2017 1347      Component Value Date/Time   CALCIUM 9.1 09/06/2017 1347   ALKPHOS 121 (H) 02/06/2014 1333   AST 21 02/06/2014 1333   ALT 17 02/06/2014 1333   BILITOT 0.3 02/06/2014 1333       RADIOGRAPHIC STUDIES: No results found.  ASSESSMENT AND PLAN: This is a very pleasant 73 years old white female with history of iron deficiency anemia in addition to multiple other comorbidities.  She was treated with Feraheme infusion and had significant improvement in her hemoglobin and hematocrit as well as iron study and ferritin. The patient is currently on observation and she is feeling fine except  for mild fatigue. Repeat CBC today showed normal hemoglobin and hematocrit.  Iron study and ferritin are still pending. I recommended for the  patient to continue on observation with repeat CBC, iron study and ferritin in 6 months. Still pending lab results showed severe iron deficiency, I will consider the patient for treatment with Feraheme infusion again. She was advised to call immediately if she has any concerning symptoms in the interval. The patient voices understanding of current disease status and treatment options and is in agreement with the current care plan. All questions were answered. The patient knows to call the clinic with any problems, questions or concerns. We can certainly see the patient much sooner if necessary. I spent 10 minutes counseling the patient face to face. The total time spent in the appointment was 15 minutes.  Disclaimer: This note was dictated with voice recognition software. Similar sounding words can inadvertently be transcribed and may not be corrected upon review.

## 2018-05-04 ENCOUNTER — Encounter (HOSPITAL_BASED_OUTPATIENT_CLINIC_OR_DEPARTMENT_OTHER): Payer: Self-pay | Admitting: *Deleted

## 2018-05-04 ENCOUNTER — Other Ambulatory Visit: Payer: Self-pay

## 2018-05-04 ENCOUNTER — Other Ambulatory Visit: Payer: Self-pay | Admitting: Orthopedic Surgery

## 2018-05-05 ENCOUNTER — Ambulatory Visit (HOSPITAL_BASED_OUTPATIENT_CLINIC_OR_DEPARTMENT_OTHER): Payer: Medicare Other | Admitting: Anesthesiology

## 2018-05-05 ENCOUNTER — Encounter (HOSPITAL_BASED_OUTPATIENT_CLINIC_OR_DEPARTMENT_OTHER)
Admission: RE | Disposition: A | Discharge status: Home / Self Care | Payer: Self-pay | Source: Home / Self Care | Attending: Orthopedic Surgery

## 2018-05-05 ENCOUNTER — Ambulatory Visit (HOSPITAL_BASED_OUTPATIENT_CLINIC_OR_DEPARTMENT_OTHER)
Admission: RE | Admit: 2018-05-05 | Discharge: 2018-05-05 | Disposition: A | Discharge status: Home / Self Care | Payer: Medicare Other | Attending: Orthopedic Surgery | Admitting: Orthopedic Surgery

## 2018-05-05 ENCOUNTER — Encounter (HOSPITAL_BASED_OUTPATIENT_CLINIC_OR_DEPARTMENT_OTHER): Payer: Self-pay | Admitting: *Deleted

## 2018-05-05 ENCOUNTER — Other Ambulatory Visit: Payer: Self-pay

## 2018-05-05 DIAGNOSIS — Z801 Family history of malignant neoplasm of trachea, bronchus and lung: Secondary | ICD-10-CM | POA: Diagnosis not present

## 2018-05-05 DIAGNOSIS — Z86718 Personal history of other venous thrombosis and embolism: Secondary | ICD-10-CM | POA: Insufficient documentation

## 2018-05-05 DIAGNOSIS — J449 Chronic obstructive pulmonary disease, unspecified: Secondary | ICD-10-CM | POA: Insufficient documentation

## 2018-05-05 DIAGNOSIS — Z79899 Other long term (current) drug therapy: Secondary | ICD-10-CM | POA: Insufficient documentation

## 2018-05-05 DIAGNOSIS — Z888 Allergy status to other drugs, medicaments and biological substances status: Secondary | ICD-10-CM | POA: Insufficient documentation

## 2018-05-05 DIAGNOSIS — Z9049 Acquired absence of other specified parts of digestive tract: Secondary | ICD-10-CM | POA: Diagnosis not present

## 2018-05-05 DIAGNOSIS — G43909 Migraine, unspecified, not intractable, without status migrainosus: Secondary | ICD-10-CM | POA: Diagnosis not present

## 2018-05-05 DIAGNOSIS — Z87891 Personal history of nicotine dependence: Secondary | ICD-10-CM | POA: Diagnosis not present

## 2018-05-05 DIAGNOSIS — W19XXXA Unspecified fall, initial encounter: Secondary | ICD-10-CM | POA: Insufficient documentation

## 2018-05-05 DIAGNOSIS — Z8042 Family history of malignant neoplasm of prostate: Secondary | ICD-10-CM | POA: Diagnosis not present

## 2018-05-05 DIAGNOSIS — Z981 Arthrodesis status: Secondary | ICD-10-CM | POA: Diagnosis not present

## 2018-05-05 DIAGNOSIS — M797 Fibromyalgia: Secondary | ICD-10-CM | POA: Diagnosis not present

## 2018-05-05 DIAGNOSIS — Z91048 Other nonmedicinal substance allergy status: Secondary | ICD-10-CM | POA: Insufficient documentation

## 2018-05-05 DIAGNOSIS — Z90710 Acquired absence of both cervix and uterus: Secondary | ICD-10-CM | POA: Insufficient documentation

## 2018-05-05 DIAGNOSIS — F419 Anxiety disorder, unspecified: Secondary | ICD-10-CM | POA: Diagnosis not present

## 2018-05-05 DIAGNOSIS — Z8261 Family history of arthritis: Secondary | ICD-10-CM | POA: Insufficient documentation

## 2018-05-05 DIAGNOSIS — Z8249 Family history of ischemic heart disease and other diseases of the circulatory system: Secondary | ICD-10-CM | POA: Insufficient documentation

## 2018-05-05 DIAGNOSIS — J383 Other diseases of vocal cords: Secondary | ICD-10-CM | POA: Insufficient documentation

## 2018-05-05 DIAGNOSIS — S52571A Other intraarticular fracture of lower end of right radius, initial encounter for closed fracture: Secondary | ICD-10-CM | POA: Insufficient documentation

## 2018-05-05 DIAGNOSIS — H2703 Aphakia, bilateral: Secondary | ICD-10-CM | POA: Insufficient documentation

## 2018-05-05 DIAGNOSIS — K219 Gastro-esophageal reflux disease without esophagitis: Secondary | ICD-10-CM | POA: Insufficient documentation

## 2018-05-05 DIAGNOSIS — F319 Bipolar disorder, unspecified: Secondary | ICD-10-CM | POA: Insufficient documentation

## 2018-05-05 DIAGNOSIS — Z825 Family history of asthma and other chronic lower respiratory diseases: Secondary | ICD-10-CM | POA: Diagnosis not present

## 2018-05-05 DIAGNOSIS — Z885 Allergy status to narcotic agent status: Secondary | ICD-10-CM | POA: Insufficient documentation

## 2018-05-05 DIAGNOSIS — M47816 Spondylosis without myelopathy or radiculopathy, lumbar region: Secondary | ICD-10-CM | POA: Insufficient documentation

## 2018-05-05 HISTORY — PX: OPEN REDUCTION INTERNAL FIXATION (ORIF) DISTAL RADIAL FRACTURE: SHX5989

## 2018-05-05 SURGERY — OPEN REDUCTION INTERNAL FIXATION (ORIF) DISTAL RADIUS FRACTURE
Anesthesia: General | Site: Wrist | Laterality: Right

## 2018-05-05 MED ORDER — ACETAMINOPHEN 325 MG PO TABS: 325.0000 mg | ORAL_TABLET | ORAL | Status: DC | PRN

## 2018-05-05 MED ORDER — MIDAZOLAM HCL 2 MG/2ML IJ SOLN
INTRAMUSCULAR | Status: AC
  Filled 2018-05-05: qty 2

## 2018-05-05 MED ORDER — FENTANYL CITRATE (PF) 100 MCG/2ML IJ SOLN: 25.0000 ug | INTRAMUSCULAR | Status: DC | PRN

## 2018-05-05 MED ORDER — MIDAZOLAM HCL 2 MG/2ML IJ SOLN: 1.0000 mg | INTRAMUSCULAR | Status: DC | PRN

## 2018-05-05 MED ORDER — FENTANYL CITRATE (PF) 100 MCG/2ML IJ SOLN
INTRAMUSCULAR | Status: AC
  Filled 2018-05-05: qty 2

## 2018-05-05 MED ORDER — ACETAMINOPHEN 160 MG/5ML PO SOLN: 325.0000 mg | ORAL | Status: DC | PRN

## 2018-05-05 MED ORDER — FENTANYL CITRATE (PF) 100 MCG/2ML IJ SOLN
50.0000 ug | INTRAMUSCULAR | Status: DC | PRN
  Administered 2018-05-05 (×2): 50 ug via INTRAVENOUS

## 2018-05-05 MED ORDER — LIDOCAINE 2% (20 MG/ML) 5 ML SYRINGE
INTRAMUSCULAR | Status: AC
  Filled 2018-05-05: qty 5

## 2018-05-05 MED ORDER — CEFAZOLIN SODIUM-DEXTROSE 2-4 GM/100ML-% IV SOLN
INTRAVENOUS | Status: AC
  Filled 2018-05-05: qty 100

## 2018-05-05 MED ORDER — ONDANSETRON HCL 4 MG/2ML IJ SOLN
INTRAMUSCULAR | Status: AC
  Filled 2018-05-05: qty 2

## 2018-05-05 MED ORDER — ONDANSETRON HCL 4 MG/2ML IJ SOLN: 4.0000 mg | Freq: Once | INTRAMUSCULAR | Status: DC | PRN

## 2018-05-05 MED ORDER — BUPIVACAINE LIPOSOME 1.3 % IJ SUSP
INTRAMUSCULAR | Status: DC | PRN
  Administered 2018-05-05: 10 mL via PERINEURAL

## 2018-05-05 MED ORDER — ALBUTEROL SULFATE (2.5 MG/3ML) 0.083% IN NEBU
INHALATION_SOLUTION | RESPIRATORY_TRACT | Status: AC
  Filled 2018-05-05: qty 3

## 2018-05-05 MED ORDER — SCOPOLAMINE 1 MG/3DAYS TD PT72: 1.0000 | MEDICATED_PATCH | Freq: Once | TRANSDERMAL | Status: DC | PRN

## 2018-05-05 MED ORDER — OXYCODONE HCL 5 MG/5ML PO SOLN: 5.0000 mg | Freq: Once | ORAL | Status: DC | PRN

## 2018-05-05 MED ORDER — DEXAMETHASONE SODIUM PHOSPHATE 4 MG/ML IJ SOLN
INTRAMUSCULAR | Status: DC | PRN
  Administered 2018-05-05: 10 mg via INTRAVENOUS

## 2018-05-05 MED ORDER — MEPERIDINE HCL 25 MG/ML IJ SOLN: 6.2500 mg | INTRAMUSCULAR | Status: DC | PRN

## 2018-05-05 MED ORDER — CHLORHEXIDINE GLUCONATE 4 % EX LIQD: 60.0000 mL | Freq: Once | CUTANEOUS | Status: DC

## 2018-05-05 MED ORDER — DEXAMETHASONE SODIUM PHOSPHATE 10 MG/ML IJ SOLN
INTRAMUSCULAR | Status: AC
  Filled 2018-05-05: qty 1

## 2018-05-05 MED ORDER — BUPIVACAINE-EPINEPHRINE (PF) 0.5% -1:200000 IJ SOLN
INTRAMUSCULAR | Status: DC | PRN
  Administered 2018-05-05: 20 mL via PERINEURAL

## 2018-05-05 MED ORDER — OXYCODONE HCL 5 MG PO TABS: 5.0000 mg | ORAL_TABLET | Freq: Once | ORAL | Status: DC | PRN

## 2018-05-05 MED ORDER — LACTATED RINGERS IV SOLN
INTRAVENOUS | Status: DC
  Administered 2018-05-05 (×2): via INTRAVENOUS

## 2018-05-05 MED ORDER — PROPOFOL 10 MG/ML IV BOLUS
INTRAVENOUS | Status: DC | PRN
  Administered 2018-05-05: 60 mg via INTRAVENOUS

## 2018-05-05 MED ORDER — ALBUTEROL SULFATE (2.5 MG/3ML) 0.083% IN NEBU
2.5000 mg | INHALATION_SOLUTION | Freq: Four times a day (QID) | RESPIRATORY_TRACT | Status: DC | PRN
  Administered 2018-05-05: 2.5 mg via RESPIRATORY_TRACT

## 2018-05-05 MED ORDER — CEFAZOLIN SODIUM-DEXTROSE 2-4 GM/100ML-% IV SOLN
2.0000 g | INTRAVENOUS | Status: AC
  Administered 2018-05-05: 2 g via INTRAVENOUS

## 2018-05-05 SURGICAL SUPPLY — 66 items
BANDAGE ACE 3X5.8 VEL STRL LF (GAUZE/BANDAGES/DRESSINGS) IMPLANT
BANDAGE ACE 4X5 VEL STRL LF (GAUZE/BANDAGES/DRESSINGS) ×3 IMPLANT
BIT DRILL 2 FAST STEP (BIT) ×2 IMPLANT
BIT DRILL 2.5X4 QC (BIT) ×2 IMPLANT
BLADE SURG 15 STRL LF DISP TIS (BLADE) ×2 IMPLANT
BLADE SURG 15 STRL SS (BLADE) ×6
BNDG CMPR 9X4 STRL LF SNTH (GAUZE/BANDAGES/DRESSINGS) ×1
BNDG ESMARK 4X9 LF (GAUZE/BANDAGES/DRESSINGS) ×2 IMPLANT
BNDG GAUZE ELAST 4 BULKY (GAUZE/BANDAGES/DRESSINGS) ×3 IMPLANT
CLOSURE STERI-STRIP 1/2X4 (GAUZE/BANDAGES/DRESSINGS) ×1
CLSR STERI-STRIP ANTIMIC 1/2X4 (GAUZE/BANDAGES/DRESSINGS) ×1 IMPLANT
CORD BIPOLAR FORCEPS 12FT (ELECTRODE) ×3 IMPLANT
COVER BACK TABLE REUSABLE LG (DRAPES) ×3 IMPLANT
CUFF TOURN SGL QUICK 24 (TOURNIQUET CUFF) ×3
CUFF TRNQT CYL 24X4X16.5-23 (TOURNIQUET CUFF) IMPLANT
DRAPE EXTREMITY T 121X128X90 (DISPOSABLE) ×3 IMPLANT
DRAPE OEC MINIVIEW 54X84 (DRAPES) ×3 IMPLANT
DRAPE SURG 17X23 STRL (DRAPES) ×3 IMPLANT
DURAPREP 26ML APPLICATOR (WOUND CARE) ×3 IMPLANT
GAUZE 4X4 16PLY RFD (DISPOSABLE) IMPLANT
GAUZE SPONGE 4X4 12PLY STRL (GAUZE/BANDAGES/DRESSINGS) ×3 IMPLANT
GAUZE XEROFORM 1X8 LF (GAUZE/BANDAGES/DRESSINGS) IMPLANT
GLOVE BIO SURGEON STRL SZ 6.5 (GLOVE) ×1 IMPLANT
GLOVE BIO SURGEONS STRL SZ 6.5 (GLOVE) ×1
GLOVE SURG SYN 8.0 (GLOVE) ×3 IMPLANT
GLOVE SURG SYN 8.0 PF PI (GLOVE) ×1 IMPLANT
GOWN STRL REIN XL XLG (GOWN DISPOSABLE) ×3 IMPLANT
GOWN STRL REUS W/ TWL LRG LVL3 (GOWN DISPOSABLE) ×1 IMPLANT
GOWN STRL REUS W/TWL LRG LVL3 (GOWN DISPOSABLE) ×3
NDL HYPO 25X1 1.5 SAFETY (NEEDLE) ×1 IMPLANT
NEEDLE HYPO 25X1 1.5 SAFETY (NEEDLE) ×3 IMPLANT
NS IRRIG 1000ML POUR BTL (IV SOLUTION) ×3 IMPLANT
PACK BASIN DAY SURGERY FS (CUSTOM PROCEDURE TRAY) ×3 IMPLANT
PAD CAST 3X4 CTTN HI CHSV (CAST SUPPLIES) ×1 IMPLANT
PAD CAST 4YDX4 CTTN HI CHSV (CAST SUPPLIES) IMPLANT
PADDING CAST COTTON 3X4 STRL (CAST SUPPLIES) ×3
PADDING CAST COTTON 4X4 STRL (CAST SUPPLIES) ×3
PEG SUBCHONDRAL SMOOTH 2.0X20 (Peg) ×6 IMPLANT
PEG SUBCHONDRAL SMOOTH 2.0X22 (Peg) ×8 IMPLANT
PLATE STAN 24.4X59.5 RT (Plate) ×2 IMPLANT
SCREW BN 12X3.5XNS CORT TI (Screw) IMPLANT
SCREW BN 13X3.5XNS CORT TI (Screw) IMPLANT
SCREW CORT 3.5X10 LNG (Screw) ×2 IMPLANT
SCREW CORT 3.5X12 (Screw) ×3 IMPLANT
SCREW CORT 3.5X13 (Screw) ×3 IMPLANT
SHEET MEDIUM DRAPE 40X70 STRL (DRAPES) ×3 IMPLANT
SLEEVE SCD COMPRESS KNEE MED (MISCELLANEOUS) ×3 IMPLANT
SLING ARM FOAM STRAP LRG (SOFTGOODS) ×2 IMPLANT
SPLINT PLASTER CAST XFAST 4X15 (CAST SUPPLIES) ×15 IMPLANT
SPLINT PLASTER XTRA FAST SET 4 (CAST SUPPLIES) ×30
STOCKINETTE 4X48 STRL (DRAPES) ×3 IMPLANT
SUT CHROMIC 3 0 PS 2 (SUTURE) IMPLANT
SUT ETHILON 4 0 PS 2 18 (SUTURE) IMPLANT
SUT MERSILENE 4 0 P 3 (SUTURE) IMPLANT
SUT PROLENE 3 0 PS 2 (SUTURE) ×3 IMPLANT
SUT VIC AB 0 SH 27 (SUTURE) IMPLANT
SUT VIC AB 2-0 SH 27 (SUTURE) ×3
SUT VIC AB 2-0 SH 27XBRD (SUTURE) IMPLANT
SUT VIC AB 3-0 FS2 27 (SUTURE) IMPLANT
SUT VIC AB 4-0 RB1 18 (SUTURE) IMPLANT
SUT VICRYL RAPIDE 4-0 (SUTURE) IMPLANT
SUT VICRYL RAPIDE 4/0 PS 2 (SUTURE) IMPLANT
SYR 10ML LL (SYRINGE) ×3 IMPLANT
SYR BULB 3OZ (MISCELLANEOUS) ×3 IMPLANT
TOWEL GREEN STERILE FF (TOWEL DISPOSABLE) ×3 IMPLANT
UNDERPAD 30X30 (UNDERPADS AND DIAPERS) ×3 IMPLANT

## 2018-05-05 NOTE — H&P (Signed)
[name] is an 73 y.o. female.   Chief Complaint: Right distal forearm and wrist pain, swelling, and deformity HPI: Patient is a very pleasant 73 year old right-hand-dominant female status post fall onto an outstretched upper extremity with displaced intra-articular fracture distal radius on the dominant right side.  Past Medical History:  Diagnosis Date  . Anxiety   . Asthma   . Bipolar affective disorder (HCC)   . COPD (chronic obstructive pulmonary disease) (HCC)   . Cough   . Depression   . DJD (degenerative joint disease) of lumbar spine   . DVT (deep venous thrombosis) (HCC) 2010   groin left - on coumadin for 6 months  . Family history of anesthesia complication    Hx: father has nausea  . Fibromyalgia   . GERD (gastroesophageal reflux disease)   . Left leg DVT (HCC) 2010  . Lymphedema 2019  . Migraine   . Pneumonia   . PONV (postoperative nausea and vomiting)    and migraines  . Shortness of breath    exertion  . Spinal cord stimulator dysfunction (HCC)    has one in,but not working  . Spinal headache   . Vertigo    Hx: of  . Vocal cord dysfunction    sees dr. Delton Coombes    Past Surgical History:  Procedure Laterality Date  . ABDOMINAL HYSTERECTOMY  1980   partial   . ANTERIOR LAT LUMBAR FUSION Left 02/15/2014   Procedure: ANTERIOR LATERAL LUMBAR INTERBODY FUSION LUMBAR TWO-THREE,LUMBAR THREE-FOUR WITH LATERAL PLATE.;  Surgeon: Temple Pacini, MD;  Location: [facility];  Service: Neurosurgery;  Laterality: Left;  left   . APPENDECTOMY    . BACK SURGERY    . BREAST ENHANCEMENT SURGERY  1989  . CARPAL TUNNEL RELEASE  2006   Right hand  . CATARACT EXTRACTION W/ INTRAOCULAR LENS  IMPLANT, BILATERAL     Hx: of  . CERVICAL FUSION  2005  . CHOLECYSTECTOMY  1989  . COLONOSCOPY W/ BIOPSIES AND POLYPECTOMY     Hx: of  . FINGER ARTHROSCOPY WITH CARPOMETACARPEL (CMC) ARTHROPLASTY     thumb  . FRACTURE SURGERY Right 09/2013   ankle and leg - plate and screws  .  LUMBAR LAMINECTOMY/DECOMPRESSION MICRODISCECTOMY Right 11/06/2012   Procedure: LUMBAR TWO THREE LUMBAR LAMINECTOMY/DECOMPRESSION MICRODISCECTOMY 1 LEVEL;  Surgeon: Temple Pacini, MD;  Location: [facility];  Service: Neurosurgery;  Laterality: Right;  . ORIF ANKLE FRACTURE Right 2015  . ORIF TIBIA FRACTURE Right 2015  . PARTIAL HYSTERECTOMY  1980  . ROTATOR CUFF REPAIR Right 2017  . SHOULDER ARTHROSCOPY    . SPINAL CORD STIMULATOR BATTERY EXCHANGE Right 09/09/2017   Procedure: SPINAL CORD STIMULATOR BATTERY EXCHANGE;  Surgeon: Odette Fraction, MD;  [facility];  Service: Neurosurgery;  Laterality: Right;  SPINAL CORD STIMULATOR BATTERY EXCHANGE  . SPINAL CORD STIMULATOR IMPLANT  2010  . SPINAL FUSION  2018   L1, L2  . TARSAL SUSPENSION     Hx; of left thumb  . TONSILLECTOMY  1955  . UPPER GI ENDOSCOPY     Hx: of    Family History  Problem Relation Age of Onset  . Lung cancer Mother   . Prostate cancer Father   . Heart disease Father   . Rheum arthritis Paternal Grandmother   . Asthma Sister    Social History:  reports that she quit smoking about 31 years ago. Her smoking use included cigarettes. She has a 52.50 pack-year smoking history. She has  never used smokeless tobacco. She reports that she does not drink alcohol or use drugs.  Allergies:  Allergies  Allergen Reactions  . Lisinopril Other (See Comments)    kidney failure  . Lithium Other (See Comments)    Migraines and "drunk"  . Statins Other (See Comments)    Very weak and achy  . Adhesive [Tape] Rash    All tape, paper and steri-strips included  . Codeine Nausea And Vomiting  . Scopolamine Other (See Comments)    Causes vertigo    Medications Prior to Admission  Medication Sig Dispense Refill  . ALPRAZolam (XANAX) 1 MG tablet Take 2 mg by mouth at bedtime. May take a dose of 1 mg during the day if needed for anxiety    . AMITIZA 24 MCG capsule 2 (two) times daily with a meal.     . ARIPiprazole (ABILIFY) 30 MG  tablet Take 30 mg by mouth at bedtime.  5  . Ascorbic Acid (VITAMIN C GUMMIE PO) Take 1 tablet by mouth daily after breakfast.     . butalbital-acetaminophen-caffeine (FIORICET, ESGIC) 50-325-40 MG per tablet Take 2 tablets by mouth every 4 (four) hours as needed for headache or migraine.     . calcitonin, salmon, (MIACALCIN/FORTICAL) 200 UNIT/ACT nasal spray Place 1 spray into alternate nostrils daily after breakfast.     . calcium carbonate (OSCAL) 1500 (600 Ca) MG TABS tablet Take 600 mg of elemental calcium by mouth daily with breakfast.    . Cholecalciferol (VITAMIN D) 2000 units CAPS Take 2,000 Units by mouth daily after breakfast.    . cyclobenzaprine (FLEXERIL) 10 MG tablet Take 20 mg by mouth at bedtime.     . cycloSPORINE (RESTASIS) 0.05 % ophthalmic emulsion Place 1 drop into both eyes 2 (two) times daily.    Marland Kitchen esomeprazole (NEXIUM) 40 MG capsule Take 40 mg by mouth daily before breakfast.      . ezetimibe (ZETIA) 10 MG tablet Take 10 mg by mouth at bedtime.    . fluticasone (FLONASE) 50 MCG/ACT nasal spray Place 2 sprays into both nostrils 2 (two) times daily. 16 g 11  . HYDROcodone-acetaminophen (NORCO/VICODIN) 5-325 MG tablet Take 1 tablet by mouth every 6 (six) hours as needed for moderate pain.    . hydrOXYzine (ATARAX/VISTARIL) 25 MG tablet Take 25-50 mg by mouth 2 (two) times daily. 25 mg every morning, 50 mg every night    . lamoTRIgine (LAMICTAL) 150 MG tablet Take 300 mg by mouth at bedtime.    Marland Kitchen lisdexamfetamine (VYVANSE) 70 MG capsule Take 70 mg by mouth daily after breakfast.     . Loratadine 10 MG CAPS Take 10 mg by mouth at bedtime.     . meclizine (ANTIVERT) 25 MG tablet Take 25 mg by mouth 3 (three) times daily as needed for dizziness.     . Melatonin 300 MCG TABS Take 300 mcg by mouth at bedtime.     Marland Kitchen omeprazole (PRILOSEC) 20 MG capsule Take 20 mg by mouth at bedtime.      Bertram Gala Glycol-Propyl Glycol (SYSTANE OP) Place 1 drop into both eyes at bedtime.    .  Potassium Gluconate 595 MG CAPS Take 1 capsule by mouth 2 (two) times daily.    . pramipexole (MIRAPEX) 0.5 MG tablet Take 0.5 mg by mouth at bedtime.      . pregabalin (LYRICA) 100 MG capsule Take 100 mg by mouth daily after breakfast.    . pregabalin (LYRICA) 150 MG capsule  Take 300 mg by mouth at bedtime.    . torsemide (DEMADEX) 5 MG tablet Take 5 mg by mouth daily.    Marland Kitchen triamcinolone (KENALOG) 0.025 % cream Apply 1 application topically 2 (two) times daily as needed (itching).    . vitamin B-12 (CYANOCOBALAMIN) 1000 MCG tablet Take 1,000 mcg by mouth daily after breakfast.    . valACYclovir (VALTREX) 1000 MG tablet Take 1 tablet (1,000 mg total) by mouth 3 (three) times daily. 21 tablet 0    No results found for this or any previous visit (from the past 48 hour(s)). No results found.  Review of Systems  All other systems reviewed and are negative.   Blood pressure (!) 143/67, pulse 64, temperature 97.7 F (36.5 C), temperature source Oral, resp. rate 18, height 5\' 3"  (1.6 m), weight 109.9 kg, SpO2 99 %. Physical Exam  Constitutional: She is oriented to person, place, and time. She appears well-developed and well-nourished.  HENT:  Head: Normocephalic and atraumatic.  Neck: Normal range of motion.  Cardiovascular: Normal rate.  Respiratory: Effort normal.  Musculoskeletal:     Right wrist: She exhibits tenderness, bony tenderness, swelling and deformity.     Comments: Right distal forearm and wrist pain, swelling, and deformity status post fall  Neurological: She is alert and oriented to person, place, and time.  Skin: Skin is warm.  Psychiatric: She has a normal mood and affect. Her behavior is normal. Judgment and thought content normal.     Assessment/Plan 73 year old right-hand-dominant female with displaced intra-articular fracture distal radius on the right side.  Have discussed the role of open reduction internal fixation with volar plating and screw and plate construct  as an outpatient.  Patient understands risks and benefits and wishes to proceed  Marlowe Shores, MD 05/05/2018, 8:52 AM

## 2018-05-05 NOTE — Anesthesia Preprocedure Evaluation (Addendum)
Anesthesia Evaluation  Patient identified by MRN, date of birth, ID band Patient awake    Reviewed: Allergy & Precautions, H&P , NPO status , Patient's Chart, lab work & pertinent test results, reviewed documented beta blocker date and time   History of Anesthesia Complications (+) PONV, POST - OP SPINAL HEADACHE, Family history of anesthesia reaction and history of anesthetic complications  Airway Mallampati: II  TM Distance: >3 FB Neck ROM: full    Dental no notable dental hx.    Pulmonary COPD,  COPD inhaler, former smoker,    Pulmonary exam normal breath sounds clear to auscultation       Cardiovascular Exercise Tolerance: Good negative cardio ROS   Rhythm:regular Rate:Normal     Neuro/Psych  Headaches, PSYCHIATRIC DISORDERS Anxiety Depression Bipolar Disorder    GI/Hepatic negative GI ROS, Neg liver ROS,   Endo/Other  negative endocrine ROS  Renal/GU negative Renal ROS  negative genitourinary   Musculoskeletal  (+) Arthritis , Osteoarthritis,  Fibromyalgia -  Abdominal   Peds  Hematology negative hematology ROS (+)   Anesthesia Other Findings   Reproductive/Obstetrics negative OB ROS                             Anesthesia Physical Anesthesia Plan  ASA: III  Anesthesia Plan: General   Post-op Pain Management: GA combined w/ Regional for post-op pain   Induction:   PONV Risk Score and Plan: 4 or greater and Ondansetron, Dexamethasone, Treatment may vary due to age or medical condition, Midazolam and Propofol infusion  Airway Management Planned: Oral ETT and LMA  Additional Equipment:   Intra-op Plan:   Post-operative Plan: Extubation in OR  Informed Consent: I have reviewed the patients History and Physical, chart, labs and discussed the procedure including the risks, benefits and alternatives for the proposed anesthesia with the patient or authorized representative who  has indicated his/her understanding and acceptance.     Dental Advisory Given  Plan Discussed with: CRNA, Anesthesiologist and Surgeon  Anesthesia Plan Comments: (Discussed both nerve block for pain relief post-op and GA; including NV, sore throat, dental injury, and pulmonary complications)       Anesthesia Quick Evaluation

## 2018-05-05 NOTE — Anesthesia Postprocedure Evaluation (Signed)
Anesthesia Post Note  Patient: Kyren Orand  Procedure(s) Performed: OPEN REDUCTION INTERNAL FIXATION (ORIF) DISTAL RADIAL FRACTURE (Right Wrist)     Patient location during evaluation: PACU Anesthesia Type: General Level of consciousness: awake and alert Pain management: pain level controlled Vital Signs Assessment: post-procedure vital signs reviewed and stable Respiratory status: spontaneous breathing, nonlabored ventilation, respiratory function stable and patient connected to nasal cannula oxygen Cardiovascular status: blood pressure returned to baseline and stable Postop Assessment: no apparent nausea or vomiting Anesthetic complications: no    Last Vitals:  Vitals:   05/05/18 1115 05/05/18 1130  BP: (!) 124/52 (!) 121/50  Pulse: 64 61  Resp: 14 14  Temp:    SpO2: 99% 94%    Last Pain:  Vitals:   05/05/18 1130  TempSrc:   PainSc: 0-No pain                 Geneviene Tesch

## 2018-05-05 NOTE — Op Note (Signed)
Please see dictated report [phone]

## 2018-05-05 NOTE — Discharge Instructions (Signed)
Post Anesthesia Home Care Instructions  Activity: Get plenty of rest for the remainder of the day. A responsible individual must stay with you for 24 hours following the procedure.  For the next 24 hours, DO NOT: -Drive a car -Operate machinery -Drink alcoholic beverages -Take any medication unless instructed by your physician -Make any legal decisions or sign important papers.  Meals: Start with liquid foods such as gelatin or soup. Progress to regular foods as tolerated. Avoid greasy, spicy, heavy foods. If nausea and/or vomiting occur, drink only clear liquids until the nausea and/or vomiting subsides. Call your physician if vomiting continues.  Special Instructions/Symptoms: Your throat may feel dry or sore from the anesthesia or the breathing tube placed in your throat during surgery. If this causes discomfort, gargle with warm salt water. The discomfort should disappear within 24 hours.  If you had a scopolamine patch placed behind your ear for the management of post- operative nausea and/or vomiting:  1. The medication in the patch is effective for 72 hours, after which it should be removed.  Wrap patch in a tissue and discard in the trash. Wash hands thoroughly with soap and water. 2. You may remove the patch earlier than 72 hours if you experience unpleasant side effects which may include dry mouth, dizziness or visual disturbances. 3. Avoid touching the patch. Wash your hands with soap and water after contact with the patch.      Regional Anesthesia Blocks  1. Numbness or the inability to move the "blocked" extremity may last from 3-48 hours after placement. The length of time depends on the medication injected and your individual response to the medication. If the numbness is not going away after 48 hours, call your surgeon.  2. The extremity that is blocked will need to be protected until the numbness is gone and the  Strength has returned. Because you cannot feel it, you  will need to take extra care to avoid injury. Because it may be weak, you may have difficulty moving it or using it. You may not know what position it is in without looking at it while the block is in effect.  3. For blocks in the legs and feet, returning to weight bearing and walking needs to be done carefully. You will need to wait until the numbness is entirely gone and the strength has returned. You should be able to move your leg and foot normally before you try and bear weight or walk. You will need someone to be with you when you first try to ensure you do not fall and possibly risk injury.  4. Bruising and tenderness at the needle site are common side effects and will resolve in a few days.  5. Persistent numbness or new problems with movement should be communicated to the surgeon or the [facility] ([phone])/ [facility] ([phone]).  Information for Discharge Teaching: EXPAREL (bupivacaine liposome injectable suspension)   Your surgeon or anesthesiologist gave you EXPAREL(bupivacaine) to help control your pain after surgery.   EXPAREL is a local anesthetic that provides pain relief by numbing the tissue around the surgical site.  EXPAREL is designed to release pain medication over time and can control pain for up to 72 hours.  Depending on how you respond to EXPAREL, you may require less pain medication during your recovery.  Possible side effects:  Temporary loss of sensation or ability to move in the area where bupivacaine was injected.  Nausea, vomiting, constipation  Rarely,   numbness and tingling in your mouth or lips, lightheadedness, or anxiety may occur.  Call your doctor right away if you think you may be experiencing any of these sensations, or if you have other questions regarding possible side effects.  Follow all other discharge instructions given to you by your surgeon or nurse. Eat a healthy diet and drink plenty of water or other  fluids.  If you return to the hospital for any reason within 96 hours following the administration of EXPAREL, it is important for health care providers to know that you have received this anesthetic. A teal colored band has been placed on your arm with the date, time and amount of EXPAREL you have received in order to alert and inform your health care providers. Please leave this armband in place for the full 96 hours following administration, and then you may remove the band. 

## 2018-05-05 NOTE — Anesthesia Procedure Notes (Signed)
Anesthesia Regional Block: Supraclavicular block   Pre-Anesthetic Checklist: ,, timeout performed, Correct Patient, Correct Site, Correct Laterality, Correct Procedure, Correct Position, site marked, Risks and benefits discussed,  Surgical consent,  Pre-op evaluation,  At surgeon's request and post-op pain management  Laterality: Right  Prep: chloraprep       Needles:  Injection technique: Single-shot  Needle Type: Echogenic Stimulator Needle     Needle Length: 5cm  Needle Gauge: 22     Additional Needles:   Procedures:, nerve stimulator,,, ultrasound used (permanent image in chart),,,,   Nerve Stimulator or Paresthesia:  Response: quadraceps contraction, 0.45 mA,   Additional Responses:   Narrative:  Start time: 05/05/2018 9:49 AM End time: 05/05/2018 9:53 AM Injection made incrementally with aspirations every 5 mL.  Performed by: Personally  Anesthesiologist: Bethena Midget, MD  Additional Notes: Functioning IV was confirmed and monitors were applied.  A 39mm 22ga Arrow echogenic stimulator needle was used. Sterile prep and drape,hand hygiene and sterile gloves were used. Ultrasound guidance: relevant anatomy identified, needle position confirmed, local anesthetic spread visualized around nerve(s)., vascular puncture avoided.  Image printed for medical record. Negative aspiration and negative test dose prior to incremental administration of local anesthetic. The patient tolerated the procedure well.

## 2018-05-05 NOTE — Transfer of Care (Signed)
Immediate Anesthesia Transfer of Care Note  Patient: [name]  Procedure(s) Performed: OPEN REDUCTION INTERNAL FIXATION (ORIF) DISTAL RADIAL FRACTURE (Right Wrist)  Patient Location: PACU  Anesthesia Type:GA combined with regional for post-op pain  Level of Consciousness: awake, sedated and patient cooperative  Airway & Oxygen Therapy: Patient Spontanous Breathing and Patient connected to face mask oxygen  Post-op Assessment: Report given to RN and Post -op Vital signs reviewed and stable  Post vital signs: Reviewed and stable  Last Vitals:  Vitals Value Taken Time  BP 107/48 05/05/2018 11:07 AM  Temp    Pulse 68 05/05/2018 11:09 AM  Resp 15 05/05/2018 11:09 AM  SpO2 97 % 05/05/2018 11:09 AM  Vitals shown include unvalidated device data.  Last Pain:  Vitals:   05/05/18 0846  TempSrc: Oral  PainSc: 6       Patients Stated Pain Goal: 5 (05/05/18 0846)  Complications: No apparent anesthesia complications

## 2018-05-05 NOTE — Anesthesia Procedure Notes (Signed)
Procedure Name: LMA Insertion Date/Time: 05/05/2018 10:11 AM Performed by: Gar Gibbon, CRNA Pre-anesthesia Checklist: Patient identified, Emergency Drugs available, Suction available and Patient being monitored Patient Re-evaluated:Patient Re-evaluated prior to induction Oxygen Delivery Method: Circle system utilized Preoxygenation: Pre-oxygenation with 100% oxygen Induction Type: IV induction Ventilation: Mask ventilation without difficulty LMA: LMA inserted LMA Size: 4.0 Number of attempts: 1 Airway Equipment and Method: Bite block Placement Confirmation: positive ETCO2 Tube secured with: Tape Dental Injury: Teeth and Oropharynx as per pre-operative assessment

## 2018-05-05 NOTE — Op Note (Signed)
NAMEKATRISHA, AMUSO MEDICAL RECORD [id number] ACCOUNT [id number] DATE OF BIRTH:1945-05-24 FACILITY: [facility] LOCATION: [facility] PHYSICIAN:Lindsay Soulliere A. Mina Marble, MD  OPERATIVE REPORT  DATE OF PROCEDURE:  05/05/2018  PREOPERATIVE DIAGNOSIS:  Displaced intraarticular fracture, right distal radius.  POSTOPERATIVE DIAGNOSIS:  Displaced intraarticular fracture, right distal radius.  PROCEDURE:  Open reduction internal fixation right intraarticular distal radius.  SURGEON:  Dairl Ponder, MD  ASSISTANT:  None.  ANESTHESIA:  Regional and laryngeal mask airway (LMA).  COMPLICATIONS:  No complications.  DRAINS:  No drains.  DESCRIPTION OF PROCEDURE:  The patient was taken to the operating suite after induction of adequate general anesthetic and regional anesthetic.  The right upper extremity was prepped and draped in the usual sterile fashion.  An Esmarch was used to  exsanguinate the limb, and the tourniquet was inflated to 250 mmHg.  At this point in time, an incision was made over the palmar aspect of the distal forearm and wrist, centered over the flexor carpi radialis tendon.  Skin was incised sharply.  The  sheath overlying the FCR was incised.  This was retracted to the midline, the radial artery to the lateral side.  Dissection was carried down to the level of the pronator quadratus.  This was subperiosteally stripped off the distal radius revealing an  intraarticular 3- to 4-part distal radius fracture.  We carefully released the brachioradialis off the radial styloid fragment.  Reduction was then performed with longitudinal traction, slight flexion and ulnar deviation.  Under fluoroscopic guidance, a  standard right DVR plate was fastened to the volar distal radius through the slotted hole, and then appropriate plate position was determined.  Once this was done, 2 more cortical screws were placed proximally, followed by the smooth pegs distally as per  protocol.  The wound was  thoroughly irrigated.  Fluoroscopic imaging then revealed reduction of the fracture in all 3 views as well as good placement of hardware with no protruding hardware.  We carefully covered the plate with 2-0 undyed Vicryl, and  then a 3-0 Prolene subcuticular stitch was used on the skin.  Steri-Strips, 4 x 4 fluffs, and a palmar splint were applied.  The patient tolerated this procedure well and taken to recovery room in stable fashion.  LN/NUANCE  D:05/05/2018 T:05/05/2018 JOB:[id number]

## 2018-05-05 NOTE — Progress Notes (Signed)
Assisted Dr. Oddono with right, ultrasound guided, supraclavicular block. Side rails up, monitors on throughout procedure. See vital signs in flow sheet. Tolerated Procedure well. 

## 2018-05-09 ENCOUNTER — Encounter (HOSPITAL_BASED_OUTPATIENT_CLINIC_OR_DEPARTMENT_OTHER): Payer: Self-pay | Admitting: Orthopedic Surgery

## 2018-09-04 ENCOUNTER — Inpatient Hospital Stay: Payer: Medicare Other | Attending: Internal Medicine

## 2018-09-04 ENCOUNTER — Other Ambulatory Visit: Payer: Self-pay

## 2018-09-04 ENCOUNTER — Encounter: Payer: Self-pay | Admitting: Internal Medicine

## 2018-09-04 ENCOUNTER — Inpatient Hospital Stay (HOSPITAL_BASED_OUTPATIENT_CLINIC_OR_DEPARTMENT_OTHER): Payer: Medicare Other | Admitting: Internal Medicine

## 2018-09-04 VITALS — BP 158/82 | HR 90 | Temp 98.7°F | Resp 20 | Ht 63.0 in | Wt 250.0 lb

## 2018-09-04 DIAGNOSIS — R0602 Shortness of breath: Secondary | ICD-10-CM | POA: Insufficient documentation

## 2018-09-04 DIAGNOSIS — D509 Iron deficiency anemia, unspecified: Secondary | ICD-10-CM

## 2018-09-04 DIAGNOSIS — D508 Other iron deficiency anemias: Secondary | ICD-10-CM

## 2018-09-04 DIAGNOSIS — Z79899 Other long term (current) drug therapy: Secondary | ICD-10-CM

## 2018-09-04 DIAGNOSIS — E663 Overweight: Secondary | ICD-10-CM | POA: Diagnosis not present

## 2018-09-04 LAB — CBC WITH DIFFERENTIAL (CANCER CENTER ONLY)
Abs Immature Granulocytes: 0.06 10*3/uL (ref 0.00–0.07)
Basophils Absolute: 0.1 10*3/uL (ref 0.0–0.1)
Basophils Relative: 1 %
Eosinophils Absolute: 0.3 10*3/uL (ref 0.0–0.5)
Eosinophils Relative: 3 %
HCT: 40.8 % (ref 36.0–46.0)
Hemoglobin: 13.2 g/dL (ref 12.0–15.0)
Immature Granulocytes: 1 %
Lymphocytes Relative: 26 %
Lymphs Abs: 2.6 10*3/uL (ref 0.7–4.0)
MCH: 28.2 pg (ref 26.0–34.0)
MCHC: 32.4 g/dL (ref 30.0–36.0)
MCV: 87.2 fL (ref 80.0–100.0)
Monocytes Absolute: 0.8 10*3/uL (ref 0.1–1.0)
Monocytes Relative: 8 %
Neutro Abs: 6.3 10*3/uL (ref 1.7–7.7)
Neutrophils Relative %: 61 %
Platelet Count: 354 10*3/uL (ref 150–400)
RBC: 4.68 MIL/uL (ref 3.87–5.11)
RDW: 15 % (ref 11.5–15.5)
WBC Count: 10.1 10*3/uL (ref 4.0–10.5)
nRBC: 0 % (ref 0.0–0.2)

## 2018-09-04 LAB — IRON AND TIBC
Iron: 98 ug/dL (ref 41–142)
Saturation Ratios: 23 % (ref 21–57)
TIBC: 419 ug/dL (ref 236–444)
UIBC: 321 ug/dL (ref 120–384)

## 2018-09-04 LAB — FERRITIN: Ferritin: 27 ng/mL (ref 11–307)

## 2018-09-04 NOTE — Progress Notes (Signed)
[facility] [phone]   [phone]  OFFICE PROGRESS NOTE  Glenford Bayley, DO [place] [address]  DIAGNOSIS: Persistent microcytic anemia secondary to iron deficiency of unclear etiology probably secondary to poor intake.  PRIOR THERAPY: Feraheme infusion 510 mg IV weekly for 2 doses.  First dose April 15, 2017.  CURRENT THERAPY: Observation.  INTERVAL HISTORY: [name] 73 y.o. female returns to the clinic today for follow-up visit.  The patient is feeling fine today with no concerning complaints.  She denied having any chest pain, shortness of breath except with exertion with no cough or hemoptysis.  She denied having any fatigue or weakness.  She has no bleeding issues.  She denied having any weight loss or night sweats.  The patient is here today for evaluation with repeat CBC, iron studies and ferritin.  MEDICAL HISTORY: Past Medical History:  Diagnosis Date  . Anxiety   . Asthma   . Bipolar affective disorder ([place])   . COPD (chronic obstructive pulmonary disease) ([place])   . Cough   . Depression   . DJD (degenerative joint disease) of lumbar spine   . DVT (deep venous thrombosis) ([place]) 2010   groin left - on coumadin for 6 months  . Family history of anesthesia complication    Hx: father has nausea  . Fibromyalgia   . GERD (gastroesophageal reflux disease)   . Left leg DVT ([place]) 2010  . Lymphedema 2019  . Migraine   . Pneumonia   . PONV (postoperative nausea and vomiting)    and migraines  . Shortness of breath    exertion  . 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 Spinal headache   . Vertigo    Hx: of  . Vocal cord dysfunction    sees dr. Lamonte Sakai    ALLERGIES:  is allergic to lisinopril; lithium; statins; adhesive [tape]; codeine; and scopolamine.  MEDICATIONS:  Current Outpatient Medications  Medication Sig Dispense Refill  . ALPRAZolam (XANAX) 1 MG tablet Take 2 mg  by mouth at bedtime. May take a dose of 1 mg during the day if needed for anxiety    . AMITIZA 24 MCG capsule 2 (two) times daily with a meal.     . ARIPiprazole (ABILIFY) 30 MG tablet Take 30 mg by mouth at bedtime.  5  . Ascorbic Acid (VITAMIN C GUMMIE PO) Take 1 tablet by mouth daily after breakfast.     . butalbital-acetaminophen-caffeine (FIORICET, ESGIC) 50-325-40 MG per tablet Take 2 tablets by mouth every 4 (four) hours as needed for headache or migraine.     . calcitonin, salmon, (MIACALCIN/FORTICAL) 200 UNIT/ACT nasal spray Place 1 spray into alternate nostrils daily after breakfast.     . calcium carbonate (OSCAL) 1500 (600 Ca) MG TABS tablet Take 600 mg of elemental calcium by mouth daily with breakfast.    . Cholecalciferol (VITAMIN D) 2000 units CAPS Take 2,000 Units by mouth daily after breakfast.    . cyclobenzaprine (FLEXERIL) 10 MG tablet Take 20 mg by mouth at bedtime.     . cycloSPORINE (RESTASIS) 0.05 % ophthalmic emulsion Place 1 drop into both eyes 2 (two) times daily.    Marland Kitchen esomeprazole (NEXIUM) 40 MG capsule Take 40 mg by mouth daily before breakfast.      . ezetimibe (ZETIA) 10 MG tablet Take 10 mg by mouth at bedtime.    Marland Kitchen  fluticasone (FLONASE) 50 MCG/ACT nasal spray Place 2 sprays into both nostrils 2 (two) times daily. 16 g 11  . HYDROcodone-acetaminophen (NORCO/VICODIN) 5-325 MG tablet Take 1 tablet by mouth every 6 (six) hours as needed for moderate pain.    . hydrOXYzine (ATARAX/VISTARIL) 25 MG tablet Take 25-50 mg by mouth 2 (two) times daily. 25 mg every morning, 50 mg every night    . lamoTRIgine (LAMICTAL) 150 MG tablet Take 300 mg by mouth at bedtime.    Marland Kitchen. lisdexamfetamine (VYVANSE) 70 MG capsule Take 70 mg by mouth daily after breakfast.     . Loratadine 10 MG CAPS Take 10 mg by mouth at bedtime.     . meclizine (ANTIVERT) 25 MG tablet Take 25 mg by mouth 3 (three) times daily as needed for dizziness.     . Melatonin 300 MCG TABS Take 300 mcg by mouth at  bedtime.     Marland Kitchen. omeprazole (PRILOSEC) 20 MG capsule Take 20 mg by mouth at bedtime.      Bertram Gala. Polyethyl Glycol-Propyl Glycol (SYSTANE OP) Place 1 drop into both eyes at bedtime.    . Potassium Gluconate 595 MG CAPS Take 1 capsule by mouth 2 (two) times daily.    . pramipexole (MIRAPEX) 0.5 MG tablet Take 0.5 mg by mouth at bedtime.      . pregabalin (LYRICA) 100 MG capsule Take 100 mg by mouth daily after breakfast.    . pregabalin (LYRICA) 150 MG capsule Take 300 mg by mouth at bedtime.    . torsemide (DEMADEX) 5 MG tablet Take 5 mg by mouth daily.    Marland Kitchen. triamcinolone (KENALOG) 0.025 % cream Apply 1 application topically 2 (two) times daily as needed (itching).    . valACYclovir (VALTREX) 1000 MG tablet Take 1 tablet (1,000 mg total) by mouth 3 (three) times daily. 21 tablet 0  . vitamin B-12 (CYANOCOBALAMIN) 1000 MCG tablet Take 1,000 mcg by mouth daily after breakfast.     No current facility-administered medications for this visit.     SURGICAL HISTORY:  Past Surgical History:  Procedure Laterality Date  . ABDOMINAL HYSTERECTOMY  1980   partial   . ANTERIOR LAT LUMBAR FUSION Left 02/15/2014   Procedure: ANTERIOR LATERAL LUMBAR INTERBODY FUSION LUMBAR TWO-THREE,LUMBAR THREE-FOUR WITH LATERAL PLATE.;  Surgeon: Temple PaciniHenry A Pool, MD;  Location: [facility];  Service: Neurosurgery;  Laterality: Left;  left   . APPENDECTOMY    . BACK SURGERY    . BREAST ENHANCEMENT SURGERY  1989  . CARPAL TUNNEL RELEASE  2006   Right hand  . CATARACT EXTRACTION W/ INTRAOCULAR LENS  IMPLANT, BILATERAL     Hx: of  . CERVICAL FUSION  2005  . CHOLECYSTECTOMY  1989  . COLONOSCOPY W/ BIOPSIES AND POLYPECTOMY     Hx: of  . FINGER ARTHROSCOPY WITH CARPOMETACARPEL ([facility]) ARTHROPLASTY     thumb  . FRACTURE SURGERY Right 09/2013   ankle and leg - plate and screws  . LUMBAR LAMINECTOMY/DECOMPRESSION MICRODISCECTOMY Right 11/06/2012   Procedure: LUMBAR TWO THREE LUMBAR LAMINECTOMY/DECOMPRESSION MICRODISCECTOMY 1 LEVEL;   Surgeon: Temple PaciniHenry A Pool, MD;  Location: [facility];  Service: Neurosurgery;  Laterality: Right;  . OPEN REDUCTION INTERNAL FIXATION (ORIF) DISTAL RADIAL FRACTURE Right 05/05/2018   Procedure: OPEN REDUCTION INTERNAL FIXATION (ORIF) DISTAL RADIAL FRACTURE;  Surgeon: Dairl PonderWeingold, Matthew, MD;  Location: [facility];  Service: Orthopedics;  Laterality: Right;  . ORIF ANKLE FRACTURE Right 2015  . ORIF TIBIA FRACTURE Right 2015  .  PARTIAL HYSTERECTOMY  1980  . ROTATOR CUFF REPAIR Right 2017  . SHOULDER ARTHROSCOPY    . SPINAL CORD STIMULATOR BATTERY EXCHANGE Right 09/09/2017   Procedure: SPINAL CORD STIMULATOR BATTERY EXCHANGE;  Surgeon: Odette FractionHarkins, Paul, MD;  [facility];  Service: Neurosurgery;  Laterality: Right;  SPINAL CORD STIMULATOR BATTERY EXCHANGE  . SPINAL CORD STIMULATOR IMPLANT  2010  . SPINAL FUSION  2018   L1, L2  . TARSAL SUSPENSION     Hx; of left thumb  . TONSILLECTOMY  1955  . UPPER GI ENDOSCOPY     Hx: of    REVIEW OF SYSTEMS:  A comprehensive review of systems was negative except for: Respiratory: positive for dyspnea on exertion   PHYSICAL EXAMINATION: General appearance: alert, cooperative and no distress Head: Normocephalic, without obvious abnormality, atraumatic Neck: no adenopathy, no JVD, supple, symmetrical, trachea midline and thyroid not enlarged, symmetric, no tenderness/mass/nodules Lymph nodes: Cervical, supraclavicular, and axillary nodes normal. Resp: clear to auscultation bilaterally Back: symmetric, no curvature. ROM normal. No CVA tenderness. Cardio: regular rate and rhythm, S1, S2 normal, no murmur, click, rub or gallop GI: soft, non-tender; bowel sounds normal; no masses,  no organomegaly Extremities: extremities normal, atraumatic, no cyanosis or edema  ECOG PERFORMANCE STATUS: 1 - Symptomatic but completely ambulatory  Blood pressure (!) 158/82, pulse 90, temperature 98.7 F (37.1 C), temperature source Oral, resp. rate 20, height 5\' 3"   (1.6 m), weight 250 lb (113.4 kg), SpO2 99 %.  LABORATORY DATA: Lab Results  Component Value Date   WBC 10.1 09/04/2018   HGB 13.2 09/04/2018   HCT 40.8 09/04/2018   MCV 87.2 09/04/2018   PLT 354 09/04/2018      Chemistry      Component Value Date/Time   NA 142 09/06/2017 1347   K 3.7 09/06/2017 1347   CL 107 09/06/2017 1347   CO2 28 09/06/2017 1347   BUN 11 09/06/2017 1347   CREATININE 0.87 09/06/2017 1347      Component Value Date/Time   CALCIUM 9.1 09/06/2017 1347   ALKPHOS 121 (H) 02/06/2014 1333   AST 21 02/06/2014 1333   ALT 17 02/06/2014 1333   BILITOT 0.3 02/06/2014 1333       RADIOGRAPHIC STUDIES: No results found.  ASSESSMENT AND PLAN: This is a very pleasant 73 years old white female with history of iron deficiency anemia in addition to multiple other comorbidities.  She was treated with Feraheme infusion and had significant improvement in her hemoglobin and hematocrit as well as iron study and ferritin. She is currently on observation and she is feeling fine with no concerning complaints except for mild shortness of breath with exertion secondary to her overweight. Repeat CBC, iron study and ferritin are unremarkable for any deficiency.  There is a trend in her ferritin level towards the lower normal range. I recommended for the patient to continue on observation with repeat CBC, iron studies and ferritin in 6 months but she was also advised to call earlier if she has any concerning fatigue or weakness in the interval. The patient voices understanding of current disease status and treatment options and is in agreement with the current care plan. All questions were answered. The patient knows to call the clinic with any problems, questions or concerns. We can certainly see the patient much sooner if necessary. I spent 10 minutes counseling the patient face to face. The total time spent in the appointment was 15 minutes.  Disclaimer: This note was dictated with  voice recognition software. Similar sounding words can inadvertently be transcribed and may not be corrected upon review.

## 2018-09-05 ENCOUNTER — Telehealth: Payer: Self-pay | Admitting: Internal Medicine

## 2018-09-05 NOTE — Telephone Encounter (Signed)
Scheduled appt per 7/27 los - mailed letter with appt date and time   

## 2019-03-05 ENCOUNTER — Encounter: Payer: Self-pay | Admitting: Internal Medicine

## 2019-03-05 ENCOUNTER — Other Ambulatory Visit: Payer: Self-pay

## 2019-03-05 ENCOUNTER — Inpatient Hospital Stay: Payer: Medicare PPO | Admitting: Internal Medicine

## 2019-03-05 ENCOUNTER — Inpatient Hospital Stay: Payer: Medicare PPO | Attending: Internal Medicine

## 2019-03-05 VITALS — BP 157/76 | HR 93 | Temp 98.2°F | Resp 17 | Ht 63.0 in | Wt 256.9 lb

## 2019-03-05 DIAGNOSIS — D508 Other iron deficiency anemias: Secondary | ICD-10-CM | POA: Diagnosis not present

## 2019-03-05 DIAGNOSIS — Z86718 Personal history of other venous thrombosis and embolism: Secondary | ICD-10-CM | POA: Diagnosis not present

## 2019-03-05 DIAGNOSIS — D509 Iron deficiency anemia, unspecified: Secondary | ICD-10-CM | POA: Insufficient documentation

## 2019-03-05 DIAGNOSIS — M2550 Pain in unspecified joint: Secondary | ICD-10-CM | POA: Insufficient documentation

## 2019-03-05 DIAGNOSIS — J449 Chronic obstructive pulmonary disease, unspecified: Secondary | ICD-10-CM | POA: Diagnosis not present

## 2019-03-05 DIAGNOSIS — Z90710 Acquired absence of both cervix and uterus: Secondary | ICD-10-CM | POA: Insufficient documentation

## 2019-03-05 DIAGNOSIS — Z79899 Other long term (current) drug therapy: Secondary | ICD-10-CM | POA: Diagnosis not present

## 2019-03-05 LAB — IRON AND TIBC
Iron: 66 ug/dL (ref 41–142)
Saturation Ratios: 14 % — ABNORMAL LOW (ref 21–57)
TIBC: 453 ug/dL — ABNORMAL HIGH (ref 236–444)
UIBC: 387 ug/dL — ABNORMAL HIGH (ref 120–384)

## 2019-03-05 LAB — CBC WITH DIFFERENTIAL (CANCER CENTER ONLY)
Abs Immature Granulocytes: 0.08 10*3/uL — ABNORMAL HIGH (ref 0.00–0.07)
Basophils Absolute: 0.1 10*3/uL (ref 0.0–0.1)
Basophils Relative: 0 %
Eosinophils Absolute: 0.2 10*3/uL (ref 0.0–0.5)
Eosinophils Relative: 2 %
HCT: 42.3 % (ref 36.0–46.0)
Hemoglobin: 14 g/dL (ref 12.0–15.0)
Immature Granulocytes: 1 %
Lymphocytes Relative: 15 %
Lymphs Abs: 1.9 10*3/uL (ref 0.7–4.0)
MCH: 28.4 pg (ref 26.0–34.0)
MCHC: 33.1 g/dL (ref 30.0–36.0)
MCV: 85.8 fL (ref 80.0–100.0)
Monocytes Absolute: 1 10*3/uL (ref 0.1–1.0)
Monocytes Relative: 8 %
Neutro Abs: 9.4 10*3/uL — ABNORMAL HIGH (ref 1.7–7.7)
Neutrophils Relative %: 74 %
Platelet Count: 354 10*3/uL (ref 150–400)
RBC: 4.93 MIL/uL (ref 3.87–5.11)
RDW: 15.4 % (ref 11.5–15.5)
WBC Count: 12.6 10*3/uL — ABNORMAL HIGH (ref 4.0–10.5)
nRBC: 0 % (ref 0.0–0.2)

## 2019-03-05 LAB — FERRITIN: Ferritin: 28 ng/mL (ref 11–307)

## 2019-03-05 NOTE — Progress Notes (Signed)
[facility] [phone]   Fax:([phone]  OFFICE PROGRESS NOTE  Lenell Antu, DO [address] [place] [address]  DIAGNOSIS: Persistent microcytic anemia secondary to iron deficiency of unclear etiology probably secondary to poor intake.  PRIOR THERAPY: Feraheme infusion 510 mg IV weekly for 2 doses.  First dose April 15, 2017.  CURRENT THERAPY: Observation.  INTERVAL HISTORY: [name] 74 y.o. female returns to the clinic today for 6 months follow-up visit.  The patient is feeling fine today with no concerning complaints except for shortness of breath with exertion.  She also has arthralgia and cannot walk too long.  She denied having any chest pain, cough or hemoptysis.  She denied having any fever or chills.  She has no nausea, vomiting, diarrhea or constipation.  She is here today with repeat CBC, iron study and ferritin for evaluation of her anemia.  MEDICAL HISTORY: Past Medical History:  Diagnosis Date  . Anxiety   . Asthma   . Bipolar affective disorder (HCC)   . COPD (chronic obstructive pulmonary disease) (HCC)   . Cough   . Depression   . DJD (degenerative joint disease) of lumbar spine   . DVT (deep venous thrombosis) (HCC) 2010   groin left - on coumadin for 6 months  . Family history of anesthesia complication    Hx: father has nausea  . Fibromyalgia   . GERD (gastroesophageal reflux disease)   . Left leg DVT (HCC) 2010  . Lymphedema 2019  . Migraine   . Pneumonia   . PONV (postoperative nausea and vomiting)    and migraines  . Shortness of breath    exertion  . Spinal cord stimulator dysfunction (HCC)    has one in,but not working  . Spinal headache   . Vertigo    Hx: of  . Vocal cord dysfunction    sees dr. Delton Coombes    ALLERGIES:  is allergic to lisinopril; lithium; statins; adhesive [tape]; codeine; and scopolamine.  MEDICATIONS:  Current Outpatient Medications  Medication Sig Dispense Refill  . ALPRAZolam  (XANAX) 1 MG tablet Take 2 mg by mouth at bedtime. May take a dose of 1 mg during the day if needed for anxiety    . AMITIZA 24 MCG capsule 2 (two) times daily with a meal.     . ARIPiprazole (ABILIFY) 30 MG tablet Take 30 mg by mouth at bedtime.  5  . Ascorbic Acid (VITAMIN C GUMMIE PO) Take 1 tablet by mouth daily after breakfast.     . butalbital-acetaminophen-caffeine (FIORICET, ESGIC) 50-325-40 MG per tablet Take 2 tablets by mouth every 4 (four) hours as needed for headache or migraine.     . calcitonin, salmon, (MIACALCIN/FORTICAL) 200 UNIT/ACT nasal spray Place 1 spray into alternate nostrils daily after breakfast.     . calcium carbonate (OSCAL) 1500 (600 Ca) MG TABS tablet Take 600 mg of elemental calcium by mouth daily with breakfast.    . Cholecalciferol (VITAMIN D) 2000 units CAPS Take 2,000 Units by mouth daily after breakfast.    . cyclobenzaprine (FLEXERIL) 10 MG tablet Take 20 mg by mouth at bedtime.     . cycloSPORINE (RESTASIS) 0.05 % ophthalmic emulsion Place 1 drop into both eyes 2 (two) times daily.    Marland Kitchen esomeprazole (NEXIUM) 40 MG capsule Take 40 mg by mouth daily before breakfast.      . ezetimibe (ZETIA) 10 MG tablet Take 10 mg by  mouth at bedtime.    . fluticasone (FLONASE) 50 MCG/ACT nasal spray Place 2 sprays into both nostrils 2 (two) times daily. 16 g 11  . HYDROcodone-acetaminophen (NORCO/VICODIN) 5-325 MG tablet Take 1 tablet by mouth every 6 (six) hours as needed for moderate pain.    . hydrOXYzine (ATARAX/VISTARIL) 25 MG tablet Take 25-50 mg by mouth 2 (two) times daily. 25 mg every morning, 50 mg every night    . lamoTRIgine (LAMICTAL) 150 MG tablet Take 300 mg by mouth at bedtime.    Marland Kitchen lisdexamfetamine (VYVANSE) 70 MG capsule Take 70 mg by mouth daily after breakfast.     . Loratadine 10 MG CAPS Take 10 mg by mouth at bedtime.     . meclizine (ANTIVERT) 25 MG tablet Take 25 mg by mouth 3 (three) times daily as needed for dizziness.     . Melatonin 300 MCG TABS  Take 300 mcg by mouth at bedtime.     Marland Kitchen omeprazole (PRILOSEC) 20 MG capsule Take 20 mg by mouth at bedtime.      Vladimir Faster Glycol-Propyl Glycol (SYSTANE OP) Place 1 drop into both eyes at bedtime.    . Potassium Gluconate 595 MG CAPS Take 1 capsule by mouth 2 (two) times daily.    . pramipexole (MIRAPEX) 0.5 MG tablet Take 0.5 mg by mouth at bedtime.      . pregabalin (LYRICA) 100 MG capsule Take 100 mg by mouth daily after breakfast.    . pregabalin (LYRICA) 150 MG capsule Take 300 mg by mouth at bedtime.    . torsemide (DEMADEX) 5 MG tablet Take 5 mg by mouth daily.    Marland Kitchen triamcinolone (KENALOG) 0.025 % cream Apply 1 application topically 2 (two) times daily as needed (itching).    . valACYclovir (VALTREX) 1000 MG tablet Take 1 tablet (1,000 mg total) by mouth 3 (three) times daily. 21 tablet 0  . vitamin B-12 (CYANOCOBALAMIN) 1000 MCG tablet Take 1,000 mcg by mouth daily after breakfast.     No current facility-administered medications for this visit.    SURGICAL HISTORY:  Past Surgical History:  Procedure Laterality Date  . ABDOMINAL HYSTERECTOMY  1980   partial   . ANTERIOR LAT LUMBAR FUSION Left 02/15/2014   Procedure: ANTERIOR LATERAL LUMBAR INTERBODY FUSION LUMBAR TWO-THREE,LUMBAR THREE-FOUR WITH LATERAL PLATE.;  Surgeon: Charlie Pitter, MD;  Location: [facility];  Service: Neurosurgery;  Laterality: Left;  left   . APPENDECTOMY    . BACK SURGERY    . BREAST ENHANCEMENT SURGERY  1989  . CARPAL TUNNEL RELEASE  2006   Right hand  . CATARACT EXTRACTION W/ INTRAOCULAR LENS  IMPLANT, BILATERAL     Hx: of  . CERVICAL FUSION  2005  . CHOLECYSTECTOMY  1989  . COLONOSCOPY W/ BIOPSIES AND POLYPECTOMY     Hx: of  . FINGER ARTHROSCOPY WITH CARPOMETACARPEL (CMC) ARTHROPLASTY     thumb  . FRACTURE SURGERY Right 09/2013   ankle and leg - plate and screws  . LUMBAR LAMINECTOMY/DECOMPRESSION MICRODISCECTOMY Right 11/06/2012   Procedure: LUMBAR TWO THREE LUMBAR LAMINECTOMY/DECOMPRESSION  MICRODISCECTOMY 1 LEVEL;  Surgeon: Charlie Pitter, MD;  Location: [facility];  Service: Neurosurgery;  Laterality: Right;  . OPEN REDUCTION INTERNAL FIXATION (ORIF) DISTAL RADIAL FRACTURE Right 05/05/2018   Procedure: OPEN REDUCTION INTERNAL FIXATION (ORIF) DISTAL RADIAL FRACTURE;  Surgeon: Charlotte Crumb, MD;  Location: [place];  Service: Orthopedics;  Laterality: Right;  . ORIF ANKLE FRACTURE Right 2015  .  ORIF TIBIA FRACTURE Right 2015  . PARTIAL HYSTERECTOMY  1980  . ROTATOR CUFF REPAIR Right 2017  . SHOULDER ARTHROSCOPY    . SPINAL CORD STIMULATOR BATTERY EXCHANGE Right 09/09/2017   Procedure: SPINAL CORD STIMULATOR BATTERY EXCHANGE;  Surgeon: Odette Fraction, MD;  Location: [facility];  Service: Neurosurgery;  Laterality: Right;  SPINAL CORD STIMULATOR BATTERY EXCHANGE  . SPINAL CORD STIMULATOR IMPLANT  2010  . SPINAL FUSION  2018   L1, L2  . TARSAL SUSPENSION     Hx; of left thumb  . TONSILLECTOMY  1955  . UPPER GI ENDOSCOPY     Hx: of    REVIEW OF SYSTEMS:  A comprehensive review of systems was negative except for: Respiratory: positive for dyspnea on exertion Musculoskeletal: positive for arthralgias   PHYSICAL EXAMINATION: General appearance: alert, cooperative and no distress Head: Normocephalic, without obvious abnormality, atraumatic Neck: no adenopathy, no JVD, supple, symmetrical, trachea midline and thyroid not enlarged, symmetric, no tenderness/mass/nodules Lymph nodes: Cervical, supraclavicular, and axillary nodes normal. Resp: clear to auscultation bilaterally Back: symmetric, no curvature. ROM normal. No CVA tenderness. Cardio: regular rate and rhythm, S1, S2 normal, no murmur, click, rub or gallop GI: soft, non-tender; bowel sounds normal; no masses,  no organomegaly Extremities: extremities normal, atraumatic, no cyanosis or edema  ECOG PERFORMANCE STATUS: 1 - Symptomatic but completely ambulatory  Blood pressure (!) 157/76, pulse 93, temperature  98.2 F (36.8 C), temperature source Temporal, resp. rate 17, height 5\' 3"  (1.6 m), weight 256 lb 14.4 oz (116.5 kg), SpO2 100 %.  LABORATORY DATA: Lab Results  Component Value Date   WBC 12.6 (H) 03/05/2019   HGB 14.0 03/05/2019   HCT 42.3 03/05/2019   MCV 85.8 03/05/2019   PLT 354 03/05/2019      Chemistry      Component Value Date/Time   NA 142 09/06/2017 1347   K 3.7 09/06/2017 1347   CL 107 09/06/2017 1347   CO2 28 09/06/2017 1347   BUN 11 09/06/2017 1347   CREATININE 0.87 09/06/2017 1347      Component Value Date/Time   CALCIUM 9.1 09/06/2017 1347   ALKPHOS 121 (H) 02/06/2014 1333   AST 21 02/06/2014 1333   ALT 17 02/06/2014 1333   BILITOT 0.3 02/06/2014 1333       RADIOGRAPHIC STUDIES: No results found.  ASSESSMENT AND PLAN: This is a very pleasant 74 years old white female with history of iron deficiency anemia in addition to multiple other comorbidities.  She was treated with Feraheme infusion and had significant improvement in her hemoglobin and hematocrit as well as iron study and ferritin. The patient is currently on observation and she is feeling fine. Repeat CBC today showed normal hemoglobin and hematocrit.  Iron study and ferritin are still pending. I recommended for the patient to continue on observation with repeat CBC, iron study and ferritin in 6 months. The patient was advised to call immediately if she has any concerning symptoms in the interval. The patient voices understanding of current disease status and treatment options and is in agreement with the current care plan. All questions were answered. The patient knows to call the clinic with any problems, questions or concerns. We can certainly see the patient much sooner if necessary.  Disclaimer: This note was dictated with voice recognition software. Similar sounding words can inadvertently be transcribed and may not be corrected upon review.

## 2019-04-13 DIAGNOSIS — M25511 Pain in right shoulder: Secondary | ICD-10-CM | POA: Diagnosis not present

## 2019-04-13 DIAGNOSIS — G8929 Other chronic pain: Secondary | ICD-10-CM | POA: Diagnosis not present

## 2019-04-13 DIAGNOSIS — M542 Cervicalgia: Secondary | ICD-10-CM | POA: Diagnosis not present

## 2019-04-13 DIAGNOSIS — F1120 Opioid dependence, uncomplicated: Secondary | ICD-10-CM | POA: Diagnosis not present

## 2019-04-13 DIAGNOSIS — M25572 Pain in left ankle and joints of left foot: Secondary | ICD-10-CM | POA: Diagnosis not present

## 2019-04-13 DIAGNOSIS — M545 Low back pain: Secondary | ICD-10-CM | POA: Diagnosis not present

## 2019-04-13 DIAGNOSIS — Z79891 Long term (current) use of opiate analgesic: Secondary | ICD-10-CM | POA: Diagnosis not present

## 2019-04-13 DIAGNOSIS — M79604 Pain in right leg: Secondary | ICD-10-CM | POA: Diagnosis not present

## 2019-04-13 DIAGNOSIS — M25571 Pain in right ankle and joints of right foot: Secondary | ICD-10-CM | POA: Diagnosis not present

## 2019-04-13 DIAGNOSIS — M25561 Pain in right knee: Secondary | ICD-10-CM | POA: Diagnosis not present

## 2019-04-13 DIAGNOSIS — G894 Chronic pain syndrome: Secondary | ICD-10-CM | POA: Diagnosis not present

## 2019-05-02 DIAGNOSIS — M961 Postlaminectomy syndrome, not elsewhere classified: Secondary | ICD-10-CM | POA: Diagnosis not present

## 2019-05-02 DIAGNOSIS — I10 Essential (primary) hypertension: Secondary | ICD-10-CM | POA: Diagnosis not present

## 2019-05-02 DIAGNOSIS — Z6841 Body Mass Index (BMI) 40.0 and over, adult: Secondary | ICD-10-CM | POA: Diagnosis not present

## 2019-05-10 DIAGNOSIS — Z03818 Encounter for observation for suspected exposure to other biological agents ruled out: Secondary | ICD-10-CM | POA: Diagnosis not present

## 2019-05-15 DIAGNOSIS — G894 Chronic pain syndrome: Secondary | ICD-10-CM | POA: Diagnosis not present

## 2019-05-15 DIAGNOSIS — F1120 Opioid dependence, uncomplicated: Secondary | ICD-10-CM | POA: Diagnosis not present

## 2019-05-15 DIAGNOSIS — Z79891 Long term (current) use of opiate analgesic: Secondary | ICD-10-CM | POA: Diagnosis not present

## 2019-05-15 DIAGNOSIS — M25561 Pain in right knee: Secondary | ICD-10-CM | POA: Diagnosis not present

## 2019-05-15 DIAGNOSIS — M25571 Pain in right ankle and joints of right foot: Secondary | ICD-10-CM | POA: Diagnosis not present

## 2019-05-15 DIAGNOSIS — M25511 Pain in right shoulder: Secondary | ICD-10-CM | POA: Diagnosis not present

## 2019-05-15 DIAGNOSIS — M79604 Pain in right leg: Secondary | ICD-10-CM | POA: Diagnosis not present

## 2019-05-15 DIAGNOSIS — M542 Cervicalgia: Secondary | ICD-10-CM | POA: Diagnosis not present

## 2019-05-15 DIAGNOSIS — G8929 Other chronic pain: Secondary | ICD-10-CM | POA: Diagnosis not present

## 2019-05-15 DIAGNOSIS — M545 Low back pain: Secondary | ICD-10-CM | POA: Diagnosis not present

## 2019-05-15 DIAGNOSIS — M25572 Pain in left ankle and joints of left foot: Secondary | ICD-10-CM | POA: Diagnosis not present

## 2019-05-23 DIAGNOSIS — M961 Postlaminectomy syndrome, not elsewhere classified: Secondary | ICD-10-CM | POA: Diagnosis not present

## 2019-06-06 DIAGNOSIS — M47816 Spondylosis without myelopathy or radiculopathy, lumbar region: Secondary | ICD-10-CM | POA: Diagnosis not present

## 2019-06-06 DIAGNOSIS — M4120 Other idiopathic scoliosis, site unspecified: Secondary | ICD-10-CM | POA: Diagnosis not present

## 2019-06-06 DIAGNOSIS — Z981 Arthrodesis status: Secondary | ICD-10-CM | POA: Diagnosis not present

## 2019-06-15 DIAGNOSIS — M25561 Pain in right knee: Secondary | ICD-10-CM | POA: Diagnosis not present

## 2019-06-15 DIAGNOSIS — Z79891 Long term (current) use of opiate analgesic: Secondary | ICD-10-CM | POA: Diagnosis not present

## 2019-06-15 DIAGNOSIS — M545 Low back pain: Secondary | ICD-10-CM | POA: Diagnosis not present

## 2019-06-15 DIAGNOSIS — M79604 Pain in right leg: Secondary | ICD-10-CM | POA: Diagnosis not present

## 2019-06-15 DIAGNOSIS — M25571 Pain in right ankle and joints of right foot: Secondary | ICD-10-CM | POA: Diagnosis not present

## 2019-06-15 DIAGNOSIS — M25511 Pain in right shoulder: Secondary | ICD-10-CM | POA: Diagnosis not present

## 2019-06-15 DIAGNOSIS — G8929 Other chronic pain: Secondary | ICD-10-CM | POA: Diagnosis not present

## 2019-06-15 DIAGNOSIS — R202 Paresthesia of skin: Secondary | ICD-10-CM | POA: Diagnosis not present

## 2019-06-15 DIAGNOSIS — F1120 Opioid dependence, uncomplicated: Secondary | ICD-10-CM | POA: Diagnosis not present

## 2019-06-15 DIAGNOSIS — M542 Cervicalgia: Secondary | ICD-10-CM | POA: Diagnosis not present

## 2019-06-15 DIAGNOSIS — G894 Chronic pain syndrome: Secondary | ICD-10-CM | POA: Diagnosis not present

## 2019-06-15 DIAGNOSIS — M25572 Pain in left ankle and joints of left foot: Secondary | ICD-10-CM | POA: Diagnosis not present

## 2019-06-15 DIAGNOSIS — M25512 Pain in left shoulder: Secondary | ICD-10-CM | POA: Diagnosis not present

## 2019-06-22 DIAGNOSIS — M961 Postlaminectomy syndrome, not elsewhere classified: Secondary | ICD-10-CM | POA: Diagnosis not present

## 2019-06-25 DIAGNOSIS — S70311A Abrasion, right thigh, initial encounter: Secondary | ICD-10-CM | POA: Diagnosis not present

## 2019-06-25 DIAGNOSIS — L089 Local infection of the skin and subcutaneous tissue, unspecified: Secondary | ICD-10-CM | POA: Diagnosis not present

## 2019-07-04 DIAGNOSIS — M170 Bilateral primary osteoarthritis of knee: Secondary | ICD-10-CM | POA: Diagnosis not present

## 2019-07-11 DIAGNOSIS — M170 Bilateral primary osteoarthritis of knee: Secondary | ICD-10-CM | POA: Diagnosis not present

## 2019-07-18 DIAGNOSIS — M170 Bilateral primary osteoarthritis of knee: Secondary | ICD-10-CM | POA: Diagnosis not present

## 2019-07-20 DIAGNOSIS — Z79891 Long term (current) use of opiate analgesic: Secondary | ICD-10-CM | POA: Diagnosis not present

## 2019-07-20 DIAGNOSIS — G894 Chronic pain syndrome: Secondary | ICD-10-CM | POA: Diagnosis not present

## 2019-07-20 DIAGNOSIS — G8929 Other chronic pain: Secondary | ICD-10-CM | POA: Diagnosis not present

## 2019-07-20 DIAGNOSIS — R69 Illness, unspecified: Secondary | ICD-10-CM | POA: Diagnosis not present

## 2019-07-20 DIAGNOSIS — F1120 Opioid dependence, uncomplicated: Secondary | ICD-10-CM | POA: Diagnosis not present

## 2019-07-23 DIAGNOSIS — M961 Postlaminectomy syndrome, not elsewhere classified: Secondary | ICD-10-CM | POA: Diagnosis not present

## 2019-08-22 DIAGNOSIS — M5137 Other intervertebral disc degeneration, lumbosacral region: Secondary | ICD-10-CM | POA: Diagnosis not present

## 2019-08-22 DIAGNOSIS — M961 Postlaminectomy syndrome, not elsewhere classified: Secondary | ICD-10-CM | POA: Diagnosis not present

## 2019-08-22 DIAGNOSIS — M5430 Sciatica, unspecified side: Secondary | ICD-10-CM | POA: Diagnosis not present

## 2019-08-22 DIAGNOSIS — M545 Low back pain: Secondary | ICD-10-CM | POA: Diagnosis not present

## 2019-08-22 DIAGNOSIS — M4036 Flatback syndrome, lumbar region: Secondary | ICD-10-CM | POA: Diagnosis not present

## 2019-08-23 DIAGNOSIS — M25561 Pain in right knee: Secondary | ICD-10-CM | POA: Diagnosis not present

## 2019-08-23 DIAGNOSIS — M25572 Pain in left ankle and joints of left foot: Secondary | ICD-10-CM | POA: Diagnosis not present

## 2019-08-23 DIAGNOSIS — M25512 Pain in left shoulder: Secondary | ICD-10-CM | POA: Diagnosis not present

## 2019-08-23 DIAGNOSIS — M25511 Pain in right shoulder: Secondary | ICD-10-CM | POA: Diagnosis not present

## 2019-08-23 DIAGNOSIS — M25571 Pain in right ankle and joints of right foot: Secondary | ICD-10-CM | POA: Diagnosis not present

## 2019-08-23 DIAGNOSIS — R202 Paresthesia of skin: Secondary | ICD-10-CM | POA: Diagnosis not present

## 2019-08-23 DIAGNOSIS — M542 Cervicalgia: Secondary | ICD-10-CM | POA: Diagnosis not present

## 2019-08-23 DIAGNOSIS — M79604 Pain in right leg: Secondary | ICD-10-CM | POA: Diagnosis not present

## 2019-08-23 DIAGNOSIS — M545 Low back pain: Secondary | ICD-10-CM | POA: Diagnosis not present

## 2019-08-29 DIAGNOSIS — H52202 Unspecified astigmatism, left eye: Secondary | ICD-10-CM | POA: Diagnosis not present

## 2019-08-29 DIAGNOSIS — H532 Diplopia: Secondary | ICD-10-CM | POA: Diagnosis not present

## 2019-09-03 ENCOUNTER — Encounter: Payer: Self-pay | Admitting: Internal Medicine

## 2019-09-03 ENCOUNTER — Other Ambulatory Visit: Payer: Self-pay

## 2019-09-03 ENCOUNTER — Inpatient Hospital Stay: Payer: Medicare PPO | Attending: Internal Medicine | Admitting: Internal Medicine

## 2019-09-03 ENCOUNTER — Inpatient Hospital Stay: Payer: Medicare PPO

## 2019-09-03 VITALS — BP 151/75 | HR 89 | Temp 97.7°F | Resp 19 | Ht 63.0 in | Wt 264.4 lb

## 2019-09-03 DIAGNOSIS — F419 Anxiety disorder, unspecified: Secondary | ICD-10-CM | POA: Diagnosis not present

## 2019-09-03 DIAGNOSIS — D508 Other iron deficiency anemias: Secondary | ICD-10-CM | POA: Diagnosis not present

## 2019-09-03 DIAGNOSIS — J449 Chronic obstructive pulmonary disease, unspecified: Secondary | ICD-10-CM | POA: Diagnosis not present

## 2019-09-03 DIAGNOSIS — F319 Bipolar disorder, unspecified: Secondary | ICD-10-CM | POA: Diagnosis not present

## 2019-09-03 DIAGNOSIS — D509 Iron deficiency anemia, unspecified: Secondary | ICD-10-CM | POA: Diagnosis not present

## 2019-09-03 LAB — CBC WITH DIFFERENTIAL (CANCER CENTER ONLY)
Abs Immature Granulocytes: 0.09 10*3/uL — ABNORMAL HIGH (ref 0.00–0.07)
Basophils Absolute: 0.1 10*3/uL (ref 0.0–0.1)
Basophils Relative: 1 %
Eosinophils Absolute: 0.5 10*3/uL (ref 0.0–0.5)
Eosinophils Relative: 5 %
HCT: 38.2 % (ref 36.0–46.0)
Hemoglobin: 11.9 g/dL — ABNORMAL LOW (ref 12.0–15.0)
Immature Granulocytes: 1 %
Lymphocytes Relative: 26 %
Lymphs Abs: 2.4 10*3/uL (ref 0.7–4.0)
MCH: 26.4 pg (ref 26.0–34.0)
MCHC: 31.2 g/dL (ref 30.0–36.0)
MCV: 84.9 fL (ref 80.0–100.0)
Monocytes Absolute: 0.8 10*3/uL (ref 0.1–1.0)
Monocytes Relative: 8 %
Neutro Abs: 5.4 10*3/uL (ref 1.7–7.7)
Neutrophils Relative %: 59 %
Platelet Count: 374 10*3/uL (ref 150–400)
RBC: 4.5 MIL/uL (ref 3.87–5.11)
RDW: 16 % — ABNORMAL HIGH (ref 11.5–15.5)
WBC Count: 9.2 10*3/uL (ref 4.0–10.5)
nRBC: 0 % (ref 0.0–0.2)

## 2019-09-03 LAB — IRON AND TIBC
Iron: 63 ug/dL (ref 41–142)
Saturation Ratios: 15 % — ABNORMAL LOW (ref 21–57)
TIBC: 414 ug/dL (ref 236–444)
UIBC: 351 ug/dL (ref 120–384)

## 2019-09-03 LAB — FERRITIN: Ferritin: 34 ng/mL (ref 11–307)

## 2019-09-03 NOTE — Progress Notes (Signed)
[facility] Telephone:(336) [phone]   Fax:(336) [phone]  OFFICE PROGRESS NOTE  Koren Shiver, DO [address] [place] [address]  DIAGNOSIS: Persistent microcytic anemia secondary to iron deficiency of unclear etiology probably secondary to poor intake.  PRIOR THERAPY: Feraheme infusion 510 mg IV weekly for 2 doses.  First dose April 15, 2017.  CURRENT THERAPY: Observation.  INTERVAL HISTORY: [name] 74 y.o. female returns to the clinic today for follow-up visit.  The patient is feeling fine today with no concerning complaints except for mild fatigue and occasional dizzy spells.  She also has shortness of breath secondary to COPD and she is followed by Dr. Delton Coombes.  She denied having any chest pain, cough or hemoptysis.  She denied having any nausea, vomiting, diarrhea or constipation.  She has no headache or visual changes.  She is here today for evaluation with repeat CBC, iron study and ferritin.  MEDICAL HISTORY: Past Medical History:  Diagnosis Date  . Anxiety   . Asthma   . Bipolar affective disorder (HCC)   . COPD (chronic obstructive pulmonary disease) (HCC)   . Cough   . Depression   . DJD (degenerative joint disease) of lumbar spine   . DVT (deep venous thrombosis) (HCC) 2010   groin left - on coumadin for 6 months  . Family history of anesthesia complication    Hx: father has nausea  . Fibromyalgia   . GERD (gastroesophageal reflux disease)   . Left leg DVT (HCC) 2010  . Lymphedema 2019  . Migraine   . Pneumonia   . PONV (postoperative nausea and vomiting)    and migraines  . Shortness of breath    exertion  . Spinal cord stimulator dysfunction (HCC)    has one in,but not working  . Spinal headache   . Vertigo    Hx: of  . Vocal cord dysfunction    sees dr. Delton Coombes    ALLERGIES:  is allergic to lisinopril, lithium, statins, adhesive [tape], codeine, and scopolamine.  MEDICATIONS:  Current Outpatient Medications    Medication Sig Dispense Refill  . ALPRAZolam (XANAX) 1 MG tablet Take 2 mg by mouth at bedtime. May take a dose of 1 mg during the day if needed for anxiety    . ARIPiprazole (ABILIFY) 30 MG tablet Take 30 mg by mouth at bedtime.  5  . Ascorbic Acid (VITAMIN C GUMMIE PO) Take 1 tablet by mouth daily after breakfast.     . butalbital-acetaminophen-caffeine (FIORICET, ESGIC) 50-325-40 MG per tablet Take 2 tablets by mouth every 4 (four) hours as needed for headache or migraine.     . calcitonin, salmon, (MIACALCIN/FORTICAL) 200 UNIT/ACT nasal spray Place 1 spray into alternate nostrils daily after breakfast.     . calcium carbonate (OSCAL) 1500 (600 Ca) MG TABS tablet Take 600 mg of elemental calcium by mouth daily with breakfast.    . Cholecalciferol (VITAMIN D) 2000 units CAPS Take 2,000 Units by mouth daily after breakfast.    . cyclobenzaprine (FLEXERIL) 10 MG tablet Take 20 mg by mouth at bedtime.     . cycloSPORINE (RESTASIS) 0.05 % ophthalmic emulsion Place 1 drop into both eyes 2 (two) times daily.    Marland Kitchen esomeprazole (NEXIUM) 40 MG capsule Take 40 mg by mouth daily before breakfast.      . ezetimibe (ZETIA) 10 MG tablet Take 10 mg by mouth at bedtime.    . fluticasone (FLONASE) 50 MCG/ACT  nasal spray Place 2 sprays into both nostrils 2 (two) times daily. 16 g 11  . HYDROmorphone (DILAUDID) 2 MG tablet Take 1 tablet by mouth every 8 (eight) hours as needed.    . hydrOXYzine (ATARAX/VISTARIL) 25 MG tablet Take 25-50 mg by mouth 2 (two) times daily. 25 mg every morning, 50 mg every night    . lamoTRIgine (LAMICTAL) 150 MG tablet Take 300 mg by mouth at bedtime.    Marland Kitchen lisdexamfetamine (VYVANSE) 70 MG capsule Take 70 mg by mouth daily after breakfast.     . Loratadine 10 MG CAPS Take 10 mg by mouth at bedtime.     . meclizine (ANTIVERT) 25 MG tablet Take 25 mg by mouth 3 (three) times daily as needed for dizziness.     . Melatonin 300 MCG TABS Take 300 mcg by mouth at bedtime.     Marland Kitchen omeprazole  (PRILOSEC) 20 MG capsule Take 20 mg by mouth at bedtime.      Bertram Gala Glycol-Propyl Glycol (SYSTANE OP) Place 1 drop into both eyes at bedtime.    . Potassium Gluconate 595 MG CAPS Take 1 capsule by mouth 2 (two) times daily.    . pramipexole (MIRAPEX) 0.5 MG tablet Take 0.5 mg by mouth at bedtime.      . pregabalin (LYRICA) 300 MG capsule Take 300 mg by mouth daily with breakfast.    . pregabalin (LYRICA) 300 MG capsule Take 300 mg by mouth at bedtime.    . torsemide (DEMADEX) 5 MG tablet Take 5 mg by mouth daily.    Marland Kitchen triamcinolone (KENALOG) 0.025 % cream Apply 1 application topically 2 (two) times daily as needed (itching).    . valACYclovir (VALTREX) 1000 MG tablet Take 1 tablet (1,000 mg total) by mouth 3 (three) times daily. 21 tablet 0  . vitamin B-12 (CYANOCOBALAMIN) 1000 MCG tablet Take 1,000 mcg by mouth daily after breakfast.     No current facility-administered medications for this visit.    SURGICAL HISTORY:  Past Surgical History:  Procedure Laterality Date  . ABDOMINAL HYSTERECTOMY  1980   partial   . ANTERIOR LAT LUMBAR FUSION Left 02/15/2014   Procedure: ANTERIOR LATERAL LUMBAR INTERBODY FUSION LUMBAR TWO-THREE,LUMBAR THREE-FOUR WITH LATERAL PLATE.;  Surgeon: Temple Pacini, MD;  Location: [facility];  Service: Neurosurgery;  Laterality: Left;  left   . APPENDECTOMY    . BACK SURGERY    . BREAST ENHANCEMENT SURGERY  1989  . CARPAL TUNNEL RELEASE  2006   Right hand  . CATARACT EXTRACTION W/ INTRAOCULAR LENS  IMPLANT, BILATERAL     Hx: of  . CERVICAL FUSION  2005  . CHOLECYSTECTOMY  1989  . COLONOSCOPY W/ BIOPSIES AND POLYPECTOMY     Hx: of  . FINGER ARTHROSCOPY WITH CARPOMETACARPEL (CMC) ARTHROPLASTY     thumb  . FRACTURE SURGERY Right 09/2013   ankle and leg - plate and screws  . LUMBAR LAMINECTOMY/DECOMPRESSION MICRODISCECTOMY Right 11/06/2012   Procedure: LUMBAR TWO THREE LUMBAR LAMINECTOMY/DECOMPRESSION MICRODISCECTOMY 1 LEVEL;  Surgeon: Temple Pacini, MD;   Location: [facility];  Service: Neurosurgery;  Laterality: Right;  . OPEN REDUCTION INTERNAL FIXATION (ORIF) DISTAL RADIAL FRACTURE Right 05/05/2018   Procedure: OPEN REDUCTION INTERNAL FIXATION (ORIF) DISTAL RADIAL FRACTURE;  Surgeon: Dairl Ponder, MD;  Location: [facility];  Service: Orthopedics;  Laterality: Right;  . ORIF ANKLE FRACTURE Right 2015  . ORIF TIBIA FRACTURE Right 2015  . PARTIAL HYSTERECTOMY  1980  . ROTATOR  CUFF REPAIR Right 2017  . SHOULDER ARTHROSCOPY    . SPINAL CORD STIMULATOR BATTERY EXCHANGE Right 09/09/2017   Procedure: SPINAL CORD STIMULATOR BATTERY EXCHANGE;  Surgeon: Odette Fraction, MD;  Location: [facility];  Service: Neurosurgery;  Laterality: Right;  SPINAL CORD STIMULATOR BATTERY EXCHANGE  . SPINAL CORD STIMULATOR IMPLANT  2010  . SPINAL FUSION  2018   L1, L2  . TARSAL SUSPENSION     Hx; of left thumb  . TONSILLECTOMY  1955  . UPPER GI ENDOSCOPY     Hx: of    REVIEW OF SYSTEMS:  A comprehensive review of systems was negative except for: Constitutional: positive for fatigue Respiratory: positive for dyspnea on exertion Musculoskeletal: positive for arthralgias   PHYSICAL EXAMINATION: General appearance: alert, cooperative, fatigued and no distress Head: Normocephalic, without obvious abnormality, atraumatic Neck: no adenopathy, no JVD, supple, symmetrical, trachea midline and thyroid not enlarged, symmetric, no tenderness/mass/nodules Lymph nodes: Cervical, supraclavicular, and axillary nodes normal. Resp: clear to auscultation bilaterally Back: symmetric, no curvature. ROM normal. No CVA tenderness. Cardio: regular rate and rhythm, S1, S2 normal, no murmur, click, rub or gallop GI: soft, non-tender; bowel sounds normal; no masses,  no organomegaly Extremities: extremities normal, atraumatic, no cyanosis or edema  ECOG PERFORMANCE STATUS: 1 - Symptomatic but completely ambulatory  Blood pressure (!) 151/75, pulse 89, temperature 97.7 F  (36.5 C), temperature source Temporal, resp. rate 19, height 5\' 3"  (1.6 m), weight (!) 264 lb 6.4 oz (119.9 kg), SpO2 100 %.  LABORATORY DATA: Lab Results  Component Value Date   WBC 9.2 09/03/2019   HGB 11.9 (L) 09/03/2019   HCT 38.2 09/03/2019   MCV 84.9 09/03/2019   PLT 374 09/03/2019      Chemistry      Component Value Date/Time   NA 142 09/06/2017 1347   K 3.7 09/06/2017 1347   CL 107 09/06/2017 1347   CO2 28 09/06/2017 1347   BUN 11 09/06/2017 1347   CREATININE 0.87 09/06/2017 1347      Component Value Date/Time   CALCIUM 9.1 09/06/2017 1347   ALKPHOS 121 (H) 02/06/2014 1333   AST 21 02/06/2014 1333   ALT 17 02/06/2014 1333   BILITOT 0.3 02/06/2014 1333       RADIOGRAPHIC STUDIES: No results found.  ASSESSMENT AND PLAN: This is a very pleasant 74 years old white female with history of iron deficiency anemia in addition to multiple other comorbidities.  She was treated with Feraheme infusion and had significant improvement in her hemoglobin and hematocrit as well as iron study and ferritin. The patient has been in observation now for more than a year with no requirement for iron infusion. CBC today showed decline in her hemoglobin and hematocrit.  Iron study and ferritin are still pending. I recommended for the patient to continue on observation with repeat CBC, iron study and ferritin in 6 months unless the pending iron study showed significant iron deficiency, I will arrange for her to receive iron infusion in the interval. She was advised to call immediately if she has any other concerning symptoms in the interval. The patient voices understanding of current disease status and treatment options and is in agreement with the current care plan. All questions were answered. The patient knows to call the clinic with any problems, questions or concerns. We can certainly see the patient much sooner if necessary.  Disclaimer: This note was dictated with voice recognition  software. Similar sounding words can inadvertently be transcribed and may  not be corrected upon review.

## 2019-09-19 DIAGNOSIS — M545 Low back pain: Secondary | ICD-10-CM | POA: Diagnosis not present

## 2019-09-19 DIAGNOSIS — M4036 Flatback syndrome, lumbar region: Secondary | ICD-10-CM | POA: Diagnosis not present

## 2019-09-19 DIAGNOSIS — M5430 Sciatica, unspecified side: Secondary | ICD-10-CM | POA: Diagnosis not present

## 2019-09-19 DIAGNOSIS — M5137 Other intervertebral disc degeneration, lumbosacral region: Secondary | ICD-10-CM | POA: Diagnosis not present

## 2019-09-21 DIAGNOSIS — Z1231 Encounter for screening mammogram for malignant neoplasm of breast: Secondary | ICD-10-CM | POA: Diagnosis not present

## 2019-09-22 DIAGNOSIS — M961 Postlaminectomy syndrome, not elsewhere classified: Secondary | ICD-10-CM | POA: Diagnosis not present

## 2019-10-05 ENCOUNTER — Other Ambulatory Visit: Payer: Self-pay

## 2019-10-05 ENCOUNTER — Encounter: Payer: Self-pay | Admitting: Emergency Medicine

## 2019-10-05 ENCOUNTER — Ambulatory Visit: Payer: Medicare PPO | Admitting: Emergency Medicine

## 2019-10-05 DIAGNOSIS — R0602 Shortness of breath: Secondary | ICD-10-CM

## 2019-10-05 DIAGNOSIS — J383 Other diseases of vocal cords: Secondary | ICD-10-CM | POA: Diagnosis not present

## 2019-10-05 MED ORDER — ALBUTEROL SULFATE HFA 108 (90 BASE) MCG/ACT IN AERS: 2.0000 | INHALATION_SPRAY | Freq: Four times a day (QID) | RESPIRATORY_TRACT | 6 refills | Status: DC | PRN

## 2019-10-05 NOTE — Progress Notes (Signed)
74 yo former smoker, hx documented allergies and rhinitis, chronic cough.   ROV 08/31/17 --74 year old woman with a history of chronic upper airway irritation syndrome and chronic cough, severe rhinitis and esophageal reflux.  I also followed her for chronic progressive exertional dyspnea.  She has grossly normal airflows but with some possible mild curve to her flow volume loop.  She is been tried on bronchodilators in the past without much improvement.  She been managed on a good allergy regimen and acid suppression.  We performed a ventilation/perfusion scan 04/19/2017 that was low probability for PE.  She reports that she has been experiencing continued exertional SOB. She did do Fe infusions - seemed to help her some. She cannot walk across the room without SOB. Her cough is better, fewer throat symptoms. She rarely uses albuterol, but sometimes with a longer walk. She was tried on trelegy by Dr Conley Rolls > no benefit, had to stop because she could not tolerate powder. She is interested in starting an alternative BD if possible.   She is on flonase, loratadine, nexium and omeprazole   ROV 10/05/19 --74 year old woman (former smoker, 50 pack years) with probable mild obstructive lung disease but overall normal spirometry, chronic upper irritation syndrome and chronic cough in the setting of severe rhinitis, GERD. She has lumbar DJD, chronic knee pain.  She has chronic and progressive dyspnea. She has dyspnea with walking short distances. Has some daily cough. She has not tolerated powdered inhalers. We have tried Stiolto in the past. Currently on Flonase twice daily, Nexium daily, omeprazole nightly. Not currently on albuterol or any BD.  She is being evaluated for surgical replacement of spinal stimulator.    Vitals:   10/05/19 1646  BP: 124/64  Pulse: 86  Temp: 97.8 F (36.6 C)  TempSrc: Temporal  SpO2: 96%  Weight: 260 lb 12.8 oz (118.3 kg)  Height: 5\' 3"  (1.6 m)   Gen: Pleasant, obese woman, in  no distress,  normal affect  ENT: No lesions,  mouth clear,  oropharynx clear, no postnasal drip  Neck: No JVD, no stridor, strong voice  Lungs: No use of accessory muscles, no crackles or wheezes  Cardiovascular: RRR, heart sounds normal, no murmur or gallops, no peripheral edema  Musculoskeletal: No deformities, no cyanosis or clubbing  Neuro: alert, non focal  Skin: Warm, no lesions or rashes    CPST 07/18/12 --  Conclusion: Exercise testing with gas exchange demonstrates a normal functional capacity when compared to matched sedentary norms. There is a relative hyperventilation near peak exercise (relatively steep RCP), when VE/VCO2 is corrected to the RCP the slope remains significantly elevated at 37. The elevated VE/VCO2 slope and low HR response are concerning for an underlying circulatory limitation as primary problem. The most common type in obese individuals is diastolic dysfunction. In addition, Air flow limitation and obesity could be contributing.   Dyspnea Multifactorial dyspnea with overall intact airflows based on pulmonary function testing from 2019.  She does have upper airway lability, VCD and chronic cough.  Tolerates maintenance bronchodilators poorly and based on her intact airflow not sure that she would get much benefit anyway.  I do think it would be reasonable for her to restart albuterol to use as needed.  She is considering replacement of her spinal stimulator.  We reviewed her risks today.  She has mild to moderate increased risk due to her deconditioning, obesity but this does not preclude surgery.  She will ask her surgeon to send this paperwork for  risk assessment and clearance.  Vocal cord dysfunction Continue to control her rhinitis which contribute to her VCD  Levy Pupa, MD, PhD 10/05/2019, 5:10 PM [facility] [phone] or if no answer [phone]

## 2019-10-05 NOTE — Assessment & Plan Note (Signed)
Multifactorial dyspnea with overall intact airflows based on pulmonary function testing from 2019.  She does have upper airway lability, VCD and chronic cough.  Tolerates maintenance bronchodilators poorly and based on her intact airflow not sure that she would get much benefit anyway.  I do think it would be reasonable for her to restart albuterol to use as needed.  She is considering replacement of her spinal stimulator.  We reviewed her risks today.  She has mild to moderate increased risk due to her deconditioning, obesity but this does not preclude surgery.  She will ask her surgeon to send this paperwork for risk assessment and clearance.

## 2019-10-05 NOTE — Assessment & Plan Note (Signed)
Continue to control her rhinitis which contribute to her VCD

## 2019-10-05 NOTE — Patient Instructions (Signed)
Please continue your Flonase, Nexium and omeprazole as you have been taking them. We will send an order for albuterol.  Use 2 puffs up to every 4 hours if needed for shortness of breath, chest tightness, wheezing.  You could also consider taking this about 10 minutes before exertion to see if it helps you ambulate with less shortness of breath. You are at moderately increased risk for a surgery under general anesthesia due to your weight and overall deconditioning, mild underlying lung disease.  This does not mean that he cannot have surgery performed if the benefits outweigh these risks. Follow with Dr Delton Coombes as needed for any problems with your breathing

## 2019-10-08 DIAGNOSIS — G8929 Other chronic pain: Secondary | ICD-10-CM | POA: Diagnosis not present

## 2019-10-08 DIAGNOSIS — M5136 Other intervertebral disc degeneration, lumbar region: Secondary | ICD-10-CM | POA: Diagnosis not present

## 2019-10-08 DIAGNOSIS — M810 Age-related osteoporosis without current pathological fracture: Secondary | ICD-10-CM | POA: Diagnosis not present

## 2019-10-18 DIAGNOSIS — M25512 Pain in left shoulder: Secondary | ICD-10-CM | POA: Diagnosis not present

## 2019-10-18 DIAGNOSIS — M25561 Pain in right knee: Secondary | ICD-10-CM | POA: Diagnosis not present

## 2019-10-18 DIAGNOSIS — M542 Cervicalgia: Secondary | ICD-10-CM | POA: Diagnosis not present

## 2019-10-18 DIAGNOSIS — M25511 Pain in right shoulder: Secondary | ICD-10-CM | POA: Diagnosis not present

## 2019-10-18 DIAGNOSIS — M25571 Pain in right ankle and joints of right foot: Secondary | ICD-10-CM | POA: Diagnosis not present

## 2019-10-18 DIAGNOSIS — R202 Paresthesia of skin: Secondary | ICD-10-CM | POA: Diagnosis not present

## 2019-10-18 DIAGNOSIS — M545 Low back pain: Secondary | ICD-10-CM | POA: Diagnosis not present

## 2019-10-18 DIAGNOSIS — M25572 Pain in left ankle and joints of left foot: Secondary | ICD-10-CM | POA: Diagnosis not present

## 2019-10-18 DIAGNOSIS — G894 Chronic pain syndrome: Secondary | ICD-10-CM | POA: Diagnosis not present

## 2019-10-18 DIAGNOSIS — F1120 Opioid dependence, uncomplicated: Secondary | ICD-10-CM | POA: Diagnosis not present

## 2019-10-18 DIAGNOSIS — G8929 Other chronic pain: Secondary | ICD-10-CM | POA: Diagnosis not present

## 2019-10-18 DIAGNOSIS — Z79891 Long term (current) use of opiate analgesic: Secondary | ICD-10-CM | POA: Diagnosis not present

## 2019-10-18 DIAGNOSIS — M79604 Pain in right leg: Secondary | ICD-10-CM | POA: Diagnosis not present

## 2019-10-23 DIAGNOSIS — M961 Postlaminectomy syndrome, not elsewhere classified: Secondary | ICD-10-CM | POA: Diagnosis not present

## 2019-10-30 ENCOUNTER — Other Ambulatory Visit: Payer: Self-pay

## 2019-10-30 ENCOUNTER — Encounter (HOSPITAL_BASED_OUTPATIENT_CLINIC_OR_DEPARTMENT_OTHER): Payer: Medicare PPO | Attending: Internal Medicine | Admitting: Internal Medicine

## 2019-10-30 DIAGNOSIS — L97112 Non-pressure chronic ulcer of right thigh with fat layer exposed: Secondary | ICD-10-CM | POA: Diagnosis not present

## 2019-10-30 DIAGNOSIS — Z86718 Personal history of other venous thrombosis and embolism: Secondary | ICD-10-CM | POA: Insufficient documentation

## 2019-10-30 DIAGNOSIS — Z888 Allergy status to other drugs, medicaments and biological substances status: Secondary | ICD-10-CM | POA: Diagnosis not present

## 2019-10-30 DIAGNOSIS — Z885 Allergy status to narcotic agent status: Secondary | ICD-10-CM | POA: Diagnosis not present

## 2019-10-30 DIAGNOSIS — J449 Chronic obstructive pulmonary disease, unspecified: Secondary | ICD-10-CM | POA: Diagnosis not present

## 2019-10-30 DIAGNOSIS — L89893 Pressure ulcer of other site, stage 3: Secondary | ICD-10-CM | POA: Diagnosis not present

## 2019-10-30 DIAGNOSIS — Z87891 Personal history of nicotine dependence: Secondary | ICD-10-CM | POA: Diagnosis not present

## 2019-10-30 DIAGNOSIS — Z6841 Body Mass Index (BMI) 40.0 and over, adult: Secondary | ICD-10-CM | POA: Insufficient documentation

## 2019-10-30 DIAGNOSIS — I890 Lymphedema, not elsewhere classified: Secondary | ICD-10-CM | POA: Insufficient documentation

## 2019-10-30 NOTE — Progress Notes (Addendum)
[name], [name] ([id number]) Visit Report for 10/30/2019 Allergy List Details Patient Name: Date of Service: DO [name] 10/30/2019 10:30 A M Medical Record Number: [id number] Patient Account Number: [id number] Date of Birth/Sex: Treating RN: 1945/11/21 (74 y.o. Female) Zenaida Deed Primary Care Clenton Esper: MA Anselm Lis, [facility] NNO N Other Clinician: Referring Kaneesha Constantino: Treating Agostino Gorin/Extender: Baltazar Najjar MA SNERI, SHA NNO N Weeks in Treatment: 0 Allergies Active Allergies lisinopril Reaction: decreased kidney function Severity: Severe lithium Reaction: migraines Statins-Hmg-Coa Reductase Inhibitors Reaction: leg weakness adhesive tape Reaction: rash codeine Reaction: nausea/vomiting scopolamine Reaction: vertigo Allergy Notes Electronic Signature(s) Signed: 10/30/2019 4:05:29 PM By: Zenaida Deed RN, BSN Entered By: Zenaida Deed on 10/30/2019 10:32:42 -------------------------------------------------------------------------------- Arrival Information Details Patient Name: Date of Service: DO [name] 10/30/2019 10:30 A M Medical Record Number: [id number] Patient Account Number: [id number] Date of Birth/Sex: Treating RN: 19-Jul-1945 (74 y.o. Female) Zenaida Deed Primary Care Keilani Terrance: MA Anselm Lis, [facility] NNO N Other Clinician: Referring Shereena Berquist: Treating Mylea Roarty/Extender: Baltazar Najjar MA SNERI, SHA Wallace Going Weeks in Treatment: 0 Visit Information Patient Arrived: Walker Arrival Time: 10:15 Accompanied By: self Transfer Assistance: None Patient Identification Verified: Yes Secondary Verification Process Completed: Yes Patient Requires Transmission-Based Precautions: No Patient Has Alerts: No Electronic Signature(s) Signed: 10/30/2019 4:05:29 PM By: Zenaida Deed RN, BSN Entered By: Zenaida Deed on 10/30/2019 10:20:24 -------------------------------------------------------------------------------- Clinic Level of Care Assessment Details Patient Name:  Date of Service: DO [name] 10/30/2019 10:30 A M Medical Record Number: [id number] Patient Account Number: [id number] Date of Birth/Sex: Treating RN: [birth year] (74 y.o. Female) Yevonne Pax Primary Care Mitsy Owen: MA Anselm Lis, [facility] NNO N Other Clinician: Referring Yakub Lodes: Treating Jesalyn Finazzo/Extender: Baltazar Najjar MA SNERI, SHA NNO N Weeks in Treatment: 0 Clinic Level of Care Assessment Items TOOL 1 Quantity Score X- 1 0 Use when EandM and Procedure is performed on INITIAL visit ASSESSMENTS - Nursing Assessment / Reassessment X- 1 20 General Physical Exam (combine w/ comprehensive assessment (listed just below) when performed on new pt. evals) X- 1 25 Comprehensive Assessment (HX, ROS, Risk Assessments, Wounds Hx, etc.) ASSESSMENTS - Wound and Skin Assessment / Reassessment []  - 0 Dermatologic / Skin Assessment (not related to wound area) ASSESSMENTS - Ostomy and/or Continence Assessment and Care []  - 0 Incontinence Assessment and Management []  - 0 Ostomy Care Assessment and Management (repouching, etc.) PROCESS - Coordination of Care X - Simple Patient / Family Education for ongoing care 1 15 []  - 0 Complex (extensive) Patient / Family Education for ongoing care X- 1 10 Staff obtains , Records, T Results / Process Orders est []  - 0 Staff telephones HHA, Nursing Homes / Clarify orders / etc []  - 0 Routine Transfer to another Facility (non-emergent condition) []  - 0 Routine Hospital Admission (non-emergent condition) X- 1 15 New Admissions / / Ordering NPWT Apligraf, etc. , []  - 0 Emergency Hospital Admission (emergent condition) PROCESS - Special Needs []  - 0 Pediatric / Minor Patient Management []  - 0 Isolation Patient Management []  - 0 Hearing / Language / Visual special needs []  - 0 Assessment of Community assistance (transportation, D/C planning, etc.) []  - 0 Additional assistance / Altered mentation []  - 0 Support  Surface(s) Assessment (bed, cushion, seat, etc.) INTERVENTIONS - Miscellaneous []  - 0 External ear exam []  - 0 Patient Transfer (multiple staff / / Similar devices) []  - 0 Simple Staple / Suture removal (25 or less) []  - 0 Complex Staple / Suture removal (26 or more) []  - 0  Hypo/Hyperglycemic Management (do not check if billed separately) X- 1 15 Ankle / Brachial Index (ABI) - do not check if billed separately Has the patient been seen at the hospital within the last three years: Yes Total Score: 100 Level Of Care: New/Established - Level 3 Electronic Signature(s) Signed: 10/30/2019 4:24:48 PM By: Yevonne Pax RN Entered By: Yevonne Pax on 10/30/2019 11:29:40 -------------------------------------------------------------------------------- Encounter Discharge Information Details Patient Name: Date of Service: DO [name] 10/30/2019 10:30 A M Medical Record Number: [id number] Patient Account Number: [id number] Date of Birth/Sex: Treating RN: [birth year] (74 y.o. Female) Cherylin Mylar Primary Care Julius Matus: MA Anselm Lis, [facility] NNO N Other Clinician: Referring Cylah Fannin: Treating Rea Kalama/Extender: Baltazar Najjar MA SNERI, SHA NNO N Weeks in Treatment: 0 Encounter Discharge Information Items Post Procedure Vitals Discharge Condition: Stable Temperature (F): 98.3 Ambulatory Status: Walker Pulse (bpm): 79 Discharge Destination: Home Respiratory Rate (breaths/min): 20 Transportation: Private Auto Blood Pressure (mmHg): 144/80 Accompanied By: self Schedule Follow-up Appointment: Yes Clinical Summary of Care: Patient Declined Electronic Signature(s) Signed: 10/30/2019 4:07:33 PM By: Cherylin Mylar Entered By: Cherylin Mylar on 10/30/2019 11:48:50 -------------------------------------------------------------------------------- Lower Extremity Assessment Details Patient Name: Date of Service: DO [name] 10/30/2019 10:30 A M Medical Record Number:  [id number] Patient Account Number: [id number] Date of Birth/Sex: Treating RN: [birth year] (74 y.o. Female) Zenaida Deed Primary Care Jennaya Pogue: MA Anselm Lis, [facility] NNO N Other Clinician: Referring Juanmiguel Defelice: Treating Rilya Longo/Extender: Baltazar Najjar MA SNERI, SHA NNO N Weeks in Treatment: 0 Edema Assessment Assessed: [Left: No] [Right: No] Edema: [Left: Yes] [Right: Yes] Electronic Signature(s) Signed: 10/30/2019 4:05:29 PM By: Zenaida Deed RN, BSN Entered By: Zenaida Deed on 10/30/2019 10:53:35 -------------------------------------------------------------------------------- Multi Wound Chart Details Patient Name: Date of Service: DO [name] 10/30/2019 10:30 A M Medical Record Number: [id number] Patient Account Number: [id number] Date of Birth/Sex: Treating RN: 1945-12-08 (74 y.o. Female) Yevonne Pax Primary Care Sohum Delillo: MA Anselm Lis, [facility] NNO N Other Clinician: Referring Marinell Igarashi: Treating Seraya Jobst/Extender: Baltazar Najjar MA SNERI, SHA NNO N Weeks in Treatment: 0 Vital Signs Height(in): 63 Pulse(bpm): 79 Weight(lbs): 260 Blood Pressure(mmHg): 144/80 Body Mass Index(BMI): 46 Temperature(F): 98.3 Respiratory Rate(breaths/min): 20 Photos: [1:No Photos Right Upper Leg] [N/A:N/A N/A] Wound Location: [1:Pressure Injury] [N/A:N/A] Wounding Event: [1:Pressure Ulcer] [N/A:N/A] Primary Etiology: [1:Cataracts, Chronic sinus] [N/A:N/A] Comorbid History: [1:problems/congestion, Anemia, Lymphedema, Chronic Obstructive Pulmonary Disease (COPD), Deep Vein Thrombosis, Osteoarthritis, Confinement Anxiety 07/16/2019] [N/A:N/A] Date Acquired: [1:0] [N/A:N/A] Weeks of Treatment: [1:Open] [N/A:N/A] Wound Status: [1:0.5x1.4x0.1] [N/A:N/A] Measurements L x W x D (cm) [1:0.55] [N/A:N/A] A (cm) : rea [1:0.055] [N/A:N/A] Volume (cm) : [1:Category/Stage III] [N/A:N/A] Classification: [1:Small] [N/A:N/A] Exudate A mount: [1:Serosanguineous] [N/A:N/A] Exudate Type: [1:red, brown]  [N/A:N/A] Exudate Color: [1:Flat and Intact] [N/A:N/A] Wound Margin: [1:Large (67-100%)] [N/A:N/A] Granulation A mount: [1:Red] [N/A:N/A] Granulation Quality: [1:None Present (0%)] [N/A:N/A] Necrotic A mount: [1:Fat Layer (Subcutaneous Tissue): Yes N/A] Exposed Structures: [1:Fascia: No Tendon: No Muscle: No Joint: No Bone: No Small (1-33%)] [N/A:N/A] Epithelialization: [1:Debridement - Excisional] [N/A:N/A] Debridement: Pre-procedure Verification/Time Out 11:25 [N/A:N/A] Taken: [1:Subcutaneous, Slough] [N/A:N/A] Tissue Debrided: [1:Skin/Subcutaneous Tissue] [N/A:N/A] Level: [1:0.7] [N/A:N/A] Debridement A (sq cm): [1:rea Curette] [N/A:N/A] Instrument: [1:Moderate] [N/A:N/A] Bleeding: [1:Silver Nitrate] [N/A:N/A] Hemostasis A chieved: [1:1] [N/A:N/A] Procedural Pain: [1:0] [N/A:N/A] Post Procedural Pain: [1:Procedure was tolerated well] [N/A:N/A] Debridement Treatment Response: [1:0.5x1.4x0.1] [N/A:N/A] Post Debridement Measurements L x W x D (cm) [1:0.055] [N/A:N/A] Post Debridement Volume: (cm) [1:Category/Stage III] [N/A:N/A] Post Debridement Stage: [1:Debridement] [N/A:N/A] Treatment Notes Wound #1 (Right Upper Leg) 1. Cleanse With Wound Cleanser 3. Primary Dressing Applied  Calcium Alginate Ag 4. Secondary Dressing ABD Pad Dry Gauze 6. Support Layer Psychologist, educational) Signed: 10/30/2019 4:19:57 PM By: Baltazar Najjar MD Signed: 10/30/2019 4:24:48 PM By: Yevonne Pax RN Entered By: Baltazar Najjar on 10/30/2019 12:39:07 -------------------------------------------------------------------------------- Multi-Disciplinary Care Plan Details Patient Name: Date of Service: DO [name] 10/30/2019 10:30 A M Medical Record Number: [id number] Patient Account Number: [id number] Date of Birth/Sex: Treating RN: 1945-06-04 (73 y.o. Female) Yevonne Pax Primary Care Gee Habig: MA Anselm Lis, [place] NNO N Other Clinician: Referring Saanvi Hakala: Treating  Mansfield Dann/Extender: Selinda Eon, SHA NNO N Weeks in Treatment: 0 Active Inactive Electronic Signature(s) Signed: 11/20/2019 5:49:33 PM By: Yevonne Pax RN Previous Signature: 10/30/2019 4:24:48 PM Version By: Yevonne Pax RN Entered By: Yevonne Pax on 11/19/2019 10:03:47 -------------------------------------------------------------------------------- Pain Assessment Details Patient Name: Date of Service: DO [name] 10/30/2019 10:30 A M Medical Record Number: [id number] Patient Account Number: [id number] Date of Birth/Sex: Treating RN: Sep 14, 1945 (74 y.o. Female) Zenaida Deed Primary Care Khandi Kernes: MA Anselm Lis, [facility] NNO N Other Clinician: Referring Leyana Whidden: Treating Danira Nylander/Extender: Baltazar Najjar MA SNERI, SHA NNO N Weeks in Treatment: 0 Active Problems Location of Pain Severity and Description of Pain Patient Has Paino Yes Site Locations Pain Location: Pain Location: Generalized Pain With Dressing Change: No Duration of the Pain. Constant / Intermittento Constant Rate the pain. Current Pain Level: 5 Worst Pain Level: 10 Least Pain Level: 5 Character of Pain Describe the Pain: Aching Pain Management and Medication Current Pain Management: Medication: Yes Is the Current Pain Management Adequate: Adequate How does your wound impact your activities of daily livingo Sleep: Yes Bathing: No Appetite: No Relationship With Others: No Bladder Continence: No Emotions: No Bowel Continence: No Work: No Toileting: No Drive: No Dressing: No Hobbies: No Notes chronic pain in back, right shoulder and lower legs Electronic Signature(s) Signed: 10/30/2019 4:05:29 PM By: Zenaida Deed RN, BSN Entered By: Zenaida Deed on 10/30/2019 10:58:58 -------------------------------------------------------------------------------- Patient/Caregiver Education Details Patient Name: Date of Service: DO [name] 9/21/2021andnbsp10:30 A M Medical Record  Number: [id number] Patient Account Number: [id number] Date of Birth/Gender: Treating RN: [birth year] (74 y.o. Female) Yevonne Pax Primary Care Physician: MA Anselm Lis, [facility] NNO N Other Clinician: Referring Physician: Treating Physician/Extender: Baltazar Najjar MA SNERI, SHA [place]is Cao in Treatment: 0 Education Assessment Education Provided To: Patient Education Topics Provided Wound/Skin Impairment: Methods: Explain/Verbal Responses: State content correctly Electronic Signature(s) Signed: 10/30/2019 4:24:48 PM By: Yevonne Pax RN Entered By: Yevonne Pax on 10/30/2019 11:24:44 -------------------------------------------------------------------------------- Wound Assessment Details Patient Name: Date of Service: DO [name] 10/30/2019 10:30 A M Medical Record Number: [id number] Patient Account Number: [id number] Date of Birth/Sex: Treating RN: 1945-09-09 (74 y.o. Female) Zenaida Deed Primary Care Leoda Smithhart: MA Anselm Lis, [facility] NNO N Other Clinician: Referring Kailia Starry: Treating Retha Bither/Extender: Baltazar Najjar MA SNERI, SHA NNO N Weeks in Treatment: 0 Wound Status Wound Number: 1 Primary Pressure Ulcer Etiology: Wound Location: Right Upper Leg Wound Open Wounding Event: Pressure Injury Status: Date Acquired: 07/16/2019 Comorbid Cataracts, Chronic sinus problems/congestion, Anemia, Weeks Of Treatment: 0 History: Lymphedema, Chronic Obstructive Pulmonary Disease (COPD), Clustered Wound: No Deep Vein Thrombosis, Osteoarthritis, Confinement Anxiety Photos Photo Uploaded By: Benjaman Kindler on 10/31/2019 10:31:57 Wound Measurements Length: (cm) 0.5 Width: (cm) 1.4 Depth: (cm) 0.1 Area: (cm) 0.55 Volume: (cm) 0.055 % Reduction in Area: % Reduction in Volume: Epithelialization: Small (1-33%) Tunneling: No Undermining: No Wound Description Classification: Category/Stage III Wound Margin: Flat and Intact Exudate Amount: Small Exudate Type: Serosanguineous Exudate Color:  red, brown Foul Odor After Cleansing: No Slough/Fibrino No Wound Bed Granulation Amount: Large (67-100%) Exposed Structure Granulation Quality: Red Fascia Exposed: No Necrotic Amount: None Present (0%) Fat Layer (Subcutaneous Tissue) Exposed: Yes Tendon Exposed: No Muscle Exposed: No Joint Exposed: No Bone Exposed: No Electronic Signature(s) Signed: 10/30/2019 4:05:29 PM By: Zenaida DeedBoehlein, Linda RN, BSN Entered By: Zenaida DeedBoehlein, Linda on 10/30/2019 10:55:16 -------------------------------------------------------------------------------- Vitals Details Patient Name: Date of Service: DO Johnsie CancelSO N, LIZA NN 10/30/2019 10:30 A M Medical Record Number: [id number] Patient Account Number: [id number] Date of Birth/Sex: Treating RN: 1945-10-24 (74 y.o. Female) Zenaida DeedBoehlein, Linda Primary Care Praise Dolecki: [facility] Other Clinician: Referring Karyna Bessler: Treating Gianella Chismar/Extender: Baltazar Najjarobson, Michael MA SNERI, SHA NNO N Weeks in Treatment: 0 Vital Signs Time Taken: 10:27 Temperature (F): 98.3 Height (in): 63 Pulse (bpm): 79 Source: Stated Respiratory Rate (breaths/min): 20 Weight (lbs): 260 Blood Pressure (mmHg): 144/80 Source: Stated Reference Range: 80 - 120 mg / dl Body Mass Index (BMI): 46.1 Electronic Signature(s) Signed: 10/30/2019 4:05:29 PM By: Zenaida DeedBoehlein, Linda RN, BSN Entered By: Zenaida DeedBoehlein, Linda on 10/30/2019 10:28:15

## 2019-10-30 NOTE — Progress Notes (Signed)
Misty, [name] ([id number]) Visit Report for 10/30/2019 Abuse/Suicide Risk Screen Details Patient Name: Date of Service: DO [name] 10/30/2019 10:30 A M Medical Record Number: [id number] Patient Account Number: [id number] Date of Birth/Sex: Treating RN: December 29, 1945 (74 y.o. [name] Primary Care Tris Howell: MA Anselm Lis, [facility] Other Clinician: Referring Venora Kautzman: Treating Lyndall Bellot/Extender: Baltazar Najjar MA SNERI, [facility] NNO N Weeks in Treatment: 0 Abuse/Suicide Risk Screen Items Answer ABUSE RISK SCREEN: Has anyone close to you tried to hurt or harm you recentlyo No Do you feel uncomfortable with anyone in your familyo No Has anyone forced you do things that you didnt want to doo No Electronic Signature(s) Signed: 10/30/2019 4:05:29 PM By: Zenaida Deed RN, BSN Entered By: Zenaida Deed on 10/30/2019 10:50:09 -------------------------------------------------------------------------------- Activities of Daily Living Details Patient Name: Date of Service: DO [name] 10/30/2019 10:30 A M Medical Record Number: [id number] Patient Account Number: [id number] Date of Birth/Sex: Treating RN: Sep 04, 1945 (74 y.o. [name] Primary Care Rajohn Henery: MA Anselm Lis, [facility] Going Other Clinician: Referring Lucile Hillmann: Treating Terriann Difonzo/Extender: Baltazar Najjar MA SNERI, [facility] NNO N Weeks in Treatment: 0 Activities of Daily Living Items Answer Activities of Daily Living (Please select one for each item) Drive Automobile Completely Able T Medications ake Completely Able Use T elephone Completely Able Care for Appearance Need Assistance Use T oilet Completely Able Bath / Shower Need Assistance Dress Self Need Assistance Feed Self Completely Able Walk Need Assistance Get In / Out Bed Completely Able Housework Need Assistance Prepare Meals Need Assistance Handle Money Completely Able Shop for Self Need Assistance Electronic Signature(s) Signed: 10/30/2019 4:05:29 PM By:  Zenaida Deed RN, BSN Entered By: Zenaida Deed on 10/30/2019 10:51:40 -------------------------------------------------------------------------------- Education Screening Details Patient Name: Date of Service: DO [name] 10/30/2019 10:30 A M Medical Record Number: [id number] Patient Account Number: [id number] Date of Birth/Sex: Treating RN: 10/30/1945 (74 y.o. [name] Primary Care [name]: MA Anselm Lis, [facility] NNO N Other Clinician: Referring Nitin Mckowen: Treating Arman Loy/Extender: Baltazar Najjar MA SNERI, [facility] Weeks in Treatment: 0 Primary Learner Assessed: Patient Learning Preferences/Education Level/Primary Language Learning Preference: Explanation, Demonstration, Printed Material Highest Education Level: College or Above Preferred Language: English Cognitive Barrier Language Barrier: No Translator Needed: No Memory Deficit: No Emotional Barrier: No Cultural/Religious Beliefs Affecting Medical Care: No Physical Barrier Impaired Vision: Yes Glasses Impaired Hearing: No Decreased Hand dexterity: No Knowledge/Comprehension Knowledge Level: High Comprehension Level: High Ability to understand written instructions: High Ability to understand verbal instructions: High Motivation Anxiety Level: Calm Cooperation: Cooperative Education Importance: Acknowledges Need Interest in Health Problems: Asks Questions Perception: Coherent Willingness to Engage in Self-Management High Activities: Readiness to Engage in Self-Management High Activities: Electronic Signature(s) Signed: 10/30/2019 4:05:29 PM By: Zenaida Deed RN, BSN Entered By: Zenaida Deed on 10/30/2019 10:52:18 -------------------------------------------------------------------------------- Fall Risk Assessment Details Patient Name: Date of Service: DO [name] 10/30/2019 10:30 A M Medical Record Number: [id number] Patient Account Number: [id number] Date of Birth/Sex: Treating  RN: 1945/06/24 (74 y.o. [name] Primary Care Candi Profit: MA SNERI, [facility] NNO N Other Clinician: Referring Azhar Knope: Treating [name]/Extender: Baltazar Najjar MA SNERI, SHA NNO N Weeks in Treatment: 0 Fall Risk Assessment Items Have you had 2 or more falls in the last 12 monthso 0 Yes Have you had any fall that resulted in injury in the last 12 monthso 0 No FALLS RISK SCREEN History of falling - immediate or within 3 months 25 Yes Secondary diagnosis (Do you have 2 or more  medical diagnoseso) 0 No Ambulatory aid None/bed rest/wheelchair/nurse 0 No Crutches/cane/walker 15 Yes Furniture 0 No Intravenous therapy Access/Saline/Heparin Lock 0 No Gait/Transferring Normal/ bed rest/ wheelchair 0 No Weak (short steps with or without shuffle, stooped but able to lift head while walking, may seek 10 Yes support from furniture) Impaired (short steps with shuffle, may have difficulty arising from chair, head down, impaired 0 No balance) Mental Status Oriented to own ability 0 Yes Electronic Signature(s) Signed: 10/30/2019 4:05:29 PM By: Zenaida Deed RN, BSN Entered By: Zenaida Deed on 10/30/2019 10:52:33 -------------------------------------------------------------------------------- Foot Assessment Details Patient Name: Date of Service: DO [name] 10/30/2019 10:30 A M Medical Record Number: [id number] Patient Account Number: [id number] Date of Birth/Sex: Treating RN: 06-25-1945 (74 y.o. [name] Primary Care [name]: MA SNERI, [facility] NNO N Other Clinician: Referring Vinay Ertl: Treating Froylan Hobby/Extender: Baltazar Najjar MA SNERI, SHA NNO N Weeks in Treatment: 0 Foot Assessment Items Site Locations + = Sensation present, - = Sensation absent, C = Callus, U = Ulcer R = Redness, W = Warmth, M = Maceration, PU = Pre-ulcerative lesion F = Fissure, S = Swelling, D = Dryness Assessment Right: Left: Other Deformity: No No Prior Foot Ulcer: No No Prior  Amputation: No No Charcot Joint: No No Ambulatory Status: Ambulatory With Help Assistance Device: Walker Gait: Steady Electronic Signature(s) Signed: 10/30/2019 4:05:29 PM By: Zenaida Deed RN, BSN Entered By: Zenaida Deed on 10/30/2019 10:53:27 -------------------------------------------------------------------------------- Nutrition Risk Screening Details Patient Name: Date of Service: DO [name] 10/30/2019 10:30 A M Medical Record Number: [id number] Patient Account Number: [id number] Date of Birth/Sex: Treating RN: April 14, 1945 (74 y.o. [name] Primary Care Ryane Konieczny: MA SNERI, [facility] NNO N Other Clinician: Referring Yash Cacciola: Treating Reilley Latorre/Extender: Baltazar Najjar MA SNERI, SHA NNO N Weeks in Treatment: 0 Height (in): 63 Weight (lbs): 260 Body Mass Index (BMI): 46.1 Nutrition Risk Screening Items Score Screening NUTRITION RISK SCREEN: I have an illness or condition that made me change the kind and/or amount of food I eat 0 No I eat fewer than two meals per day 0 No I eat few fruits and vegetables, or milk products 2 Yes I have three or more drinks of beer, liquor or wine almost every day 0 No I have tooth or mouth problems that make it hard for me to eat 0 No I don't always have enough money to buy the food I need 0 No I eat alone most of the time 0 No I take three or more different prescribed or over-the-counter drugs a day 1 Yes Without wanting to, I have lost or gained 10 pounds in the last six months 0 No I am not always physically able to shop, cook and/or feed myself 2 Yes Nutrition Protocols Good Risk Protocol Moderate Risk Protocol 0 Provide education on nutrition High Risk Proctocol Risk Level: Moderate Risk Score: 5 Electronic Signature(s) Signed: 10/30/2019 4:05:29 PM By: Zenaida Deed RN, BSN Entered By: Zenaida Deed on 10/30/2019 10:53:07

## 2019-10-30 NOTE — Progress Notes (Signed)
LITZY, DICKER ([id number]) Visit Report for 10/30/2019 Chief Complaint Document Details Patient Name: Date of Service: DO [name] 10/30/2019 10:30 A M Medical Record Number: [id number] Patient Account Number: [id number] Date of Birth/Sex: Treating RN: [birth year] (74 y.o. Freddy Finner Primary Care Provider: Tiburcio Bash, [facility] NNO N Other Clinician: Referring Provider: Treating Provider/Extender: Selinda Eon, SHA Wallace Going Weeks in Treatment: 0 Information Obtained from: Patient Chief Complaint 10/30/2019; the patient is here for review of the wound on her posterior right lower thigh Electronic Signature(s) Signed: 10/30/2019 4:19:57 PM By: Baltazar Najjar MD Entered By: Baltazar Najjar on 10/30/2019 12:39:46 -------------------------------------------------------------------------------- Debridement Details Patient Name: Date of Service: DO [name] 10/30/2019 10:30 A M Medical Record Number: [id number] Patient Account Number: [id number] Date of Birth/Sex: Treating RN: 09-05-1945 (74 y.o. Freddy Finner Primary Care Provider: MA Anselm Lis, [facility] NNO N Other Clinician: Referring Provider: Treating Provider/Extender: Selinda Eon, SHA NNO N Weeks in Treatment: 0 Debridement Performed for Assessment: Wound #1 Right Upper Leg Performed By: Physician Maxwell Caul., MD Debridement Type: Debridement Level of Consciousness (Pre-procedure): Awake and Alert Pre-procedure Verification/Time Out Yes - 11:25 Taken: Start Time: 11:25 T Area Debrided (L x W): otal 0.5 (cm) x 1.4 (cm) = 0.7 (cm) Tissue and other material debrided: Viable, Non-Viable, Slough, Subcutaneous, Skin: Dermis , Slough Level: Skin/Subcutaneous Tissue Debridement Description: Excisional Instrument: Curette Bleeding: Moderate Hemostasis Achieved: Silver Nitrate End Time: 11:30 Procedural Pain: 1 Post Procedural Pain: 0 Response to Treatment: Procedure was tolerated well Level of  Consciousness (Post- Awake and Alert procedure): Post Debridement Measurements of Total Wound Length: (cm) 0.5 Stage: Category/Stage III Width: (cm) 1.4 Depth: (cm) 0.1 Volume: (cm) 0.055 Character of Wound/Ulcer Post Debridement: Improved Post Procedure Diagnosis Same as Pre-procedure Electronic Signature(s) Signed: 10/30/2019 4:19:57 PM By: Baltazar Najjar MD Signed: 10/30/2019 4:24:48 PM By: Yevonne Pax RN Entered By: Baltazar Najjar on 10/30/2019 12:39:15 -------------------------------------------------------------------------------- HPI Details Patient Name: Date of Service: DO [name] 10/30/2019 10:30 A M Medical Record Number: [id number] Patient Account Number: [id number] Date of Birth/Sex: Treating RN: April 22, 1945 (74 y.o. Freddy Finner Primary Care Provider: Tiburcio Bash, [facility] NNO N Other Clinician: Referring Provider: Treating Provider/Extender: Selinda Eon, SHA NNO N Weeks in Treatment: 0 History of Present Illness HPI Description: ADMISSION 10/30/2019 This is a 74 year old woman who was diagnosed with lymphedema by seeing Dr. Lajoyce Corners in the office in 2019. He ordered her compression pumps but I do not think she uses them that often stating that her lower extremity edema is better controlled now. I do not believe she is wearing compression stockings either. In any case she developed a wound on the back of her right thigh in June. She states she just felt some irritation in the area. She spends most of her day in the recliner and so this may actually be a pressure area although she has lymphedema in this area and states that there is fluid leaking out of the wound with a more or less constant drainage. She has tried bacitracin/Silvadene, triamcinolone mostly under a Coban compression but this has not made any difference. She is supposed to have surgery to replace the battery of a spinal cord stimulator but of course they will not do this until the wound  closes. Past medical history; vocal cord paralysis, iron deficiency anemia, lumbar spondylosis, lymphedema, spinal cord stimulator, left leg DVT fibromyalgia, , cervical fusion, lumbar spine surgery, ORIF of her right wrist fracture earlier  this year Electronic Signature(s) Signed: 10/30/2019 4:19:57 PM By: Baltazar Najjar MD Entered By: Baltazar Najjar on 10/30/2019 12:47:42 -------------------------------------------------------------------------------- Physical Exam Details Patient Name: Date of Service: DO [name] 10/30/2019 10:30 A M Medical Record Number: [id number] Patient Account Number: [id number] Date of Birth/Sex: Treating RN: [birth year] (74 y.o. Freddy Finner Primary Care Provider: Tiburcio Bash, [facility] NNO N Other Clinician: Referring Provider: Treating Provider/Extender: Baltazar Najjar MA SNERI, SHA NNO N Weeks in Treatment: 0 Constitutional Patient is hypertensive.. Pulse regular and within target range for patient.Marland Kitchen Respirations regular, non-labored and within target range.. Temperature is normal and within the target range for the patient.Marland Kitchen Appears in no distress. Neck Neck supple and symmetrical. No masses or crepitus. Cardiovascular Pedal pulses are palpable. The patient has nonpitting edema extending up into her thighs. This is dependent posteriorly. Integumentary (Hair, Skin) Other than lymphedema no primary cutaneous issues are seen. Psychiatric appears at normal baseline. Notes Wound exam; right posterior thigh the patient's area has the open wound with about 2 mm of depth. Adherent fibrinous debris over the surface. She also has a linear abrasion below this. I used a #3 curette to debride the debris off the surface of the wound surprising amount of serosanguineous drainage post debridement. Hemostasis with silver nitrate. There is no evidence of infection on the wound no palpable tenderness but there is no doubt this is in a dependent area Electronic  Signature(s) Signed: 10/30/2019 4:19:57 PM By: Baltazar Najjar MD Entered By: Baltazar Najjar on 10/30/2019 12:49:36 -------------------------------------------------------------------------------- Physician Orders Details Patient Name: Date of Service: DO [name] 10/30/2019 10:30 A M Medical Record Number: [id number] Patient Account Number: [id number] Date of Birth/Sex: Treating RN: Nov 14, 1945 (74 y.o. Freddy Finner Primary Care Provider: Tiburcio Bash, [facility] NNO N Other Clinician: Referring Provider: Treating Provider/Extender: Baltazar Najjar MA SNERI, SHA NNO N Weeks in Treatment: 0 Verbal / Phone Orders: No Diagnosis Coding Follow-up Appointments Return Appointment in 2 weeks. Dressing Change Frequency Wound #1 Right Upper Leg Change dressing every day. Wound Cleansing Clean wound with Normal Saline. Primary Wound Dressing Wound #1 Right Upper Leg Calcium Alginate with Silver Secondary Dressing Wound #1 Right Upper Leg Kerlix/Rolled Gauze - coban ABD pad Edema Control Avoid standing for long periods of time Elevate legs to the level of the heart or above for 30 minutes daily and/or when sitting, a frequency of: Exercise regularly Segmental Compressive Device. - daily times 1 hour Other: - do NOT sit on wound , avoid pressure to wound area Electronic Signature(s) Signed: 10/30/2019 4:19:57 PM By: Baltazar Najjar MD Signed: 10/30/2019 4:24:48 PM By: Yevonne Pax RN Entered By: Yevonne Pax on 10/30/2019 11:38:32 -------------------------------------------------------------------------------- Problem List Details Patient Name: Date of Service: DO [name] 10/30/2019 10:30 A M Medical Record Number: [id number] Patient Account Number: [id number] Date of Birth/Sex: Treating RN: 1945-07-12 (74 y.o. 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 Freddy Finner Primary Care Provider: MA Anselm Lis, [facility] NNO N Other Clinician: Referring Provider: Treating Provider/Extender: Selinda Eon, SHA NNO N Weeks in Treatment: 0 Subjective Chief Complaint Information obtained from Patient 10/30/2019; the patient is here for review of the wound on her posterior right lower thigh History of Present Illness (HPI) ADMISSION 10/30/2019 This is a 74 year old woman who was diagnosed with lymphedema by seeing Dr. Lajoyce Corners in the office in 2019. He ordered her compression pumps but I do not think she uses them that often stating that her lower extremity edema is better controlled now. I do not believe she is wearing compression stockings either. In any case she developed a wound on the back of her right thigh in June. She states she just felt some irritation in the area. She spends most of her day in the recliner and so this may actually be a pressure area although she has lymphedema in this area and states that there is fluid leaking out of the wound with a more or less constant drainage. She has tried bacitracin/Silvadene, triamcinolone mostly under a Coban compression but this has not made any difference. She is supposed to have surgery to replace the  battery of a spinal cord stimulator but of course they will not do this until the wound closes. Past medical history; vocal cord paralysis, iron deficiency anemia, lumbar spondylosis, lymphedema, spinal cord stimulator, left leg DVT fibromyalgia, , cervical fusion, lumbar spine surgery, ORIF of her right wrist fracture earlier this year Patient History Information obtained from Patient. Allergies lisinopril (Severity: Severe, Reaction: decreased kidney function), lithium (Reaction: migraines), Statins-Hmg-Coa Reductase Inhibitors (Reaction: leg weakness), adhesive tape (Reaction: rash), codeine (Reaction: nausea/vomiting), scopolamine (Reaction: vertigo) Family History Cancer - Mother,Father,Maternal Grandparents,Siblings, Diabetes - Paternal Grandparents,Mother, Heart Disease - Father, Stroke - Paternal Grandparents, No family history of Hereditary Spherocytosis, Hypertension, Kidney Disease, Lung Disease, Seizures, Thyroid Problems, Tuberculosis. Social History Former smoker - quit 09/16/1986, Marital Status - Married, Alcohol Use - Never - quit 1989, Drug Use - Prior History - TCH, Caffeine Use - Rarely. Medical History Eyes Patient has history of Cataracts - bil removed Denies history of Glaucoma Ear/Nose/Mouth/Throat Patient has history of Chronic sinus problems/congestion - seasonal Denies history of Middle ear problems Hematologic/Lymphatic Patient has history of Anemia - iron deficiency, Lymphedema Respiratory Patient has history of Chronic Obstructive Pulmonary Disease (COPD) Cardiovascular Patient has history of Deep Vein Thrombosis Endocrine Denies history of Type I Diabetes, Type II Diabetes Genitourinary Denies history of End Stage Renal Disease Integumentary (Skin) Denies history of History of Burn Musculoskeletal Patient has history of Osteoarthritis Oncologic Denies history of Received Chemotherapy, Received Radiation Psychiatric Patient has history of Confinement  Anxiety Denies history of Anorexia/bulimia Hospitalization/Surgery History - lumbar surgeries x4. - cervical fusion. - spinal stimulator insertion. - ORIF right radial fx. - spinal fusion. - right rotator cuff repair. - right ankle fx fixation. - right carpal tunnel release. - cholecystectomy. - breast enhancement surgery. - abdominal hysterectomy. - tonsillectomy. - appendectomy. - bilateral cataract extraction. - bil thumb arthroscopy. Medical A Surgical History Notes nd Constitutional Symptoms (General Health) morbid obesity Gastrointestinal GERD, constipation Genitourinary stress incontinence Musculoskeletal lumbar stenosis, scoliosis, fibromyalgia Neurologic herniated lumbar disc, migraines, Oncologic hx skin CA Psychiatric bipolar Review of Systems (ROS) Constitutional Symptoms (General Health) Complains or has symptoms of Fatigue - chronic. Eyes Complains or has symptoms of Glasses / Contacts. Denies complaints or symptoms of Dry Eyes, Vision Changes. Ear/Nose/Mouth/Throat Complains  or has symptoms of Chronic sinus problems or rhinitis. Respiratory Complains or has symptoms of Shortness of Breath. Denies complaints or symptoms of Chronic or frequent coughs. Cardiovascular Denies complaints or symptoms of Chest pain. Gastrointestinal Denies complaints or symptoms of Frequent diarrhea, Nausea, Vomiting. Endocrine Denies complaints or symptoms of Heat/cold intolerance. Genitourinary Denies complaints or symptoms of Frequent urination. Integumentary (Skin) Complains or has symptoms of Wounds - right posterior upper leg. Musculoskeletal Complains or has symptoms of Muscle Weakness. Neurologic Complains or has symptoms of Numbness/parasthesias - bil legs. Psychiatric Complains or has symptoms of Claustrophobia. Denies complaints or symptoms of Suicidal. Objective Constitutional Patient is hypertensive.. Pulse regular and within target range for patient.Marland Kitchen  Respirations regular, non-labored and within target range.. Temperature is normal and within the target range for the patient.Marland Kitchen Appears in no distress. Vitals Time Taken: 10:27 AM, Height: 63 in, Source: Stated, Weight: 260 lbs, Source: Stated, BMI: 46.1, Temperature: 98.3 F, Pulse: 79 bpm, Respiratory Rate: 20 breaths/min, Blood Pressure: 144/80 mmHg. Neck Neck supple and symmetrical. No masses or crepitus. Cardiovascular Pedal pulses are palpable. The patient has nonpitting edema extending up into her thighs. This is dependent posteriorly. Psychiatric appears at normal baseline. General Notes: Wound exam; right posterior thigh the patient's area has the open wound with about 2 mm of depth. Adherent fibrinous debris over the surface. She also has a linear abrasion below this. I used a #3 curette to debride the debris off the surface of the wound surprising amount of serosanguineous drainage post debridement. Hemostasis with silver nitrate. There is no evidence of infection on the wound no palpable tenderness but there is no doubt this is in a dependent area Integumentary (Hair, Skin) Other than lymphedema no primary cutaneous issues are seen. Wound #1 status is Open. Original cause of wound was Pressure Injury. The wound is located on the Right Upper Leg. The wound measures 0.5cm length x 1.4cm width x 0.1cm depth; 0.55cm^2 area and 0.055cm^3 volume. There is Fat Layer (Subcutaneous Tissue) exposed. There is no tunneling or undermining noted. There is a small amount of serosanguineous drainage noted. The wound margin is flat and intact. There is large (67-100%) red granulation within the wound bed. There is no necrotic tissue within the wound bed. Assessment Active Problems ICD-10 Non-pressure chronic ulcer of right thigh with fat layer exposed Lymphedema, not elsewhere classified Procedures Wound #1 Pre-procedure diagnosis of Wound #1 is a Pressure Ulcer located on the Right Upper Leg  . There was a Excisional Skin/Subcutaneous Tissue Debridement with a total area of 0.7 sq cm performed by Maxwell Caul., MD. With the following instrument(s): Curette to remove Viable and Non-Viable tissue/material. Material removed includes Subcutaneous Tissue, Slough, and Skin: Dermis. No specimens were taken. A time out was conducted at 11:25, prior to the start of the procedure. A Moderate amount of bleeding was controlled with Silver Nitrate. The procedure was tolerated well with a pain level of 1 throughout and a pain level of 0 following the procedure. Post Debridement Measurements: 0.5cm length x 1.4cm width x 0.1cm depth; 0.055cm^3 volume. Post debridement Stage noted as Category/Stage III. Character of Wound/Ulcer Post Debridement is improved. Post procedure Diagnosis Wound #1: Same as Pre-Procedure Plan Follow-up Appointments: Return Appointment in 2 weeks. Dressing Change Frequency: Wound #1 Right Upper Leg: Change dressing every day. Wound Cleansing: Clean wound with Normal Saline. Primary Wound Dressing: Wound #1 Right Upper Leg: Calcium Alginate with Silver Secondary Dressing: Wound #1 Right Upper Leg: Kerlix/Rolled Gauze - coban ABD  pad Edema Control: Avoid standing for long periods of time Elevate legs to the level of the heart or above for 30 minutes daily and/or when sitting, a frequency of: Exercise regularly Segmental Compressive Device. - daily times 1 hour Other: - do NOT sit on wound , avoid pressure to wound area #1 this area is in a difficult location for this patient. It is in the position where sitting in a recliner where she spent most of her day places this wound in a dependent situation for leakage of lymphedema. It is also possible that this is a pressure injury by her description 2. We will use silver alginate/ABDs/kerlix with an attempt at coban wrapping this hopefully her partner at home can help with this. 3. I have also asked her to use her  compression pumps at least once a day. I am not sure how far these go up her leg she says the wounds are about at the upper margins. If this is true then they may not be that helpful. However I think it is worth a try to see if we can get the fluid volume down around the wound area 4. I have asked her to consider trying to offload this area when she is up including lying in a couch etc. They do not have a couch they only have 2 recliner chairs. I have asked them to be creative here. Perhaps keeping a pillow under the leg or allowing her to roll onto her side back-and-forth might be helpful I spent 35 minutes in reviewing this patient's past medical records, face-to-face evaluation and preparation of this record Electronic Signature(s) Signed: 10/30/2019 4:19:57 PM By: Baltazar Najjarobson, Shakela Donati MD Entered By: Baltazar Najjarobson, Stormy Connon on 10/30/2019 12:52:26 -------------------------------------------------------------------------------- HxROS Details Patient Name: Date of Service: DO Johnsie CancelSO N, LIZA NN 10/30/2019 10:30 A M Medical Record Number: [id number] Patient Account Number: [id number] Date of Birth/Sex: Treating RN: 1945/06/02 (74 y.o. Tommye StandardF) Boehlein, Linda [facility] Other Clinician: Referring Provider: Treating Provider/Extender: Baltazar Najjarobson, Guillermo Difrancesco MA SNERI, SHA NNO N Weeks in Treatment: 0 Information Obtained From Patient Constitutional Symptoms (General Health) Complaints and Symptoms: Positive for: Fatigue - chronic Medical History: Past Medical History Notes: morbid obesity Eyes Complaints and Symptoms: Positive for: Glasses / Contacts Negative for: Dry Eyes; Vision Changes Medical History: Positive for: Cataracts - bil removed Negative for: Glaucoma Ear/Nose/Mouth/Throat Complaints and Symptoms: Positive for: Chronic sinus problems or rhinitis Medical History: Positive for: Chronic sinus problems/congestion - seasonal Negative for: Middle ear  problems Respiratory Complaints and Symptoms: Positive for: Shortness of Breath Negative for: Chronic or frequent coughs Medical History: Positive for: Chronic Obstructive Pulmonary Disease (COPD) Cardiovascular Complaints and Symptoms: Negative for: Chest pain Medical History: Positive for: Deep Vein Thrombosis Gastrointestinal Complaints and Symptoms: Negative for: Frequent diarrhea; Nausea; Vomiting Medical History: Past Medical History Notes: GERD, constipation Endocrine Complaints and Symptoms: Negative for: Heat/cold intolerance Medical History: Negative for: Type I Diabetes; Type II Diabetes Genitourinary Complaints and Symptoms: Negative for: Frequent urination Medical History: Negative for: End Stage Renal Disease Past Medical History Notes: stress incontinence Integumentary (Skin) Complaints and Symptoms: Positive for: Wounds - right posterior upper leg Medical History: Negative for: History of Burn Musculoskeletal Complaints and Symptoms: Positive for: Muscle Weakness Medical History: Positive for: Osteoarthritis Past Medical History Notes: lumbar stenosis, scoliosis, fibromyalgia Neurologic Complaints and Symptoms: Positive for: Numbness/parasthesias - bil legs Medical History: Past Medical History Notes: herniated lumbar disc, migraines, Psychiatric Complaints and Symptoms: Positive for: Claustrophobia Negative for: Suicidal Medical  History: Positive for: Confinement Anxiety Negative for: Anorexia/bulimia Past Medical History Notes: bipolar Hematologic/Lymphatic Medical History: Positive for: Anemia - iron deficiency; Lymphedema Immunological Oncologic Medical History: Negative for: Received Chemotherapy; Received Radiation Past Medical History Notes: hx skin CA HBO Extended History Items Ear/Nose/Mouth/Throat: Eyes: Eyes: Chronic sinus Cataracts problems/congestion Immunizations Pneumococcal Vaccine: Received Pneumococcal  Vaccination: Yes Implantable Devices Yes Hospitalization / Surgery History Type of Hospitalization/Surgery lumbar surgeries x4 cervical fusion spinal stimulator insertion ORIF right radial fx spinal fusion right rotator cuff repair right ankle fx fixation right carpal tunnel release cholecystectomy breast enhancement surgery abdominal hysterectomy tonsillectomy appendectomy bilateral cataract extraction bil thumb arthroscopy Family and Social History Cancer: Yes - Mother,Father,Maternal Grandparents,Siblings; Diabetes: Yes - Paternal Grandparents,Mother; Heart Disease: Yes - Father; Hereditary Spherocytosis: No; Hypertension: No; Kidney Disease: No; Lung Disease: No; Seizures: No; Stroke: Yes - Paternal Grandparents; Thyroid Problems: No; Tuberculosis: No; Former smoker - quit 09/16/1986; Marital Status - Married; Alcohol Use: Never - quit 1989; Drug Use: Prior History - TCH; Caffeine Use: Rarely; Financial Concerns: No; Food, Clothing or Shelter Needs: No; Support System Lacking: No; Transportation Concerns: No Psychologist, prison and probation services) Signed: 10/30/2019 4:05:29 PM By: Zenaida Deed RN, BSN Signed: 10/30/2019 4:19:57 PM By: Baltazar Najjar MD Entered By: Zenaida Deed on 10/30/2019 10:49:00 -------------------------------------------------------------------------------- SuperBill Details Patient Name: Date of Service: DO [name] 10/30/2019 Medical Record Number: [id number] Patient Account Number: [id number] Date of Birth/Sex: Treating RN: 04-08-1945 (74 y.o. Freddy Finner Primary Care Provider: MA Anselm Lis, [facility] NNO N Other Clinician: Referring Provider: Treating Provider/Extender: Baltazar Najjar MA SNERI, SHA NNO N Weeks in Treatment: 0 Diagnosis Coding ICD-10 Codes Code Description [phone] Non-pressure chronic ulcer of right thigh with fat layer exposed I89.0 Lymphedema, not elsewhere classified Facility Procedures CPT4 Code: [id number] Description: 99213 - WOUND  CARE VISIT-LEV 3 EST PT Modifier: 25 Quantity: 1 Physician Procedures : CPT4 Code Description Modifier [id number] WC PHYS LEVEL 3 NEW PT 25 ICD-10 Diagnosis Description 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

## 2019-11-08 DIAGNOSIS — M545 Low back pain: Secondary | ICD-10-CM | POA: Diagnosis not present

## 2019-11-08 DIAGNOSIS — Z01818 Encounter for other preprocedural examination: Secondary | ICD-10-CM | POA: Diagnosis not present

## 2019-11-08 DIAGNOSIS — M4036 Flatback syndrome, lumbar region: Secondary | ICD-10-CM | POA: Diagnosis not present

## 2019-11-08 DIAGNOSIS — Z01812 Encounter for preprocedural laboratory examination: Secondary | ICD-10-CM | POA: Diagnosis not present

## 2019-11-08 DIAGNOSIS — M5430 Sciatica, unspecified side: Secondary | ICD-10-CM | POA: Diagnosis not present

## 2019-11-08 DIAGNOSIS — T82111A Breakdown (mechanical) of cardiac pulse generator (battery), initial encounter: Secondary | ICD-10-CM | POA: Diagnosis not present

## 2019-11-08 DIAGNOSIS — M5137 Other intervertebral disc degeneration, lumbosacral region: Secondary | ICD-10-CM | POA: Diagnosis not present

## 2019-11-09 ENCOUNTER — Encounter (HOSPITAL_BASED_OUTPATIENT_CLINIC_OR_DEPARTMENT_OTHER): Payer: Medicare PPO | Admitting: Internal Medicine

## 2019-11-12 DIAGNOSIS — M4716 Other spondylosis with myelopathy, lumbar region: Secondary | ICD-10-CM | POA: Diagnosis not present

## 2019-11-12 DIAGNOSIS — M545 Low back pain, unspecified: Secondary | ICD-10-CM | POA: Diagnosis not present

## 2019-11-12 DIAGNOSIS — M961 Postlaminectomy syndrome, not elsewhere classified: Secondary | ICD-10-CM | POA: Diagnosis not present

## 2019-11-12 DIAGNOSIS — M4036 Flatback syndrome, lumbar region: Secondary | ICD-10-CM | POA: Diagnosis not present

## 2019-11-12 DIAGNOSIS — T85113A Breakdown (mechanical) of implanted electronic neurostimulator, generator, initial encounter: Secondary | ICD-10-CM | POA: Diagnosis not present

## 2019-11-12 DIAGNOSIS — G8929 Other chronic pain: Secondary | ICD-10-CM | POA: Diagnosis not present

## 2019-11-12 DIAGNOSIS — M810 Age-related osteoporosis without current pathological fracture: Secondary | ICD-10-CM | POA: Diagnosis not present

## 2019-11-12 DIAGNOSIS — M5137 Other intervertebral disc degeneration, lumbosacral region: Secondary | ICD-10-CM | POA: Diagnosis not present

## 2019-11-12 DIAGNOSIS — J449 Chronic obstructive pulmonary disease, unspecified: Secondary | ICD-10-CM | POA: Diagnosis not present

## 2019-11-12 DIAGNOSIS — T85113D Breakdown (mechanical) of implanted electronic neurostimulator, generator, subsequent encounter: Secondary | ICD-10-CM | POA: Diagnosis not present

## 2019-11-12 DIAGNOSIS — G894 Chronic pain syndrome: Secondary | ICD-10-CM | POA: Diagnosis not present

## 2019-11-12 DIAGNOSIS — M5430 Sciatica, unspecified side: Secondary | ICD-10-CM | POA: Diagnosis not present

## 2019-11-12 DIAGNOSIS — M792 Neuralgia and neuritis, unspecified: Secondary | ICD-10-CM | POA: Diagnosis not present

## 2019-11-13 ENCOUNTER — Encounter (HOSPITAL_BASED_OUTPATIENT_CLINIC_OR_DEPARTMENT_OTHER): Payer: Medicare PPO | Admitting: Internal Medicine

## 2019-11-20 ENCOUNTER — Encounter (HOSPITAL_BASED_OUTPATIENT_CLINIC_OR_DEPARTMENT_OTHER): Payer: Medicare PPO | Admitting: Internal Medicine

## 2019-11-22 DIAGNOSIS — M961 Postlaminectomy syndrome, not elsewhere classified: Secondary | ICD-10-CM | POA: Diagnosis not present

## 2019-12-11 DIAGNOSIS — M545 Low back pain, unspecified: Secondary | ICD-10-CM | POA: Diagnosis not present

## 2019-12-23 DIAGNOSIS — M961 Postlaminectomy syndrome, not elsewhere classified: Secondary | ICD-10-CM | POA: Diagnosis not present

## 2020-01-10 DIAGNOSIS — F1120 Opioid dependence, uncomplicated: Secondary | ICD-10-CM | POA: Diagnosis not present

## 2020-01-10 DIAGNOSIS — G894 Chronic pain syndrome: Secondary | ICD-10-CM | POA: Diagnosis not present

## 2020-01-10 DIAGNOSIS — M25572 Pain in left ankle and joints of left foot: Secondary | ICD-10-CM | POA: Diagnosis not present

## 2020-01-10 DIAGNOSIS — M542 Cervicalgia: Secondary | ICD-10-CM | POA: Diagnosis not present

## 2020-01-10 DIAGNOSIS — M25561 Pain in right knee: Secondary | ICD-10-CM | POA: Diagnosis not present

## 2020-01-10 DIAGNOSIS — M545 Low back pain, unspecified: Secondary | ICD-10-CM | POA: Diagnosis not present

## 2020-01-10 DIAGNOSIS — M25571 Pain in right ankle and joints of right foot: Secondary | ICD-10-CM | POA: Diagnosis not present

## 2020-01-10 DIAGNOSIS — M79604 Pain in right leg: Secondary | ICD-10-CM | POA: Diagnosis not present

## 2020-01-10 DIAGNOSIS — M25511 Pain in right shoulder: Secondary | ICD-10-CM | POA: Diagnosis not present

## 2020-01-10 DIAGNOSIS — Z79891 Long term (current) use of opiate analgesic: Secondary | ICD-10-CM | POA: Diagnosis not present

## 2020-01-10 DIAGNOSIS — G8929 Other chronic pain: Secondary | ICD-10-CM | POA: Diagnosis not present

## 2020-01-10 DIAGNOSIS — M25512 Pain in left shoulder: Secondary | ICD-10-CM | POA: Diagnosis not present

## 2020-01-17 DIAGNOSIS — D485 Neoplasm of uncertain behavior of skin: Secondary | ICD-10-CM | POA: Diagnosis not present

## 2020-01-17 DIAGNOSIS — C44529 Squamous cell carcinoma of skin of other part of trunk: Secondary | ICD-10-CM | POA: Diagnosis not present

## 2020-01-17 DIAGNOSIS — L304 Erythema intertrigo: Secondary | ICD-10-CM | POA: Diagnosis not present

## 2020-01-22 DIAGNOSIS — M961 Postlaminectomy syndrome, not elsewhere classified: Secondary | ICD-10-CM | POA: Diagnosis not present

## 2020-02-22 DIAGNOSIS — M961 Postlaminectomy syndrome, not elsewhere classified: Secondary | ICD-10-CM | POA: Diagnosis not present

## 2020-03-04 DIAGNOSIS — I890 Lymphedema, not elsewhere classified: Secondary | ICD-10-CM | POA: Diagnosis not present

## 2020-03-04 DIAGNOSIS — E7800 Pure hypercholesterolemia, unspecified: Secondary | ICD-10-CM | POA: Diagnosis not present

## 2020-03-04 DIAGNOSIS — E785 Hyperlipidemia, unspecified: Secondary | ICD-10-CM | POA: Diagnosis not present

## 2020-03-04 DIAGNOSIS — I10 Essential (primary) hypertension: Secondary | ICD-10-CM | POA: Diagnosis not present

## 2020-03-24 DIAGNOSIS — M961 Postlaminectomy syndrome, not elsewhere classified: Secondary | ICD-10-CM | POA: Diagnosis not present

## 2020-03-28 DIAGNOSIS — L814 Other melanin hyperpigmentation: Secondary | ICD-10-CM | POA: Diagnosis not present

## 2020-03-28 DIAGNOSIS — L0390 Cellulitis, unspecified: Secondary | ICD-10-CM | POA: Diagnosis not present

## 2020-03-28 DIAGNOSIS — C44722 Squamous cell carcinoma of skin of right lower limb, including hip: Secondary | ICD-10-CM | POA: Diagnosis not present

## 2020-04-02 DIAGNOSIS — M5137 Other intervertebral disc degeneration, lumbosacral region: Secondary | ICD-10-CM | POA: Diagnosis not present

## 2020-04-02 DIAGNOSIS — M4716 Other spondylosis with myelopathy, lumbar region: Secondary | ICD-10-CM | POA: Diagnosis not present

## 2020-04-05 IMAGING — US US EXTREM LOW VENOUS BILAT
1 series · 13 of 24 positions shown · non-contrast
Comparison: 04/11/2015

CLINICAL DATA: Bilateral lower extremity edema. Remote history of
DVT. Recent treatment for left leg cellulitis.

EXAM:
BILATERAL LOWER EXTREMITY VENOUS DUPLEX ULTRASOUND
TECHNIQUE: Gray-scale sonography with graded compression, as well as color
Doppler and duplex ultrasound, were performed to evaluate the deep
and superficial veins of both lower extremities. Spectral Doppler
was utilized to evaluate flow at rest and with distal augmentation
maneuvers. A complete superficial venous insufficiency exam was
performed in the upright standing. I personally performed the
technical portion of the superficial exam.

[Series 1: us extrem low venous bilat · 0.08mm/px · 13 of 55 slices shown]
[im 1/55]
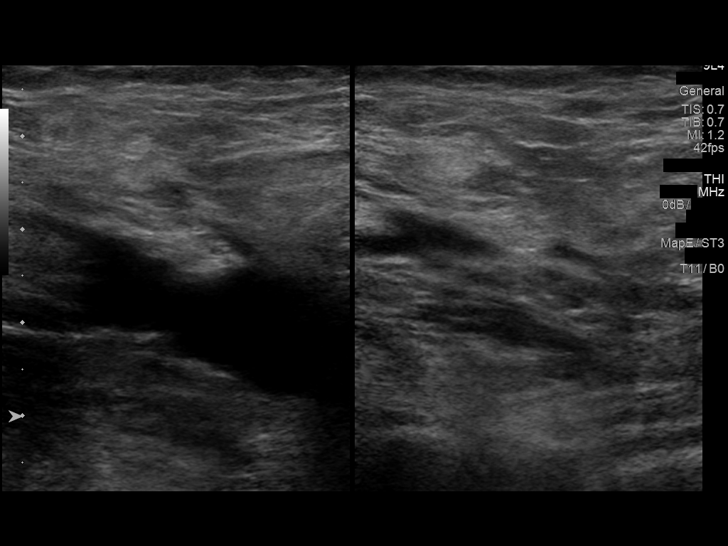
[im 5/55]
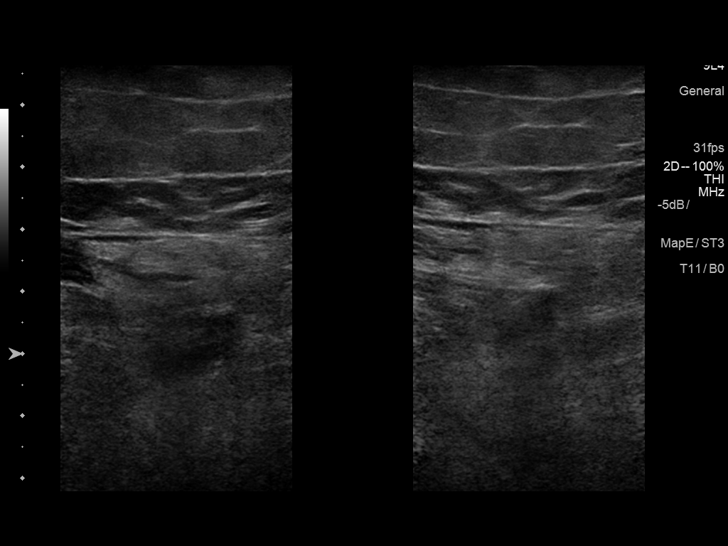
[im 10/55]
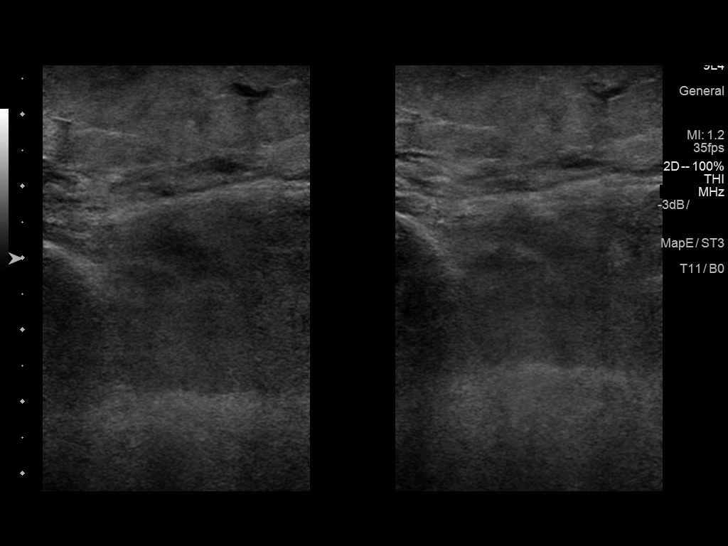
[im 15/55]
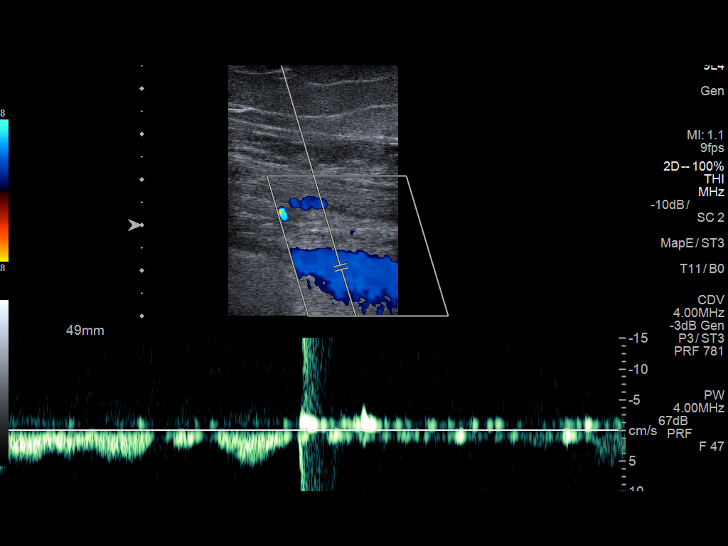
[im 19/55]
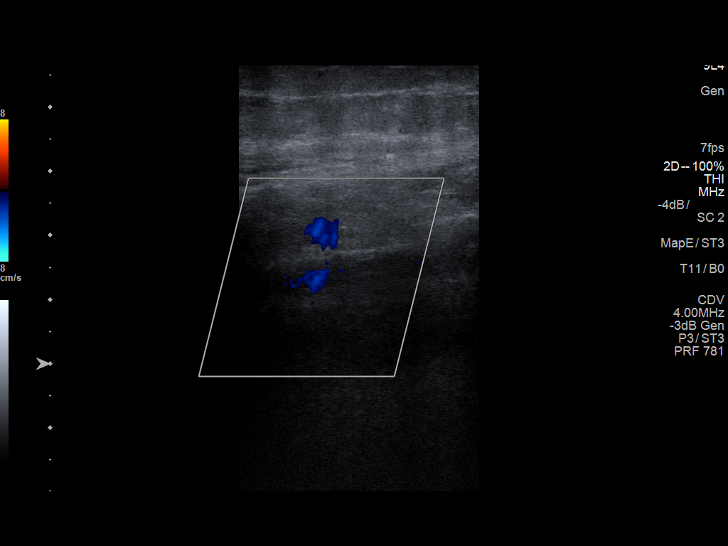
[im 24/55]
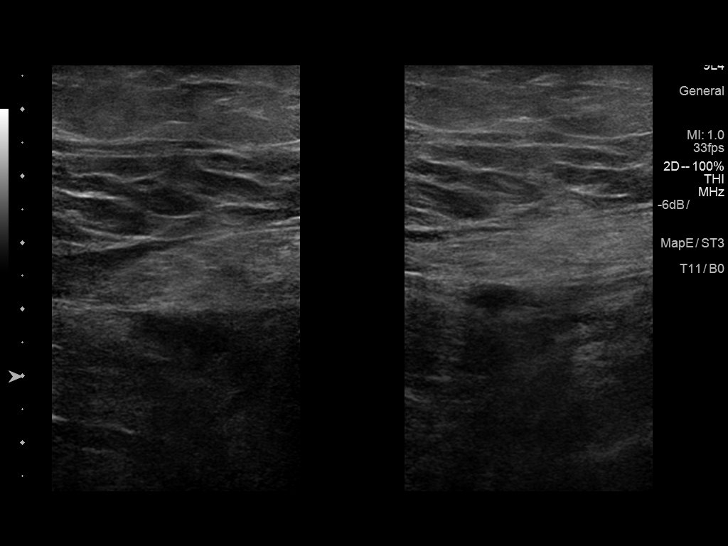
[im 29/55]
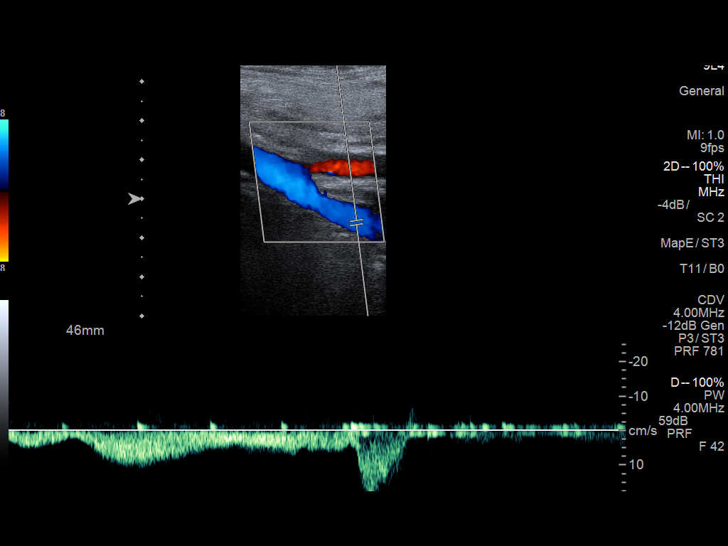
[im 31/55]
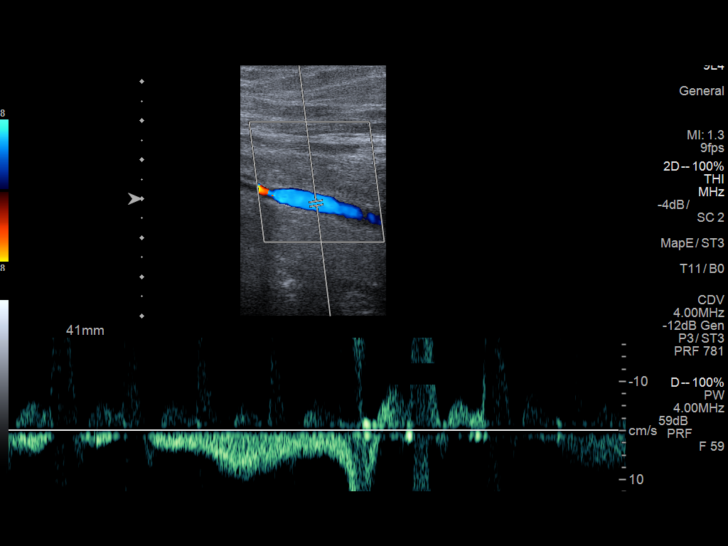
[im 36/55]
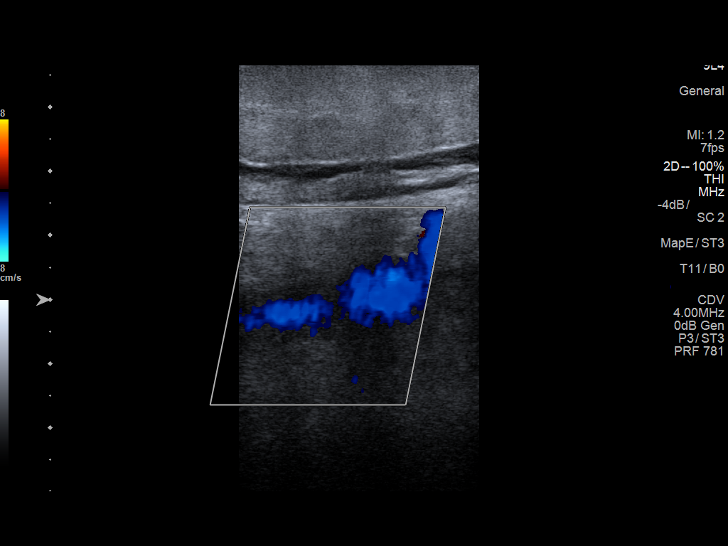
[im 40/55]
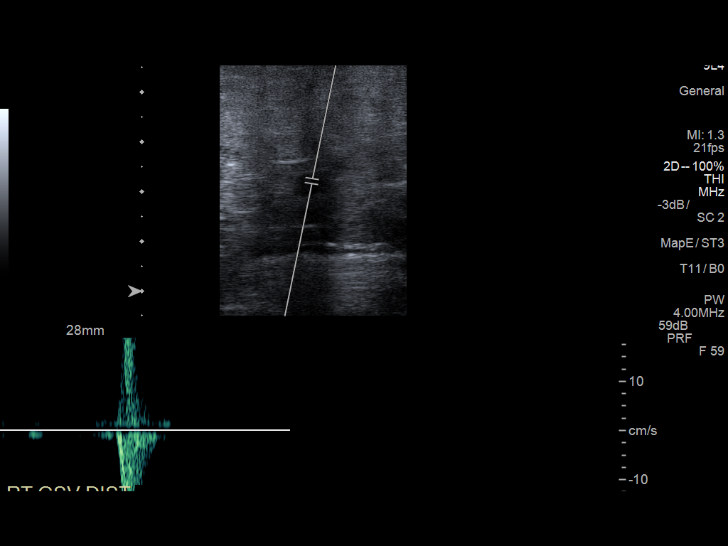
[im 45/55]
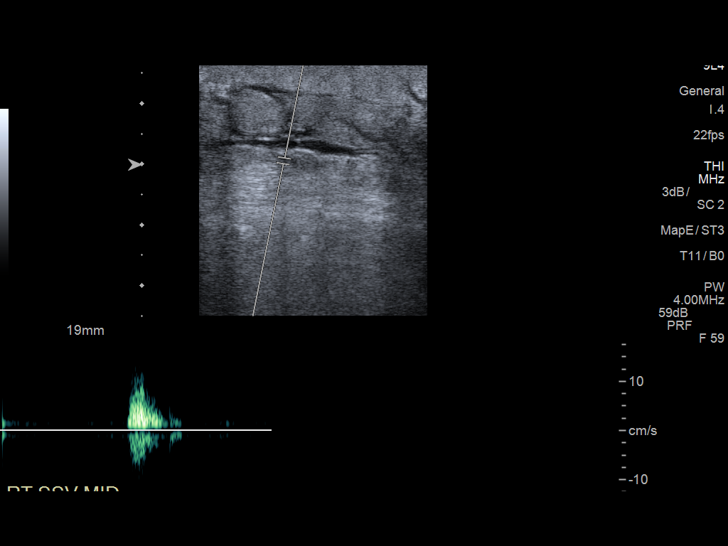
[im 50/55]
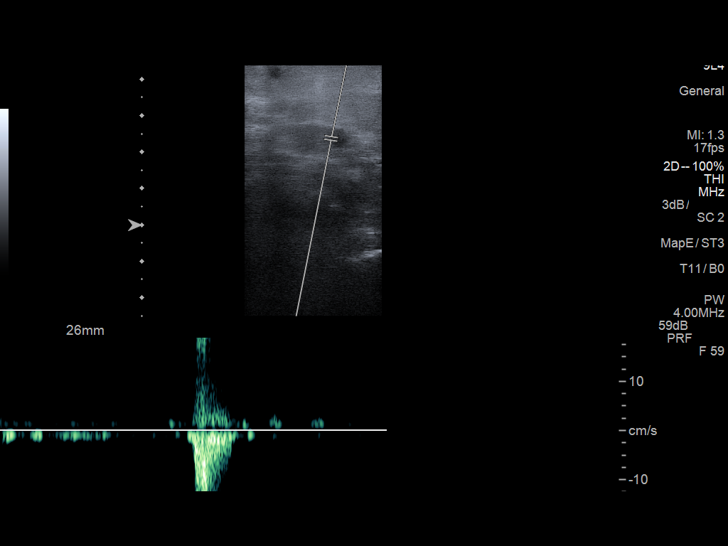
[im 55/55]
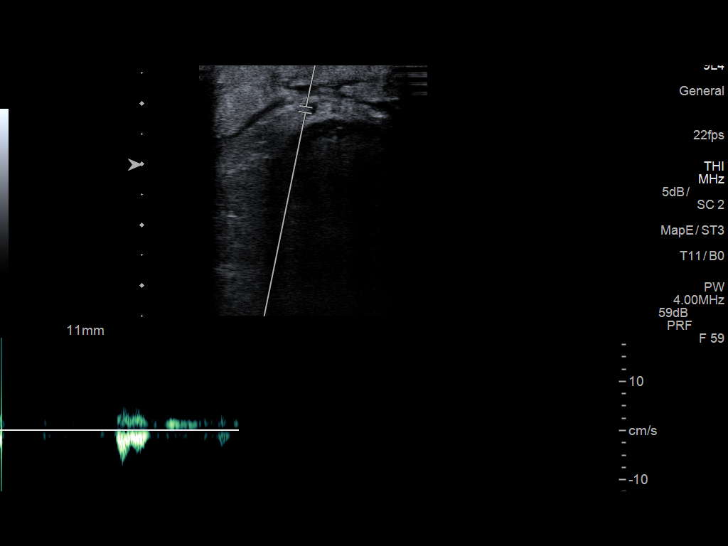

[13 of 24 positions shown; findings below may reference images not displayed]

FINDINGS: RIGHT Deep Venous System:

Evaluation of the deep venous system including the common femoral,
femoral, profunda femoral, popliteal and calf veins (where visible)
demonstrate no evidence of deep venous thrombosis. The vessels are
compressible and demonstrate normal respiratory phasicity and
response to augmentation. No evidence of significant deep venous
reflux.

RIGHT Superficial Venous System:

SFJ: Patent, normal in caliber, no reflux post augmentation

GSV Prox Thigh: Negative

GSV Mid Thigh: Negative

GSV Lower Thigh: Negative

GSV Prox Calf: Negative

GSV Mid Calf: Negative

GSV Distal Calf: Negative

SPJ: Not visualized

SSV Prox: Patent, diminutive, no reflux

SSV Mid: Negative

SSV Distal: Negative

Other: Mild subcutaneous edema in the calf.

LEFT Deep Venous System:

Evaluation of the deep venous system including the common femoral,
femoral, profunda femoral, popliteal and calf veins (where visible)
demonstrate no evidence of deep venous thrombosis. The vessels are
compressible and demonstrate normal respiratory phasicity and
response to augmentation. No evidence of deep venous reflux.

LEFT Superficial Venous System:

SFJ: Patent, normal in caliber, no reflux post augmentation

GSV Prox Thigh: Negative

GSV Mid Thigh: Negative

GSV Lower Thigh: Negative

GSV Prox Calf: Negative

GSV Mid Calf: Not visualized

GSV Distal Calf: Not visualized

SPJ: Not visualized

SSV Prox: Patent, normal in caliber, no reflux

SSV Mid: Negative

SSV Distal: Negative

Other: Moderate subcutaneous edema in the calf.
IMPRESSION: 1. Negative for lower extremity DVT.
2. No superficial venous valvular incompetence or reflux in either
lower extremity.

## 2020-04-10 DIAGNOSIS — M25571 Pain in right ankle and joints of right foot: Secondary | ICD-10-CM | POA: Diagnosis not present

## 2020-04-10 DIAGNOSIS — R202 Paresthesia of skin: Secondary | ICD-10-CM | POA: Diagnosis not present

## 2020-04-10 DIAGNOSIS — F1120 Opioid dependence, uncomplicated: Secondary | ICD-10-CM | POA: Diagnosis not present

## 2020-04-10 DIAGNOSIS — M545 Low back pain, unspecified: Secondary | ICD-10-CM | POA: Diagnosis not present

## 2020-04-10 DIAGNOSIS — G894 Chronic pain syndrome: Secondary | ICD-10-CM | POA: Diagnosis not present

## 2020-04-10 DIAGNOSIS — M25511 Pain in right shoulder: Secondary | ICD-10-CM | POA: Diagnosis not present

## 2020-04-10 DIAGNOSIS — M25512 Pain in left shoulder: Secondary | ICD-10-CM | POA: Diagnosis not present

## 2020-04-10 DIAGNOSIS — G8929 Other chronic pain: Secondary | ICD-10-CM | POA: Diagnosis not present

## 2020-04-10 DIAGNOSIS — Z79891 Long term (current) use of opiate analgesic: Secondary | ICD-10-CM | POA: Diagnosis not present

## 2020-04-10 DIAGNOSIS — M542 Cervicalgia: Secondary | ICD-10-CM | POA: Diagnosis not present

## 2020-04-10 DIAGNOSIS — M25572 Pain in left ankle and joints of left foot: Secondary | ICD-10-CM | POA: Diagnosis not present

## 2020-04-10 DIAGNOSIS — M79604 Pain in right leg: Secondary | ICD-10-CM | POA: Diagnosis not present

## 2020-04-14 DIAGNOSIS — C44722 Squamous cell carcinoma of skin of right lower limb, including hip: Secondary | ICD-10-CM | POA: Diagnosis not present

## 2020-04-21 DIAGNOSIS — M961 Postlaminectomy syndrome, not elsewhere classified: Secondary | ICD-10-CM | POA: Diagnosis not present

## 2020-05-10 DIAGNOSIS — G894 Chronic pain syndrome: Secondary | ICD-10-CM | POA: Diagnosis not present

## 2020-05-22 DIAGNOSIS — M961 Postlaminectomy syndrome, not elsewhere classified: Secondary | ICD-10-CM | POA: Diagnosis not present

## 2020-06-02 DIAGNOSIS — E7800 Pure hypercholesterolemia, unspecified: Secondary | ICD-10-CM | POA: Diagnosis not present

## 2020-06-02 DIAGNOSIS — I10 Essential (primary) hypertension: Secondary | ICD-10-CM | POA: Diagnosis not present

## 2020-06-03 DIAGNOSIS — M25512 Pain in left shoulder: Secondary | ICD-10-CM | POA: Diagnosis not present

## 2020-06-03 DIAGNOSIS — M4802 Spinal stenosis, cervical region: Secondary | ICD-10-CM | POA: Diagnosis not present

## 2020-06-09 DIAGNOSIS — G894 Chronic pain syndrome: Secondary | ICD-10-CM | POA: Diagnosis not present

## 2020-06-12 DIAGNOSIS — M170 Bilateral primary osteoarthritis of knee: Secondary | ICD-10-CM | POA: Diagnosis not present

## 2020-07-03 DIAGNOSIS — B351 Tinea unguium: Secondary | ICD-10-CM | POA: Diagnosis not present

## 2020-07-09 DIAGNOSIS — G894 Chronic pain syndrome: Secondary | ICD-10-CM | POA: Diagnosis not present

## 2020-07-10 DIAGNOSIS — M25571 Pain in right ankle and joints of right foot: Secondary | ICD-10-CM | POA: Diagnosis not present

## 2020-07-10 DIAGNOSIS — Z79891 Long term (current) use of opiate analgesic: Secondary | ICD-10-CM | POA: Diagnosis not present

## 2020-07-10 DIAGNOSIS — M25511 Pain in right shoulder: Secondary | ICD-10-CM | POA: Diagnosis not present

## 2020-07-10 DIAGNOSIS — M25512 Pain in left shoulder: Secondary | ICD-10-CM | POA: Diagnosis not present

## 2020-07-10 DIAGNOSIS — R202 Paresthesia of skin: Secondary | ICD-10-CM | POA: Diagnosis not present

## 2020-07-10 DIAGNOSIS — M545 Low back pain, unspecified: Secondary | ICD-10-CM | POA: Diagnosis not present

## 2020-07-10 DIAGNOSIS — M25572 Pain in left ankle and joints of left foot: Secondary | ICD-10-CM | POA: Diagnosis not present

## 2020-07-10 DIAGNOSIS — G894 Chronic pain syndrome: Secondary | ICD-10-CM | POA: Diagnosis not present

## 2020-07-10 DIAGNOSIS — M79604 Pain in right leg: Secondary | ICD-10-CM | POA: Diagnosis not present

## 2020-07-10 DIAGNOSIS — G8929 Other chronic pain: Secondary | ICD-10-CM | POA: Diagnosis not present

## 2020-07-10 DIAGNOSIS — M542 Cervicalgia: Secondary | ICD-10-CM | POA: Diagnosis not present

## 2020-07-10 DIAGNOSIS — F1120 Opioid dependence, uncomplicated: Secondary | ICD-10-CM | POA: Diagnosis not present

## 2020-07-28 DIAGNOSIS — L820 Inflamed seborrheic keratosis: Secondary | ICD-10-CM | POA: Diagnosis not present

## 2020-07-28 DIAGNOSIS — L209 Atopic dermatitis, unspecified: Secondary | ICD-10-CM | POA: Diagnosis not present

## 2020-07-28 DIAGNOSIS — B351 Tinea unguium: Secondary | ICD-10-CM | POA: Diagnosis not present

## 2020-08-08 DIAGNOSIS — G894 Chronic pain syndrome: Secondary | ICD-10-CM | POA: Diagnosis not present

## 2020-09-08 DIAGNOSIS — G894 Chronic pain syndrome: Secondary | ICD-10-CM | POA: Diagnosis not present

## 2020-09-08 DIAGNOSIS — B351 Tinea unguium: Secondary | ICD-10-CM | POA: Diagnosis not present

## 2020-09-08 DIAGNOSIS — C44722 Squamous cell carcinoma of skin of right lower limb, including hip: Secondary | ICD-10-CM | POA: Diagnosis not present

## 2020-09-08 DIAGNOSIS — L820 Inflamed seborrheic keratosis: Secondary | ICD-10-CM | POA: Diagnosis not present

## 2020-10-09 DIAGNOSIS — G894 Chronic pain syndrome: Secondary | ICD-10-CM | POA: Diagnosis not present

## 2020-10-09 DIAGNOSIS — Z79891 Long term (current) use of opiate analgesic: Secondary | ICD-10-CM | POA: Diagnosis not present

## 2020-10-09 DIAGNOSIS — M545 Low back pain, unspecified: Secondary | ICD-10-CM | POA: Diagnosis not present

## 2020-10-09 DIAGNOSIS — M25511 Pain in right shoulder: Secondary | ICD-10-CM | POA: Diagnosis not present

## 2020-10-09 DIAGNOSIS — M79604 Pain in right leg: Secondary | ICD-10-CM | POA: Diagnosis not present

## 2020-10-09 DIAGNOSIS — M25512 Pain in left shoulder: Secondary | ICD-10-CM | POA: Diagnosis not present

## 2020-10-09 DIAGNOSIS — M542 Cervicalgia: Secondary | ICD-10-CM | POA: Diagnosis not present

## 2020-10-09 DIAGNOSIS — M25561 Pain in right knee: Secondary | ICD-10-CM | POA: Diagnosis not present

## 2020-10-09 DIAGNOSIS — M25572 Pain in left ankle and joints of left foot: Secondary | ICD-10-CM | POA: Diagnosis not present

## 2020-10-09 DIAGNOSIS — M25571 Pain in right ankle and joints of right foot: Secondary | ICD-10-CM | POA: Diagnosis not present

## 2020-10-09 DIAGNOSIS — R202 Paresthesia of skin: Secondary | ICD-10-CM | POA: Diagnosis not present

## 2020-12-02 DIAGNOSIS — M170 Bilateral primary osteoarthritis of knee: Secondary | ICD-10-CM | POA: Diagnosis not present

## 2020-12-23 DIAGNOSIS — Z780 Asymptomatic menopausal state: Secondary | ICD-10-CM | POA: Diagnosis not present

## 2020-12-23 DIAGNOSIS — Z1231 Encounter for screening mammogram for malignant neoplasm of breast: Secondary | ICD-10-CM | POA: Diagnosis not present

## 2020-12-23 DIAGNOSIS — M8589 Other specified disorders of bone density and structure, multiple sites: Secondary | ICD-10-CM | POA: Diagnosis not present

## 2020-12-23 DIAGNOSIS — M810 Age-related osteoporosis without current pathological fracture: Secondary | ICD-10-CM | POA: Diagnosis not present

## 2021-01-06 DIAGNOSIS — G894 Chronic pain syndrome: Secondary | ICD-10-CM | POA: Diagnosis not present

## 2021-01-06 DIAGNOSIS — M545 Low back pain, unspecified: Secondary | ICD-10-CM | POA: Diagnosis not present

## 2021-01-06 DIAGNOSIS — M25511 Pain in right shoulder: Secondary | ICD-10-CM | POA: Diagnosis not present

## 2021-01-06 DIAGNOSIS — Z79891 Long term (current) use of opiate analgesic: Secondary | ICD-10-CM | POA: Diagnosis not present

## 2021-01-06 DIAGNOSIS — R202 Paresthesia of skin: Secondary | ICD-10-CM | POA: Diagnosis not present

## 2021-01-06 DIAGNOSIS — F1120 Opioid dependence, uncomplicated: Secondary | ICD-10-CM | POA: Diagnosis not present

## 2021-01-06 DIAGNOSIS — M25512 Pain in left shoulder: Secondary | ICD-10-CM | POA: Diagnosis not present

## 2021-01-06 DIAGNOSIS — M79604 Pain in right leg: Secondary | ICD-10-CM | POA: Diagnosis not present

## 2021-01-06 DIAGNOSIS — M25572 Pain in left ankle and joints of left foot: Secondary | ICD-10-CM | POA: Diagnosis not present

## 2021-01-06 DIAGNOSIS — M542 Cervicalgia: Secondary | ICD-10-CM | POA: Diagnosis not present

## 2021-01-06 DIAGNOSIS — M25571 Pain in right ankle and joints of right foot: Secondary | ICD-10-CM | POA: Diagnosis not present

## 2021-02-09 DIAGNOSIS — H5213 Myopia, bilateral: Secondary | ICD-10-CM | POA: Diagnosis not present

## 2021-02-09 DIAGNOSIS — H532 Diplopia: Secondary | ICD-10-CM | POA: Diagnosis not present

## 2021-02-09 DIAGNOSIS — H02403 Unspecified ptosis of bilateral eyelids: Secondary | ICD-10-CM | POA: Diagnosis not present

## 2021-02-17 DIAGNOSIS — H6123 Impacted cerumen, bilateral: Secondary | ICD-10-CM | POA: Diagnosis not present

## 2021-02-17 DIAGNOSIS — Z13220 Encounter for screening for lipoid disorders: Secondary | ICD-10-CM | POA: Diagnosis not present

## 2021-02-17 DIAGNOSIS — Z0000 Encounter for general adult medical examination without abnormal findings: Secondary | ICD-10-CM | POA: Diagnosis not present

## 2021-02-17 DIAGNOSIS — Z136 Encounter for screening for cardiovascular disorders: Secondary | ICD-10-CM | POA: Diagnosis not present

## 2021-02-17 DIAGNOSIS — Z131 Encounter for screening for diabetes mellitus: Secondary | ICD-10-CM | POA: Diagnosis not present

## 2021-03-31 DIAGNOSIS — F1120 Opioid dependence, uncomplicated: Secondary | ICD-10-CM | POA: Diagnosis not present

## 2021-03-31 DIAGNOSIS — Z79891 Long term (current) use of opiate analgesic: Secondary | ICD-10-CM | POA: Diagnosis not present

## 2021-03-31 DIAGNOSIS — M79604 Pain in right leg: Secondary | ICD-10-CM | POA: Diagnosis not present

## 2021-03-31 DIAGNOSIS — M25512 Pain in left shoulder: Secondary | ICD-10-CM | POA: Diagnosis not present

## 2021-03-31 DIAGNOSIS — M25511 Pain in right shoulder: Secondary | ICD-10-CM | POA: Diagnosis not present

## 2021-03-31 DIAGNOSIS — M542 Cervicalgia: Secondary | ICD-10-CM | POA: Diagnosis not present

## 2021-03-31 DIAGNOSIS — G894 Chronic pain syndrome: Secondary | ICD-10-CM | POA: Diagnosis not present

## 2021-03-31 DIAGNOSIS — M25572 Pain in left ankle and joints of left foot: Secondary | ICD-10-CM | POA: Diagnosis not present

## 2021-03-31 DIAGNOSIS — M545 Low back pain, unspecified: Secondary | ICD-10-CM | POA: Diagnosis not present

## 2021-03-31 DIAGNOSIS — G8929 Other chronic pain: Secondary | ICD-10-CM | POA: Diagnosis not present

## 2021-03-31 DIAGNOSIS — M25571 Pain in right ankle and joints of right foot: Secondary | ICD-10-CM | POA: Diagnosis not present

## 2021-04-15 DIAGNOSIS — Z6841 Body Mass Index (BMI) 40.0 and over, adult: Secondary | ICD-10-CM | POA: Diagnosis not present

## 2021-04-15 DIAGNOSIS — N183 Chronic kidney disease, stage 3 unspecified: Secondary | ICD-10-CM | POA: Diagnosis not present

## 2021-04-15 DIAGNOSIS — E7800 Pure hypercholesterolemia, unspecified: Secondary | ICD-10-CM | POA: Diagnosis not present

## 2021-05-17 DIAGNOSIS — L03116 Cellulitis of left lower limb: Secondary | ICD-10-CM | POA: Diagnosis not present

## 2021-06-03 DIAGNOSIS — Z6841 Body Mass Index (BMI) 40.0 and over, adult: Secondary | ICD-10-CM | POA: Diagnosis not present

## 2021-07-14 DIAGNOSIS — M25561 Pain in right knee: Secondary | ICD-10-CM | POA: Diagnosis not present

## 2021-07-14 DIAGNOSIS — M25511 Pain in right shoulder: Secondary | ICD-10-CM | POA: Diagnosis not present

## 2021-07-14 DIAGNOSIS — M25512 Pain in left shoulder: Secondary | ICD-10-CM | POA: Diagnosis not present

## 2021-07-14 DIAGNOSIS — M545 Low back pain, unspecified: Secondary | ICD-10-CM | POA: Diagnosis not present

## 2021-07-14 DIAGNOSIS — M79604 Pain in right leg: Secondary | ICD-10-CM | POA: Diagnosis not present

## 2021-07-14 DIAGNOSIS — G894 Chronic pain syndrome: Secondary | ICD-10-CM | POA: Diagnosis not present

## 2021-07-14 DIAGNOSIS — Z79891 Long term (current) use of opiate analgesic: Secondary | ICD-10-CM | POA: Diagnosis not present

## 2021-07-14 DIAGNOSIS — M25572 Pain in left ankle and joints of left foot: Secondary | ICD-10-CM | POA: Diagnosis not present

## 2021-07-14 DIAGNOSIS — R202 Paresthesia of skin: Secondary | ICD-10-CM | POA: Diagnosis not present

## 2021-07-14 DIAGNOSIS — M542 Cervicalgia: Secondary | ICD-10-CM | POA: Diagnosis not present

## 2021-07-14 DIAGNOSIS — M25571 Pain in right ankle and joints of right foot: Secondary | ICD-10-CM | POA: Diagnosis not present

## 2021-07-16 DIAGNOSIS — H02413 Mechanical ptosis of bilateral eyelids: Secondary | ICD-10-CM | POA: Diagnosis not present

## 2021-07-16 DIAGNOSIS — H02831 Dermatochalasis of right upper eyelid: Secondary | ICD-10-CM | POA: Diagnosis not present

## 2021-07-16 DIAGNOSIS — H02423 Myogenic ptosis of bilateral eyelids: Secondary | ICD-10-CM | POA: Diagnosis not present

## 2021-07-16 DIAGNOSIS — H02834 Dermatochalasis of left upper eyelid: Secondary | ICD-10-CM | POA: Diagnosis not present

## 2021-07-16 DIAGNOSIS — H0279 Other degenerative disorders of eyelid and periocular area: Secondary | ICD-10-CM | POA: Diagnosis not present

## 2021-07-29 DIAGNOSIS — D509 Iron deficiency anemia, unspecified: Secondary | ICD-10-CM | POA: Diagnosis not present

## 2021-07-29 DIAGNOSIS — N183 Chronic kidney disease, stage 3 unspecified: Secondary | ICD-10-CM | POA: Diagnosis not present

## 2021-07-29 DIAGNOSIS — E559 Vitamin D deficiency, unspecified: Secondary | ICD-10-CM | POA: Diagnosis not present

## 2021-07-29 DIAGNOSIS — R0602 Shortness of breath: Secondary | ICD-10-CM | POA: Diagnosis not present

## 2021-07-29 DIAGNOSIS — I129 Hypertensive chronic kidney disease with stage 1 through stage 4 chronic kidney disease, or unspecified chronic kidney disease: Secondary | ICD-10-CM | POA: Diagnosis not present

## 2021-07-29 DIAGNOSIS — Z6841 Body Mass Index (BMI) 40.0 and over, adult: Secondary | ICD-10-CM | POA: Diagnosis not present

## 2021-07-29 DIAGNOSIS — R5383 Other fatigue: Secondary | ICD-10-CM | POA: Diagnosis not present

## 2021-07-29 DIAGNOSIS — E7849 Other hyperlipidemia: Secondary | ICD-10-CM | POA: Diagnosis not present

## 2021-08-12 DIAGNOSIS — E785 Hyperlipidemia, unspecified: Secondary | ICD-10-CM | POA: Diagnosis not present

## 2021-08-12 DIAGNOSIS — M797 Fibromyalgia: Secondary | ICD-10-CM | POA: Diagnosis not present

## 2021-08-12 DIAGNOSIS — M6259 Muscle wasting and atrophy, not elsewhere classified, multiple sites: Secondary | ICD-10-CM | POA: Diagnosis not present

## 2021-08-12 DIAGNOSIS — D509 Iron deficiency anemia, unspecified: Secondary | ICD-10-CM | POA: Diagnosis not present

## 2021-08-12 DIAGNOSIS — N183 Chronic kidney disease, stage 3 unspecified: Secondary | ICD-10-CM | POA: Diagnosis not present

## 2021-08-12 DIAGNOSIS — E8881 Metabolic syndrome: Secondary | ICD-10-CM | POA: Diagnosis not present

## 2021-08-12 DIAGNOSIS — E559 Vitamin D deficiency, unspecified: Secondary | ICD-10-CM | POA: Diagnosis not present

## 2021-08-12 DIAGNOSIS — I890 Lymphedema, not elsewhere classified: Secondary | ICD-10-CM | POA: Diagnosis not present

## 2021-08-12 DIAGNOSIS — K219 Gastro-esophageal reflux disease without esophagitis: Secondary | ICD-10-CM | POA: Diagnosis not present

## 2021-08-13 DIAGNOSIS — K219 Gastro-esophageal reflux disease without esophagitis: Secondary | ICD-10-CM | POA: Diagnosis not present

## 2021-08-13 DIAGNOSIS — K5904 Chronic idiopathic constipation: Secondary | ICD-10-CM | POA: Diagnosis not present

## 2021-08-13 DIAGNOSIS — Z1211 Encounter for screening for malignant neoplasm of colon: Secondary | ICD-10-CM | POA: Diagnosis not present

## 2021-08-19 DIAGNOSIS — H53483 Generalized contraction of visual field, bilateral: Secondary | ICD-10-CM | POA: Diagnosis not present

## 2021-08-24 DIAGNOSIS — N1831 Chronic kidney disease, stage 3a: Secondary | ICD-10-CM | POA: Diagnosis not present

## 2021-08-24 DIAGNOSIS — D509 Iron deficiency anemia, unspecified: Secondary | ICD-10-CM | POA: Diagnosis not present

## 2021-08-24 DIAGNOSIS — E8881 Metabolic syndrome: Secondary | ICD-10-CM | POA: Diagnosis not present

## 2021-08-24 DIAGNOSIS — E559 Vitamin D deficiency, unspecified: Secondary | ICD-10-CM | POA: Diagnosis not present

## 2021-08-24 DIAGNOSIS — K219 Gastro-esophageal reflux disease without esophagitis: Secondary | ICD-10-CM | POA: Diagnosis not present

## 2021-08-24 DIAGNOSIS — E785 Hyperlipidemia, unspecified: Secondary | ICD-10-CM | POA: Diagnosis not present

## 2021-08-24 DIAGNOSIS — M797 Fibromyalgia: Secondary | ICD-10-CM | POA: Diagnosis not present

## 2021-08-24 DIAGNOSIS — M6259 Muscle wasting and atrophy, not elsewhere classified, multiple sites: Secondary | ICD-10-CM | POA: Diagnosis not present

## 2021-08-24 DIAGNOSIS — I890 Lymphedema, not elsewhere classified: Secondary | ICD-10-CM | POA: Diagnosis not present

## 2021-08-26 DIAGNOSIS — I10 Essential (primary) hypertension: Secondary | ICD-10-CM | POA: Diagnosis not present

## 2021-08-26 DIAGNOSIS — E785 Hyperlipidemia, unspecified: Secondary | ICD-10-CM | POA: Diagnosis not present

## 2021-08-26 DIAGNOSIS — G720 Drug-induced myopathy: Secondary | ICD-10-CM | POA: Diagnosis not present

## 2021-08-26 DIAGNOSIS — D508 Other iron deficiency anemias: Secondary | ICD-10-CM | POA: Diagnosis not present

## 2021-08-26 DIAGNOSIS — L8990 Pressure ulcer of unspecified site, unspecified stage: Secondary | ICD-10-CM | POA: Diagnosis not present

## 2021-08-26 DIAGNOSIS — Z23 Encounter for immunization: Secondary | ICD-10-CM | POA: Diagnosis not present

## 2021-08-26 DIAGNOSIS — Z6841 Body Mass Index (BMI) 40.0 and over, adult: Secondary | ICD-10-CM | POA: Diagnosis not present

## 2021-09-07 ENCOUNTER — Telehealth: Payer: Self-pay | Admitting: Medical Oncology

## 2021-09-07 NOTE — Telephone Encounter (Signed)
Pt would like to schedule an iron infusion.Last visit was 2021.

## 2021-09-08 ENCOUNTER — Telehealth: Payer: Self-pay | Admitting: Internal Medicine

## 2021-09-08 ENCOUNTER — Other Ambulatory Visit: Payer: Self-pay | Admitting: Medical Oncology

## 2021-09-08 ENCOUNTER — Encounter: Payer: Self-pay | Admitting: Medical Oncology

## 2021-09-08 DIAGNOSIS — K219 Gastro-esophageal reflux disease without esophagitis: Secondary | ICD-10-CM | POA: Diagnosis not present

## 2021-09-08 DIAGNOSIS — I10 Essential (primary) hypertension: Secondary | ICD-10-CM | POA: Diagnosis not present

## 2021-09-08 DIAGNOSIS — M797 Fibromyalgia: Secondary | ICD-10-CM | POA: Diagnosis not present

## 2021-09-08 DIAGNOSIS — E559 Vitamin D deficiency, unspecified: Secondary | ICD-10-CM | POA: Diagnosis not present

## 2021-09-08 DIAGNOSIS — D508 Other iron deficiency anemias: Secondary | ICD-10-CM | POA: Diagnosis not present

## 2021-09-08 DIAGNOSIS — E785 Hyperlipidemia, unspecified: Secondary | ICD-10-CM | POA: Diagnosis not present

## 2021-09-08 DIAGNOSIS — N1831 Chronic kidney disease, stage 3a: Secondary | ICD-10-CM | POA: Diagnosis not present

## 2021-09-08 DIAGNOSIS — M6259 Muscle wasting and atrophy, not elsewhere classified, multiple sites: Secondary | ICD-10-CM | POA: Diagnosis not present

## 2021-09-08 DIAGNOSIS — E8881 Metabolic syndrome: Secondary | ICD-10-CM | POA: Diagnosis not present

## 2021-09-08 NOTE — Telephone Encounter (Signed)
Scheduled per 8/1 in basket, message has been left with pt 

## 2021-09-22 ENCOUNTER — Other Ambulatory Visit: Payer: Self-pay

## 2021-09-22 ENCOUNTER — Inpatient Hospital Stay: Payer: Medicare PPO | Admitting: Internal Medicine

## 2021-09-22 ENCOUNTER — Encounter: Payer: Self-pay | Admitting: Internal Medicine

## 2021-09-22 ENCOUNTER — Inpatient Hospital Stay: Payer: Medicare PPO | Attending: Internal Medicine

## 2021-09-22 VITALS — BP 135/89 | HR 74 | Temp 97.9°F | Resp 16 | Ht 61.0 in | Wt 248.7 lb

## 2021-09-22 DIAGNOSIS — D509 Iron deficiency anemia, unspecified: Secondary | ICD-10-CM

## 2021-09-22 DIAGNOSIS — D508 Other iron deficiency anemias: Secondary | ICD-10-CM | POA: Diagnosis not present

## 2021-09-22 LAB — CBC WITH DIFFERENTIAL (CANCER CENTER ONLY)
Abs Immature Granulocytes: 0.06 10*3/uL (ref 0.00–0.07)
Basophils Absolute: 0.1 10*3/uL (ref 0.0–0.1)
Basophils Relative: 1 %
Eosinophils Absolute: 0.4 10*3/uL (ref 0.0–0.5)
Eosinophils Relative: 4 %
HCT: 43.7 % (ref 36.0–46.0)
Hemoglobin: 14.6 g/dL (ref 12.0–15.0)
Immature Granulocytes: 1 %
Lymphocytes Relative: 22 %
Lymphs Abs: 2.1 10*3/uL (ref 0.7–4.0)
MCH: 27.1 pg (ref 26.0–34.0)
MCHC: 33.4 g/dL (ref 30.0–36.0)
MCV: 81.2 fL (ref 80.0–100.0)
Monocytes Absolute: 0.6 10*3/uL (ref 0.1–1.0)
Monocytes Relative: 7 %
Neutro Abs: 6.2 10*3/uL (ref 1.7–7.7)
Neutrophils Relative %: 65 %
Platelet Count: 322 10*3/uL (ref 150–400)
RBC: 5.38 MIL/uL — ABNORMAL HIGH (ref 3.87–5.11)
RDW: 15.7 % — ABNORMAL HIGH (ref 11.5–15.5)
WBC Count: 9.5 10*3/uL (ref 4.0–10.5)
nRBC: 0 % (ref 0.0–0.2)

## 2021-09-22 LAB — IRON AND IRON BINDING CAPACITY (CC-WL,HP ONLY)
Iron: 84 ug/dL (ref 28–170)
Saturation Ratios: 17 % (ref 10.4–31.8)
TIBC: 486 ug/dL — ABNORMAL HIGH (ref 250–450)
UIBC: 402 ug/dL (ref 148–442)

## 2021-09-22 LAB — FERRITIN: Ferritin: 19 ng/mL (ref 11–307)

## 2021-09-22 NOTE — Progress Notes (Signed)
[facility] [phone]   [phone]  OFFICE PROGRESS NOTE  Koren Shiver, DO [address] [place] [address]  DIAGNOSIS: Persistent microcytic anemia secondary to iron deficiency of unclear etiology probably secondary to poor intake.  PRIOR THERAPY: Feraheme infusion 510 mg IV weekly for 2 doses.  First dose April 15, 2017.  CURRENT THERAPY: Observation.  INTERVAL HISTORY: [name] 76 y.o. female returns to the clinic today for follow-up visit.  The patient was last seen in July 2021 and she was lost to follow-up since that time.  She has been dealing with bipolar disorder and other issues and she did not come for her appointment.  She was seen recently by her primary care provider complaining of increasing fatigue and weakness as well as sleepiness she was referred back to me today for evaluation and recommendation regarding her condition.  She denied having any current chest pain but has shortness of breath with exertion with no cough or hemoptysis.  She denied having any dizzy spells.  She has no nausea, vomiting, diarrhea or constipation.  She has no headache or visual changes.  She is here today for evaluation and repeat blood work.   MEDICAL HISTORY: Past Medical History:  Diagnosis Date   Anxiety    Asthma    Bipolar affective disorder (HCC)    COPD (chronic obstructive pulmonary disease) (HCC)    Cough    Depression    DJD (degenerative joint disease) of lumbar spine    DVT (deep venous thrombosis) (HCC) 2010   groin left - on coumadin for 6 months   Family history of anesthesia complication    Hx: father has nausea   Fibromyalgia    GERD (gastroesophageal reflux disease)    Left leg DVT (HCC) 2010   Lymphedema 2019   Migraine    Pneumonia    PONV (postoperative nausea and vomiting)    and migraines   Shortness of breath    exertion   Spinal cord stimulator dysfunction (HCC)    has one in,but not working    Spinal headache    Vertigo    Hx: of   Vocal cord dysfunction    sees dr. Delton Coombes    ALLERGIES:  is allergic to lisinopril, lithium, statins, adhesive [tape], codeine, and scopolamine.  MEDICATIONS:  Current Outpatient Medications  Medication Sig Dispense Refill   albuterol (VENTOLIN HFA) 108 (90 Base) MCG/ACT inhaler Inhale 2 puffs into the lungs every 6 (six) hours as needed for wheezing or shortness of breath. 8 g 6   ALPRAZolam (XANAX) 1 MG tablet Take 2 mg by mouth at bedtime. May take a dose of 1 mg during the day if needed for anxiety     ARIPiprazole (ABILIFY) 30 MG tablet Take 30 mg by mouth at bedtime.  5   Ascorbic Acid (VITAMIN C GUMMIE PO) Take 1 tablet by mouth daily after breakfast.      bisacodyl (DULCOLAX) 5 MG EC tablet Take 5 mg by mouth every other day.     butalbital-acetaminophen-caffeine (FIORICET, ESGIC) 50-325-40 MG per tablet Take 2 tablets by mouth every 4 (four) hours as needed for headache or migraine.      calcitonin, salmon, (MIACALCIN/FORTICAL) 200 UNIT/ACT nasal spray Place 1 spray into alternate nostrils daily after breakfast.      calcium carbonate (OSCAL) 1500 (600 Ca) MG TABS tablet Take 600 mg of elemental calcium by mouth daily with breakfast.  Cholecalciferol (VITAMIN D) 2000 units CAPS Take 2,000 Units by mouth daily after breakfast.     cyclobenzaprine (FLEXERIL) 10 MG tablet Take 20 mg by mouth at bedtime.      cycloSPORINE (RESTASIS) 0.05 % ophthalmic emulsion Place 1 drop into both eyes 2 (two) times daily.     esomeprazole (NEXIUM) 40 MG capsule Take 40 mg by mouth daily before breakfast.       ezetimibe (ZETIA) 10 MG tablet Take 10 mg by mouth at bedtime.     fluticasone (FLONASE) 50 MCG/ACT nasal spray Place 2 sprays into both nostrils 2 (two) times daily. 16 g 11   HYDROmorphone (DILAUDID) 2 MG tablet Take 1 tablet by mouth every 8 (eight) hours as needed.     hydrOXYzine (ATARAX/VISTARIL) 25 MG tablet Take 25-50 mg by mouth 2 (two)  times daily. 25 mg every morning, 50 mg every night     lamoTRIgine (LAMICTAL) 150 MG tablet Take 300 mg by mouth at bedtime.     lisdexamfetamine (VYVANSE) 70 MG capsule Take 70 mg by mouth daily after breakfast.      Loratadine 10 MG CAPS Take 10 mg by mouth at bedtime.      meclizine (ANTIVERT) 25 MG tablet Take 25 mg by mouth 3 (three) times daily as needed for dizziness.      Melatonin 300 MCG TABS Take 300 mcg by mouth at bedtime.      omeprazole (PRILOSEC) 20 MG capsule Take 20 mg by mouth at bedtime.       Polyethyl Glycol-Propyl Glycol (SYSTANE OP) Place 1 drop into both eyes at bedtime.     Potassium Gluconate 595 MG CAPS Take 1 capsule by mouth 2 (two) times daily.     pramipexole (MIRAPEX) 0.5 MG tablet Take 0.5 mg by mouth at bedtime.       pregabalin (LYRICA) 300 MG capsule Take 300 mg by mouth daily with breakfast.     pregabalin (LYRICA) 300 MG capsule Take 300 mg by mouth at bedtime.     torsemide (DEMADEX) 5 MG tablet Take 5 mg by mouth daily.     triamcinolone (KENALOG) 0.025 % cream Apply 1 application topically 2 (two) times daily as needed (itching).     valACYclovir (VALTREX) 1000 MG tablet Take 1 tablet (1,000 mg total) by mouth 3 (three) times daily. 21 tablet 0   vitamin B-12 (CYANOCOBALAMIN) 1000 MCG tablet Take 1,000 mcg by mouth daily after breakfast.     No current facility-administered medications for this visit.    SURGICAL HISTORY:  Past Surgical History:  Procedure Laterality Date   ABDOMINAL HYSTERECTOMY  1980   partial    ANTERIOR LAT LUMBAR FUSION Left 02/15/2014   Procedure: ANTERIOR LATERAL LUMBAR INTERBODY FUSION LUMBAR TWO-THREE,LUMBAR THREE-FOUR WITH LATERAL PLATE.;  Surgeon: Temple Pacini, MD;  Location: [facility];  Service: Neurosurgery;  Laterality: Left;  left    APPENDECTOMY     BACK SURGERY     BREAST ENHANCEMENT SURGERY  1989   CARPAL TUNNEL RELEASE  2006   Right hand   CATARACT EXTRACTION W/ INTRAOCULAR LENS  IMPLANT, BILATERAL      Hx: of   CERVICAL FUSION  2005   CHOLECYSTECTOMY  1989   COLONOSCOPY W/ BIOPSIES AND POLYPECTOMY     Hx: of   FINGER ARTHROSCOPY WITH CARPOMETACARPEL ([facility]) ARTHROPLASTY     thumb   FRACTURE SURGERY Right 09/2013   ankle and leg - plate and screws   LUMBAR LAMINECTOMY/DECOMPRESSION  MICRODISCECTOMY Right 11/06/2012   Procedure: LUMBAR TWO THREE LUMBAR LAMINECTOMY/DECOMPRESSION MICRODISCECTOMY 1 LEVEL;  Surgeon: Temple Pacini, MD;  Location: [facility];  Service: Neurosurgery;  Laterality: Right;   OPEN REDUCTION INTERNAL FIXATION (ORIF) DISTAL RADIAL FRACTURE Right 05/05/2018   Procedure: OPEN REDUCTION INTERNAL FIXATION (ORIF) DISTAL RADIAL FRACTURE;  Surgeon: Dairl Ponder, MD;  Location: [facility];  Service: Orthopedics;  Laterality: Right;   ORIF ANKLE FRACTURE Right 2015   ORIF TIBIA FRACTURE Right 2015   PARTIAL HYSTERECTOMY  1980   ROTATOR CUFF REPAIR Right 2017   SHOULDER ARTHROSCOPY     SPINAL CORD STIMULATOR BATTERY EXCHANGE Right 09/09/2017   Procedure: SPINAL CORD STIMULATOR BATTERY EXCHANGE;  Surgeon: Odette Fraction, MD;  Location: [facility];  Service: Neurosurgery;  Laterality: Right;  SPINAL CORD STIMULATOR BATTERY EXCHANGE   SPINAL CORD STIMULATOR IMPLANT  2010   SPINAL FUSION  2018   L1, L2   TARSAL SUSPENSION     Hx; of left thumb   TONSILLECTOMY  1955   UPPER GI ENDOSCOPY     Hx: of    REVIEW OF SYSTEMS:  A comprehensive review of systems was negative except for: Constitutional: positive for fatigue Respiratory: positive for dyspnea on exertion Musculoskeletal: positive for arthralgias   PHYSICAL EXAMINATION: General appearance: alert, cooperative, fatigued, and no distress Head: Normocephalic, without obvious abnormality, atraumatic Neck: no adenopathy, no JVD, supple, symmetrical, trachea midline, and thyroid not enlarged, symmetric, no tenderness/mass/nodules Lymph nodes: Cervical, supraclavicular, and axillary nodes normal. Resp: clear to  auscultation bilaterally Back: symmetric, no curvature. ROM normal. No CVA tenderness. Cardio: regular rate and rhythm, S1, S2 normal, no murmur, click, rub or gallop GI: soft, non-tender; bowel sounds normal; no masses,  no organomegaly Extremities: extremities normal, atraumatic, no cyanosis or edema  ECOG PERFORMANCE STATUS: 1 - Symptomatic but completely ambulatory  Blood pressure 135/89, pulse 74, temperature 97.9 F (36.6 C), temperature source Oral, resp. rate 16, height 5\' 1"  (1.549 m), weight 248 lb 11.2 oz (112.8 kg), SpO2 98 %.  LABORATORY DATA: Lab Results  Component Value Date   WBC 9.5 09/22/2021   HGB 14.6 09/22/2021   HCT 43.7 09/22/2021   MCV 81.2 09/22/2021   PLT 322 09/22/2021      Chemistry      Component Value Date/Time   NA 142 09/06/2017 1347   K 3.7 09/06/2017 1347   CL 107 09/06/2017 1347   CO2 28 09/06/2017 1347   BUN 11 09/06/2017 1347   CREATININE 0.87 09/06/2017 1347      Component Value Date/Time   CALCIUM 9.1 09/06/2017 1347   ALKPHOS 121 (H) 02/06/2014 1333   AST 21 02/06/2014 1333   ALT 17 02/06/2014 1333   BILITOT 0.3 02/06/2014 1333       RADIOGRAPHIC STUDIES: No results found.  ASSESSMENT AND PLAN: This is a very pleasant 76 years old white female with history of iron deficiency anemia in addition to multiple other comorbidities.  She was treated with Feraheme infusion and had significant improvement in her hemoglobin and hematocrit as well as iron study and ferritin. She was lost to follow-up for more than 2 years.  The patient is here today for reevaluation and repeat CBC showed normal hemoglobin of 14.9 and hematocrit of 43.7%. Iron study and ferritin are still pending. I recommended for the patient to continue on observation for now with repeat CBC, iron study and ferritin in 6 months. She was advised to call immediately if she has  any other concerning symptoms in the interval. The patient voices understanding of current  disease status and treatment options and is in agreement with the current care plan. All questions were answered. The patient knows to call the clinic with any problems, questions or concerns. We can certainly see the patient much sooner if necessary.  Disclaimer: This note was dictated with voice recognition software. Similar sounding words can inadvertently be transcribed and may not be corrected upon review.

## 2021-09-24 DIAGNOSIS — I890 Lymphedema, not elsewhere classified: Secondary | ICD-10-CM | POA: Diagnosis not present

## 2021-09-24 DIAGNOSIS — N1831 Chronic kidney disease, stage 3a: Secondary | ICD-10-CM | POA: Diagnosis not present

## 2021-09-24 DIAGNOSIS — I10 Essential (primary) hypertension: Secondary | ICD-10-CM | POA: Diagnosis not present

## 2021-09-24 DIAGNOSIS — F3110 Bipolar disorder, current episode manic without psychotic features, unspecified: Secondary | ICD-10-CM | POA: Diagnosis not present

## 2021-09-24 DIAGNOSIS — E559 Vitamin D deficiency, unspecified: Secondary | ICD-10-CM | POA: Diagnosis not present

## 2021-09-24 DIAGNOSIS — K219 Gastro-esophageal reflux disease without esophagitis: Secondary | ICD-10-CM | POA: Diagnosis not present

## 2021-09-24 DIAGNOSIS — M6259 Muscle wasting and atrophy, not elsewhere classified, multiple sites: Secondary | ICD-10-CM | POA: Diagnosis not present

## 2021-09-24 DIAGNOSIS — E785 Hyperlipidemia, unspecified: Secondary | ICD-10-CM | POA: Diagnosis not present

## 2021-09-24 DIAGNOSIS — M797 Fibromyalgia: Secondary | ICD-10-CM | POA: Diagnosis not present

## 2021-09-29 DIAGNOSIS — M542 Cervicalgia: Secondary | ICD-10-CM | POA: Diagnosis not present

## 2021-10-06 DIAGNOSIS — Z79891 Long term (current) use of opiate analgesic: Secondary | ICD-10-CM | POA: Diagnosis not present

## 2021-10-06 DIAGNOSIS — G894 Chronic pain syndrome: Secondary | ICD-10-CM | POA: Diagnosis not present

## 2021-10-06 DIAGNOSIS — M25561 Pain in right knee: Secondary | ICD-10-CM | POA: Diagnosis not present

## 2021-10-06 DIAGNOSIS — M545 Low back pain, unspecified: Secondary | ICD-10-CM | POA: Diagnosis not present

## 2021-10-06 DIAGNOSIS — M542 Cervicalgia: Secondary | ICD-10-CM | POA: Diagnosis not present

## 2021-10-06 DIAGNOSIS — R202 Paresthesia of skin: Secondary | ICD-10-CM | POA: Diagnosis not present

## 2021-10-06 DIAGNOSIS — M25571 Pain in right ankle and joints of right foot: Secondary | ICD-10-CM | POA: Diagnosis not present

## 2021-10-06 DIAGNOSIS — M25512 Pain in left shoulder: Secondary | ICD-10-CM | POA: Diagnosis not present

## 2021-10-06 DIAGNOSIS — F1120 Opioid dependence, uncomplicated: Secondary | ICD-10-CM | POA: Diagnosis not present

## 2021-10-06 DIAGNOSIS — M25511 Pain in right shoulder: Secondary | ICD-10-CM | POA: Diagnosis not present

## 2021-10-06 DIAGNOSIS — M25572 Pain in left ankle and joints of left foot: Secondary | ICD-10-CM | POA: Diagnosis not present

## 2021-10-26 DIAGNOSIS — R42 Dizziness and giddiness: Secondary | ICD-10-CM | POA: Diagnosis not present

## 2021-10-26 DIAGNOSIS — E8881 Metabolic syndrome: Secondary | ICD-10-CM | POA: Diagnosis not present

## 2021-10-26 DIAGNOSIS — R222 Localized swelling, mass and lump, trunk: Secondary | ICD-10-CM | POA: Diagnosis not present

## 2021-10-26 DIAGNOSIS — N1831 Chronic kidney disease, stage 3a: Secondary | ICD-10-CM | POA: Diagnosis not present

## 2021-10-26 DIAGNOSIS — I10 Essential (primary) hypertension: Secondary | ICD-10-CM | POA: Diagnosis not present

## 2021-10-26 DIAGNOSIS — M545 Low back pain, unspecified: Secondary | ICD-10-CM | POA: Diagnosis not present

## 2021-11-09 DIAGNOSIS — E8881 Metabolic syndrome: Secondary | ICD-10-CM | POA: Diagnosis not present

## 2021-11-09 DIAGNOSIS — M542 Cervicalgia: Secondary | ICD-10-CM | POA: Diagnosis not present

## 2021-11-09 DIAGNOSIS — N1831 Chronic kidney disease, stage 3a: Secondary | ICD-10-CM | POA: Diagnosis not present

## 2021-11-09 DIAGNOSIS — F3130 Bipolar disorder, current episode depressed, mild or moderate severity, unspecified: Secondary | ICD-10-CM | POA: Diagnosis not present

## 2021-11-09 DIAGNOSIS — R222 Localized swelling, mass and lump, trunk: Secondary | ICD-10-CM | POA: Diagnosis not present

## 2021-11-09 DIAGNOSIS — F3110 Bipolar disorder, current episode manic without psychotic features, unspecified: Secondary | ICD-10-CM | POA: Diagnosis not present

## 2021-11-09 DIAGNOSIS — I10 Essential (primary) hypertension: Secondary | ICD-10-CM | POA: Diagnosis not present

## 2021-11-13 DIAGNOSIS — H16223 Keratoconjunctivitis sicca, not specified as Sjogren's, bilateral: Secondary | ICD-10-CM | POA: Diagnosis not present

## 2021-11-13 DIAGNOSIS — H02403 Unspecified ptosis of bilateral eyelids: Secondary | ICD-10-CM | POA: Diagnosis not present

## 2021-11-17 ENCOUNTER — Other Ambulatory Visit: Payer: Self-pay | Admitting: Emergency Medicine

## 2021-11-23 DIAGNOSIS — I10 Essential (primary) hypertension: Secondary | ICD-10-CM | POA: Diagnosis not present

## 2021-11-23 DIAGNOSIS — M545 Low back pain, unspecified: Secondary | ICD-10-CM | POA: Diagnosis not present

## 2021-11-23 DIAGNOSIS — E8881 Metabolic syndrome: Secondary | ICD-10-CM | POA: Diagnosis not present

## 2021-11-23 DIAGNOSIS — F3110 Bipolar disorder, current episode manic without psychotic features, unspecified: Secondary | ICD-10-CM | POA: Diagnosis not present

## 2021-11-23 DIAGNOSIS — N1831 Chronic kidney disease, stage 3a: Secondary | ICD-10-CM | POA: Diagnosis not present

## 2021-11-23 DIAGNOSIS — R229 Localized swelling, mass and lump, unspecified: Secondary | ICD-10-CM | POA: Diagnosis not present

## 2021-12-03 ENCOUNTER — Telehealth: Payer: Self-pay | Admitting: *Deleted

## 2021-12-03 ENCOUNTER — Encounter: Payer: Self-pay | Admitting: *Deleted

## 2021-12-03 NOTE — Patient Outreach (Signed)
  Care Coordination   Initial Visit Note   12/03/2021 Name: [name] MRN: [id number] DOB: 10-26-1945  [name] is a 76 y.o. year old female who sees Masneri, Adele Barthel, DO for primary care. I spoke with  Yolanda Bonine by phone today.  What matters to the patients health and wellness today?  Need refill on medications    Goals Addressed               This Visit's Progress     "I will need some refills on some medications" (pt-stated)        Care Coordination Interventions: Advised patient to contact her pharmacy for the needed refills if not successful contact her provider's office with this request to be call in to her pharmacy.  Reviewed medications with patient and discussed adherence  to all prescribed medications Reviewed scheduled/upcoming provider appointments including pending appointments  Screening for signs and symptoms of depression related to chronic disease state  Assessed social determinant of health barriers         SDOH assessments and interventions completed:  Yes  SDOH Interventions Today    Flowsheet Row Most Recent Value  SDOH Interventions   Food Insecurity Interventions Intervention Not Indicated  Housing Interventions Intervention Not Indicated  Transportation Interventions Intervention Not Indicated  Utilities Interventions Intervention Not Indicated        Care Coordination Interventions Activated:  Yes  Care Coordination Interventions:  Yes, provided   Follow up plan: No further intervention required.   Encounter Outcome:  Pt. Visit Completed   Raina Mina, RN Care Management Coordinator [place] Office [phone]

## 2021-12-03 NOTE — Patient Instructions (Signed)
Visit Information  Thank you for taking time to visit with me today. Please don't hesitate to contact me if I can be of assistance to you.   Following are the goals we discussed today:   Goals Addressed               This Visit's Progress     "I will need some refills on some medications" (pt-stated)        Care Coordination Interventions: Advised patient to contact her pharmacy for the needed refills if not successful contact her provider's office with this request to be call in to her pharmacy.  Reviewed medications with patient and discussed adherence  to all prescribed medications Reviewed scheduled/upcoming provider appointments including pending appointments  Screening for signs and symptoms of depression related to chronic disease state  Assessed social determinant of health barriers         Please call the care guide team at [phone] if you need to cancel or reschedule your appointment.   If you are experiencing a Mental Health or [place] or need someone to talk to, please call the Suicide and Crisis Lifeline: 988 call the [place] National Suicide Prevention Lifeline: (248)464-0769 or TTY: 458-291-4985 TTY 617-011-9113) to talk to a trained counselor call 1-800-273-TALK (toll free, 24 hour hotline)  Patient verbalizes understanding of instructions and care plan provided today and agrees to view in [place]. Active MyChart status and patient understanding of how to access instructions and care plan via MyChart confirmed with patient.     No further follow up required: No further needs at this time.  Raina Mina, RN Care Management Coordinator [place] Office [phone]

## 2021-12-09 DIAGNOSIS — H00011 Hordeolum externum right upper eyelid: Secondary | ICD-10-CM | POA: Diagnosis not present

## 2021-12-09 DIAGNOSIS — H02831 Dermatochalasis of right upper eyelid: Secondary | ICD-10-CM | POA: Diagnosis not present

## 2021-12-09 DIAGNOSIS — H04211 Epiphora due to excess lacrimation, right lacrimal gland: Secondary | ICD-10-CM | POA: Diagnosis not present

## 2021-12-09 DIAGNOSIS — H02834 Dermatochalasis of left upper eyelid: Secondary | ICD-10-CM | POA: Diagnosis not present

## 2021-12-14 DIAGNOSIS — N1831 Chronic kidney disease, stage 3a: Secondary | ICD-10-CM | POA: Diagnosis not present

## 2021-12-14 DIAGNOSIS — I10 Essential (primary) hypertension: Secondary | ICD-10-CM | POA: Diagnosis not present

## 2021-12-14 DIAGNOSIS — R229 Localized swelling, mass and lump, unspecified: Secondary | ICD-10-CM | POA: Diagnosis not present

## 2021-12-14 DIAGNOSIS — F3110 Bipolar disorder, current episode manic without psychotic features, unspecified: Secondary | ICD-10-CM | POA: Diagnosis not present

## 2021-12-15 DIAGNOSIS — H02423 Myogenic ptosis of bilateral eyelids: Secondary | ICD-10-CM | POA: Diagnosis not present

## 2021-12-15 DIAGNOSIS — H02831 Dermatochalasis of right upper eyelid: Secondary | ICD-10-CM | POA: Diagnosis not present

## 2021-12-15 DIAGNOSIS — H57813 Brow ptosis, bilateral: Secondary | ICD-10-CM | POA: Diagnosis not present

## 2021-12-15 DIAGNOSIS — H02834 Dermatochalasis of left upper eyelid: Secondary | ICD-10-CM | POA: Diagnosis not present

## 2021-12-15 DIAGNOSIS — H0279 Other degenerative disorders of eyelid and periocular area: Secondary | ICD-10-CM | POA: Diagnosis not present

## 2021-12-15 DIAGNOSIS — H02413 Mechanical ptosis of bilateral eyelids: Secondary | ICD-10-CM | POA: Diagnosis not present

## 2021-12-15 DIAGNOSIS — H53483 Generalized contraction of visual field, bilateral: Secondary | ICD-10-CM | POA: Diagnosis not present

## 2021-12-29 DIAGNOSIS — Z1231 Encounter for screening mammogram for malignant neoplasm of breast: Secondary | ICD-10-CM | POA: Diagnosis not present

## 2022-01-01 DIAGNOSIS — M25512 Pain in left shoulder: Secondary | ICD-10-CM | POA: Diagnosis not present

## 2022-01-01 DIAGNOSIS — M25511 Pain in right shoulder: Secondary | ICD-10-CM | POA: Diagnosis not present

## 2022-01-01 DIAGNOSIS — M25572 Pain in left ankle and joints of left foot: Secondary | ICD-10-CM | POA: Diagnosis not present

## 2022-01-01 DIAGNOSIS — Z79891 Long term (current) use of opiate analgesic: Secondary | ICD-10-CM | POA: Diagnosis not present

## 2022-01-01 DIAGNOSIS — M79604 Pain in right leg: Secondary | ICD-10-CM | POA: Diagnosis not present

## 2022-01-01 DIAGNOSIS — M25571 Pain in right ankle and joints of right foot: Secondary | ICD-10-CM | POA: Diagnosis not present

## 2022-01-01 DIAGNOSIS — F1120 Opioid dependence, uncomplicated: Secondary | ICD-10-CM | POA: Diagnosis not present

## 2022-01-01 DIAGNOSIS — M25561 Pain in right knee: Secondary | ICD-10-CM | POA: Diagnosis not present

## 2022-01-01 DIAGNOSIS — M545 Low back pain, unspecified: Secondary | ICD-10-CM | POA: Diagnosis not present

## 2022-01-01 DIAGNOSIS — G894 Chronic pain syndrome: Secondary | ICD-10-CM | POA: Diagnosis not present

## 2022-01-01 DIAGNOSIS — G8929 Other chronic pain: Secondary | ICD-10-CM | POA: Diagnosis not present

## 2022-01-06 DIAGNOSIS — I10 Essential (primary) hypertension: Secondary | ICD-10-CM | POA: Diagnosis not present

## 2022-01-06 DIAGNOSIS — G720 Drug-induced myopathy: Secondary | ICD-10-CM | POA: Diagnosis not present

## 2022-01-06 DIAGNOSIS — E785 Hyperlipidemia, unspecified: Secondary | ICD-10-CM | POA: Diagnosis not present

## 2022-01-06 DIAGNOSIS — Z6841 Body Mass Index (BMI) 40.0 and over, adult: Secondary | ICD-10-CM | POA: Diagnosis not present

## 2022-01-07 DIAGNOSIS — I10 Essential (primary) hypertension: Secondary | ICD-10-CM | POA: Diagnosis not present

## 2022-01-07 DIAGNOSIS — Z9889 Other specified postprocedural states: Secondary | ICD-10-CM | POA: Diagnosis not present

## 2022-01-07 DIAGNOSIS — Z6841 Body Mass Index (BMI) 40.0 and over, adult: Secondary | ICD-10-CM | POA: Diagnosis not present

## 2022-01-07 DIAGNOSIS — F3110 Bipolar disorder, current episode manic without psychotic features, unspecified: Secondary | ICD-10-CM | POA: Diagnosis not present

## 2022-01-25 ENCOUNTER — Telehealth: Payer: Self-pay | Admitting: *Deleted

## 2022-01-25 ENCOUNTER — Encounter: Payer: Self-pay | Admitting: *Deleted

## 2022-01-25 NOTE — Patient Instructions (Signed)
Visit Information  Thank you for taking time to visit with me today. Please don't hesitate to contact me if I can be of assistance to you.   Following are the goals we discussed today:   Goals Addressed               This Visit's Progress     COMPLETED: No needs (pt-stated)        Care Coordination Interventions: Reviewed medications with patient and discussed adherence with no needed refills Reviewed scheduled/upcoming provider appointments including sufficient transportation source Screening for signs and symptoms of depression related to chronic disease state  Assessed social determinant of health barriers        Please call the care guide team at [phone] if you need to cancel or reschedule your appointment.   If you are experiencing a Mental Health or Behavioral Health Crisis or need someone to talk to, please call the Suicide and Crisis Lifeline: 988 call the [place] National Suicide Prevention Lifeline: 4152557993 or TTY: (479)596-7662 TTY 334-520-3479) to talk to a trained counselor call 1-800-273-TALK (toll free, 24 hour hotline)  Patient verbalizes understanding of instructions and care plan provided today and agrees to view in MyChart. Active MyChart status and patient understanding of how to access instructions and care plan via MyChart confirmed with patient.     No further follow up required: No needs  Elliot Cousin, RN Care Management Coordinator [facility] Main Office [phone]

## 2022-01-25 NOTE — Patient Outreach (Signed)
  Care Coordination   Initial Visit Note   01/25/2022 Name: [name] MRN: [id number] DOB: [birth year]  [name] is a 76 y.o. year old female who sees [name] for primary care. I spoke with  [name] by phone today.  What matters to the patients health and wellness today?  No needs    Goals Addressed               This Visit's Progress     COMPLETED: No needs (pt-stated)        Care Coordination Interventions: Reviewed medications with patient and discussed adherence with no needed refills Reviewed scheduled/upcoming provider appointments including sufficient transportation source Screening for signs and symptoms of depression related to chronic disease state  Assessed social determinant of health barriers         SDOH assessments and interventions completed:  Yes  SDOH Interventions Today    Flowsheet Row Most Recent Value  SDOH Interventions   Food Insecurity Interventions Intervention Not Indicated  Housing Interventions Intervention Not Indicated  Transportation Interventions Intervention Not Indicated  Utilities Interventions Intervention Not Indicated        Care Coordination Interventions:  Yes, provided   Follow up plan: No further intervention required.   Encounter Outcome:  Pt. Visit Completed   Misty Cousin, RN Care Management Coordinator [facility] [facility] Main Office [phone]

## 2022-01-26 ENCOUNTER — Other Ambulatory Visit: Payer: Self-pay | Admitting: *Deleted

## 2022-01-26 DIAGNOSIS — M7989 Other specified soft tissue disorders: Secondary | ICD-10-CM

## 2022-02-09 ENCOUNTER — Ambulatory Visit (HOSPITAL_COMMUNITY): Admission: RE | Admit: 2022-02-09 | Payer: Medicare PPO | Source: Ambulatory Visit

## 2022-02-09 ENCOUNTER — Encounter: Payer: Medicare PPO | Admitting: Vascular Surgery

## 2022-02-11 DIAGNOSIS — H5213 Myopia, bilateral: Secondary | ICD-10-CM | POA: Diagnosis not present

## 2022-02-11 DIAGNOSIS — H532 Diplopia: Secondary | ICD-10-CM | POA: Diagnosis not present

## 2022-02-11 DIAGNOSIS — H16223 Keratoconjunctivitis sicca, not specified as Sjogren's, bilateral: Secondary | ICD-10-CM | POA: Diagnosis not present

## 2022-02-12 ENCOUNTER — Other Ambulatory Visit (HOSPITAL_BASED_OUTPATIENT_CLINIC_OR_DEPARTMENT_OTHER): Payer: Self-pay

## 2022-02-12 ENCOUNTER — Encounter: Payer: Self-pay | Admitting: Internal Medicine

## 2022-02-12 MED ORDER — WEGOVY 1 MG/0.5ML ~~LOC~~ SOAJ
1.0000 mg | SUBCUTANEOUS | 0 refills | Status: DC
  Filled 2022-02-12: qty 2, 28d supply, fill #0

## 2022-02-15 DIAGNOSIS — L820 Inflamed seborrheic keratosis: Secondary | ICD-10-CM | POA: Diagnosis not present

## 2022-02-15 DIAGNOSIS — D485 Neoplasm of uncertain behavior of skin: Secondary | ICD-10-CM | POA: Diagnosis not present

## 2022-03-01 ENCOUNTER — Other Ambulatory Visit (HOSPITAL_BASED_OUTPATIENT_CLINIC_OR_DEPARTMENT_OTHER): Payer: Self-pay

## 2022-03-01 DIAGNOSIS — I10 Essential (primary) hypertension: Secondary | ICD-10-CM | POA: Diagnosis not present

## 2022-03-01 DIAGNOSIS — Z6841 Body Mass Index (BMI) 40.0 and over, adult: Secondary | ICD-10-CM | POA: Diagnosis not present

## 2022-03-01 DIAGNOSIS — F3110 Bipolar disorder, current episode manic without psychotic features, unspecified: Secondary | ICD-10-CM | POA: Diagnosis not present

## 2022-03-01 MED ORDER — WEGOVY 1 MG/0.5ML ~~LOC~~ SOAJ
1.0000 mg | SUBCUTANEOUS | 0 refills | Status: DC
Start: 2022-03-01 — End: 2023-08-08
  Filled 2022-03-01: qty 2, 28d supply, fill #0

## 2022-03-03 DIAGNOSIS — H10411 Chronic giant papillary conjunctivitis, right eye: Secondary | ICD-10-CM | POA: Diagnosis not present

## 2022-03-03 DIAGNOSIS — H1820 Unspecified corneal edema: Secondary | ICD-10-CM | POA: Diagnosis not present

## 2022-03-10 DIAGNOSIS — H1820 Unspecified corneal edema: Secondary | ICD-10-CM | POA: Diagnosis not present

## 2022-03-10 DIAGNOSIS — H16201 Unspecified keratoconjunctivitis, right eye: Secondary | ICD-10-CM | POA: Diagnosis not present

## 2022-03-17 DIAGNOSIS — Z6839 Body mass index (BMI) 39.0-39.9, adult: Secondary | ICD-10-CM | POA: Diagnosis not present

## 2022-03-17 DIAGNOSIS — K5900 Constipation, unspecified: Secondary | ICD-10-CM | POA: Diagnosis not present

## 2022-03-17 DIAGNOSIS — I10 Essential (primary) hypertension: Secondary | ICD-10-CM | POA: Diagnosis not present

## 2022-03-24 DIAGNOSIS — H1820 Unspecified corneal edema: Secondary | ICD-10-CM | POA: Diagnosis not present

## 2022-03-24 DIAGNOSIS — H16201 Unspecified keratoconjunctivitis, right eye: Secondary | ICD-10-CM | POA: Diagnosis not present

## 2022-03-25 ENCOUNTER — Ambulatory Visit (HOSPITAL_COMMUNITY)
Admission: RE | Admit: 2022-03-25 | Discharge: 2022-03-25 | Disposition: A | Discharge status: Home / Self Care | Payer: Medicare PPO | Source: Ambulatory Visit | Attending: Physician Assistant | Admitting: Physician Assistant

## 2022-03-25 ENCOUNTER — Ambulatory Visit: Payer: Medicare PPO | Admitting: Physician Assistant

## 2022-03-25 VITALS — BP 123/70 | HR 68 | Temp 97.5°F | Resp 20 | Ht 61.0 in

## 2022-03-25 DIAGNOSIS — M7989 Other specified soft tissue disorders: Secondary | ICD-10-CM | POA: Diagnosis not present

## 2022-03-25 DIAGNOSIS — I872 Venous insufficiency (chronic) (peripheral): Secondary | ICD-10-CM

## 2022-03-25 NOTE — Progress Notes (Signed)
[facility] [place]   Reason for referral: Swollen B leg  History of Present Illness  [name] is a 77 y.o. female who presents with chief complaint: Loose skin in the upper thighs B and known arthritic knee pain B LE.    She has lost 50-60 lbs in the last year.  She is not ambulatory except to get in/out of the bathroom.  Her main mode of mobility is a wheel chair.    She has been seen in the past by Dr. Donnetta Hutching.  She has a history of lymphedema.  She has multiple arthritic difficulty in her hands and therefore cannot place compression garments.  She has lymphedema sequential pump admit request for coverage of this through insurance.  She states she uses the sequential compression.  She is unable to lay in the bed to sleep so she sleeps in a chair due to chronic lumbar pain after multiple surgeries.    Her situation has not changed as stated above she does not tolerate thigh high compression.  She denise symptoms of claudication at very short distances, no non healing wounds and no rest pain.  .      Past Medical History:  Diagnosis Date   Anxiety    Asthma    Bipolar affective disorder ([place])    COPD (chronic obstructive pulmonary disease) (HCC)    Cough    Depression    DJD (degenerative joint disease) of lumbar spine    DVT (deep venous thrombosis) ([place]) 2010   groin left - on coumadin for 6 months   Family history of anesthesia complication    Hx: father has nausea   Fibromyalgia    GERD (gastroesophageal reflux disease)    Left leg DVT ([place]) 2010   Lymphedema 2019   Migraine    Pneumonia    PONV (postoperative nausea and vomiting)    and migraines   Shortness of breath    exertion   Spinal cord stimulator dysfunction (HCC)    has one in,but not working   Spinal headache    Vertigo    Hx: of   Vocal cord dysfunction    sees dr. Lamonte Sakai    Past Surgical History:  Procedure Laterality Date   ABDOMINAL HYSTERECTOMY  1980   partial    ANTERIOR  LAT LUMBAR FUSION Left 02/15/2014   Procedure: ANTERIOR LATERAL LUMBAR INTERBODY FUSION LUMBAR TWO-THREE,LUMBAR THREE-FOUR WITH LATERAL PLATE.;  Surgeon: Charlie Pitter, MD;  Location: [facility];  Service: Neurosurgery;  Laterality: Left;  left    APPENDECTOMY     BACK SURGERY     BREAST ENHANCEMENT SURGERY  1989   CARPAL TUNNEL RELEASE  2006   Right hand   CATARACT EXTRACTION W/ INTRAOCULAR LENS  IMPLANT, BILATERAL     Hx: of   CERVICAL FUSION  2005   CHOLECYSTECTOMY  1989   COLONOSCOPY W/ BIOPSIES AND POLYPECTOMY     Hx: of   FINGER ARTHROSCOPY WITH CARPOMETACARPEL ([facility]) ARTHROPLASTY     thumb   FRACTURE SURGERY Right 09/2013   ankle and leg - plate and screws   LUMBAR LAMINECTOMY/DECOMPRESSION MICRODISCECTOMY Right 11/06/2012   Procedure: LUMBAR TWO THREE LUMBAR LAMINECTOMY/DECOMPRESSION MICRODISCECTOMY 1 LEVEL;  Surgeon: Charlie Pitter, MD;  Location: [facility];  Service: Neurosurgery;  Laterality: Right;   OPEN REDUCTION INTERNAL FIXATION (ORIF) DISTAL RADIAL FRACTURE Right 05/05/2018   Procedure: OPEN REDUCTION INTERNAL FIXATION (ORIF) DISTAL RADIAL FRACTURE;  Surgeon: Charlotte Crumb, MD;  Location:  [facility];  Service: Orthopedics;  Laterality: Right;   ORIF ANKLE FRACTURE Right 2015   ORIF TIBIA FRACTURE Right 2015   PARTIAL HYSTERECTOMY  1980   ROTATOR CUFF REPAIR Right 2017   SHOULDER ARTHROSCOPY     SPINAL CORD STIMULATOR BATTERY EXCHANGE Right 09/09/2017   Procedure: SPINAL CORD STIMULATOR BATTERY EXCHANGE;  Surgeon: Clydell Hakim, MD;  Location: [facility];  Service: Neurosurgery;  Laterality: Right;  SPINAL CORD STIMULATOR BATTERY EXCHANGE   SPINAL CORD STIMULATOR IMPLANT  2010   SPINAL FUSION  2018   L1, L2   TARSAL SUSPENSION     Hx; of left thumb   TONSILLECTOMY  1955   UPPER GI ENDOSCOPY     Hx: of    Social History   Socioeconomic History   Marital status: Married    Spouse name: Not on file   Number of children: Not on file   Years of  education: Not on file   Highest education level: Not on file  Occupational History   Not on file  Tobacco Use   Smoking status: Former    Packs/day: 2.50    Years: 21.00    Total pack years: 52.50    Types: Cigarettes    Quit date: 09/16/1986    Years since quitting: 35.5   Smokeless tobacco: Never  Vaping Use   Vaping Use: Never used  Substance and Sexual Activity   Alcohol use: No    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   Drug use: No   Sexual activity: Not on file  Other Topics Concern   Not on file  Social History Narrative   Not on file   Social Determinants of Health   Financial Resource Strain: Not on file  Food Insecurity: No Food Insecurity (01/25/2022)   Hunger Vital Sign    Worried About Running Out of Food in the Last Year: Never true    Ran Out of Food in the Last Year: Never true  Transportation Needs: No Transportation Needs (01/25/2022)   PRAPARE - Hydrologist (Medical): No    Lack of Transportation (Non-Medical): No  Physical Activity: Not on file  Stress: Not on file  Social Connections: Not on file  Intimate Partner Violence: Not At Risk (12/03/2021)   Humiliation, Afraid, Rape, and Kick questionnaire    Fear of Current or Ex-Partner: No    Emotionally Abused: No    Physically Abused: No    Sexually Abused: No    Family History  Problem Relation Age of Onset   Lung cancer Mother    Prostate cancer Father    Heart disease Father    Rheum arthritis Paternal Grandmother    Asthma Sister     Current Outpatient Medications on File Prior to Visit  Medication Sig Dispense Refill   albuterol (VENTOLIN HFA) 108 (90 Base) MCG/ACT inhaler INHALE 2 PUFFS EVERY 6 HOURS AS NEEDED FOR WHEEZING OR SHORTNESS OF BREATH. 18 g 6   ALPRAZolam (XANAX) 1 MG tablet Take 2 mg by mouth at bedtime. May take a dose of 1 mg during the day if needed for anxiety     ARIPiprazole (ABILIFY) 30 MG tablet Take 30 mg by mouth at  bedtime.  5   Ascorbic Acid (VITAMIN C GUMMIE PO) Take 1 tablet by mouth daily after breakfast.      bisacodyl (DULCOLAX) 5 MG EC tablet Take 5 mg by mouth every other day.  butalbital-acetaminophen-caffeine (FIORICET, ESGIC) 50-325-40 MG per tablet Take 2 tablets by mouth every 4 (four) hours as needed for headache or migraine.      calcitonin, salmon, (MIACALCIN/FORTICAL) 200 UNIT/ACT nasal spray Place 1 spray into alternate nostrils daily after breakfast.      calcium carbonate (OSCAL) 1500 (600 Ca) MG TABS tablet Take 600 mg of elemental calcium by mouth daily with breakfast.     Cholecalciferol (VITAMIN D) 2000 units CAPS Take 2,000 Units by mouth daily after breakfast.     cyclobenzaprine (FLEXERIL) 10 MG tablet Take 20 mg by mouth at bedtime.      cycloSPORINE (RESTASIS) 0.05 % ophthalmic emulsion Place 1 drop into both eyes 2 (two) times daily.     esomeprazole (NEXIUM) 40 MG capsule Take 40 mg by mouth daily before breakfast.       ezetimibe (ZETIA) 10 MG tablet Take 10 mg by mouth at bedtime.     fluticasone (FLONASE) 50 MCG/ACT nasal spray Place 2 sprays into both nostrils 2 (two) times daily. 16 g 11   HYDROmorphone (DILAUDID) 2 MG tablet Take 1 tablet by mouth every 8 (eight) hours as needed.     hydrOXYzine (ATARAX/VISTARIL) 25 MG tablet Take 25-50 mg by mouth 2 (two) times daily. 25 mg every morning, 50 mg every night     lamoTRIgine (LAMICTAL) 150 MG tablet Take 300 mg by mouth at bedtime.     lisdexamfetamine (VYVANSE) 70 MG capsule Take 70 mg by mouth daily after breakfast.      Loratadine 10 MG CAPS Take 10 mg by mouth at bedtime.      meclizine (ANTIVERT) 25 MG tablet Take 25 mg by mouth 3 (three) times daily as needed for dizziness.      Melatonin 300 MCG TABS Take 300 mcg by mouth at bedtime.     omeprazole (PRILOSEC) 20 MG capsule Take 20 mg by mouth at bedtime.       Polyethyl Glycol-Propyl Glycol (SYSTANE OP) Place 1 drop into both eyes at bedtime.     Potassium  Gluconate 595 MG CAPS Take 1 capsule by mouth 2 (two) times daily.     pramipexole (MIRAPEX) 0.5 MG tablet Take 0.5 mg by mouth at bedtime.       pregabalin (LYRICA) 300 MG capsule Take 300 mg by mouth daily with breakfast.     pregabalin (LYRICA) 300 MG capsule Take 300 mg by mouth at bedtime.     Semaglutide-Weight Management (WEGOVY) 1 MG/0.5ML SOAJ Inject 1 mg under the skin once a week 30 days 2 mL 0   torsemide (DEMADEX) 5 MG tablet Take 5 mg by mouth daily.     triamcinolone (KENALOG) 0.025 % cream Apply 1 application topically 2 (two) times daily as needed (itching).     valACYclovir (VALTREX) 1000 MG tablet Take 1 tablet (1,000 mg total) by mouth 3 (three) times daily. 21 tablet 0   vitamin B-12 (CYANOCOBALAMIN) 1000 MCG tablet Take 1,000 mcg by mouth daily after breakfast.     No current facility-administered medications on file prior to visit.    Allergies as of 03/25/2022 - Review Complete 03/25/2022  Allergen Reaction Noted   Lisinopril Other (See Comments) 08/04/2012   Lithium Other (See Comments) 05/20/2010   Statins Other (See Comments) 08/04/2012   Adhesive [tape] Rash 11/01/2012   Codeine Nausea And Vomiting 05/31/2016   Scopolamine Other (See Comments) 02/15/2014     ROS:   General:  50-60 lbs weight loss on purpose, Fever,  chills  HEENT: No recent headaches, no nasal bleeding, no visual changes, no sore throat  Neurologic: No dizziness, blackouts, seizures. No recent symptoms of stroke or mini- stroke. No recent episodes of slurred speech, or temporary blindness.  Cardiac: No recent episodes of chest pain/pressure, no shortness of breath at rest.  No shortness of breath with exertion.  Denies history of atrial fibrillation or irregular heartbeat  Vascular: No history of rest pain in feet.  No history of claudication.  No history of non-healing ulcer, 2010 history of left DVT   Pulmonary: No home oxygen, no productive cough, no hemoptysis,  No asthma or  wheezing  Musculoskeletal:  [ ]$  Arthritis, [ x] Low back pain,  [ ]$  Joint pain  Hematologic:No history of hypercoagulable state.  No history of easy bleeding.  No history of anemia  Gastrointestinal: No hematochezia or melena,  No gastroesophageal reflux, no trouble swallowing  Urinary: [ ]$  chronic Kidney disease, [ ]$  on HD - [ ]$  MWF or [ ]$  TTHS, [ ]$  Burning with urination, [ ]$  Frequent urination, [ ]$  Difficulty urinating;   Skin: No rashes  Psychological: No history of anxiety,  No history of depression  Physical Examination  Vitals:   03/25/22 1345  BP: 123/70  Pulse: 68  Resp: 20  Temp: (!) 97.5 F (36.4 C)  TempSrc: Temporal  SpO2: 97%  Height: 5' 1"$  (1.549 m)    Body mass index is 46.99 kg/m.  General:  Alert and oriented, no acute distress HEENT: Normal Neck: No bruit or JVD Pulmonary: Clear to auscultation bilaterally Cardiac: Regular Rate and Rhythm without murmur Abdomen: Soft, non-tender, non-distended, no mass, no scars Skin: No rash, erythema or brawny skin changes Extremity Pulses:  radial, palpable  dorsalis pedis, posterior tibial signals bilaterally Musculoskeletal: No deformity or edema  Neurologic: Upper and lower extremity motor grossly intact and symmetric  DATA: Venous Reflux Times  +--------------+---------+------+-----------+------------+--------+  RIGHT        Reflux NoRefluxReflux TimeDiameter cmsComments                          Yes                                   +--------------+---------+------+-----------+------------+--------+  CFV          no                                              +--------------+---------+------+-----------+------------+--------+  FV prox       no                                              +--------------+---------+------+-----------+------------+--------+  FV mid        no                                               +--------------+---------+------+-----------+------------+--------+  FV dist                 yes                                   +--------------+---------+------+-----------+------------+--------+  Popliteal    no                                              +--------------+---------+------+-----------+------------+--------+  GSV at [facility]              yes    >500 ms      0.59              +--------------+---------+------+-----------+------------+--------+  GSV prox thigh          yes    >500 ms      0.62              +--------------+---------+------+-----------+------------+--------+  GSV mid thigh           yes    >500 ms      0.47              +--------------+---------+------+-----------+------------+--------+  GSV dist thigh          yes    >500 ms      0.34              +--------------+---------+------+-----------+------------+--------+  GSV at knee             yes    >500 ms      0.33              +--------------+---------+------+-----------+------------+--------+  GSV prox calf           yes    >500 ms      0.27              +--------------+---------+------+-----------+------------+--------+  SSV Pop Fossa no                            0.31              +--------------+---------+------+-----------+------------+--------+  SSV prox calf no                            0.33              +--------------+---------+------+-----------+------------+--------+        Summary:  Right:  - No evidence of deep vein thrombosis seen in the right lower extremity,  from the common femoral through the popliteal veins.  - No evidence of superficial venous thrombosis in the right lower  extremity.    - No evidence of superficial venous reflux seen in the right short  saphenous vein.    - Venous reflux is noted in the right sapheno-femoral junction.  - Venous reflux is noted in the right greater saphenous vein in the thigh.  -  Venous reflux is noted in the right greater saphenous vein in the calf.  - Venous reflux is noted in the right profunda femoral vein.  - Venous reflux is noted in the right femoral vein.    - Abnormal hypoechoic roughly spherical nonvascular structure in the Right  medial Knee. Note Patient has hx of skin Ca in the Right kne    Assessment/Plan: History of lymphedema.  She has multiple arthritic difficulty in her hands and therefore cannot place compression garments.  She has lymphedema sequential pump admit request for coverage of this through insurance.   Right LE reflux with no evidence of deep reflux, she does have SFJ reflux and  GSV reflux with vein size > 0.4 in 2 areas.  The treatment plan as above will be continued.  She will continue to use the lymphedema pumps.  The right LE venous duplex demonstrates reflux in the SFJ throughout the GSV.  Her skin is healthy, she is non ambulatory, she will try knee high compression at home for daily edema and taken them off at night.    She will monitor her skin for damage, elevate as tolerates continue with lymph edema pumps.  I do not think venous intervention is indicated since she does not tolerate the thigh high compression.  F/U PRN.      Roxy Horseman PA-C [facility] [place] Office: [phone]  MD in clinic [place]

## 2022-03-25 NOTE — Progress Notes (Signed)
   Established Patient Office Visit  Subjective   Patient ID: Addalie Calles, female    DOB: 1945/07/13  Age: 77 y.o. MRN: [id number]  No chief complaint on file.   HPI    ROS    Objective:     There were no vitals taken for this visit.   Physical Exam   No results found for any visits on 03/25/22.    The ASCVD Risk score (Arnett DK, et al., 2019) failed to calculate for the following reasons:   Cannot find a previous HDL lab   Cannot find a previous total cholesterol lab    Assessment & Plan:   Problem List Items Addressed This Visit   None   No follow-ups on file.    Roxy Horseman, PA-C

## 2022-03-26 DIAGNOSIS — G894 Chronic pain syndrome: Secondary | ICD-10-CM | POA: Diagnosis not present

## 2022-03-26 DIAGNOSIS — M25512 Pain in left shoulder: Secondary | ICD-10-CM | POA: Diagnosis not present

## 2022-03-26 DIAGNOSIS — M545 Low back pain, unspecified: Secondary | ICD-10-CM | POA: Diagnosis not present

## 2022-03-26 DIAGNOSIS — Z79891 Long term (current) use of opiate analgesic: Secondary | ICD-10-CM | POA: Diagnosis not present

## 2022-03-26 DIAGNOSIS — R202 Paresthesia of skin: Secondary | ICD-10-CM | POA: Diagnosis not present

## 2022-03-26 DIAGNOSIS — M25571 Pain in right ankle and joints of right foot: Secondary | ICD-10-CM | POA: Diagnosis not present

## 2022-03-26 DIAGNOSIS — M79604 Pain in right leg: Secondary | ICD-10-CM | POA: Diagnosis not present

## 2022-03-26 DIAGNOSIS — M25511 Pain in right shoulder: Secondary | ICD-10-CM | POA: Diagnosis not present

## 2022-03-26 DIAGNOSIS — F1120 Opioid dependence, uncomplicated: Secondary | ICD-10-CM | POA: Diagnosis not present

## 2022-03-26 DIAGNOSIS — M542 Cervicalgia: Secondary | ICD-10-CM | POA: Diagnosis not present

## 2022-03-26 DIAGNOSIS — M25572 Pain in left ankle and joints of left foot: Secondary | ICD-10-CM | POA: Diagnosis not present

## 2022-04-01 DIAGNOSIS — K5900 Constipation, unspecified: Secondary | ICD-10-CM | POA: Diagnosis not present

## 2022-04-01 DIAGNOSIS — Z6839 Body mass index (BMI) 39.0-39.9, adult: Secondary | ICD-10-CM | POA: Diagnosis not present

## 2022-04-07 DIAGNOSIS — H16101 Unspecified superficial keratitis, right eye: Secondary | ICD-10-CM | POA: Diagnosis not present

## 2022-04-07 DIAGNOSIS — H1820 Unspecified corneal edema: Secondary | ICD-10-CM | POA: Diagnosis not present

## 2022-04-21 DIAGNOSIS — F3130 Bipolar disorder, current episode depressed, mild or moderate severity, unspecified: Secondary | ICD-10-CM | POA: Diagnosis not present

## 2022-04-21 DIAGNOSIS — I10 Essential (primary) hypertension: Secondary | ICD-10-CM | POA: Diagnosis not present

## 2022-04-21 DIAGNOSIS — K5900 Constipation, unspecified: Secondary | ICD-10-CM | POA: Diagnosis not present

## 2022-04-21 DIAGNOSIS — Z6839 Body mass index (BMI) 39.0-39.9, adult: Secondary | ICD-10-CM | POA: Diagnosis not present

## 2022-04-27 DIAGNOSIS — H10501 Unspecified blepharoconjunctivitis, right eye: Secondary | ICD-10-CM | POA: Diagnosis not present

## 2022-05-10 DIAGNOSIS — Z6839 Body mass index (BMI) 39.0-39.9, adult: Secondary | ICD-10-CM | POA: Diagnosis not present

## 2022-05-10 DIAGNOSIS — F3130 Bipolar disorder, current episode depressed, mild or moderate severity, unspecified: Secondary | ICD-10-CM | POA: Diagnosis not present

## 2022-05-10 DIAGNOSIS — I10 Essential (primary) hypertension: Secondary | ICD-10-CM | POA: Diagnosis not present

## 2022-05-10 DIAGNOSIS — K5900 Constipation, unspecified: Secondary | ICD-10-CM | POA: Diagnosis not present

## 2022-05-12 DIAGNOSIS — G720 Drug-induced myopathy: Secondary | ICD-10-CM | POA: Diagnosis not present

## 2022-05-12 DIAGNOSIS — N1831 Chronic kidney disease, stage 3a: Secondary | ICD-10-CM | POA: Diagnosis not present

## 2022-05-12 DIAGNOSIS — T466X5A Adverse effect of antihyperlipidemic and antiarteriosclerotic drugs, initial encounter: Secondary | ICD-10-CM | POA: Diagnosis not present

## 2022-05-12 DIAGNOSIS — E782 Mixed hyperlipidemia: Secondary | ICD-10-CM | POA: Diagnosis not present

## 2022-05-25 DIAGNOSIS — H02423 Myogenic ptosis of bilateral eyelids: Secondary | ICD-10-CM | POA: Diagnosis not present

## 2022-05-25 DIAGNOSIS — H02231 Paralytic lagophthalmos right upper eyelid: Secondary | ICD-10-CM | POA: Diagnosis not present

## 2022-05-25 DIAGNOSIS — H0279 Other degenerative disorders of eyelid and periocular area: Secondary | ICD-10-CM | POA: Diagnosis not present

## 2022-05-25 DIAGNOSIS — Z09 Encounter for follow-up examination after completed treatment for conditions other than malignant neoplasm: Secondary | ICD-10-CM | POA: Diagnosis not present

## 2022-05-25 DIAGNOSIS — H16231 Neurotrophic keratoconjunctivitis, right eye: Secondary | ICD-10-CM | POA: Diagnosis not present

## 2022-05-25 DIAGNOSIS — H04123 Dry eye syndrome of bilateral lacrimal glands: Secondary | ICD-10-CM | POA: Diagnosis not present

## 2022-05-28 DIAGNOSIS — H18811 Anesthesia and hypoesthesia of cornea, right eye: Secondary | ICD-10-CM | POA: Diagnosis not present

## 2022-05-28 DIAGNOSIS — H16142 Punctate keratitis, left eye: Secondary | ICD-10-CM | POA: Diagnosis not present

## 2022-05-28 DIAGNOSIS — H0279 Other degenerative disorders of eyelid and periocular area: Secondary | ICD-10-CM | POA: Diagnosis not present

## 2022-05-28 DIAGNOSIS — H16211 Exposure keratoconjunctivitis, right eye: Secondary | ICD-10-CM | POA: Diagnosis not present

## 2022-05-28 DIAGNOSIS — H18812 Anesthesia and hypoesthesia of cornea, left eye: Secondary | ICD-10-CM | POA: Diagnosis not present

## 2022-05-28 DIAGNOSIS — L2089 Other atopic dermatitis: Secondary | ICD-10-CM | POA: Diagnosis not present

## 2022-05-28 DIAGNOSIS — H16231 Neurotrophic keratoconjunctivitis, right eye: Secondary | ICD-10-CM | POA: Diagnosis not present

## 2022-05-28 DIAGNOSIS — H16141 Punctate keratitis, right eye: Secondary | ICD-10-CM | POA: Diagnosis not present

## 2022-05-28 DIAGNOSIS — H0222A Mechanical lagophthalmos right eye, upper and lower eyelids: Secondary | ICD-10-CM | POA: Diagnosis not present

## 2022-06-09 DIAGNOSIS — K5900 Constipation, unspecified: Secondary | ICD-10-CM | POA: Diagnosis not present

## 2022-06-09 DIAGNOSIS — F3130 Bipolar disorder, current episode depressed, mild or moderate severity, unspecified: Secondary | ICD-10-CM | POA: Diagnosis not present

## 2022-06-09 DIAGNOSIS — Z6835 Body mass index (BMI) 35.0-35.9, adult: Secondary | ICD-10-CM | POA: Diagnosis not present

## 2022-06-09 DIAGNOSIS — I10 Essential (primary) hypertension: Secondary | ICD-10-CM | POA: Diagnosis not present

## 2022-06-11 DIAGNOSIS — H0222A Mechanical lagophthalmos right eye, upper and lower eyelids: Secondary | ICD-10-CM | POA: Diagnosis not present

## 2022-06-11 DIAGNOSIS — H0279 Other degenerative disorders of eyelid and periocular area: Secondary | ICD-10-CM | POA: Diagnosis not present

## 2022-06-11 DIAGNOSIS — H18812 Anesthesia and hypoesthesia of cornea, left eye: Secondary | ICD-10-CM | POA: Diagnosis not present

## 2022-06-11 DIAGNOSIS — H18811 Anesthesia and hypoesthesia of cornea, right eye: Secondary | ICD-10-CM | POA: Diagnosis not present

## 2022-06-11 DIAGNOSIS — H16231 Neurotrophic keratoconjunctivitis, right eye: Secondary | ICD-10-CM | POA: Diagnosis not present

## 2022-06-11 DIAGNOSIS — H16211 Exposure keratoconjunctivitis, right eye: Secondary | ICD-10-CM | POA: Diagnosis not present

## 2022-06-11 DIAGNOSIS — H16142 Punctate keratitis, left eye: Secondary | ICD-10-CM | POA: Diagnosis not present

## 2022-06-11 DIAGNOSIS — H16141 Punctate keratitis, right eye: Secondary | ICD-10-CM | POA: Diagnosis not present

## 2022-06-11 DIAGNOSIS — L2089 Other atopic dermatitis: Secondary | ICD-10-CM | POA: Diagnosis not present

## 2022-06-18 DIAGNOSIS — R202 Paresthesia of skin: Secondary | ICD-10-CM | POA: Diagnosis not present

## 2022-06-18 DIAGNOSIS — Z79891 Long term (current) use of opiate analgesic: Secondary | ICD-10-CM | POA: Diagnosis not present

## 2022-06-18 DIAGNOSIS — M545 Low back pain, unspecified: Secondary | ICD-10-CM | POA: Diagnosis not present

## 2022-06-18 DIAGNOSIS — M25572 Pain in left ankle and joints of left foot: Secondary | ICD-10-CM | POA: Diagnosis not present

## 2022-06-18 DIAGNOSIS — G894 Chronic pain syndrome: Secondary | ICD-10-CM | POA: Diagnosis not present

## 2022-06-18 DIAGNOSIS — M25512 Pain in left shoulder: Secondary | ICD-10-CM | POA: Diagnosis not present

## 2022-06-18 DIAGNOSIS — G8929 Other chronic pain: Secondary | ICD-10-CM | POA: Diagnosis not present

## 2022-06-18 DIAGNOSIS — F1120 Opioid dependence, uncomplicated: Secondary | ICD-10-CM | POA: Diagnosis not present

## 2022-06-18 DIAGNOSIS — M25571 Pain in right ankle and joints of right foot: Secondary | ICD-10-CM | POA: Diagnosis not present

## 2022-06-18 DIAGNOSIS — M542 Cervicalgia: Secondary | ICD-10-CM | POA: Diagnosis not present

## 2022-06-18 DIAGNOSIS — M25511 Pain in right shoulder: Secondary | ICD-10-CM | POA: Diagnosis not present

## 2022-06-25 DIAGNOSIS — H16223 Keratoconjunctivitis sicca, not specified as Sjogren's, bilateral: Secondary | ICD-10-CM | POA: Diagnosis not present

## 2022-06-25 DIAGNOSIS — H0222A Mechanical lagophthalmos right eye, upper and lower eyelids: Secondary | ICD-10-CM | POA: Diagnosis not present

## 2022-06-25 DIAGNOSIS — H16211 Exposure keratoconjunctivitis, right eye: Secondary | ICD-10-CM | POA: Diagnosis not present

## 2022-06-25 DIAGNOSIS — H0279 Other degenerative disorders of eyelid and periocular area: Secondary | ICD-10-CM | POA: Diagnosis not present

## 2022-06-25 DIAGNOSIS — H16143 Punctate keratitis, bilateral: Secondary | ICD-10-CM | POA: Diagnosis not present

## 2022-07-09 DIAGNOSIS — H16143 Punctate keratitis, bilateral: Secondary | ICD-10-CM | POA: Diagnosis not present

## 2022-07-09 DIAGNOSIS — H16223 Keratoconjunctivitis sicca, not specified as Sjogren's, bilateral: Secondary | ICD-10-CM | POA: Diagnosis not present

## 2022-07-09 DIAGNOSIS — H0222A Mechanical lagophthalmos right eye, upper and lower eyelids: Secondary | ICD-10-CM | POA: Diagnosis not present

## 2022-07-09 DIAGNOSIS — H0279 Other degenerative disorders of eyelid and periocular area: Secondary | ICD-10-CM | POA: Diagnosis not present

## 2022-07-09 DIAGNOSIS — H16211 Exposure keratoconjunctivitis, right eye: Secondary | ICD-10-CM | POA: Diagnosis not present

## 2022-07-12 DIAGNOSIS — F3130 Bipolar disorder, current episode depressed, mild or moderate severity, unspecified: Secondary | ICD-10-CM | POA: Diagnosis not present

## 2022-07-12 DIAGNOSIS — K5900 Constipation, unspecified: Secondary | ICD-10-CM | POA: Diagnosis not present

## 2022-07-12 DIAGNOSIS — I890 Lymphedema, not elsewhere classified: Secondary | ICD-10-CM | POA: Diagnosis not present

## 2022-07-12 DIAGNOSIS — I10 Essential (primary) hypertension: Secondary | ICD-10-CM | POA: Diagnosis not present

## 2022-07-12 DIAGNOSIS — Z6835 Body mass index (BMI) 35.0-35.9, adult: Secondary | ICD-10-CM | POA: Diagnosis not present

## 2022-07-12 DIAGNOSIS — N1831 Chronic kidney disease, stage 3a: Secondary | ICD-10-CM | POA: Diagnosis not present

## 2022-08-16 DIAGNOSIS — F3130 Bipolar disorder, current episode depressed, mild or moderate severity, unspecified: Secondary | ICD-10-CM | POA: Diagnosis not present

## 2022-08-16 DIAGNOSIS — I890 Lymphedema, not elsewhere classified: Secondary | ICD-10-CM | POA: Diagnosis not present

## 2022-08-16 DIAGNOSIS — I10 Essential (primary) hypertension: Secondary | ICD-10-CM | POA: Diagnosis not present

## 2022-08-16 DIAGNOSIS — Z6835 Body mass index (BMI) 35.0-35.9, adult: Secondary | ICD-10-CM | POA: Diagnosis not present

## 2022-08-16 DIAGNOSIS — K5900 Constipation, unspecified: Secondary | ICD-10-CM | POA: Diagnosis not present

## 2022-08-16 DIAGNOSIS — N1831 Chronic kidney disease, stage 3a: Secondary | ICD-10-CM | POA: Diagnosis not present

## 2022-08-17 DIAGNOSIS — L820 Inflamed seborrheic keratosis: Secondary | ICD-10-CM | POA: Diagnosis not present

## 2022-08-17 DIAGNOSIS — L821 Other seborrheic keratosis: Secondary | ICD-10-CM | POA: Diagnosis not present

## 2022-08-17 DIAGNOSIS — L853 Xerosis cutis: Secondary | ICD-10-CM | POA: Diagnosis not present

## 2022-08-17 DIAGNOSIS — L570 Actinic keratosis: Secondary | ICD-10-CM | POA: Diagnosis not present

## 2022-08-27 DIAGNOSIS — H0102A Squamous blepharitis right eye, upper and lower eyelids: Secondary | ICD-10-CM | POA: Diagnosis not present

## 2022-08-27 DIAGNOSIS — H16143 Punctate keratitis, bilateral: Secondary | ICD-10-CM | POA: Diagnosis not present

## 2022-08-27 DIAGNOSIS — H0102B Squamous blepharitis left eye, upper and lower eyelids: Secondary | ICD-10-CM | POA: Diagnosis not present

## 2022-08-27 DIAGNOSIS — H0222A Mechanical lagophthalmos right eye, upper and lower eyelids: Secondary | ICD-10-CM | POA: Diagnosis not present

## 2022-08-27 DIAGNOSIS — H16223 Keratoconjunctivitis sicca, not specified as Sjogren's, bilateral: Secondary | ICD-10-CM | POA: Diagnosis not present

## 2022-08-27 DIAGNOSIS — H16211 Exposure keratoconjunctivitis, right eye: Secondary | ICD-10-CM | POA: Diagnosis not present

## 2022-08-27 DIAGNOSIS — H0288B Meibomian gland dysfunction left eye, upper and lower eyelids: Secondary | ICD-10-CM | POA: Diagnosis not present

## 2022-08-27 DIAGNOSIS — H0288A Meibomian gland dysfunction right eye, upper and lower eyelids: Secondary | ICD-10-CM | POA: Diagnosis not present

## 2022-08-27 DIAGNOSIS — H0279 Other degenerative disorders of eyelid and periocular area: Secondary | ICD-10-CM | POA: Diagnosis not present

## 2022-09-08 DIAGNOSIS — E785 Hyperlipidemia, unspecified: Secondary | ICD-10-CM | POA: Diagnosis not present

## 2022-09-08 DIAGNOSIS — I10 Essential (primary) hypertension: Secondary | ICD-10-CM | POA: Diagnosis not present

## 2022-09-08 DIAGNOSIS — G720 Drug-induced myopathy: Secondary | ICD-10-CM | POA: Diagnosis not present

## 2022-09-10 DIAGNOSIS — M542 Cervicalgia: Secondary | ICD-10-CM | POA: Diagnosis not present

## 2022-09-10 DIAGNOSIS — R202 Paresthesia of skin: Secondary | ICD-10-CM | POA: Diagnosis not present

## 2022-09-10 DIAGNOSIS — Z79899 Other long term (current) drug therapy: Secondary | ICD-10-CM | POA: Diagnosis not present

## 2022-09-10 DIAGNOSIS — M79604 Pain in right leg: Secondary | ICD-10-CM | POA: Diagnosis not present

## 2022-09-10 DIAGNOSIS — M25511 Pain in right shoulder: Secondary | ICD-10-CM | POA: Diagnosis not present

## 2022-09-10 DIAGNOSIS — Z79891 Long term (current) use of opiate analgesic: Secondary | ICD-10-CM | POA: Diagnosis not present

## 2022-09-10 DIAGNOSIS — M25512 Pain in left shoulder: Secondary | ICD-10-CM | POA: Diagnosis not present

## 2022-09-10 DIAGNOSIS — M25561 Pain in right knee: Secondary | ICD-10-CM | POA: Diagnosis not present

## 2022-09-10 DIAGNOSIS — M25572 Pain in left ankle and joints of left foot: Secondary | ICD-10-CM | POA: Diagnosis not present

## 2022-09-10 DIAGNOSIS — G894 Chronic pain syndrome: Secondary | ICD-10-CM | POA: Diagnosis not present

## 2022-09-10 DIAGNOSIS — M545 Low back pain, unspecified: Secondary | ICD-10-CM | POA: Diagnosis not present

## 2022-09-10 DIAGNOSIS — M25571 Pain in right ankle and joints of right foot: Secondary | ICD-10-CM | POA: Diagnosis not present

## 2022-09-13 DIAGNOSIS — I890 Lymphedema, not elsewhere classified: Secondary | ICD-10-CM | POA: Diagnosis not present

## 2022-09-13 DIAGNOSIS — I10 Essential (primary) hypertension: Secondary | ICD-10-CM | POA: Diagnosis not present

## 2022-09-13 DIAGNOSIS — N1831 Chronic kidney disease, stage 3a: Secondary | ICD-10-CM | POA: Diagnosis not present

## 2022-09-13 DIAGNOSIS — F3130 Bipolar disorder, current episode depressed, mild or moderate severity, unspecified: Secondary | ICD-10-CM | POA: Diagnosis not present

## 2022-09-13 DIAGNOSIS — Z6835 Body mass index (BMI) 35.0-35.9, adult: Secondary | ICD-10-CM | POA: Diagnosis not present

## 2022-09-13 DIAGNOSIS — K5900 Constipation, unspecified: Secondary | ICD-10-CM | POA: Diagnosis not present

## 2022-09-17 DIAGNOSIS — Z9889 Other specified postprocedural states: Secondary | ICD-10-CM | POA: Diagnosis not present

## 2022-09-17 DIAGNOSIS — H179 Unspecified corneal scar and opacity: Secondary | ICD-10-CM | POA: Diagnosis not present

## 2022-09-17 DIAGNOSIS — Z961 Presence of intraocular lens: Secondary | ICD-10-CM | POA: Diagnosis not present

## 2022-09-17 DIAGNOSIS — H168 Other keratitis: Secondary | ICD-10-CM | POA: Diagnosis not present

## 2022-09-20 DIAGNOSIS — H16223 Keratoconjunctivitis sicca, not specified as Sjogren's, bilateral: Secondary | ICD-10-CM | POA: Diagnosis not present

## 2022-09-20 DIAGNOSIS — H16143 Punctate keratitis, bilateral: Secondary | ICD-10-CM | POA: Diagnosis not present

## 2022-09-20 DIAGNOSIS — H0222A Mechanical lagophthalmos right eye, upper and lower eyelids: Secondary | ICD-10-CM | POA: Diagnosis not present

## 2022-09-20 DIAGNOSIS — H0279 Other degenerative disorders of eyelid and periocular area: Secondary | ICD-10-CM | POA: Diagnosis not present

## 2022-09-20 DIAGNOSIS — H16211 Exposure keratoconjunctivitis, right eye: Secondary | ICD-10-CM | POA: Diagnosis not present

## 2022-09-22 DIAGNOSIS — H16211 Exposure keratoconjunctivitis, right eye: Secondary | ICD-10-CM | POA: Diagnosis not present

## 2022-09-22 DIAGNOSIS — H16143 Punctate keratitis, bilateral: Secondary | ICD-10-CM | POA: Diagnosis not present

## 2022-09-22 DIAGNOSIS — H0279 Other degenerative disorders of eyelid and periocular area: Secondary | ICD-10-CM | POA: Diagnosis not present

## 2022-09-22 DIAGNOSIS — H0222A Mechanical lagophthalmos right eye, upper and lower eyelids: Secondary | ICD-10-CM | POA: Diagnosis not present

## 2022-09-22 DIAGNOSIS — H16223 Keratoconjunctivitis sicca, not specified as Sjogren's, bilateral: Secondary | ICD-10-CM | POA: Diagnosis not present

## 2022-09-27 DIAGNOSIS — H0279 Other degenerative disorders of eyelid and periocular area: Secondary | ICD-10-CM | POA: Diagnosis not present

## 2022-09-27 DIAGNOSIS — H16211 Exposure keratoconjunctivitis, right eye: Secondary | ICD-10-CM | POA: Diagnosis not present

## 2022-09-27 DIAGNOSIS — H0222A Mechanical lagophthalmos right eye, upper and lower eyelids: Secondary | ICD-10-CM | POA: Diagnosis not present

## 2022-09-27 DIAGNOSIS — H16143 Punctate keratitis, bilateral: Secondary | ICD-10-CM | POA: Diagnosis not present

## 2022-09-27 DIAGNOSIS — H16223 Keratoconjunctivitis sicca, not specified as Sjogren's, bilateral: Secondary | ICD-10-CM | POA: Diagnosis not present

## 2022-09-30 DIAGNOSIS — H0279 Other degenerative disorders of eyelid and periocular area: Secondary | ICD-10-CM | POA: Diagnosis not present

## 2022-09-30 DIAGNOSIS — H16211 Exposure keratoconjunctivitis, right eye: Secondary | ICD-10-CM | POA: Diagnosis not present

## 2022-09-30 DIAGNOSIS — H16223 Keratoconjunctivitis sicca, not specified as Sjogren's, bilateral: Secondary | ICD-10-CM | POA: Diagnosis not present

## 2022-09-30 DIAGNOSIS — H16143 Punctate keratitis, bilateral: Secondary | ICD-10-CM | POA: Diagnosis not present

## 2022-09-30 DIAGNOSIS — H0222A Mechanical lagophthalmos right eye, upper and lower eyelids: Secondary | ICD-10-CM | POA: Diagnosis not present

## 2022-10-12 DIAGNOSIS — K5900 Constipation, unspecified: Secondary | ICD-10-CM | POA: Diagnosis not present

## 2022-10-12 DIAGNOSIS — F3130 Bipolar disorder, current episode depressed, mild or moderate severity, unspecified: Secondary | ICD-10-CM | POA: Diagnosis not present

## 2022-10-12 DIAGNOSIS — I890 Lymphedema, not elsewhere classified: Secondary | ICD-10-CM | POA: Diagnosis not present

## 2022-10-12 DIAGNOSIS — Z6834 Body mass index (BMI) 34.0-34.9, adult: Secondary | ICD-10-CM | POA: Diagnosis not present

## 2022-10-12 DIAGNOSIS — N1831 Chronic kidney disease, stage 3a: Secondary | ICD-10-CM | POA: Diagnosis not present

## 2022-10-12 DIAGNOSIS — I10 Essential (primary) hypertension: Secondary | ICD-10-CM | POA: Diagnosis not present

## 2022-10-18 DIAGNOSIS — H16211 Exposure keratoconjunctivitis, right eye: Secondary | ICD-10-CM | POA: Diagnosis not present

## 2022-10-18 DIAGNOSIS — H0222A Mechanical lagophthalmos right eye, upper and lower eyelids: Secondary | ICD-10-CM | POA: Diagnosis not present

## 2022-10-18 DIAGNOSIS — H0279 Other degenerative disorders of eyelid and periocular area: Secondary | ICD-10-CM | POA: Diagnosis not present

## 2022-10-18 DIAGNOSIS — H16143 Punctate keratitis, bilateral: Secondary | ICD-10-CM | POA: Diagnosis not present

## 2022-10-18 DIAGNOSIS — H16223 Keratoconjunctivitis sicca, not specified as Sjogren's, bilateral: Secondary | ICD-10-CM | POA: Diagnosis not present

## 2022-10-20 DIAGNOSIS — H16143 Punctate keratitis, bilateral: Secondary | ICD-10-CM | POA: Diagnosis not present

## 2022-10-20 DIAGNOSIS — H0279 Other degenerative disorders of eyelid and periocular area: Secondary | ICD-10-CM | POA: Diagnosis not present

## 2022-10-20 DIAGNOSIS — H16211 Exposure keratoconjunctivitis, right eye: Secondary | ICD-10-CM | POA: Diagnosis not present

## 2022-10-20 DIAGNOSIS — H16223 Keratoconjunctivitis sicca, not specified as Sjogren's, bilateral: Secondary | ICD-10-CM | POA: Diagnosis not present

## 2022-10-20 DIAGNOSIS — H0222A Mechanical lagophthalmos right eye, upper and lower eyelids: Secondary | ICD-10-CM | POA: Diagnosis not present

## 2022-10-25 DIAGNOSIS — H0279 Other degenerative disorders of eyelid and periocular area: Secondary | ICD-10-CM | POA: Diagnosis not present

## 2022-10-25 DIAGNOSIS — H02532 Eyelid retraction right lower eyelid: Secondary | ICD-10-CM | POA: Diagnosis not present

## 2022-10-25 DIAGNOSIS — H04521 Eversion of right lacrimal punctum: Secondary | ICD-10-CM | POA: Diagnosis not present

## 2022-10-25 DIAGNOSIS — H02423 Myogenic ptosis of bilateral eyelids: Secondary | ICD-10-CM | POA: Diagnosis not present

## 2022-10-25 DIAGNOSIS — H57813 Brow ptosis, bilateral: Secondary | ICD-10-CM | POA: Diagnosis not present

## 2022-10-25 DIAGNOSIS — H16211 Exposure keratoconjunctivitis, right eye: Secondary | ICD-10-CM | POA: Diagnosis not present

## 2022-10-25 DIAGNOSIS — H02112 Cicatricial ectropion of right lower eyelid: Secondary | ICD-10-CM | POA: Diagnosis not present

## 2022-10-25 DIAGNOSIS — H02132 Senile ectropion of right lower eyelid: Secondary | ICD-10-CM | POA: Diagnosis not present

## 2022-10-25 DIAGNOSIS — H04123 Dry eye syndrome of bilateral lacrimal glands: Secondary | ICD-10-CM | POA: Diagnosis not present

## 2022-11-09 DIAGNOSIS — K5900 Constipation, unspecified: Secondary | ICD-10-CM | POA: Diagnosis not present

## 2022-11-09 DIAGNOSIS — N1831 Chronic kidney disease, stage 3a: Secondary | ICD-10-CM | POA: Diagnosis not present

## 2022-11-09 DIAGNOSIS — I10 Essential (primary) hypertension: Secondary | ICD-10-CM | POA: Diagnosis not present

## 2022-11-09 DIAGNOSIS — I890 Lymphedema, not elsewhere classified: Secondary | ICD-10-CM | POA: Diagnosis not present

## 2022-11-09 DIAGNOSIS — Z6834 Body mass index (BMI) 34.0-34.9, adult: Secondary | ICD-10-CM | POA: Diagnosis not present

## 2022-11-09 DIAGNOSIS — F3130 Bipolar disorder, current episode depressed, mild or moderate severity, unspecified: Secondary | ICD-10-CM | POA: Diagnosis not present

## 2022-11-12 DIAGNOSIS — M25511 Pain in right shoulder: Secondary | ICD-10-CM | POA: Diagnosis not present

## 2022-11-12 DIAGNOSIS — M79604 Pain in right leg: Secondary | ICD-10-CM | POA: Diagnosis not present

## 2022-11-12 DIAGNOSIS — M25571 Pain in right ankle and joints of right foot: Secondary | ICD-10-CM | POA: Diagnosis not present

## 2022-11-12 DIAGNOSIS — G894 Chronic pain syndrome: Secondary | ICD-10-CM | POA: Diagnosis not present

## 2022-11-12 DIAGNOSIS — F1120 Opioid dependence, uncomplicated: Secondary | ICD-10-CM | POA: Diagnosis not present

## 2022-11-12 DIAGNOSIS — M545 Low back pain, unspecified: Secondary | ICD-10-CM | POA: Diagnosis not present

## 2022-11-12 DIAGNOSIS — M25572 Pain in left ankle and joints of left foot: Secondary | ICD-10-CM | POA: Diagnosis not present

## 2022-11-12 DIAGNOSIS — M25512 Pain in left shoulder: Secondary | ICD-10-CM | POA: Diagnosis not present

## 2022-11-12 DIAGNOSIS — M542 Cervicalgia: Secondary | ICD-10-CM | POA: Diagnosis not present

## 2022-11-12 DIAGNOSIS — R202 Paresthesia of skin: Secondary | ICD-10-CM | POA: Diagnosis not present

## 2022-11-12 DIAGNOSIS — Z79891 Long term (current) use of opiate analgesic: Secondary | ICD-10-CM | POA: Diagnosis not present

## 2022-11-15 DIAGNOSIS — H53483 Generalized contraction of visual field, bilateral: Secondary | ICD-10-CM | POA: Diagnosis not present

## 2022-12-03 DIAGNOSIS — H16211 Exposure keratoconjunctivitis, right eye: Secondary | ICD-10-CM | POA: Diagnosis not present

## 2022-12-03 DIAGNOSIS — H0222A Mechanical lagophthalmos right eye, upper and lower eyelids: Secondary | ICD-10-CM | POA: Diagnosis not present

## 2022-12-03 DIAGNOSIS — H0279 Other degenerative disorders of eyelid and periocular area: Secondary | ICD-10-CM | POA: Diagnosis not present

## 2022-12-03 DIAGNOSIS — H16143 Punctate keratitis, bilateral: Secondary | ICD-10-CM | POA: Diagnosis not present

## 2022-12-03 DIAGNOSIS — H16223 Keratoconjunctivitis sicca, not specified as Sjogren's, bilateral: Secondary | ICD-10-CM | POA: Diagnosis not present

## 2022-12-09 DIAGNOSIS — N1831 Chronic kidney disease, stage 3a: Secondary | ICD-10-CM | POA: Diagnosis not present

## 2022-12-09 DIAGNOSIS — Z6833 Body mass index (BMI) 33.0-33.9, adult: Secondary | ICD-10-CM | POA: Diagnosis not present

## 2022-12-09 DIAGNOSIS — F3130 Bipolar disorder, current episode depressed, mild or moderate severity, unspecified: Secondary | ICD-10-CM | POA: Diagnosis not present

## 2022-12-09 DIAGNOSIS — I890 Lymphedema, not elsewhere classified: Secondary | ICD-10-CM | POA: Diagnosis not present

## 2022-12-09 DIAGNOSIS — K5900 Constipation, unspecified: Secondary | ICD-10-CM | POA: Diagnosis not present

## 2022-12-09 DIAGNOSIS — I10 Essential (primary) hypertension: Secondary | ICD-10-CM | POA: Diagnosis not present

## 2023-01-04 DIAGNOSIS — Z1231 Encounter for screening mammogram for malignant neoplasm of breast: Secondary | ICD-10-CM | POA: Diagnosis not present

## 2023-01-17 DIAGNOSIS — H16211 Exposure keratoconjunctivitis, right eye: Secondary | ICD-10-CM | POA: Diagnosis not present

## 2023-01-17 DIAGNOSIS — H02413 Mechanical ptosis of bilateral eyelids: Secondary | ICD-10-CM | POA: Diagnosis not present

## 2023-01-17 DIAGNOSIS — H02132 Senile ectropion of right lower eyelid: Secondary | ICD-10-CM | POA: Diagnosis not present

## 2023-01-17 DIAGNOSIS — H04123 Dry eye syndrome of bilateral lacrimal glands: Secondary | ICD-10-CM | POA: Diagnosis not present

## 2023-01-17 DIAGNOSIS — H04521 Eversion of right lacrimal punctum: Secondary | ICD-10-CM | POA: Diagnosis not present

## 2023-01-17 DIAGNOSIS — H02112 Cicatricial ectropion of right lower eyelid: Secondary | ICD-10-CM | POA: Diagnosis not present

## 2023-01-17 DIAGNOSIS — H57813 Brow ptosis, bilateral: Secondary | ICD-10-CM | POA: Diagnosis not present

## 2023-01-17 DIAGNOSIS — H02423 Myogenic ptosis of bilateral eyelids: Secondary | ICD-10-CM | POA: Diagnosis not present

## 2023-01-17 DIAGNOSIS — H02532 Eyelid retraction right lower eyelid: Secondary | ICD-10-CM | POA: Diagnosis not present

## 2023-01-27 DIAGNOSIS — Z0000 Encounter for general adult medical examination without abnormal findings: Secondary | ICD-10-CM | POA: Diagnosis not present

## 2023-01-27 DIAGNOSIS — D508 Other iron deficiency anemias: Secondary | ICD-10-CM | POA: Diagnosis not present

## 2023-01-27 DIAGNOSIS — E785 Hyperlipidemia, unspecified: Secondary | ICD-10-CM | POA: Diagnosis not present

## 2023-01-27 DIAGNOSIS — F3130 Bipolar disorder, current episode depressed, mild or moderate severity, unspecified: Secondary | ICD-10-CM | POA: Diagnosis not present

## 2023-01-27 DIAGNOSIS — E782 Mixed hyperlipidemia: Secondary | ICD-10-CM | POA: Diagnosis not present

## 2023-01-27 DIAGNOSIS — G720 Drug-induced myopathy: Secondary | ICD-10-CM | POA: Diagnosis not present

## 2023-01-27 DIAGNOSIS — Z131 Encounter for screening for diabetes mellitus: Secondary | ICD-10-CM | POA: Diagnosis not present

## 2023-01-27 DIAGNOSIS — I10 Essential (primary) hypertension: Secondary | ICD-10-CM | POA: Diagnosis not present

## 2023-01-31 DIAGNOSIS — I890 Lymphedema, not elsewhere classified: Secondary | ICD-10-CM | POA: Diagnosis not present

## 2023-01-31 DIAGNOSIS — I10 Essential (primary) hypertension: Secondary | ICD-10-CM | POA: Diagnosis not present

## 2023-01-31 DIAGNOSIS — N1831 Chronic kidney disease, stage 3a: Secondary | ICD-10-CM | POA: Diagnosis not present

## 2023-01-31 DIAGNOSIS — K5900 Constipation, unspecified: Secondary | ICD-10-CM | POA: Diagnosis not present

## 2023-01-31 DIAGNOSIS — Z6833 Body mass index (BMI) 33.0-33.9, adult: Secondary | ICD-10-CM | POA: Diagnosis not present

## 2023-01-31 DIAGNOSIS — F3130 Bipolar disorder, current episode depressed, mild or moderate severity, unspecified: Secondary | ICD-10-CM | POA: Diagnosis not present

## 2023-02-08 DIAGNOSIS — M545 Low back pain, unspecified: Secondary | ICD-10-CM | POA: Diagnosis not present

## 2023-02-08 DIAGNOSIS — G894 Chronic pain syndrome: Secondary | ICD-10-CM | POA: Diagnosis not present

## 2023-02-08 DIAGNOSIS — M79604 Pain in right leg: Secondary | ICD-10-CM | POA: Diagnosis not present

## 2023-02-08 DIAGNOSIS — M25572 Pain in left ankle and joints of left foot: Secondary | ICD-10-CM | POA: Diagnosis not present

## 2023-02-08 DIAGNOSIS — M25561 Pain in right knee: Secondary | ICD-10-CM | POA: Diagnosis not present

## 2023-02-08 DIAGNOSIS — Z79891 Long term (current) use of opiate analgesic: Secondary | ICD-10-CM | POA: Diagnosis not present

## 2023-02-11 DIAGNOSIS — H16223 Keratoconjunctivitis sicca, not specified as Sjogren's, bilateral: Secondary | ICD-10-CM | POA: Diagnosis not present

## 2023-02-11 DIAGNOSIS — H1789 Other corneal scars and opacities: Secondary | ICD-10-CM | POA: Diagnosis not present

## 2023-02-11 DIAGNOSIS — H52203 Unspecified astigmatism, bilateral: Secondary | ICD-10-CM | POA: Diagnosis not present

## 2023-03-03 DIAGNOSIS — F3130 Bipolar disorder, current episode depressed, mild or moderate severity, unspecified: Secondary | ICD-10-CM | POA: Diagnosis not present

## 2023-03-03 DIAGNOSIS — N1831 Chronic kidney disease, stage 3a: Secondary | ICD-10-CM | POA: Diagnosis not present

## 2023-03-03 DIAGNOSIS — I890 Lymphedema, not elsewhere classified: Secondary | ICD-10-CM | POA: Diagnosis not present

## 2023-03-03 DIAGNOSIS — I10 Essential (primary) hypertension: Secondary | ICD-10-CM | POA: Diagnosis not present

## 2023-03-03 DIAGNOSIS — Z6833 Body mass index (BMI) 33.0-33.9, adult: Secondary | ICD-10-CM | POA: Diagnosis not present

## 2023-03-03 DIAGNOSIS — K5900 Constipation, unspecified: Secondary | ICD-10-CM | POA: Diagnosis not present

## 2023-03-28 ENCOUNTER — Other Ambulatory Visit (HOSPITAL_BASED_OUTPATIENT_CLINIC_OR_DEPARTMENT_OTHER): Payer: Self-pay

## 2023-03-30 DIAGNOSIS — M170 Bilateral primary osteoarthritis of knee: Secondary | ICD-10-CM | POA: Diagnosis not present

## 2023-04-04 ENCOUNTER — Telehealth: Payer: Self-pay | Admitting: Emergency Medicine

## 2023-04-04 NOTE — Telephone Encounter (Signed)
 Fax received from Dr. Teryl Lucy with [facility] to perform a left total knee arthroplasty on patient under spinal anesthesia Patient needs surgery clearance. Surgery is pending. Patient was seen on 10/05/19. Office protocol is a risk assessment can be sent to surgeon if patient has been seen in 60 days or less.   Pt needs appt to re est care with any pulmonary doc to have risk assessment done   I called her and there was no answer- LMTCB.

## 2023-04-08 NOTE — Telephone Encounter (Signed)
 Appt with MR is scheduled for 04/25/23

## 2023-04-18 ENCOUNTER — Other Ambulatory Visit (HOSPITAL_BASED_OUTPATIENT_CLINIC_OR_DEPARTMENT_OTHER): Payer: Self-pay

## 2023-04-18 DIAGNOSIS — Z6833 Body mass index (BMI) 33.0-33.9, adult: Secondary | ICD-10-CM | POA: Diagnosis not present

## 2023-04-18 DIAGNOSIS — N1831 Chronic kidney disease, stage 3a: Secondary | ICD-10-CM | POA: Diagnosis not present

## 2023-04-18 DIAGNOSIS — R29898 Other symptoms and signs involving the musculoskeletal system: Secondary | ICD-10-CM | POA: Diagnosis not present

## 2023-04-18 DIAGNOSIS — R202 Paresthesia of skin: Secondary | ICD-10-CM | POA: Diagnosis not present

## 2023-04-18 DIAGNOSIS — I890 Lymphedema, not elsewhere classified: Secondary | ICD-10-CM | POA: Diagnosis not present

## 2023-04-18 DIAGNOSIS — I10 Essential (primary) hypertension: Secondary | ICD-10-CM | POA: Diagnosis not present

## 2023-04-18 DIAGNOSIS — F3130 Bipolar disorder, current episode depressed, mild or moderate severity, unspecified: Secondary | ICD-10-CM | POA: Diagnosis not present

## 2023-04-18 DIAGNOSIS — K5900 Constipation, unspecified: Secondary | ICD-10-CM | POA: Diagnosis not present

## 2023-04-18 MED ORDER — WEGOVY 2.4 MG/0.75ML ~~LOC~~ SOAJ
2.4000 mg | SUBCUTANEOUS | 0 refills | Status: DC
  Filled 2023-04-18: qty 3, 28d supply, fill #0

## 2023-04-19 ENCOUNTER — Other Ambulatory Visit (HOSPITAL_BASED_OUTPATIENT_CLINIC_OR_DEPARTMENT_OTHER): Payer: Self-pay

## 2023-04-25 ENCOUNTER — Other Ambulatory Visit (HOSPITAL_BASED_OUTPATIENT_CLINIC_OR_DEPARTMENT_OTHER): Payer: Self-pay

## 2023-04-25 ENCOUNTER — Ambulatory Visit: Payer: Medicare PPO | Admitting: Internal Medicine

## 2023-04-25 ENCOUNTER — Encounter: Payer: Self-pay | Admitting: Internal Medicine

## 2023-04-25 VITALS — BP 129/73 | HR 63 | Temp 97.8°F | Ht 61.0 in | Wt 183.0 lb

## 2023-04-25 DIAGNOSIS — Z7409 Other reduced mobility: Secondary | ICD-10-CM

## 2023-04-25 DIAGNOSIS — Z87891 Personal history of nicotine dependence: Secondary | ICD-10-CM

## 2023-04-25 DIAGNOSIS — Z01811 Encounter for preprocedural respiratory examination: Secondary | ICD-10-CM | POA: Diagnosis not present

## 2023-04-25 DIAGNOSIS — R0609 Other forms of dyspnea: Secondary | ICD-10-CM

## 2023-04-25 NOTE — Patient Instructions (Addendum)
 ICD-10-CM   1. Preop respiratory exam  Z01.811 CT Chest High Resolution    Pulse oximetry, overnight    Pulmonary function test    ECHOCARDIOGRAM COMPLETE    2. Stopped smoking with greater than 40 pack year history  Z87.891 CT Chest High Resolution    Pulse oximetry, overnight    Pulmonary function test    ECHOCARDIOGRAM COMPLETE    3. DOE (dyspnea on exertion)  R06.09 CT Chest High Resolution    Pulse oximetry, overnight    Pulmonary function test    ECHOCARDIOGRAM COMPLETE    4. Immobility  Z74.09 CT Chest High Resolution    Pulse oximetry, overnight    Pulmonary function test    ECHOCARDIOGRAM COMPLETE     Appears most of the risk for surgery seems to be coming from immobility and physical deconditioning and less from your lungs but will make further refined assessment after getting data  Plan  - get PFT spirometry only  - get HRCT  - get ONO  - get ECHO  FOllowup /Nurse practitioner in 8 weeks.

## 2023-04-25 NOTE — Progress Notes (Signed)
 78 yo former smoker, hx documented allergies and rhinitis, chronic cough.   ROV 08/31/17 --78 year old woman with a history of chronic upper airway irritation syndrome and chronic cough, severe rhinitis and esophageal reflux.  I also followed her for chronic progressive exertional dyspnea.  She has grossly normal airflows but with some possible mild curve to her flow volume loop.  She is been tried on bronchodilators in the past without much improvement.  She been managed on a good allergy regimen and acid suppression.  We performed a ventilation/perfusion scan 04/19/2017 that was low probability for PE.  She reports that she has been experiencing continued exertional SOB. She did do Fe infusions - seemed to help her some. She cannot walk across the room without SOB. Her cough is better, fewer throat symptoms. She rarely uses albuterol, but sometimes with a longer walk. She was tried on trelegy by Dr Conley Rolls > no benefit, had to stop because she could not tolerate powder. She is interested in starting an alternative BD if possible.   She is on flonase, loratadine, nexium and omeprazole   ROV 10/05/19 --78 year old woman (former smoker, 50 pack years) with probable mild obstructive lung disease but overall normal spirometry, chronic upper irritation syndrome and chronic cough in the setting of severe rhinitis, GERD. She has lumbar DJD, chronic knee pain.  She has chronic and progressive dyspnea. She has dyspnea with walking short distances. Has some daily cough. She has not tolerated powdered inhalers. We have tried Stiolto in the past. Currently on Flonase twice daily, Nexium daily, omeprazole nightly. Not currently on albuterol or any BD.  She is being evaluated for surgical replacement of spinal stimulator.   OV 04/25/2023 -   Subjective:  Patient ID: [name], female , DOB: 08-05-1945 , age 78 y.o. , MRN: [id number] , ADDRESS: [address] [place] [address] PCP Koren Shiver,  DO Patient Care Team: Koren Shiver, DO as PCP - General (Family Medicine)  This Provider for this visit: Treatment Team:  Attending Provider: Kalman Shan, MD    04/25/2023 -   Chief Complaint  Patient presents with   Pulmonary Consult    Seen here for COPD by Dr Delton Coombes- last in 2019. She is needing risk assessment for total left knee replacement. Breathing has improved with recent weight loss. She does get winded with exertion such as taking a shower and getting dressed. She has occ cough with yellow to clear sputum.      HPI [name] 78 y.o. -as is a new consult.  She is a former patient of Dr. Delton Coombes but it has been more than 3 years since she saw him there for the new evaluation.  She is here for preoperative consultation.  This because she needs bilateral total knee replacement because of severe pain and Dr. Dion Saucier was requested consultation.  She is a former smoker having smoked greater than 50 pack but quit more than 30 years ago.  She suffers from morbid obesity but in the last 2 years has lost more than 90 pounds of weight with the help of diet and also Wegovy through the [facility].  She last saw Dr. Delton Coombes in 2017.  She said her weight loss started after her parents died within 4 months of each other in early 2023.  With the weight loss there is less shortness of breath with exertion and also cough.  She says also at baseline that she is pretty much immobile or  very minimally mobile and being on the wheelchair.  This because 5 or 6 years ago she suffered from ankle fracture and required a plate.  She also has lumbar spinal surgery.  After that there has been significant physical deconditioning.  She is barely able to weight-bear.  She can hold herself for a brief bit using support but all her ADLs her support of changing clothes taking a shower is supported by her wife [place].  There is no wheezing.  The purpose of her knee replacement is to get rid of severe  chronic pain.  Last CT scan chest 2017 Last echocardiogram 2018 She does not think she has sleep apnea or snores with apneic spells.  But she is willing to get a overnight pulse oximetry study.    CT Chest data from date:   - personally visualized and independently interpreted :  NO - my findings are: as below  IMPRESSION: 1. The right middle lobe subpleural nodule is essentially stable from 2011 and accordingly considered benign. 2. Minimal enlargement of several fluid density lesions in the liver, probably cysts. 3. Dorsal column stimulator leads are centered at the T8 level. 4. Right shoulder arthropathy with degenerative glenohumeral arthropathy, fluid in the subcoracoid bursa, and atrophy of the right infraspinatus and subscapularis muscles. 5. Chronic mild enlargement of a right hilar lymph node at 1.2 cm in short axis.     Electronically Signed   By: Gaylyn Rong M.D.   On: 10/16/2015 19:35  PFT     Latest Ref Rng & Units 04/13/2017    1:42 PM  PFT Results  FVC-Pre L 2.48   FVC-Predicted Pre % 88   FVC-Post L 2.32   FVC-Predicted Post % 82   Pre FEV1/FVC % % 76   Post FEV1/FCV % % 79   FEV1-Pre L 1.89   FEV1-Predicted Pre % 89   FEV1-Post L 1.83   DLCO uncorrected ml/min/mmHg 15.56   DLCO UNC% % 69   DLCO corrected ml/min/mmHg 17.40   DLCO COR %Predicted % 78   DLVA Predicted % 88   TLC L 4.70   TLC % Predicted % 97   RV % Predicted % 92        LAB RESULTS last 96 hours No results found.       has a past medical history of Anxiety, Asthma, Bipolar affective disorder (HCC), COPD (chronic obstructive pulmonary disease) (HCC), Cough, Depression, DJD (degenerative joint disease) of lumbar spine, DVT (deep venous thrombosis) (HCC) (2010), Family history of anesthesia complication, Fibromyalgia, GERD (gastroesophageal reflux disease), Left leg DVT (HCC) (2010), Lymphedema (2019), Migraine, Pneumonia, PONV (postoperative nausea and vomiting),  Shortness of breath, Spinal cord stimulator dysfunction (HCC), Spinal headache, Vertigo, and Vocal cord dysfunction.   reports that she quit smoking about 36 years ago. Her smoking use included cigarettes. She started smoking about 57 years ago. She has a 52.5 pack-year smoking history. She has never used smokeless tobacco.  Past Surgical History:  Procedure Laterality Date   ABDOMINAL HYSTERECTOMY  1980   partial    ANTERIOR LAT LUMBAR FUSION Left 02/15/2014   Procedure: ANTERIOR LATERAL LUMBAR INTERBODY FUSION LUMBAR TWO-THREE,LUMBAR THREE-FOUR WITH LATERAL PLATE.;  Surgeon: Temple Pacini, MD;  Location: [facility];  Service: Neurosurgery;  Laterality: Left;  left    APPENDECTOMY     BACK SURGERY     BREAST ENHANCEMENT SURGERY  1989   CARPAL TUNNEL RELEASE  2006   Right hand   CATARACT EXTRACTION W/  INTRAOCULAR LENS  IMPLANT, BILATERAL     Hx: of   CERVICAL FUSION  2005   CHOLECYSTECTOMY  1989   COLONOSCOPY W/ BIOPSIES AND POLYPECTOMY     Hx: of   FINGER ARTHROSCOPY WITH CARPOMETACARPEL ([facility]) ARTHROPLASTY     thumb   FRACTURE SURGERY Right 09/2013   ankle and leg - plate and screws   LUMBAR LAMINECTOMY/DECOMPRESSION MICRODISCECTOMY Right 11/06/2012   Procedure: LUMBAR TWO THREE LUMBAR LAMINECTOMY/DECOMPRESSION MICRODISCECTOMY 1 LEVEL;  Surgeon: Temple Pacini, MD;  Location: [facility];  Service: Neurosurgery;  Laterality: Right;   OPEN REDUCTION INTERNAL FIXATION (ORIF) DISTAL RADIAL FRACTURE Right 05/05/2018   Procedure: OPEN REDUCTION INTERNAL FIXATION (ORIF) DISTAL RADIAL FRACTURE;  Surgeon: Dairl Ponder, MD;  Location: [facility];  Service: Orthopedics;  Laterality: Right;   ORIF ANKLE FRACTURE Right 2015   ORIF TIBIA FRACTURE Right 2015   PARTIAL HYSTERECTOMY  1980   ROTATOR CUFF REPAIR Right 2017   SHOULDER ARTHROSCOPY     SPINAL CORD STIMULATOR BATTERY EXCHANGE Right 09/09/2017   Procedure: SPINAL CORD STIMULATOR BATTERY EXCHANGE;  Surgeon: Odette Fraction,  MD;  Location: [facility];  Service: Neurosurgery;  Laterality: Right;  SPINAL CORD STIMULATOR BATTERY EXCHANGE   SPINAL CORD STIMULATOR IMPLANT  2010   SPINAL FUSION  2018   L1, L2   TARSAL SUSPENSION     Hx; of left thumb   TONSILLECTOMY  1955   UPPER GI ENDOSCOPY     Hx: of    Allergies  Allergen Reactions   Lisinopril Other (See Comments)    kidney failure   Lithium Other (See Comments)    Migraines and "drunk"   Statins Other (See Comments)    Very weak and achy   Adhesive [Tape] Rash    All tape, paper and steri-strips included   Codeine Nausea And Vomiting   Scopolamine Other (See Comments)    Causes vertigo    Immunization History  Administered Date(s) Administered   Influenza Split 02/09/2012, 10/03/2017, 11/08/2019   Influenza Whole 02/08/2009, 11/08/2009   Influenza, High Dose Seasonal PF 12/14/2013, 11/27/2014, 11/04/2015, 11/09/2016, 11/09/2022   Influenza-Unspecified 10/20/2012, 02/22/2013, 10/12/2016   PFIZER(Purple Top)SARS-COV-2 Vaccination 03/23/2019, 04/17/2019, 10/19/2019, 05/12/2020   PNEUMOCOCCAL CONJUGATE-20 08/26/2021   Pfizer Covid-19 Vaccine Bivalent Booster 57yrs & up 10/28/2020   Pneumococcal Conjugate-13 04/27/2013, 11/21/2015   Pneumococcal Polysaccharide-23 11/08/2008, 02/08/2009, 12/08/2016   Pneumococcal-Unspecified 10/09/2009   Td 10/13/2001, 07/09/2011   Tdap 02/17/2021   Zoster, Live 12/16/2010    Family History  Problem Relation Age of Onset   Lung cancer Mother    Prostate cancer Father    Heart disease Father    Rheum arthritis Paternal Grandmother    Asthma Sister      Current Outpatient Medications:    albuterol (VENTOLIN HFA) 108 (90 Base) MCG/ACT inhaler, INHALE 2 PUFFS EVERY 6 HOURS AS NEEDED FOR WHEEZING OR SHORTNESS OF BREATH., Disp: 18 g, Rfl: 6   ALPRAZolam (XANAX) 1 MG tablet, Take 2 mg by mouth at bedtime. May take a dose of 1 mg during the day if needed for anxiety, Disp: , Rfl:    ARIPiprazole (ABILIFY) 30 MG  tablet, Take 30 mg by mouth at bedtime., Disp: , Rfl: 5   Ascorbic Acid (VITAMIN C GUMMIE PO), Take 1 tablet by mouth daily after breakfast. , Disp: , Rfl:    bisacodyl (DULCOLAX) 5 MG EC tablet, Take 5 mg by mouth every other day., Disp: , Rfl:    butalbital-acetaminophen-caffeine (FIORICET,  ESGIC) 50-325-40 MG per tablet, Take 2 tablets by mouth every 4 (four) hours as needed for headache or migraine. , Disp: , Rfl:    calcitonin, salmon, (MIACALCIN/FORTICAL) 200 UNIT/ACT nasal spray, Place 1 spray into alternate nostrils daily after breakfast. , Disp: , Rfl:    calcium carbonate (OSCAL) 1500 (600 Ca) MG TABS tablet, Take 600 mg of elemental calcium by mouth daily with breakfast., Disp: , Rfl:    Cholecalciferol (VITAMIN D) 2000 units CAPS, Take 2,000 Units by mouth daily after breakfast., Disp: , Rfl:    cyclobenzaprine (FLEXERIL) 10 MG tablet, Take 20 mg by mouth at bedtime. , Disp: , Rfl:    cycloSPORINE (RESTASIS) 0.05 % ophthalmic emulsion, Place 1 drop into both eyes 2 (two) times daily., Disp: , Rfl:    esomeprazole (NEXIUM) 40 MG capsule, Take 40 mg by mouth daily before breakfast.  , Disp: , Rfl:    ezetimibe (ZETIA) 10 MG tablet, Take 10 mg by mouth at bedtime., Disp: , Rfl:    fluticasone (FLONASE) 50 MCG/ACT nasal spray, Place 2 sprays into both nostrils 2 (two) times daily., Disp: 16 g, Rfl: 11   HYDROmorphone (DILAUDID) 2 MG tablet, Take 1 tablet by mouth every 8 (eight) hours as needed., Disp: , Rfl:    hydrOXYzine (ATARAX/VISTARIL) 25 MG tablet, Take 25-50 mg by mouth 2 (two) times daily. 25 mg every morning, 50 mg every night, Disp: , Rfl:    lamoTRIgine (LAMICTAL) 150 MG tablet, Take 300 mg by mouth at bedtime., Disp: , Rfl:    lisdexamfetamine (VYVANSE) 70 MG capsule, Take 70 mg by mouth daily after breakfast. , Disp: , Rfl:    Loratadine 10 MG CAPS, Take 10 mg by mouth at bedtime. , Disp: , Rfl:    meclizine (ANTIVERT) 25 MG tablet, Take 25 mg by mouth 3 (three) times daily as  needed for dizziness. , Disp: , Rfl:    Melatonin 300 MCG TABS, Take 300 mcg by mouth at bedtime., Disp: , Rfl:    omeprazole (PRILOSEC) 20 MG capsule, Take 20 mg by mouth at bedtime.  , Disp: , Rfl:    Polyethyl Glycol-Propyl Glycol (SYSTANE OP), Place 1 drop into both eyes at bedtime., Disp: , Rfl:    Potassium Gluconate 595 MG CAPS, Take 1 capsule by mouth 2 (two) times daily., Disp: , Rfl:    pramipexole (MIRAPEX) 0.5 MG tablet, Take 0.5 mg by mouth at bedtime.  , Disp: , Rfl:    pregabalin (LYRICA) 300 MG capsule, Take 300 mg by mouth daily with breakfast., Disp: , Rfl:    pregabalin (LYRICA) 300 MG capsule, Take 300 mg by mouth at bedtime., Disp: , Rfl:    Semaglutide-Weight Management (WEGOVY) 2.4 MG/0.75ML SOAJ, Inject 2.4 mg into the skin once a week., Disp: 9 mL, Rfl: 0   torsemide (DEMADEX) 5 MG tablet, Take 5 mg by mouth daily., Disp: , Rfl:    triamcinolone (KENALOG) 0.025 % cream, Apply 1 application topically 2 (two) times daily as needed (itching)., Disp: , Rfl:    valACYclovir (VALTREX) 1000 MG tablet, Take 1 tablet (1,000 mg total) by mouth 3 (three) times daily., Disp: 21 tablet, Rfl: 0   vitamin B-12 (CYANOCOBALAMIN) 1000 MCG tablet, Take 1,000 mcg by mouth daily after breakfast., Disp: , Rfl:    Semaglutide-Weight Management (WEGOVY) 1 MG/0.5ML SOAJ, Inject 1 mg under the skin once a week 30 days, Disp: 2 mL, Rfl: 0      Objective:   Vitals:  04/25/23 1300 04/25/23 1302  BP:  129/73  Pulse: 63 63  Temp:  97.8 F (36.6 C)  TempSrc:  Oral  SpO2: 97%   Weight:  183 lb (83 kg)  Height:  5\' 1"  (1.549 m)    Estimated body mass index is 34.58 kg/m as calculated from the following:   Height as of this encounter: 5\' 1"  (1.549 m).   Weight as of this encounter: 183 lb (83 kg).  @WEIGHTCHANGE @  [employer]   04/25/23 1302  Weight: 183 lb (83 kg)     Physical Exam   General: No distress. SITTING IN WHEEL CHAIR O2 at rest Cane present:  Sitting in wheel  chair: YES Frail: NO Obese: YES but has lost weight Neuro: Alert and Oriented x 3. GCS 15. Speech normal Psych: Pleasant Resp:  Barrel Chest - no.  Wheeze - no, Crackles - no, No overt respiratory distress CVS: Normal heart sounds. Murmurs - no Ext: Stigmata of Connective Tissue Disease - no HEENT: Normal upper airway. PEERL +. No post nasal drip        Assessment:       ICD-10-CM   1. Preop respiratory exam  Z01.811 CT Chest High Resolution    Pulse oximetry, overnight    Pulmonary function test    ECHOCARDIOGRAM COMPLETE    2. Stopped smoking with greater than 40 pack year history  Z87.891 CT Chest High Resolution    Pulse oximetry, overnight    Pulmonary function test    ECHOCARDIOGRAM COMPLETE    3. DOE (dyspnea on exertion)  R06.09 CT Chest High Resolution    Pulse oximetry, overnight    Pulmonary function test    ECHOCARDIOGRAM COMPLETE    4. Immobility  Z74.09 CT Chest High Resolution    Pulse oximetry, overnight    Pulmonary function test    ECHOCARDIOGRAM COMPLETE         Plan:     Patient Instructions     ICD-10-CM   1. Preop respiratory exam  Z01.811 CT Chest High Resolution    Pulse oximetry, overnight    Pulmonary function test    ECHOCARDIOGRAM COMPLETE    2. Stopped smoking with greater than 40 pack year history  Z87.891 CT Chest High Resolution    Pulse oximetry, overnight    Pulmonary function test    ECHOCARDIOGRAM COMPLETE    3. DOE (dyspnea on exertion)  R06.09 CT Chest High Resolution    Pulse oximetry, overnight    Pulmonary function test    ECHOCARDIOGRAM COMPLETE    4. Immobility  Z74.09 CT Chest High Resolution    Pulse oximetry, overnight    Pulmonary function test    ECHOCARDIOGRAM COMPLETE     Appears most of the risk for surgery seems to be coming from immobility and physical deconditioning and less from your lungs but will make further refined assessment after getting data  Plan  - get PFT spirometry only  - get  HRCT  - get ONO  - get ECHO  FOllowup /Nurse practitioner in 8 weeks.   FOLLOWUP Return in about 8 weeks (around 06/20/2023) for with any of the APPS, Face to Face Visit.    SIGNATURE    Dr. Kalman Shan, M.D., F.C.C.P,  Pulmonary and Critical Care Medicine Staff Physician, [facility] Director - Interstitial Lung Disease  Program  Pulmonary Fibrosis [facility] at [facility] [place], [place], [address]  Pager: [phone], If no answer  or between  15:00h - 7:00h: call [phone] Telephone: [phone]  1:33 PM 04/25/2023

## 2023-04-26 DIAGNOSIS — G894 Chronic pain syndrome: Secondary | ICD-10-CM | POA: Diagnosis not present

## 2023-04-26 DIAGNOSIS — M25572 Pain in left ankle and joints of left foot: Secondary | ICD-10-CM | POA: Diagnosis not present

## 2023-04-26 DIAGNOSIS — Z79891 Long term (current) use of opiate analgesic: Secondary | ICD-10-CM | POA: Diagnosis not present

## 2023-04-26 DIAGNOSIS — M25571 Pain in right ankle and joints of right foot: Secondary | ICD-10-CM | POA: Diagnosis not present

## 2023-04-26 DIAGNOSIS — M25511 Pain in right shoulder: Secondary | ICD-10-CM | POA: Diagnosis not present

## 2023-04-27 DIAGNOSIS — Z09 Encounter for follow-up examination after completed treatment for conditions other than malignant neoplasm: Secondary | ICD-10-CM | POA: Diagnosis not present

## 2023-04-27 DIAGNOSIS — H0279 Other degenerative disorders of eyelid and periocular area: Secondary | ICD-10-CM | POA: Diagnosis not present

## 2023-04-29 NOTE — Telephone Encounter (Signed)
 Clearance was not given at [facility] 04/25/23-  She is now scheduled with Tammy for ov with PFT for 06/23/23

## 2023-05-04 ENCOUNTER — Ambulatory Visit
Admission: RE | Admit: 2023-05-04 | Discharge: 2023-05-04 | Disposition: A | Discharge status: Home / Self Care | Source: Ambulatory Visit | Attending: Internal Medicine | Admitting: Internal Medicine

## 2023-05-04 DIAGNOSIS — Z7409 Other reduced mobility: Secondary | ICD-10-CM

## 2023-05-04 DIAGNOSIS — Z87891 Personal history of nicotine dependence: Secondary | ICD-10-CM

## 2023-05-04 DIAGNOSIS — R918 Other nonspecific abnormal finding of lung field: Secondary | ICD-10-CM | POA: Diagnosis not present

## 2023-05-04 DIAGNOSIS — Z01811 Encounter for preprocedural respiratory examination: Secondary | ICD-10-CM

## 2023-05-04 DIAGNOSIS — R0609 Other forms of dyspnea: Secondary | ICD-10-CM

## 2023-05-04 DIAGNOSIS — R0602 Shortness of breath: Secondary | ICD-10-CM | POA: Diagnosis not present

## 2023-05-11 DIAGNOSIS — G43909 Migraine, unspecified, not intractable, without status migrainosus: Secondary | ICD-10-CM | POA: Diagnosis not present

## 2023-05-11 DIAGNOSIS — R29898 Other symptoms and signs involving the musculoskeletal system: Secondary | ICD-10-CM | POA: Diagnosis not present

## 2023-05-16 ENCOUNTER — Ambulatory Visit (HOSPITAL_BASED_OUTPATIENT_CLINIC_OR_DEPARTMENT_OTHER)

## 2023-05-16 DIAGNOSIS — Z87891 Personal history of nicotine dependence: Secondary | ICD-10-CM

## 2023-05-16 DIAGNOSIS — Z01811 Encounter for preprocedural respiratory examination: Secondary | ICD-10-CM | POA: Diagnosis not present

## 2023-05-16 DIAGNOSIS — R0609 Other forms of dyspnea: Secondary | ICD-10-CM

## 2023-05-16 DIAGNOSIS — Z7409 Other reduced mobility: Secondary | ICD-10-CM

## 2023-05-16 LAB — ECHOCARDIOGRAM COMPLETE
Area-P 1/2: 2.87 cm2
S' Lateral: 3.19 cm

## 2023-05-18 DIAGNOSIS — R29898 Other symptoms and signs involving the musculoskeletal system: Secondary | ICD-10-CM | POA: Diagnosis not present

## 2023-05-18 DIAGNOSIS — K5900 Constipation, unspecified: Secondary | ICD-10-CM | POA: Diagnosis not present

## 2023-05-18 DIAGNOSIS — I10 Essential (primary) hypertension: Secondary | ICD-10-CM | POA: Diagnosis not present

## 2023-05-18 DIAGNOSIS — F3130 Bipolar disorder, current episode depressed, mild or moderate severity, unspecified: Secondary | ICD-10-CM | POA: Diagnosis not present

## 2023-05-18 DIAGNOSIS — N1831 Chronic kidney disease, stage 3a: Secondary | ICD-10-CM | POA: Diagnosis not present

## 2023-05-18 DIAGNOSIS — I890 Lymphedema, not elsewhere classified: Secondary | ICD-10-CM | POA: Diagnosis not present

## 2023-05-18 DIAGNOSIS — Z6833 Body mass index (BMI) 33.0-33.9, adult: Secondary | ICD-10-CM | POA: Diagnosis not present

## 2023-05-23 ENCOUNTER — Other Ambulatory Visit: Payer: Self-pay | Admitting: Emergency Medicine

## 2023-05-29 ENCOUNTER — Telehealth: Payer: Self-pay | Admitting: Internal Medicine

## 2023-05-29 DIAGNOSIS — J849 Interstitial pulmonary disease, unspecified: Secondary | ICD-10-CM

## 2023-05-29 NOTE — Telephone Encounter (Signed)
 Gurney Lefort  She came from pre op eval but ct shows mild ild  Plan   check seriology and quant gold - I have ordered She can come someitme next 1 week - she sees TP mid may     SIGNATURE    Dr. Maire Scot, M.D., F.C.C.P,  Pulmonary and Critical Care Medicine Staff Physician, [facility] Center Director - Interstitial Lung Disease  Program  Pulmonary Fibrosis [facility] at [facility] [place], [place], [address]   Pager: [phone], If no answer  -> Check AMION or Try [phone] Telephone (clinical office): [phone] Telephone (research): [phone]  9:37 PM 05/29/2023

## 2023-05-30 NOTE — Telephone Encounter (Signed)
 Tried calling the pt and there was no answer, no option to leave msg. Will call back.

## 2023-06-14 NOTE — Telephone Encounter (Signed)
 Tried calling the pt again, still no answer and no option for vm  She has upcoming appt 06/23/23  Will address at this visit

## 2023-06-15 DIAGNOSIS — F3130 Bipolar disorder, current episode depressed, mild or moderate severity, unspecified: Secondary | ICD-10-CM | POA: Diagnosis not present

## 2023-06-15 DIAGNOSIS — N1831 Chronic kidney disease, stage 3a: Secondary | ICD-10-CM | POA: Diagnosis not present

## 2023-06-15 DIAGNOSIS — I890 Lymphedema, not elsewhere classified: Secondary | ICD-10-CM | POA: Diagnosis not present

## 2023-06-15 DIAGNOSIS — Z6833 Body mass index (BMI) 33.0-33.9, adult: Secondary | ICD-10-CM | POA: Diagnosis not present

## 2023-06-15 DIAGNOSIS — I10 Essential (primary) hypertension: Secondary | ICD-10-CM | POA: Diagnosis not present

## 2023-06-15 DIAGNOSIS — K5900 Constipation, unspecified: Secondary | ICD-10-CM | POA: Diagnosis not present

## 2023-06-23 ENCOUNTER — Ambulatory Visit: Admitting: Internal Medicine

## 2023-06-23 ENCOUNTER — Encounter: Payer: Self-pay | Admitting: Adult Health

## 2023-06-23 ENCOUNTER — Ambulatory Visit: Admitting: Adult Health

## 2023-06-23 VITALS — BP 113/68 | HR 59 | Temp 97.6°F | Ht 61.0 in | Wt 184.4 lb

## 2023-06-23 DIAGNOSIS — J383 Other diseases of vocal cords: Secondary | ICD-10-CM

## 2023-06-23 DIAGNOSIS — Z01811 Encounter for preprocedural respiratory examination: Secondary | ICD-10-CM

## 2023-06-23 DIAGNOSIS — J849 Interstitial pulmonary disease, unspecified: Secondary | ICD-10-CM | POA: Diagnosis not present

## 2023-06-23 DIAGNOSIS — Z7409 Other reduced mobility: Secondary | ICD-10-CM

## 2023-06-23 DIAGNOSIS — R0609 Other forms of dyspnea: Secondary | ICD-10-CM

## 2023-06-23 DIAGNOSIS — Z87891 Personal history of nicotine dependence: Secondary | ICD-10-CM

## 2023-06-23 LAB — PULMONARY FUNCTION TEST
FEF 25-75 Pre: 1.38 L/s
FEF2575-%Pred-Pre: 90 %
FEV1-%Pred-Pre: 93 %
FEV1-Pre: 1.8 L
FEV1FVC-%Pred-Pre: 101 %
FEV6-%Pred-Pre: 95 %
FEV6-Pre: 2.35 L
FEV6FVC-%Pred-Pre: 104 %
FVC-%Pred-Pre: 91 %
FVC-Pre: 2.37 L
Pre FEV1/FVC ratio: 76 %
Pre FEV6/FVC Ratio: 99 %

## 2023-06-23 NOTE — Patient Instructions (Signed)
 Labs today  Refer to cardiology  Activity as tolerated  Albuterol  inhaler As needed   Follow up with Dr. Bertrum Brodie in 6 months with PFT .

## 2023-06-23 NOTE — Progress Notes (Unsigned)
 @Patient  ID: [name], female    DOB: [birth year], 78 y.o.   MRN: [id number]  Chief Complaint  Patient presents with   Follow-up    Referring provider: Jinger Mount, DO  HPI: 78 year old female former smoker seen April 25, 2023 with history of chronic cough and shortness of breath.  TEST/EVENTS :   06/23/2023 Follow up : Dyspnea/ILD, Preop Risk assessment  Discussed the use of AI scribe software for clinical note transcription with the patient, who gave verbal consent to proceed.  History of Present Illness   Misty Dick "Gaye Kato" is a 78 year old female with dyspnea, chronic cough.  She is a former smoker.  Patient was seen April 25, 2023 for preop pulmonary evaluation as she has upcoming knee surgery planned.  Patient is a former smoker.  She was set up for high-resolution CT chest completed on May 04, 2023 that showed mild subpleural reticular densities with mild progression since 2017 categorized as probable UIP.  She was recommended on autoimmune/connective tissue serology which is pending. She has a history of chronic cough and suspected mild COPD, initially evaluated in 2019 with a breathing test showing mild airflow obstruction. She no longer experiences the chronic cough but continues to have shortness of breath, especially when talking, requiring her to take breaths mid-sentence. Despite significant weight loss of approximately 93 pounds through a wellness program, which has slightly improved her shortness of breath, she still experiences episodes of hortness of breath, sometimes while at rest, such as when watching TV, which can lead to panic. Patient had spirometry done today that showed no significant airflow obstruction or restriction with FEV1 at 93% ratio 76 and FVC 91%.   She has a history of chronic bronchitis, particularly during her years of smoking, which she quit a long time ago. She has not been diagnosed with any autoimmune diseases and has not used drugs  that could damage the lungs, such as amiodarone or methotrexate.  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 She has a history of smoking and is a recovering alcoholic, with minimal recreational drug use in the past. She lives with a large number of pets, including 5 dogs and 25 cats,   She experiences chronic pain, primarily in her back and knees, and is mostly wheelchair-bound at home. She uses a recliner during the day and has limited mobility, which has led to severe deconditioning. She is on pain medications and has a spinal cord stimulator. Both knees require replacement surgery, which she hopes to have soon.  Her family history includes heart disease, with her father having had a heart attack at 61. She has had a stress test in the past, but details are unclear. She has a history of a blood clot in her groin and was on blood thinners during that time.  She uses an albuterol  inhaler, not currently using.  She does not experience wheezing but reports episodes of vocal cord dysfunction, which causes a sensation of her throat closing up, especially during prolonged talking.          Allergies  Allergen Reactions   Lisinopril Other (See Comments)    kidney failure   Lithium Other (See Comments)    Migraines and "drunk"   Statins Other (See Comments)    Very weak and achy   Adhesive [Tape] Rash    All tape, paper and steri-strips included   Codeine Nausea And Vomiting   Scopolamine  Other (See Comments)  Causes vertigo    Immunization History  Administered Date(s) Administered   Influenza Split 02/09/2012, 10/03/2017, 11/08/2019   Influenza Whole 02/08/2009, 11/08/2009   Influenza, High Dose Seasonal PF 12/14/2013, 11/27/2014, 11/04/2015, 11/09/2016, 11/09/2022   Influenza-Unspecified 10/20/2012, 02/22/2013, 10/12/2016   PFIZER(Purple Top)SARS-COV-2 Vaccination 03/23/2019, 04/17/2019, 10/19/2019, 05/12/2020   PNEUMOCOCCAL  CONJUGATE-20 08/26/2021   Pfizer Covid-19 Vaccine Bivalent Booster 71yrs & up 10/28/2020   Pneumococcal Conjugate-13 04/27/2013, 11/21/2015   Pneumococcal Polysaccharide-23 11/08/2008, 02/08/2009, 12/08/2016   Pneumococcal-Unspecified 10/09/2009   Td 10/13/2001, 07/09/2011   Tdap 02/17/2021   Zoster, Live 12/16/2010    Past Medical History:  Diagnosis Date   Anxiety    Asthma    Bipolar affective disorder (HCC)    COPD (chronic obstructive pulmonary disease) (HCC)    Cough    Depression    DJD (degenerative joint disease) of lumbar spine    DVT (deep venous thrombosis) (HCC) 2010   groin left - on coumadin for 6 months   Family history of anesthesia complication    Hx: father has nausea   Fibromyalgia    GERD (gastroesophageal reflux disease)    Left leg DVT (HCC) 2010   Lymphedema 2019   Migraine    Pneumonia    PONV (postoperative nausea and vomiting)    and migraines   Shortness of breath    exertion   Spinal cord stimulator dysfunction (HCC)    has one in,but not working   Spinal headache    Vertigo    Hx: of   Vocal cord dysfunction    sees dr. Baldwin Levee    Tobacco History: Social History   Tobacco Use  Smoking Status Former   Current packs/day: 0.00   Average packs/day: 2.5 packs/day for 21.0 years (52.5 ttl pk-yrs)   Types: Cigarettes   Start date: 09/15/1965   Quit date: 09/16/1986   Years since quitting: 36.7  Smokeless Tobacco Never   Counseling given: Not Answered   Outpatient Medications Prior to Visit  Medication Sig Dispense Refill   albuterol  (VENTOLIN  HFA) 108 (90 Base) MCG/ACT inhaler INHALE 2 PUFFS EVERY 6 HOURS AS NEEDED FOR WHEEZING OR SHORTNESS OF BREATH. 18 g 6   ALPRAZolam  (XANAX ) 1 MG tablet Take 2 mg by mouth at bedtime. May take a dose of 1 mg during the day if needed for anxiety     ARIPiprazole  (ABILIFY ) 30 MG tablet Take 30 mg by mouth at bedtime.  5   Ascorbic Acid  (VITAMIN C GUMMIE PO) Take 1 tablet by mouth daily after breakfast.       bisacodyl  (DULCOLAX) 5 MG EC tablet Take 5 mg by mouth every other day.     butalbital -acetaminophen -caffeine  (FIORICET , ESGIC ) 50-325-40 MG per tablet Take 2 tablets by mouth every 4 (four) hours as needed for headache or migraine.      calcitonin, salmon, (MIACALCIN/FORTICAL) 200 UNIT/ACT nasal spray Place 1 spray into alternate nostrils daily after breakfast.      calcium  carbonate (OSCAL) 1500 (600 Ca) MG TABS tablet Take 600 mg of elemental calcium  by mouth daily with breakfast.     Cholecalciferol  (VITAMIN D ) 2000 units CAPS Take 2,000 Units by mouth daily after breakfast.     cyclobenzaprine  (FLEXERIL ) 10 MG tablet Take 20 mg by mouth at bedtime.      cycloSPORINE  (RESTASIS ) 0.05 % ophthalmic emulsion Place 1 drop into both eyes 2 (two) times daily.     esomeprazole (NEXIUM) 40 MG capsule Take 40 mg by mouth daily before breakfast.  ezetimibe  (ZETIA ) 10 MG tablet Take 10 mg by mouth at bedtime.     fluticasone  (FLONASE ) 50 MCG/ACT nasal spray Place 2 sprays into both nostrils 2 (two) times daily. 16 g 11   HYDROmorphone  (DILAUDID ) 2 MG tablet Take 1 tablet by mouth every 8 (eight) hours as needed.     hydrOXYzine  (ATARAX /VISTARIL ) 25 MG tablet Take 25-50 mg by mouth 2 (two) times daily. 25 mg every morning, 50 mg every night     lamoTRIgine  (LAMICTAL ) 150 MG tablet Take 300 mg by mouth at bedtime.     lisdexamfetamine (VYVANSE ) 70 MG capsule Take 70 mg by mouth daily after breakfast.      Loratadine  10 MG CAPS Take 10 mg by mouth at bedtime.      meclizine  (ANTIVERT ) 25 MG tablet Take 25 mg by mouth 3 (three) times daily as needed for dizziness.      Melatonin 300 MCG TABS Take 300 mcg by mouth at bedtime.     omeprazole (PRILOSEC) 20 MG capsule Take 20 mg by mouth at bedtime.       Polyethyl Glycol-Propyl Glycol (SYSTANE OP) Place 1 drop into both eyes at bedtime.     Potassium Gluconate 595 MG CAPS Take 1 capsule by mouth 2 (two) times daily.     pramipexole  (MIRAPEX ) 0.5 MG  tablet Take 0.5 mg by mouth at bedtime.       pregabalin  (LYRICA ) 300 MG capsule Take 300 mg by mouth at bedtime.     Semaglutide -Weight Management (WEGOVY ) 2.4 MG/0.75ML SOAJ Inject 2.4 mg into the skin once a week. 9 mL 0   torsemide (DEMADEX) 5 MG tablet Take 5 mg by mouth daily.     triamcinolone  (KENALOG) 0.025 % cream Apply 1 application topically 2 (two) times daily as needed (itching).     valACYclovir  (VALTREX ) 1000 MG tablet Take 1 tablet (1,000 mg total) by mouth 3 (three) times daily. 21 tablet 0   vitamin B-12 (CYANOCOBALAMIN ) 1000 MCG tablet Take 1,000 mcg by mouth daily after breakfast.     pregabalin  (LYRICA ) 300 MG capsule Take 300 mg by mouth daily with breakfast.     Semaglutide -Weight Management (WEGOVY ) 1 MG/0.5ML SOAJ Inject 1 mg under the skin once a week 30 days 2 mL 0   No facility-administered medications prior to visit.     Review of Systems:  + Constitutional:   No  weight loss, night sweats,  Fevers, chills, fatigue, or  lassitude.  HEENT:   No headaches,  Difficulty swallowing,  Tooth/dental problems, or  Sore throat,                No sneezing, itching, ear ache, nasal congestion, post nasal drip,   CV:  No chest pain,  Orthopnea, PND, swelling in lower extremities, anasarca, dizziness, palpitations, syncope.   GI  No heartburn, indigestion, abdominal pain, nausea, vomiting, diarrhea, change in bowel habits, loss of appetite, bloody stools.   Resp:   No chest wall deformity  Skin: no rash or lesions.  GU: no dysuria, change in color of urine, no urgency or frequency.  No flank pain, no hematuria   MS:  No joint pain or swelling.  No decreased range of motion.  No back pain.    Physical Exam  BP 113/68 (BP Location: Left Arm, Patient Position: Sitting, Cuff Size: Large)   Pulse (!) 59   Temp 97.6 F (36.4 C) (Temporal)   Ht 5\' 1"  (1.549 m)   Wt 184 lb  6.4 oz (83.6 kg)   SpO2 95%   BMI 34.84 kg/m   GEN: A/Ox3; pleasant , NAD, well nourished  wheelchair   HEENT:  [place]/AT,  NOSE-clear, THROAT-clear, no lesions, no postnasal drip or exudate noted.   NECK:  Supple w/ fair ROM; no JVD; normal carotid impulses w/o bruits; no thyromegaly or nodules palpated; no lymphadenopathy.    RESP  Clear  P & A; w/o, wheezes/ rales/ or rhonchi. no accessory muscle use, no dullness to percussion  CARD:  RRR, no m/r/g, no peripheral edema, pulses intact, no cyanosis or clubbing.  GI:   Soft & nt; nml bowel sounds; no organomegaly or masses detected.   Musco: Warm bil, no deformities or joint swelling noted.   Neuro: alert, no focal deficits noted.    Skin: Warm, no lesions or rashes    Lab Results:    BNP No results found for: "BNP"  ProBNP No results found for: "PROBNP"  Imaging: No results found.  Administration History     None          Latest Ref Rng & Units 06/23/2023    8:23 AM 04/13/2017    1:42 PM  PFT Results  FVC-Pre L 2.37  P 2.48   FVC-Predicted Pre % 91  P 88   FVC-Post L  2.32   FVC-Predicted Post %  82   Pre FEV1/FVC % % 76  P 76   Post FEV1/FCV % %  79   FEV1-Pre L 1.80  P 1.89   FEV1-Predicted Pre % 93  P 89   FEV1-Post L  1.83   DLCO uncorrected ml/min/mmHg  15.56   DLCO UNC% %  69   DLCO corrected ml/min/mmHg  17.40   DLCO COR %Predicted %  78   DLVA Predicted %  88   TLC L  4.70   TLC % Predicted %  97   RV % Predicted %  92     P Preliminary result    No results found for: "NITRICOXIDE"      Assessment & Plan:   Assessment and Plan    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  Patient has minimum symptoms with no significant cough.  Does have chronic shortness of breath which suspect is multifactorial with significant physical deconditioning. Mild pulmonary fibrosis with scarring primarily in the lung bases has shown slight progression over 7-8 years, contributing to dyspnea and intermittent cough.  Spirometry today shows no significant restriction or obstruction.  Will set  up for a full PFT on return visit..  She has a history of smoking but no current smoking or significant occupational exposures. .  Autoimmune/connective tissue serology is pending  Strong family history of coronary artery disease.   Refer to cardiology for evaluation of potential cardiac contribution to dyspnea, given family history of coronary artery disease.  Vocal cord dysfunction   Vocal cord dysfunction causes dyspnea during prolonged talking or reading, with a sensation of throat closure.  Management includes lifestyle modifications to reduce symptoms. Advise on voice exercises and lifestyle modifications, including avoiding prolonged talking, taking sips of water, and practicing deep breathing exercises.  Chronic pain   Chronic pain in her back and knees contributes to immobility and deconditioning. She is on pain medications and has a spinal cord stimulator. Pain is exacerbated by standing, limiting physical activity and contributing to deconditioning and dyspnea.   Gastroesophageal reflux disease (GERD)   GERD potentially contributes to vocal cord dysfunction and dyspnea.  Preop risk assessment-from a pulmonary standpoint patient is currently not on oxygen.  Spirometry shows no airflow obstruction or restriction.  She does have mild ILD changes.  Is on minimum pulmonary maintenance regimen.  She would be a mild to moderate risk from a pulmonary standpoint  Major Pulmonary risks identified in the multifactorial risk analysis are but not limited to a) pneumonia; b) recurrent intubation risk; c) prolonged or recurrent acute respiratory failure needing mechanical ventilation; d) prolonged hospitalization; e) DVT/Pulmonary embolism; f) Acute Pulmonary edema  Recommend 1. Short duration of surgery as much as possible and avoid paralytic if possible 2. Recovery in step down or ICU with Pulmonary consultation if indicated. 3. DVT prophylaxis per protocol 4. Aggressive pulmonary toilet  with o2, bronchodilatation, and incentive spirometry and early ambulation     Shaquetta Arcos, NP 06/23/2023

## 2023-06-23 NOTE — Patient Instructions (Signed)
Spirometry only performed today.

## 2023-06-23 NOTE — Progress Notes (Signed)
Spirometry only performed today.

## 2023-06-24 NOTE — Telephone Encounter (Signed)
 Tammy, will you please add risk assessment to your note from 06/23/23? Thanks!

## 2023-06-24 NOTE — Telephone Encounter (Signed)
 Done

## 2023-06-25 LAB — ANTI-NUCLEAR AB-TITER (ANA TITER): ANA Titer 1: 1:80 {titer} — ABNORMAL HIGH

## 2023-06-25 LAB — CYCLIC CITRUL PEPTIDE ANTIBODY, IGG: Cyclic Citrullin Peptide Ab: <16 U

## 2023-06-25 LAB — QUANTIFERON-TB GOLD PLUS
Mitogen-NIL: 7.47 [IU]/mL
NIL: 0.01 [IU]/mL
QuantiFERON-TB Gold Plus: NEGATIVE
TB1-NIL: 0 [IU]/mL
TB2-NIL: 0 [IU]/mL

## 2023-06-25 LAB — ANA: Anti Nuclear Antibody (ANA): POSITIVE — AB

## 2023-06-25 LAB — SJOGRENS SYNDROME-B EXTRACTABLE NUCLEAR ANTIBODY: SSB (La) (ENA) Antibody, IgG: <1 AI

## 2023-06-25 LAB — SJOGRENS SYNDROME-A EXTRACTABLE NUCLEAR ANTIBODY: SSA (Ro) (ENA) Antibody, IgG: <1 AI

## 2023-06-25 LAB — RHEUMATOID FACTOR: Rheumatoid fact SerPl-aCnc: <10 [IU]/mL (ref <14)

## 2023-06-25 LAB — ANTI-SCLERODERMA ANTIBODY: Scleroderma (Scl-70) (ENA) Antibody, IgG: <1 AI

## 2023-06-28 ENCOUNTER — Telehealth: Payer: Self-pay | Admitting: *Deleted

## 2023-06-28 NOTE — Telephone Encounter (Signed)
 Copied from CRM [id number]. Topic: Clinical - Lab/Test Results >> Jun 28, 2023  2:27 PM [name] wrote: Reason for CRM: Patient would like to discuss her recent blood work results with [name] nurse.   Spoke with the pt  She is concerned after reviewing her labs from 06/23/23 in [place], can you look at them and advise?  MR out of the office and she thinks one of the labs indicated she has "cancer in my ear" and is very anxious

## 2023-06-30 NOTE — Telephone Encounter (Signed)
 Tried to call no answer. LMOMTCB  Labs are negative except for ANA is mildly elevated with Neg CCP. Suspect this is non specific .  Can discuss in full detail with Dr. Bertrum Brodie on return office visit and decide if additional testing or referral is indicated.   Nothing in labs completed would be concerning for ear cancer. Please let patient know.

## 2023-07-01 NOTE — Telephone Encounter (Signed)
 Called the pt and there was no answer- LMTCB

## 2023-07-07 ENCOUNTER — Encounter: Payer: Self-pay | Admitting: Adult Health

## 2023-07-07 ENCOUNTER — Ambulatory Visit: Admitting: Adult Health

## 2023-07-07 VITALS — BP 117/66 | HR 59 | Temp 98.5°F | Ht 61.0 in | Wt 184.0 lb

## 2023-07-07 DIAGNOSIS — Z87891 Personal history of nicotine dependence: Secondary | ICD-10-CM

## 2023-07-07 DIAGNOSIS — J849 Interstitial pulmonary disease, unspecified: Secondary | ICD-10-CM | POA: Insufficient documentation

## 2023-07-07 DIAGNOSIS — J449 Chronic obstructive pulmonary disease, unspecified: Secondary | ICD-10-CM

## 2023-07-07 NOTE — Assessment & Plan Note (Signed)
 ILD with probable UIP pattern on high-resolution CT chest.  Patient has minimum symptom burden with no significant cough.  Does have some shortness of breath but very sedentary and deconditioned.  Will check full PFTs on return visit.  Autoimmune/connective tissue serology was negative except for positive ANA, 1: 80 pattern.  CCP was negative.  Patient has no significant symptoms such as joint swelling, redness or rash.  Has no family history or personal history of autoimmune/connective tissues.  Will repeat ANA on return visit and consider if referral to rheumatology is indicated Check full PFTs with [facility]CO on return visit. Plan  Patient Instructions  Activity as tolerated  Albuterol  inhaler As needed   Follow up with Dr. Bertrum Brodie in 6 months with PFT .

## 2023-07-07 NOTE — Patient Instructions (Signed)
 Activity as tolerated  Albuterol  inhaler As needed   Follow up with Dr. Bertrum Brodie in 6 months with PFT .

## 2023-07-07 NOTE — Progress Notes (Signed)
 @Patient  ID: [name], female    DOB: [birth year], 78 y.o.   MRN: [id number]  Chief Complaint  Patient presents with   Follow-up    Referring provider: No ref. 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  Patient was seen last visit.  She is a former smoker.  Has mild airflow obstruction on previous PFTs felt to have some mild COPD and chronic bronchitis.  Last visit she was set up for spirometry that showed no significant airflow obstruction or restriction with an FEV1 at 93%, ratio 76 and FVC at 91%.  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  She was recommended to have repeat full PFTs in 6 months.  Autoimmune labs were recommended and were essentially negative except for mildly positive ANA 1: 80, CCP negative.  She is here today to discuss her abnormal lab work.  She has no known history of autoimmune or family history of autoimmune disease.  She has no significant cough.  Does get short of breath with activities but is very sedentary. No joint swelling or rash    Allergies  Allergen Reactions   Lisinopril Other (See Comments)    kidney failure   Lithium Other (See Comments)    Migraines and "drunk"   Statins Other (See Comments)    Very weak and achy   Adhesive [Tape] Rash    All tape, paper and steri-strips included   Codeine Nausea And Vomiting   Scopolamine  Other (See Comments)    Causes vertigo    Immunization History  Administered Date(s) Administered   Influenza Split 02/09/2012, 10/03/2017, 11/08/2019   Influenza Whole 02/08/2009, 11/08/2009   Influenza, High Dose Seasonal PF 12/14/2013, 11/27/2014, 11/04/2015, 11/09/2016, 11/09/2022    Influenza-Unspecified 10/20/2012, 02/22/2013, 10/12/2016   PFIZER(Purple Top)SARS-COV-2 Vaccination 03/23/2019, 04/17/2019, 10/19/2019, 05/12/2020   PNEUMOCOCCAL CONJUGATE-20 08/26/2021   Pfizer Covid-19 Vaccine Bivalent Booster 79yrs & up 10/28/2020   Pneumococcal Conjugate-13 04/27/2013, 11/21/2015   Pneumococcal Polysaccharide-23 11/08/2008, 02/08/2009, 12/08/2016   Pneumococcal-Unspecified 10/09/2009   Td 10/13/2001, 07/09/2011   Tdap 02/17/2021   Zoster, Live 12/16/2010    Past Medical History:  Diagnosis Date   Anxiety    Asthma    Bipolar affective disorder (HCC)    COPD (chronic obstructive pulmonary disease) (HCC)    Cough    Depression    DJD (degenerative joint disease) of lumbar spine    DVT (deep venous thrombosis) (HCC) 2010   groin left - on coumadin for 6 months   Family history of anesthesia complication    Hx: father has nausea   Fibromyalgia    GERD (gastroesophageal reflux disease)    Left leg DVT (HCC) 2010   Lymphedema 2019   Migraine    Pneumonia    PONV (postoperative nausea and vomiting)    and migraines   Shortness of breath    exertion   Spinal cord stimulator dysfunction (HCC)    has one in,but not working   Spinal headache    Vertigo    Hx: of   Vocal cord dysfunction    sees dr. Baldwin Levee    Tobacco History: Social History   Tobacco Use  Smoking Status Former   Current packs/day: 0.00  Average packs/day: 2.5 packs/day for 21.0 years (52.5 ttl pk-yrs)   Types: Cigarettes   Start date: 09/15/1965   Quit date: 09/16/1986   Years since quitting: 36.8  Smokeless Tobacco Never   Counseling given: Not Answered   Outpatient Medications Prior to Visit  Medication Sig Dispense Refill   albuterol  (VENTOLIN  HFA) 108 (90 Base) MCG/ACT inhaler INHALE 2 PUFFS EVERY 6 HOURS AS NEEDED FOR WHEEZING OR SHORTNESS OF BREATH. 18 g 6   ALPRAZolam  (XANAX ) 1 MG tablet Take 2 mg by mouth at bedtime. May take a dose of 1 mg during the day if needed for  anxiety     ARIPiprazole  (ABILIFY ) 30 MG tablet Take 30 mg by mouth at bedtime.  5   Ascorbic Acid  (VITAMIN C GUMMIE PO) Take 1 tablet by mouth daily after breakfast.      bisacodyl  (DULCOLAX) 5 MG EC tablet Take 5 mg by mouth every other day.     butalbital -acetaminophen -caffeine  (FIORICET , ESGIC ) 50-325-40 MG per tablet Take 2 tablets by mouth every 4 (four) hours as needed for headache or migraine.      calcitonin, salmon, (MIACALCIN/FORTICAL) 200 UNIT/ACT nasal spray Place 1 spray into alternate nostrils daily after breakfast.      calcium  carbonate (OSCAL) 1500 (600 Ca) MG TABS tablet Take 600 mg of elemental calcium  by mouth daily with breakfast.     Cholecalciferol  (VITAMIN D ) 2000 units CAPS Take 2,000 Units by mouth daily after breakfast.     cyclobenzaprine  (FLEXERIL ) 10 MG tablet Take 20 mg by mouth at bedtime.      cycloSPORINE  (RESTASIS ) 0.05 % ophthalmic emulsion Place 1 drop into both eyes 2 (two) times daily.     esomeprazole (NEXIUM) 40 MG capsule Take 40 mg by mouth daily before breakfast.       ezetimibe  (ZETIA ) 10 MG tablet Take 10 mg by mouth at bedtime.     fluticasone  (FLONASE ) 50 MCG/ACT nasal spray Place 2 sprays into both nostrils 2 (two) times daily. 16 g 11   HYDROmorphone  (DILAUDID ) 2 MG tablet Take 1 tablet by mouth every 8 (eight) hours as needed.     hydrOXYzine  (ATARAX /VISTARIL ) 25 MG tablet Take 25-50 mg by mouth 2 (two) times daily. 25 mg every morning, 50 mg every night     lamoTRIgine  (LAMICTAL ) 150 MG tablet Take 300 mg by mouth at bedtime.     lisdexamfetamine (VYVANSE ) 70 MG capsule Take 70 mg by mouth daily after breakfast.      Loratadine  10 MG CAPS Take 10 mg by mouth at bedtime.      meclizine  (ANTIVERT ) 25 MG tablet Take 25 mg by mouth 3 (three) times daily as needed for dizziness.      Melatonin 300 MCG TABS Take 300 mcg by mouth at bedtime.     omeprazole (PRILOSEC) 20 MG capsule Take 20 mg by mouth at bedtime.       Polyethyl Glycol-Propyl Glycol  (SYSTANE OP) Place 1 drop into both eyes at bedtime.     Potassium Gluconate 595 MG CAPS Take 1 capsule by mouth 2 (two) times daily.     pramipexole  (MIRAPEX ) 0.5 MG tablet Take 0.5 mg by mouth at bedtime.       pregabalin  (LYRICA ) 300 MG capsule Take 300 mg by mouth daily with breakfast.     pregabalin  (LYRICA ) 300 MG capsule Take 300 mg by mouth at bedtime.     Semaglutide -Weight Management (WEGOVY ) 1 MG/0.5ML SOAJ Inject 1 mg under the skin once  a week 30 days 2 mL 0   Semaglutide -Weight Management (WEGOVY ) 2.4 MG/0.75ML SOAJ Inject 2.4 mg into the skin once a week. 9 mL 0   torsemide (DEMADEX) 5 MG tablet Take 5 mg by mouth daily.     triamcinolone  (KENALOG) 0.025 % cream Apply 1 application topically 2 (two) times daily as needed (itching).     valACYclovir  (VALTREX ) 1000 MG tablet Take 1 tablet (1,000 mg total) by mouth 3 (three) times daily. 21 tablet 0   vitamin B-12 (CYANOCOBALAMIN ) 1000 MCG tablet Take 1,000 mcg by mouth daily after breakfast.     No facility-administered medications prior to visit.     Review of Systems:   Constitutional:   No  weight loss, night sweats,  Fevers, chills,+ fatigue, or  lassitude.  HEENT:   No headaches,  Difficulty swallowing,  Tooth/dental problems, or  Sore throat,                No sneezing, itching, ear ache, nasal congestion, post nasal drip,   CV:  No chest pain,  Orthopnea, PND, swelling in lower extremities, anasarca, dizziness, palpitations, syncope.   GI  No heartburn, indigestion, abdominal pain, nausea, vomiting, diarrhea, change in bowel habits, loss of appetite, bloody stools.   Resp: No shortness of breath with exertion or at rest.  No excess mucus, no productive cough,  No non-productive cough,  No coughing up of blood.  No change in color of mucus.  No wheezing.  No chest wall deformity  Skin: no rash or lesions.  GU: no dysuria, change in color of urine, no urgency or frequency.  No flank pain, no hematuria   MS: +chronic  knee pain     Physical Exam  BP 117/66 (BP Location: Left Arm, Patient Position: Sitting, Cuff Size: Normal)   Pulse (!) 59   Temp 98.5 F (36.9 C) (Oral)   Ht 5\' 1"  (1.549 m)   Wt 184 lb (83.5 kg)   SpO2 96%   BMI 34.77 kg/m   GEN: A/Ox3; pleasant , NAD, well nourished in wheelchair thank you   HEENT:  [place]/AT,  NOSE-clear, THROAT-clear, no lesions, no postnasal drip or exudate noted.   NECK:  Supple w/ fair ROM; no JVD; normal carotid impulses w/o bruits; no thyromegaly or nodules palpated; no lymphadenopathy.    RESP  BB crackles . no accessory muscle use, no dullness to percussion, Kyphoscoliosis   CARD:  RRR, no m/r/g, no peripheral edema, pulses intact, no cyanosis or clubbing.  GI:   Soft & nt; nml bowel sounds; no organomegaly or masses detected.   Musco: Warm bil, no deformities or joint swelling noted.   Neuro: alert, no focal deficits noted.    Skin: Warm, no lesions or rashes    Lab Results:  CBC    BNP No results found for: "BNP"  ProBNP No results found for: "PROBNP"  Imaging: No results found.  Administration History     None          Latest Ref Rng & Units 06/23/2023    8:23 AM 04/13/2017    1:42 PM  PFT Results  FVC-Pre L 2.37  2.48   FVC-Predicted Pre % 91  88   FVC-Post L  2.32   FVC-Predicted Post %  82   Pre FEV1/FVC % % 76  76   Post FEV1/FCV % %  79   FEV1-Pre L 1.80  1.89   FEV1-Predicted Pre % 93  89   FEV1-Post  L  1.83   DLCO uncorrected ml/min/mmHg  15.56   DLCO UNC% %  69   DLCO corrected ml/min/mmHg  17.40   DLCO COR %Predicted %  78   DLVA Predicted %  88   TLC L  4.70   TLC % Predicted %  97   RV % Predicted %  92     No results found for: "NITRICOXIDE"      Assessment & Plan:   COPD (chronic obstructive pulmonary disease) (HCC) Chronic bronchitis/COPD with minimum symptoms.  Recent spirometry with no significant airflow obstruction or restriction.  Will check full PFTs on return visit.  Use albuterol   as needed  Plan  Patient Instructions  Activity as tolerated  Albuterol  inhaler As needed   Follow up with Dr. Bertrum Brodie in 6 months with PFT .     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  Patient has minimum symptom burden with no significant cough.  Does have some shortness of breath but very sedentary and deconditioned.  Will check full PFTs on return visit.  Autoimmune/connective tissue serology was negative except for positive ANA, 1: 80 pattern.  CCP was negative.  Patient has no significant symptoms such as joint swelling, redness or rash.  Has no family history or personal history of autoimmune/connective tissues.  Will repeat ANA on return visit and consider if referral to rheumatology is indicated Check full PFTs with DLCO on return visit. Plan  Patient Instructions  Activity as tolerated  Albuterol  inhaler As needed   Follow up with Dr. Bertrum Brodie in 6 months with PFT .        Roena Clark, NP 07/07/2023

## 2023-07-07 NOTE — Assessment & Plan Note (Signed)
 Chronic bronchitis/COPD with minimum symptoms.  Recent spirometry with no significant airflow obstruction or restriction.  Will check full PFTs on return visit.  Use albuterol  as needed  Plan  Patient Instructions  Activity as tolerated  Albuterol  inhaler As needed   Follow up with Dr. Bertrum Brodie in 6 months with PFT .

## 2023-07-12 NOTE — Telephone Encounter (Signed)
 Pt has been seen by Roena Clark for in person appt and results were discussed.

## 2023-07-13 DIAGNOSIS — Z6833 Body mass index (BMI) 33.0-33.9, adult: Secondary | ICD-10-CM | POA: Diagnosis not present

## 2023-07-13 DIAGNOSIS — I10 Essential (primary) hypertension: Secondary | ICD-10-CM | POA: Diagnosis not present

## 2023-07-13 DIAGNOSIS — G720 Drug-induced myopathy: Secondary | ICD-10-CM | POA: Diagnosis not present

## 2023-07-13 DIAGNOSIS — S51812A Laceration without foreign body of left forearm, initial encounter: Secondary | ICD-10-CM | POA: Diagnosis not present

## 2023-07-13 DIAGNOSIS — E785 Hyperlipidemia, unspecified: Secondary | ICD-10-CM | POA: Diagnosis not present

## 2023-07-14 ENCOUNTER — Telehealth: Payer: Self-pay | Admitting: *Deleted

## 2023-07-14 NOTE — Telephone Encounter (Signed)
 Copied from CRM [id number]. Topic: Clinical - Medication Question >> Jul 13, 2023  4:42 PM Dyann Glaser G wrote: Reason for CRM: PT CALLED IN REGRADS TO A FAX THAT [facility] SENT OVER FOR CLEARANCE FOR HER KNEE SURGERY STATED SHERRY FROM THE [facility] SENT OVER THE DOCUMENTS FOR THE PROVIDER TO COMPLETE.   Ashlyn, have you seen anything come through on this pt? It looks like Tammy's note from 07/07/23 has the risk assessment, we just need the info from the clearance request on where to fax the note back to, thanks!

## 2023-07-20 DIAGNOSIS — Z6833 Body mass index (BMI) 33.0-33.9, adult: Secondary | ICD-10-CM | POA: Diagnosis not present

## 2023-07-20 DIAGNOSIS — I10 Essential (primary) hypertension: Secondary | ICD-10-CM | POA: Diagnosis not present

## 2023-07-20 DIAGNOSIS — I890 Lymphedema, not elsewhere classified: Secondary | ICD-10-CM | POA: Diagnosis not present

## 2023-07-20 DIAGNOSIS — K5900 Constipation, unspecified: Secondary | ICD-10-CM | POA: Diagnosis not present

## 2023-07-20 DIAGNOSIS — N1831 Chronic kidney disease, stage 3a: Secondary | ICD-10-CM | POA: Diagnosis not present

## 2023-07-20 DIAGNOSIS — F3130 Bipolar disorder, current episode depressed, mild or moderate severity, unspecified: Secondary | ICD-10-CM | POA: Diagnosis not present

## 2023-07-22 ENCOUNTER — Telehealth: Payer: Self-pay

## 2023-07-22 NOTE — Telephone Encounter (Signed)
 Copied from CRM [id number]. Topic: Referral - Prior Authorization Question >> Jul 22, 2023 12:31 PM Corean Deutscher wrote: Reason for CRM: Patient called regarding her surgical clearance. Patient stated she is calling to inquire about her surgical clearance for her knee surgery. Patient stated she has called several times in regards to this and she wanted to followup. Patient stated the surgical clearance needs to be faxed over to [facility]. Patient does not have the fax number or information for the surgical clearance information and stated her surgery cannot be scheduled until the surgical clearence is filled out, Patient stated the surgical clearance has been faxed over several times per the surgery scheduler. Patient stated the phone number is [phone]-[facility] is the phone number to contact [facility]gery Scheduler.    Please refer to 07/14/2023 phone note.   I have contacted [facility] and obtained fax number. Last OV note has been faxed to [facility] at [phone]. Pt is aware and voiced her understanding. She requested that I send her a Clinical cytogeneticist chart message with [facility] fax number.  Nothing further needed.

## 2023-07-25 NOTE — Telephone Encounter (Signed)
I do not have anything for this pt

## 2023-07-26 DIAGNOSIS — Z79891 Long term (current) use of opiate analgesic: Secondary | ICD-10-CM | POA: Diagnosis not present

## 2023-07-26 DIAGNOSIS — F1120 Opioid dependence, uncomplicated: Secondary | ICD-10-CM | POA: Diagnosis not present

## 2023-07-26 DIAGNOSIS — M25572 Pain in left ankle and joints of left foot: Secondary | ICD-10-CM | POA: Diagnosis not present

## 2023-07-26 DIAGNOSIS — G894 Chronic pain syndrome: Secondary | ICD-10-CM | POA: Diagnosis not present

## 2023-07-26 DIAGNOSIS — M545 Low back pain, unspecified: Secondary | ICD-10-CM | POA: Diagnosis not present

## 2023-07-27 NOTE — Telephone Encounter (Signed)
 I still have not received any info on what surgery she is having  I called [facility] and spoke with Loretta Romp ov note dated 06/23/23 with risk assessment to her at [phone].

## 2023-07-28 ENCOUNTER — Telehealth (HOSPITAL_BASED_OUTPATIENT_CLINIC_OR_DEPARTMENT_OTHER): Payer: Self-pay | Admitting: *Deleted

## 2023-07-28 NOTE — Telephone Encounter (Signed)
 Patient is scheduled for a new patient on 10/25/23 patient is needing a preop clearance procedure is not yet scheduled but patient would like to see if she is able to be seen sooner than September made patient aware I would make our scheduling team aware to see if they could get her in sooner she voiced understanding.

## 2023-07-28 NOTE — Telephone Encounter (Signed)
   Pre-operative Risk Assessment    Patient Name: [name]  DOB: 11/20/1945 MRN: [id number]   Date of last office visit: None Date of next office visit: 10/25/2023 New Patient. Did not add pre op to appt notes.   Request for Surgical Clearance    Procedure:  Left Total Knee Arthroplasty  Date of Surgery:  Clearance TBD                                 Surgeon:  Dr. Osa Blase Surgeon's Group or Practice Name:  [facility] Phone number:  [phone] Fax number:  [phone]   Type of Clearance Requested:   - Medical    Type of Anesthesia:  Spinal   Additional requests/questions:    Signed, Lauris Port   07/28/2023, 12:15 PM

## 2023-07-28 NOTE — Telephone Encounter (Signed)
    Primary Cardiologist:None  Chart reviewed as part of pre-operative protocol coverage. Because of [name]'s past medical history and time since last visit, he/she will require a follow-up visit in order to better assess preoperative cardiovascular risk.  Pre-op covering staff: - Please schedule  in office appointment and call patient to inform them. - Please contact requesting surgeon's office via preferred method (i.e, phone, fax) to inform them of need for appointment prior to surgery.  If applicable, this message will also be routed to pharmacy pool and/or primary cardiologist for input on holding anticoagulant/antiplatelet agent as requested below so that this information is available at time of patient's appointment.   Carie Charity, NP  07/28/2023, 1:19 PM

## 2023-07-29 NOTE — Telephone Encounter (Signed)
 I s/w the pt and offered a new pt appt with Dr. Renna Cary 08/08/23 @ 9:40 ok per Dr. Renna Cary RN Pam.   Pt thanked me for the call and the help. I will cancel the new pt appt set for Dr. Veryl Gottron for 10/2023. I will update the surgeon office as well of new pt appt moved up sooner. Pt said she is in the car and could not write down the address and new MD she will see. Pt does have MY CHART. I stated that I will send information to her thru MY CHART.

## 2023-08-08 ENCOUNTER — Ambulatory Visit: Attending: Cardiology | Admitting: Cardiology

## 2023-08-08 ENCOUNTER — Encounter: Payer: Self-pay | Admitting: Cardiology

## 2023-08-08 VITALS — BP 134/78 | HR 62 | Resp 16 | Ht 61.0 in | Wt 187.6 lb

## 2023-08-08 DIAGNOSIS — F3130 Bipolar disorder, current episode depressed, mild or moderate severity, unspecified: Secondary | ICD-10-CM | POA: Diagnosis not present

## 2023-08-08 DIAGNOSIS — E785 Hyperlipidemia, unspecified: Secondary | ICD-10-CM | POA: Diagnosis not present

## 2023-08-08 DIAGNOSIS — Z01818 Encounter for other preprocedural examination: Secondary | ICD-10-CM

## 2023-08-08 DIAGNOSIS — I2510 Atherosclerotic heart disease of native coronary artery without angina pectoris: Secondary | ICD-10-CM

## 2023-08-08 DIAGNOSIS — J439 Emphysema, unspecified: Secondary | ICD-10-CM | POA: Diagnosis not present

## 2023-08-08 DIAGNOSIS — R0609 Other forms of dyspnea: Secondary | ICD-10-CM | POA: Diagnosis not present

## 2023-08-08 DIAGNOSIS — I10 Essential (primary) hypertension: Secondary | ICD-10-CM | POA: Diagnosis not present

## 2023-08-08 NOTE — Patient Instructions (Signed)
 Medication Instructions:  The current medical regimen is effective;  continue present plan and medications.  *If you need a refill on your cardiac medications before your next appointment, please call your pharmacy*   Testing/Procedures: You are scheduled for a Lexiscan Myocardial Perfusion Imaging Study on      at     .   Please arrive 15 minutes prior to your appointment time for registration and insurance purposes.   The test will take approximately 3 to 4 hours to complete; you may bring reading material. If someone comes with you to your appointment, they will need to remain in the main lobby due to limited space in the testing area.   If you are pregnant or breastfeeding, please notify the nuclear lab prior to your appointment.   How to prepare for your Lexiscan Myocardial Perfusion test:   Do not eat or drink 3 hours prior to your test, except you may have water.    Do not consume products containing caffeine  (regular or decaffeinated) 12 hours prior to your test (ex: coffee, chocolate, soda, tea)   Do bring a list of your current medications with you. If not listed below, you may take your medications as normal.   Bring any held medication to your appointment, as you may be required to take it once the test is complete.   Do wear comfortable clothes (no dresses or overalls) and walking shoes. Tennis shoes are preferred. No heels or open toed shoes.  Do not wear cologne, perfume, aftershave or lotions (deodorant is allowed).   If these instructions are not followed, you test will have to be rescheduled.   Please report to [facility] for your test. If you have questions or concerns about your appointment, please call the Nuclear Lab at #[phone].  If you cannot keep your appointment, please provide 24 hour notification to the Nuclear lab to avoid a possible $50 charge to your account.   Follow-Up: At [facility], you and your health needs are our  priority.  As part of our continuing mission to provide you with exceptional heart care, our providers are all part of one team.  This team includes your primary Cardiologist (physician) and Advanced Practice Providers or APPs (Physician Assistants and Nurse Practitioners) who all work together to provide you with the care you need, when you need it.  Your next appointment:   Follow up will be as needed.  We recommend signing up for the patient portal called MyChart.  Sign up information is provided on this After Visit Summary.  MyChart is used to connect with patients for Virtual Visits (Telemedicine).  Patients are able to view lab/test results, encounter notes, upcoming appointments, etc.  Non-urgent messages can be sent to your provider as well.   To learn more about what you can do with MyChart, go to [email or web address].

## 2023-08-08 NOTE — Progress Notes (Signed)
 Cardiology Office Note:  .   Date:  08/08/2023  ID:  [name], DOB 06/28/1945, MRN [id number] PCP: Cindy Clotilda HERO, DO (Inactive)  [facility] Providers Cardiologist:  None     History of Present Illness: .   [name] is a 78 y.o. female Discussed the use of AI scribe software for clinical note transcription with the patient, who gave verbal consent to proceed.  History of Present Illness [name] is a 78 year old female with coronary artery disease who presents for preoperative risk evaluation for right total knee arthroplasty. She was referred by Dr. Sidra Olmsted for preoperative risk evaluation for knee arthroplasty.  She is undergoing a preoperative risk evaluation for a right total knee arthroplasty. Initially, she mentioned a left knee arthroplasty but clarified that the right knee is the one causing more problems and is scheduled for surgery. This evaluation is to ensure she can safely proceed with the surgery from a cardiac perspective.  A recent echocardiogram on May 16, 2023, showed normal pump function with an ejection fraction of 60-65% and grade one diastolic dysfunction. Trivial mitral valve regurgitation was noted. A high-resolution CT of the chest showed calcification of the aorta and coronary arteries. No severe chest pain or history of myocardial infarction.  She has a family history of coronary artery disease and myocardial infarction in her father. 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  She is currently on Zetia  10 mg daily and fenofibrate 145 mg daily for cholesterol management. Her triglycerides are 106, and LDL is 119. She has had issues with statins in the past, causing leg pain, which is why she is on her current regimen.  She has a history of COPD and uses a spinal stimulator for back pain management, having undergone multiple back  surgeries. No history of myocardial infarction, cerebrovascular accident, or diabetes. No wheezing noted.      ROS: No syncope  Studies Reviewed: [name]   EKG Interpretation Date/Time:  Monday August 08 2023 09:30:06 EDT Ventricular Rate:  63 PR Interval:  190 QRS Duration:  118 QT Interval:  410 QTC Calculation: 419 R Axis:   -35  Text Interpretation: Normal sinus rhythm Left axis deviation Incomplete right bundle branch block Cannot rule out Anterior infarct , age undetermined When compared with ECG of 24-May-2016 12:23, Minimal criteria for Anterior infarct are now Present Confirmed by Jeffrie Anes ([id number]) on 08/08/2023 9:40:56 AM    Results LABS Triglycerides: 106 LDL: 119  RADIOLOGY High-resolution chest CT: Calcification of the aorta and coronary arteries, personally reviewed  DIAGNOSTIC Echocardiogram: Normal pump function, EF 60-65%, grade 1 diastolic dysfunction, trivial mitral valve regurgitation (05/16/2023) Risk Assessment/Calculations:            Physical Exam:   VS:  BP 134/78 (BP Location: Left Arm, Patient Position: Sitting, Cuff Size: Large)   Pulse 62   Resp 16   Ht 5' 1 (1.549 m)   Wt 187 lb 9.6 oz (85.1 kg)   SpO2 98%   BMI 35.45 kg/m    Wt Readings from Last 3 Encounters:  08/08/23 187 lb 9.6 oz (85.1 kg)  07/07/23 184 lb (83.5 kg)  06/23/23 184 lb 6.4 oz (83.6 kg)    GEN: Sitting in wheelchair, Well nourished, well developed in no acute distress NECK: No JVD; No carotid bruits CARDIAC: RRR, no murmurs, no rubs, no  gallops RESPIRATORY:  Clear to auscultation without rales, wheezing or rhonchi  ABDOMEN: Soft, non-tender, non-distended EXTREMITIES:  No edema; No deformity   ASSESSMENT AND PLAN: .    Assessment and Plan Assessment & Plan Coronary artery disease with calcified plaque Presence of calcified plaque in the coronary arteries as seen on high-resolution CT. No current symptoms of severe chest pain or myocardial infarction. No history of  myocardial infarction or cerebrovascular accident. - Order SPECT nuclear stress test to evaluate coronary artery disease severity preoperatively.  Preoperative evaluation for knee arthroplasty Preoperative cardiac evaluation for right total knee arthroplasty. Ensuring cardiac stability and safety for surgery with stress test due to coronary artery calcification. - Schedule follow-up after stress test results to determine readiness for knee surgery.  Grade 1 diastolic dysfunction Echocardiogram shows grade 1 diastolic dysfunction with normal ejection fraction (60-65%) and trivial mitral valve regurgitation. Cardiac function is not dangerously impaired.  Chronic obstructive pulmonary disease (COPD) COPD is present but not severe enough to contraindicate surgery. No current wheezing or respiratory distress.  Hyperlipidemia Managed with Zetia  10 mg daily and fenofibrate 145 mg daily. LDL is 119 mg/dL and triglycerides are 893 mg/dL. Statins are not used due to muscle pain side effects.  Obesity Weight has decreased from 271 lbs with the help of We[employer] . Continued weight management is important for overall health and surgical outcomes.  Alcohol  dependence 36 years of sobriety as of August 26, 2023. No current issues related to alcohol  dependence.  Nicotine dependence Quit smoking in 1988 after 21 years of heavy smoking. No current issues related to nicotine dependence.       Informed Consent   Shared Decision Making/Informed Consent The risks [chest pain, shortness of breath, cardiac arrhythmias, dizziness, blood pressure fluctuations, myocardial infarction, stroke/transient ischemic attack, nausea, vomiting, allergic reaction, radiation exposure, metallic taste sensation and life-threatening complications (estimated to be 1 in 10,000)], benefits (risk stratification, diagnosing coronary artery disease, treatment guidance) and alternatives of a nuclear stress test were discussed in detail with  [name] and she agrees to proceed.     Dispo: PRN  Signed, Oneil Parchment, MD

## 2023-08-10 ENCOUNTER — Telehealth (HOSPITAL_COMMUNITY): Payer: Self-pay | Admitting: *Deleted

## 2023-08-10 ENCOUNTER — Other Ambulatory Visit: Payer: Self-pay | Admitting: Cardiology

## 2023-08-10 DIAGNOSIS — Z01818 Encounter for other preprocedural examination: Secondary | ICD-10-CM

## 2023-08-10 DIAGNOSIS — R0609 Other forms of dyspnea: Secondary | ICD-10-CM

## 2023-08-10 NOTE — Telephone Encounter (Signed)
 Patient given detailed instructions per Myocardial Perfusion Study Information Sheet for the test on 08/15/2023 at 10:15. Patient notified to arrive 15 minutes early and that it is imperative to arrive on time for appointment to keep from having the test rescheduled.  If you need to cancel or reschedule your appointment, please call the office within 24 hours of your appointment. . Patient verbalized understanding.[name]

## 2023-08-15 ENCOUNTER — Ambulatory Visit (HOSPITAL_COMMUNITY)
Admission: RE | Admit: 2023-08-15 | Discharge: 2023-08-15 | Disposition: A | Discharge status: Home / Self Care | Source: Ambulatory Visit | Attending: Cardiology | Admitting: Cardiology

## 2023-08-15 DIAGNOSIS — Z01810 Encounter for preprocedural cardiovascular examination: Secondary | ICD-10-CM

## 2023-08-15 DIAGNOSIS — R0609 Other forms of dyspnea: Secondary | ICD-10-CM | POA: Diagnosis not present

## 2023-08-15 DIAGNOSIS — Z01818 Encounter for other preprocedural examination: Secondary | ICD-10-CM | POA: Insufficient documentation

## 2023-08-15 LAB — MYOCARDIAL PERFUSION IMAGING
LV dias vol: 81 mL (ref 46–106)
LV sys vol: 15 mL (ref 3.8–5.2)
Nuc Stress EF: 81 %
Peak HR: 80 {beats}/min
Rest HR: 66 {beats}/min
Rest Nuclear Isotope Dose: 10.9 mCi
SDS: 2
SRS: 0
SSS: 2
ST Depression (mm): 0 mm
Stress Nuclear Isotope Dose: 32.4 mCi
TID: 1.08

## 2023-08-15 MED ORDER — REGADENOSON 0.4 MG/5ML IV SOLN
0.4000 mg | Freq: Once | INTRAVENOUS | Status: AC
  Administered 2023-08-15: 0.4 mg via INTRAVENOUS

## 2023-08-15 MED ORDER — TECHNETIUM TC 99M TETROFOSMIN IV KIT
32.4000 | PACK | Freq: Once | INTRAVENOUS | Status: AC | PRN
  Administered 2023-08-15: 32.4 via INTRAVENOUS

## 2023-08-15 MED ORDER — REGADENOSON 0.4 MG/5ML IV SOLN
INTRAVENOUS | Status: AC
  Filled 2023-08-15: qty 5

## 2023-08-15 MED ORDER — TECHNETIUM TC 99M TETROFOSMIN IV KIT
10.9000 | PACK | Freq: Once | INTRAVENOUS | Status: AC | PRN
  Administered 2023-08-15: 10.9 via INTRAVENOUS

## 2023-08-16 ENCOUNTER — Ambulatory Visit: Payer: Self-pay | Admitting: Cardiology

## 2023-08-16 DIAGNOSIS — N1831 Chronic kidney disease, stage 3a: Secondary | ICD-10-CM | POA: Diagnosis not present

## 2023-08-16 DIAGNOSIS — I10 Essential (primary) hypertension: Secondary | ICD-10-CM | POA: Diagnosis not present

## 2023-08-16 DIAGNOSIS — I890 Lymphedema, not elsewhere classified: Secondary | ICD-10-CM | POA: Diagnosis not present

## 2023-08-16 DIAGNOSIS — K5900 Constipation, unspecified: Secondary | ICD-10-CM | POA: Diagnosis not present

## 2023-08-16 DIAGNOSIS — Z6833 Body mass index (BMI) 33.0-33.9, adult: Secondary | ICD-10-CM | POA: Diagnosis not present

## 2023-08-16 DIAGNOSIS — R1319 Other dysphagia: Secondary | ICD-10-CM | POA: Diagnosis not present

## 2023-08-16 DIAGNOSIS — F3130 Bipolar disorder, current episode depressed, mild or moderate severity, unspecified: Secondary | ICD-10-CM | POA: Diagnosis not present

## 2023-08-30 DIAGNOSIS — K5904 Chronic idiopathic constipation: Secondary | ICD-10-CM | POA: Diagnosis not present

## 2023-08-30 DIAGNOSIS — K219 Gastro-esophageal reflux disease without esophagitis: Secondary | ICD-10-CM | POA: Diagnosis not present

## 2023-08-30 DIAGNOSIS — R1310 Dysphagia, unspecified: Secondary | ICD-10-CM | POA: Diagnosis not present

## 2023-08-31 ENCOUNTER — Encounter: Payer: Self-pay | Admitting: Gastroenterology

## 2023-08-31 ENCOUNTER — Other Ambulatory Visit: Payer: Self-pay | Admitting: Gastroenterology

## 2023-08-31 DIAGNOSIS — R1311 Dysphagia, oral phase: Secondary | ICD-10-CM

## 2023-09-02 DIAGNOSIS — M1711 Unilateral primary osteoarthritis, right knee: Secondary | ICD-10-CM | POA: Diagnosis not present

## 2023-09-05 NOTE — Progress Notes (Signed)
 Sent message, via epic in basket, requesting orders in epic from Careers adviser.

## 2023-09-06 NOTE — Progress Notes (Addendum)
 Anesthesia Review:  PCP: Chiquita Maize with [facility] at [facility]  Cardiologist : Jeffrie- LOV 08/08/23  Clearance in 08/16/23 note.    PPM/ ICD: Device Orders: Rep Notified:  Chest x-ray : CT chest- 05/27/23  PFT- 06/26/23  EKG : 6/30./25  Echo : 05/16/23  Stress test: 08/15/23  Cardiac Cath :   Activity level: cannot do a flight of stairs without difficulty  Sleep Study/ CPAP : none  Fasting Blood Sugar :      / Checks Blood Sugar -- times a day:     Prediabetes- on no meds does not check glucose at home   Wegovy - Last dose approx 08/08/2023 per pt   Blood Thinner/ Instructions /Last Dose: ASA / Instructions/ Last Dose :    Has Spinal Cord Stimulator- To bring remote DOS   PT in wheelchair.    PT states during back srugery at [facility] approx 5 years ago she woke up during surgery.    Scheduled for Endo on 09/13/2023 with Dr Rollin at [facility]    PT brought with her to preop on 09/12/23 handwritten list of all surgeries.  List in chart and amended in hx.     CBC done 09/12/23  with white count of 14.8 routed to DR Landau on 09/12/23.  Burnard Odis BARRE aware on 09/12/23.

## 2023-09-07 NOTE — Patient Instructions (Addendum)
 SURGICAL WAITING ROOM VISITATION  Patients having surgery or a procedure may have no more than 2 support people in the waiting area - these visitors may rotate.    Children under the age of 46 must have an adult with them who is not the patient.  Visitors with respiratory illnesses are discouraged from visiting and should remain at home.  If the patient needs to stay at the hospital during part of their recovery, the visitor guidelines for inpatient rooms apply. Pre-op nurse will coordinate an appropriate time for 1 support person to accompany patient in pre-op.  This support person may not rotate.    Please refer to the [facility] website for the visitor guidelines for Inpatients (after your surgery is over and you are in a regular room).       Your procedure is scheduled on:  09/20/2023    Report to [facility] Main Entrance    Report to admitting at   0930AM   Call this number if you have problems the morning of surgery [phone]   Do not eat food :After Midnight.   After Midnight you may have the following liquids until ___ 0900___ AM  DAY OF SURGERY  Water Non-Citrus Juices (without pulp, NO RED-Apple, White grape, White cranberry) Black Coffee (NO MILK/CREAM OR CREAMERS, sugar ok)  Clear Tea (NO MILK/CREAM OR CREAMERS, sugar ok) regular and decaf                             Plain Jell-O (NO RED)                                           Fruit ices (not with fruit pulp, NO RED)                                     Popsicles (NO RED)                                                               Sports drinks like Gatorade (NO RED)             .        The day of surgery:  Drink ONE (1) Pre-Surgery Clear Ensure or G2 at   0900AM the morning of surgery. Drink in one sitting. Do not sip.  This drink was given to you during your hospital  pre-op appointment visit. Nothing else to drink after completing the  Pre-Surgery Clear Ensure or G2.          If you have  questions, please contact your surgeon's office.       Oral Hygiene is also important to reduce your risk of infection.                                    Remember - BRUSH YOUR TEETH THE MORNING OF SURGERY WITH YOUR REGULAR TOOTHPASTE  DENTURES WILL BE REMOVED PRIOR TO SURGERY PLEASE DO NOT APPLY Poly  grip OR ADHESIVES!!!   Do NOT smoke after Midnight   Stop all vitamins and herbal supplements 7 days before surgery.   Take these medicines the morning of surgery with A SIP OF WATER:  inhalers as usuald nd bring, xanax , lyrica , eye drops as usual , flonase  if needed, nexium,             Wegovy - Last dose on   DO NOT TAKE ANY ORAL DIABETIC MEDICATIONS DAY OF YOUR SURGERY  Bring CPAP mask and tubing day of surgery.                              You may not have any metal on your body including hair pins, jewelry, and body piercing             Do not wear make-up, lotions, powders, perfumes/cologne, or deodorant  Do not wear nail polish including gel and S&S, artificial/acrylic nails, or any other type of covering on natural nails including finger and toenails. If you have artificial nails, gel coating, etc. that needs to be removed by a nail salon please have this removed prior to surgery or surgery may need to be canceled/ delayed if the surgeon/ anesthesia feels like they are unable to be safely monitored.   Do not shave  48 hours prior to surgery.               Men may shave face and neck.   Do not bring valuables to the hospital. [facility] IS NOT             RESPONSIBLE   FOR VALUABLES.   Contacts, glasses, dentures or bridgework may not be worn into surgery.   Bring small overnight bag day of surgery.   DO NOT BRING YOUR HOME MEDICATIONS TO THE HOSPITAL. PHARMACY WILL DISPENSE MEDICATIONS LISTED ON YOUR MEDICATION LIST TO YOU DURING YOUR ADMISSION IN THE HOSPITAL!    Patients discharged on the day of surgery will not be allowed to drive home.  Someone NEEDS to stay with  you for the first 24 hours after anesthesia.   Special Instructions: Bring a copy of your healthcare power of attorney and living will documents the day of surgery if you haven't scanned them before.              Please read over the following fact sheets you were given: IF YOU HAVE QUESTIONS ABOUT YOUR PRE-OP INSTRUCTIONS PLEASE CALL [phone].   If you received a COVID test during your pre-op visit  it is requested that you wear a mask when out in public, stay away from anyone that may not be feeling well and notify your surgeon if you develop symptoms. If you test positive for Covid or have been in contact with anyone that has tested positive in the last 10 days please notify you surgeon.      Pre-operative 5 CHG Bath Instructions   You can play a key role in reducing the risk of infection after surgery. Your skin needs to be as free of germs as possible. You can reduce the number of germs on your skin by washing with CHG (chlorhexidine  gluconate) soap before surgery. CHG is an antiseptic soap that kills germs and continues to kill germs even after washing.   DO NOT use if you have an allergy to chlorhexidine /CHG or antibacterial soaps. If your skin becomes reddened or irritated, stop using the CHG and notify  one of our RNs at [phone].   Please shower with the CHG soap starting 4 days before surgery using the following schedule:     Please keep in mind the following:  DO NOT shave, including legs and underarms, starting the day of your first shower.   You may shave your face at any point before/day of surgery.  Place clean sheets on your bed the day you start using CHG soap. Use a clean washcloth (not used since being washed) for each shower. DO NOT sleep with pets once you start using the CHG.   CHG Shower Instructions:  If you choose to wash your hair and private area, wash first with your normal shampoo/soap.  After you use shampoo/soap, rinse your hair and body thoroughly to  remove shampoo/soap residue.  Turn the water OFF and apply about 3 tablespoons (45 ml) of CHG soap to a CLEAN washcloth.  Apply CHG soap ONLY FROM YOUR NECK DOWN TO YOUR TOES (washing for 3-5 minutes)  DO NOT use CHG soap on face, private areas, open wounds, or sores.  Pay special attention to the area where your surgery is being performed.  If you are having back surgery, having someone wash your back for you may be helpful. Wait 2 minutes after CHG soap is applied, then you may rinse off the CHG soap.  Pat dry with a clean towel  Put on clean clothes/pajamas   If you choose to wear lotion, please use ONLY the CHG-compatible lotions on the back of this paper.     Additional instructions for the day of surgery: DO NOT APPLY any lotions, deodorants, cologne, or perfumes.   Put on clean/comfortable clothes.  Brush your teeth.  Ask your nurse before applying any prescription medications to the skin.      CHG Compatible Lotions   Aveeno Moisturizing lotion  Cetaphil Moisturizing Cream  Cetaphil Moisturizing Lotion  Clairol Herbal Essence Moisturizing Lotion, Dry Skin  Clairol Herbal Essence Moisturizing Lotion, Extra Dry Skin  Clairol Herbal Essence Moisturizing Lotion, Normal Skin  Curel Age Defying Therapeutic Moisturizing Lotion with Alpha Hydroxy  Curel Extreme Care Body Lotion  Curel Soothing Hands Moisturizing Hand Lotion  Curel Therapeutic Moisturizing Cream, Fragrance-Free  Curel Therapeutic Moisturizing Lotion, Fragrance-Free  Curel Therapeutic Moisturizing Lotion, Original Formula  Eucerin Daily Replenishing Lotion  Eucerin Dry Skin Therapy Plus Alpha Hydroxy Crme  Eucerin Dry Skin Therapy Plus Alpha Hydroxy Lotion  Eucerin Original Crme  Eucerin Original Lotion  Eucerin Plus Crme Eucerin Plus Lotion  Eucerin TriLipid Replenishing Lotion  Keri Anti-Bacterial Hand Lotion  Keri Deep Conditioning Original Lotion Dry Skin Formula Softly Scented  Keri Deep Conditioning  Original Lotion, Fragrance Free Sensitive Skin Formula  Keri Lotion Fast Absorbing Fragrance Free Sensitive Skin Formula  Keri Lotion Fast Absorbing Softly Scented Dry Skin Formula  Keri Original Lotion  Keri Skin Renewal Lotion Keri Silky Smooth Lotion  Keri Silky Smooth Sensitive Skin Lotion  Nivea Body Creamy Conditioning Oil  Nivea Body Extra Enriched Teacher, adult education Moisturizing Lotion Nivea Crme  Nivea Skin Firming Lotion  NutraDerm 30 Skin Lotion  NutraDerm Skin Lotion  NutraDerm Therapeutic Skin Cream  NutraDerm Therapeutic Skin Lotion  ProShield Protective Hand Cream  Provon moisturizing lotion

## 2023-09-07 NOTE — Care Plan (Signed)
 Ortho Bundle Case Management Note  Patient Details  Name: [name] MRN: [id number] Date of Birth: August 12, 1945  spoke with patient on the phone. she has help at home but cannot discharge to the home setting due to the environment. will stay in the hospital with anticipation of a SNF stay post op. she met with PA last week and this was discussed in detail. all are in agreement                   DME Arranged:    DME Agency:     HH Arranged:    HH Agency:     Additional Comments: Please contact me with any questions of if this plan should need to change.  Charlies Pitch,  RN,BSN,MHA,CCM  [facility] Orthopaedic Specialist  [phone] 09/07/2023, 3:11 PM

## 2023-09-08 ENCOUNTER — Ambulatory Visit
Admission: RE | Admit: 2023-09-08 | Discharge: 2023-09-08 | Disposition: A | Discharge status: Home / Self Care | Source: Ambulatory Visit | Attending: Gastroenterology | Admitting: Gastroenterology

## 2023-09-08 ENCOUNTER — Other Ambulatory Visit: Payer: Self-pay | Admitting: Gastroenterology

## 2023-09-08 DIAGNOSIS — F3130 Bipolar disorder, current episode depressed, mild or moderate severity, unspecified: Secondary | ICD-10-CM | POA: Diagnosis not present

## 2023-09-08 DIAGNOSIS — K220 Achalasia of cardia: Secondary | ICD-10-CM | POA: Diagnosis not present

## 2023-09-08 DIAGNOSIS — I10 Essential (primary) hypertension: Secondary | ICD-10-CM | POA: Diagnosis not present

## 2023-09-08 DIAGNOSIS — R1311 Dysphagia, oral phase: Secondary | ICD-10-CM

## 2023-09-08 DIAGNOSIS — R1310 Dysphagia, unspecified: Secondary | ICD-10-CM | POA: Diagnosis not present

## 2023-09-08 DIAGNOSIS — E785 Hyperlipidemia, unspecified: Secondary | ICD-10-CM | POA: Diagnosis not present

## 2023-09-09 ENCOUNTER — Other Ambulatory Visit: Payer: Self-pay | Admitting: Gastroenterology

## 2023-09-12 ENCOUNTER — Encounter (HOSPITAL_COMMUNITY)
Admission: RE | Admit: 2023-09-12 | Discharge: 2023-09-12 | Disposition: A | Discharge status: Home / Self Care | Source: Ambulatory Visit | Attending: Orthopedic Surgery

## 2023-09-12 ENCOUNTER — Encounter (HOSPITAL_COMMUNITY): Payer: Self-pay | Admitting: Gastroenterology

## 2023-09-12 ENCOUNTER — Other Ambulatory Visit: Payer: Self-pay

## 2023-09-12 ENCOUNTER — Encounter (HOSPITAL_COMMUNITY): Payer: Self-pay

## 2023-09-12 VITALS — BP 119/70 | HR 58 | Temp 98.6°F | Resp 16 | Ht 61.0 in

## 2023-09-12 DIAGNOSIS — I890 Lymphedema, not elsewhere classified: Secondary | ICD-10-CM | POA: Diagnosis not present

## 2023-09-12 DIAGNOSIS — J449 Chronic obstructive pulmonary disease, unspecified: Secondary | ICD-10-CM | POA: Insufficient documentation

## 2023-09-12 DIAGNOSIS — G8929 Other chronic pain: Secondary | ICD-10-CM | POA: Insufficient documentation

## 2023-09-12 DIAGNOSIS — F319 Bipolar disorder, unspecified: Secondary | ICD-10-CM | POA: Diagnosis not present

## 2023-09-12 DIAGNOSIS — F1120 Opioid dependence, uncomplicated: Secondary | ICD-10-CM | POA: Insufficient documentation

## 2023-09-12 DIAGNOSIS — Z87891 Personal history of nicotine dependence: Secondary | ICD-10-CM | POA: Diagnosis not present

## 2023-09-12 DIAGNOSIS — R7303 Prediabetes: Secondary | ICD-10-CM | POA: Diagnosis not present

## 2023-09-12 DIAGNOSIS — Z01818 Encounter for other preprocedural examination: Secondary | ICD-10-CM

## 2023-09-12 DIAGNOSIS — I2510 Atherosclerotic heart disease of native coronary artery without angina pectoris: Secondary | ICD-10-CM | POA: Insufficient documentation

## 2023-09-12 DIAGNOSIS — Z86718 Personal history of other venous thrombosis and embolism: Secondary | ICD-10-CM | POA: Insufficient documentation

## 2023-09-12 DIAGNOSIS — Z01812 Encounter for preprocedural laboratory examination: Secondary | ICD-10-CM | POA: Insufficient documentation

## 2023-09-12 DIAGNOSIS — K220 Achalasia of cardia: Secondary | ICD-10-CM | POA: Diagnosis not present

## 2023-09-12 DIAGNOSIS — Z9682 Presence of neurostimulator: Secondary | ICD-10-CM | POA: Insufficient documentation

## 2023-09-12 DIAGNOSIS — M797 Fibromyalgia: Secondary | ICD-10-CM | POA: Insufficient documentation

## 2023-09-12 DIAGNOSIS — Z981 Arthrodesis status: Secondary | ICD-10-CM | POA: Diagnosis not present

## 2023-09-12 DIAGNOSIS — E669 Obesity, unspecified: Secondary | ICD-10-CM | POA: Insufficient documentation

## 2023-09-12 DIAGNOSIS — K219 Gastro-esophageal reflux disease without esophagitis: Secondary | ICD-10-CM | POA: Diagnosis not present

## 2023-09-12 HISTORY — DX: Anemia, unspecified: D64.9

## 2023-09-12 HISTORY — DX: Other complications of anesthesia, initial encounter: T88.59XA

## 2023-09-12 HISTORY — DX: Prediabetes: R73.03

## 2023-09-12 HISTORY — DX: Malignant (primary) neoplasm, unspecified: C80.1

## 2023-09-12 HISTORY — DX: Chronic kidney disease, unspecified: N18.9

## 2023-09-12 HISTORY — DX: Peripheral vascular disease, unspecified: I73.9

## 2023-09-12 LAB — CBC
HCT: 41.6 % (ref 36.0–46.0)
Hemoglobin: 13.3 g/dL (ref 12.0–15.0)
MCH: 28.9 pg (ref 26.0–34.0)
MCHC: 32 g/dL (ref 30.0–36.0)
MCV: 90.4 fL (ref 80.0–100.0)
Platelets: 257 K/uL (ref 150–400)
RBC: 4.6 MIL/uL (ref 3.87–5.11)
RDW: 14.5 % (ref 11.5–15.5)
WBC: 14.8 K/uL — ABNORMAL HIGH (ref 4.0–10.5)
nRBC: 0 % (ref 0.0–0.2)

## 2023-09-12 LAB — BASIC METABOLIC PANEL WITH GFR
Anion gap: 10 (ref 5–15)
BUN: 20 mg/dL (ref 8–23)
CO2: 27 mmol/L (ref 22–32)
Calcium: 9.1 mg/dL (ref 8.9–10.3)
Chloride: 103 mmol/L (ref 98–111)
Creatinine, Ser: 1.06 mg/dL — ABNORMAL HIGH (ref 0.44–1.00)
GFR, Estimated: 54 mL/min — ABNORMAL LOW (ref >60)
Glucose, Bld: 93 mg/dL (ref 70–99)
Potassium: 4.7 mmol/L (ref 3.5–5.1)
Sodium: 140 mmol/L (ref 135–145)

## 2023-09-12 LAB — SURGICAL PCR SCREEN
MRSA, PCR: NEGATIVE
Staphylococcus aureus: NEGATIVE

## 2023-09-12 NOTE — Progress Notes (Signed)
 Attempted to obtain medical history for pre op call via telephone, unable to reach at this time. HIPAA compliant voicemail message left requesting return call to pre surgical testing department.

## 2023-09-12 NOTE — Progress Notes (Signed)
 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  Prior anesthesia complications include PONV, post op spinal HA, intra-op awareness (during back surgery ~2018)  Of note patient is scheduled for EGD on 09/13/23 with Dr. Rollin and R TKA on 09/20/23 with Dr. Josefina  Patient seen by Cardiology for pre op clearance on 08/08/23. Patient recommended to undergo stress testing due to not being able to complete 4 METS and evidence of CAD on chest CT. Stress test done 08/15/23 and was low risk. She was cleared for surgery from cardiac perspective  Patient follows with Pulmonology for ILD and COPD. Last seen in clinic on 07/07/23. Recent spirometry with no significant airflow obstruction or restriction. Advised use albuterol  prn. 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  Patient has minimum symptom burden with no significant cough.  Does have some shortness of breath but very sedentary and deconditioned.  Advised f/u in 6 months.  LD Wegovy : 6/30  VS: BP 119/70   Pulse (!) 58   Temp 37 C (Oral)   Resp 16   Ht 5' 1 (1.549 m)   SpO2 98%   BMI 35.45 kg/m   PROVIDERS: Stamey, Chiquita POUR, FNP   LABS: Leukocytosis noted. Unclear etiology. Patient denies infectious symptoms at PAT visit. Results routed to Dr. Josefina (all labs ordered are listed, but only abnormal results are  displayed)  Labs Reviewed  BASIC METABOLIC PANEL WITH GFR - Abnormal; Notable for the following components:      Result Value   Creatinine, Ser 1.06 (*)    GFR, Estimated 54 (*)    All other components within normal limits  CBC - Abnormal; Notable for the following components:   WBC 14.8 (*)    All other components within normal limits  SURGICAL PCR SCREEN     IMAGES: CT Chest 05/04/23:   IMPRESSION: 1. Mild basilar predominant subpleural reticular densities and ground-glass with bronchiectasis/bronchiolectasis, slightly progressive from 10/16/2015. Findings are categorized as probable UIP per consensus guidelines: Diagnosis of Idiopathic Pulmonary Fibrosis: An Official ATS/ERS/JRS/ALAT Clinical Practice Guideline. Am JINNY [employer] Crit Care Med Vol 198, Iss 5, [phone], Oct 09 2016. 2. Mild air trapping is indicative of small airways disease.      EKG 08/08/23:  Normal sinus rhythm, rate 62 Left axis deviation Incomplete right bundle branch block Cannot rule out Anterior infarct , age undetermined  CV:  Stress test 08/15/23:    Findings are consistent with no ischemia. The study is low risk.   No ST deviation was noted. The ECG was not diagnostic due to pharmacologic protocol.   LV perfusion is normal.   Left ventricular function is normal. Nuclear stress EF: 81%. The left ventricular ejection fraction is hyperdynamic (>65%). End diastolic cavity size is normal. End systolic cavity size is normal.   CT images were obtained for attenuation correction and were examined for the presence  of coronary calcium  when appropriate.   Coronary calcium  was absent on the attenuation correction CT images.   Prior study not available for comparison.   ECG rhythm shows normal sinus rhythm. ECG demonstrates right bundle branch block.   No significant reversible ischemia. LVEF 81% with mild septal dyskinesis. Overall low risk study. No prior for comparison.  Echo 05/16/23:  IMPRESSIONS     1. Left ventricular ejection fraction, by estimation, is 60 to 65%. Left ventricular ejection fraction by 3D volume is 64 %. The left ventricle has normal function. The left ventricle has no regional wall motion abnormalities. Left ventricular diastolic  parameters are consistent with Grade I diastolic dysfunction (impaired relaxation).  2. Right ventricular systolic function is normal. The right ventricular size is normal. Tricuspid regurgitation signal is inadequate for assessing PA pressure.  3. The mitral valve is normal in structure. Trivial mitral valve regurgitation. No evidence of mitral stenosis.  4. The aortic valve is tricuspid. Aortic valve regurgitation is not visualized. No aortic stenosis is present.  5. The inferior vena cava is normal in size with greater than 50% respiratory variability, suggesting right atrial pressure of 3 mmHg.  Past Medical History:  Diagnosis Date   Anemia    Anesthesia complication    woke up during back surgery at [facility] 2020   Anxiety    Bipolar affective disorder (HCC)    Cancer (HCC)    skin cancer   Chronic kidney disease    stage 3 kidney disease   COPD (chronic obstructive pulmonary disease) (HCC)    Cough    Depression    DJD (degenerative joint disease) of lumbar spine    DVT (deep venous thrombosis) (HCC) 2010   groin left - on coumadin for 6 months   Family history of anesthesia complication    Hx: father has nausea   Fibromyalgia    GERD (gastroesophageal reflux disease)    Left leg DVT (HCC) 2010   Lymphedema 2019   Migraine    Peripheral vascular disease (HCC)    Pneumonia    PONV (postoperative nausea and vomiting)    and migraines   Pre-diabetes    Shortness of breath    exertion   Spinal cord stimulator dysfunction (HCC)    has one in,but not working   Spinal headache    Vertigo    Hx: of   Vocal cord dysfunction    sees dr. shelah    Past Surgical History:  Procedure Laterality Date   ABDOMINAL  HYSTERECTOMY  1980   partial    ANTERIOR LAT LUMBAR FUSION Left 02/15/2014   Procedure: ANTERIOR LATERAL LUMBAR INTERBODY FUSION LUMBAR TWO-THREE,LUMBAR THREE-FOUR WITH LATERAL PLATE.;  Surgeon: Victory DELENA Gunnels, MD;  [facility];  Service: Neurosurgery;  Laterality: Left;  left    APPENDECTOMY     BACK SURGERY     BLEPHAROPLASTY     bilateral   BREAST ENHANCEMENT SURGERY  1989   CARPAL TUNNEL RELEASE  2006   Right hand   CATARACT EXTRACTION W/ INTRAOCULAR LENS  IMPLANT, BILATERAL     Hx: of   CERVICAL FUSION  2005   CHOLECYSTECTOMY  1989   COLONOSCOPY W/ BIOPSIES AND POLYPECTOMY     Hx: of   cyst removed from right thumb      DENTAL RESTORATION/EXTRACTION WITH X-RAY     16 teeth removed due to osteonecrosis due to fosamax   eyebrow and eyelid lift      2024  FINGER ARTHROSCOPY WITH CARPOMETACARPEL ([facility]) ARTHROPLASTY     thumb   FRACTURE SURGERY Right 09/2013   ankle and leg - plate and screws   Fusion of third joint of index finger on right hand      lipoma removed right elbow      lumbar arthrodesis      LUMBAR LAMINECTOMY/DECOMPRESSION MICRODISCECTOMY Right 11/06/2012   Procedure: LUMBAR TWO THREE LUMBAR LAMINECTOMY/DECOMPRESSION MICRODISCECTOMY 1 LEVEL;  Surgeon: Victory DELENA Gunnels, MD;  [facility];  Service: Neurosurgery;  Laterality: Right;   OPEN REDUCTION INTERNAL FIXATION (ORIF) DISTAL RADIAL FRACTURE Right 05/05/2018   Procedure: OPEN REDUCTION INTERNAL FIXATION (ORIF) DISTAL RADIAL FRACTURE;  Surgeon: Sissy Cough, MD;  Location: [facility];  Service: Orthopedics;  Laterality: Right;   ORIF ANKLE FRACTURE Right 2015   ORIF TIBIA FRACTURE Right 2015   PARTIAL HYSTERECTOMY  1980   recurrent right ankle surgery      right ankle surgery      right wrist surgery      with plates and screws   ROTATOR CUFF REPAIR Right 2017   SHOULDER ARTHROSCOPY     SPINAL CORD STIMULATOR BATTERY EXCHANGE Right 09/09/2017   Procedure: SPINAL CORD  STIMULATOR BATTERY EXCHANGE;  Surgeon: Mindi Mt, MD;  Location: [facility] OR;  Service: Neurosurgery;  Laterality: Right;  SPINAL CORD STIMULATOR BATTERY EXCHANGE   SPINAL CORD STIMULATOR IMPLANT  2010   SPINAL FUSION  2018   L1, L2   TARSAL SUSPENSION     Hx; of left thumb   TONSILLECTOMY  1955   UPPER GI ENDOSCOPY     Hx: of   WISDOM TOOTH EXTRACTION      MEDICATIONS:  albuterol  (VENTOLIN  HFA) 108 (90 Base) MCG/ACT inhaler   ALPRAZolam  (XANAX ) 1 MG tablet   ARIPiprazole  (ABILIFY ) 30 MG tablet   Ascorbic Acid  (VITAMIN C GUMMIE PO)   butalbital -acetaminophen -caffeine  (FIORICET , ESGIC ) 50-325-40 MG per tablet   calcium  carbonate (OSCAL) 1500 (600 Ca) MG TABS tablet   chlorpheniramine (CHLOR-TRIMETON) 4 MG tablet   Cholecalciferol  (VITAMIN D ) 2000 units CAPS   cyclobenzaprine  (FLEXERIL ) 10 MG tablet   cycloSPORINE  (RESTASIS ) 0.05 % ophthalmic emulsion   esomeprazole (NEXIUM) 40 MG capsule   ezetimibe  (ZETIA ) 10 MG tablet   famotidine  (PEPCID ) 20 MG tablet   fenofibrate (TRICOR) 145 MG tablet   fluticasone  (FLONASE ) 50 MCG/ACT nasal spray   folic acid (FOLVITE) 800 MCG tablet   HYDROmorphone  (DILAUDID ) 2 MG tablet   lamoTRIgine  (LAMICTAL ) 200 MG tablet   lisdexamfetamine (VYVANSE ) 50 MG capsule   lubiprostone  (AMITIZA ) 24 MCG capsule   Magnesium 250 MG TABS   meclizine  (ANTIVERT ) 25 MG tablet   Melatonin 12 MG TABS   omeprazole (PRILOSEC) 20 MG capsule   Polyethyl Glycol-Propyl Glycol (SYSTANE OP)   Potassium Gluconate 595 MG CAPS   pregabalin  (LYRICA ) 300 MG capsule   Semaglutide -Weight Management (WEGOVY ) 2.4 MG/0.75ML SOAJ   thiamine  (VITAMIN B-1) 100 MG tablet   torsemide (DEMADEX) 20 MG tablet   triamcinolone  (KENALOG) 0.025 % cream   vitamin B-12 (CYANOCOBALAMIN ) 1000 MCG tablet   No current facility-administered medications for this encounter.   Burnard CHRISTELLA Odis DEVONNA [facility] Short Stay/Anesthesiology [facility] Phone [phone] 09/12/2023 3:41 PM

## 2023-09-13 ENCOUNTER — Encounter (HOSPITAL_COMMUNITY)
Admission: RE | Disposition: A | Discharge status: Home / Self Care | Payer: Self-pay | Source: Home / Self Care | Attending: Gastroenterology

## 2023-09-13 ENCOUNTER — Ambulatory Visit (HOSPITAL_COMMUNITY): Payer: Self-pay | Admitting: Physician Assistant

## 2023-09-13 ENCOUNTER — Other Ambulatory Visit: Payer: Self-pay

## 2023-09-13 ENCOUNTER — Ambulatory Visit (HOSPITAL_COMMUNITY)
Admission: RE | Admit: 2023-09-13 | Discharge: 2023-09-13 | Disposition: A | Discharge status: Home / Self Care | Attending: Gastroenterology | Admitting: Gastroenterology

## 2023-09-13 ENCOUNTER — Ambulatory Visit (HOSPITAL_BASED_OUTPATIENT_CLINIC_OR_DEPARTMENT_OTHER): Payer: Self-pay | Admitting: Physician Assistant

## 2023-09-13 ENCOUNTER — Encounter (HOSPITAL_COMMUNITY): Payer: Self-pay | Admitting: Gastroenterology

## 2023-09-13 DIAGNOSIS — D649 Anemia, unspecified: Secondary | ICD-10-CM | POA: Insufficient documentation

## 2023-09-13 DIAGNOSIS — M797 Fibromyalgia: Secondary | ICD-10-CM | POA: Insufficient documentation

## 2023-09-13 DIAGNOSIS — F419 Anxiety disorder, unspecified: Secondary | ICD-10-CM | POA: Insufficient documentation

## 2023-09-13 DIAGNOSIS — F418 Other specified anxiety disorders: Secondary | ICD-10-CM | POA: Diagnosis not present

## 2023-09-13 DIAGNOSIS — Z87891 Personal history of nicotine dependence: Secondary | ICD-10-CM | POA: Insufficient documentation

## 2023-09-13 DIAGNOSIS — R519 Headache, unspecified: Secondary | ICD-10-CM | POA: Insufficient documentation

## 2023-09-13 DIAGNOSIS — K220 Achalasia of cardia: Secondary | ICD-10-CM | POA: Diagnosis not present

## 2023-09-13 DIAGNOSIS — M1990 Unspecified osteoarthritis, unspecified site: Secondary | ICD-10-CM | POA: Diagnosis not present

## 2023-09-13 DIAGNOSIS — K2289 Other specified disease of esophagus: Secondary | ICD-10-CM | POA: Diagnosis not present

## 2023-09-13 DIAGNOSIS — J449 Chronic obstructive pulmonary disease, unspecified: Secondary | ICD-10-CM | POA: Insufficient documentation

## 2023-09-13 DIAGNOSIS — F319 Bipolar disorder, unspecified: Secondary | ICD-10-CM | POA: Diagnosis not present

## 2023-09-13 DIAGNOSIS — I739 Peripheral vascular disease, unspecified: Secondary | ICD-10-CM

## 2023-09-13 DIAGNOSIS — K317 Polyp of stomach and duodenum: Secondary | ICD-10-CM | POA: Diagnosis not present

## 2023-09-13 DIAGNOSIS — R933 Abnormal findings on diagnostic imaging of other parts of digestive tract: Secondary | ICD-10-CM | POA: Diagnosis not present

## 2023-09-13 DIAGNOSIS — N183 Chronic kidney disease, stage 3 unspecified: Secondary | ICD-10-CM | POA: Diagnosis not present

## 2023-09-13 DIAGNOSIS — K219 Gastro-esophageal reflux disease without esophagitis: Secondary | ICD-10-CM | POA: Insufficient documentation

## 2023-09-13 DIAGNOSIS — K209 Esophagitis, unspecified without bleeding: Secondary | ICD-10-CM | POA: Diagnosis not present

## 2023-09-13 DIAGNOSIS — C160 Malignant neoplasm of cardia: Secondary | ICD-10-CM | POA: Diagnosis not present

## 2023-09-13 HISTORY — PX: ESOPHAGOGASTRODUODENOSCOPY: SHX5428

## 2023-09-13 SURGERY — EGD (ESOPHAGOGASTRODUODENOSCOPY)
Anesthesia: General

## 2023-09-13 MED ORDER — FENTANYL CITRATE (PF) 100 MCG/2ML IJ SOLN
INTRAMUSCULAR | Status: AC
Start: 2023-09-13 — End: 2023-09-13
  Filled 2023-09-13: qty 2

## 2023-09-13 MED ORDER — SUCCINYLCHOLINE CHLORIDE 200 MG/10ML IV SOSY
PREFILLED_SYRINGE | INTRAVENOUS | Status: DC | PRN
  Administered 2023-09-13: 110 mg via INTRAVENOUS

## 2023-09-13 MED ORDER — PROPOFOL 10 MG/ML IV BOLUS
INTRAVENOUS | Status: DC | PRN
  Administered 2023-09-13: 100 mg via INTRAVENOUS

## 2023-09-13 MED ORDER — FENTANYL CITRATE (PF) 100 MCG/2ML IJ SOLN
INTRAMUSCULAR | Status: DC | PRN
  Administered 2023-09-13 (×2): 50 ug via INTRAVENOUS

## 2023-09-13 MED ORDER — SODIUM CHLORIDE (PF) 0.9 % IJ SOLN
INTRAMUSCULAR | Status: AC
  Filled 2023-09-13: qty 10

## 2023-09-13 MED ORDER — ONABOTULINUMTOXINA 100 UNITS IJ SOLR
INTRAMUSCULAR | Status: AC
  Filled 2023-09-13: qty 100

## 2023-09-13 MED ORDER — ONDANSETRON HCL 4 MG/2ML IJ SOLN
INTRAMUSCULAR | Status: DC | PRN
  Administered 2023-09-13: 4 mg via INTRAVENOUS

## 2023-09-13 MED ORDER — SODIUM CHLORIDE 0.9 % IV SOLN: INTRAVENOUS | Status: DC

## 2023-09-13 NOTE — H&P (Signed)
 [name] HPI: The patient was noted to have Achalasia with an esophagram.  She is here for further evaluation.  Past Medical History:  Diagnosis Date   Anemia    Anesthesia complication    woke up during back surgery at [facility]x 2020   Anxiety    Bipolar affective disorder (HCC)    Cancer (HCC)    skin cancer   Chronic kidney disease    stage 3 kidney disease   COPD (chronic obstructive pulmonary disease) (HCC)    Cough    Depression    DJD (degenerative joint disease) of lumbar spine    DVT (deep venous thrombosis) (HCC) 2010   groin left - on coumadin for 6 months   Family history of anesthesia complication    Hx: father has nausea   Fibromyalgia    GERD (gastroesophageal reflux disease)    Left leg DVT (HCC) 2010   Lymphedema 2019   Migraine    Peripheral vascular disease (HCC)    Pneumonia    PONV (postoperative nausea and vomiting)    and migraines   Pre-diabetes    Shortness of breath    exertion   Spinal cord stimulator dysfunction (HCC)    has one in,but not working   Spinal headache    Vertigo    Hx: of   Vocal cord dysfunction    sees dr. shelah    Past Surgical History:  Procedure Laterality Date   ABDOMINAL HYSTERECTOMY  1980   partial    ANTERIOR LAT LUMBAR FUSION Left 02/15/2014   Procedure: ANTERIOR LATERAL LUMBAR INTERBODY FUSION LUMBAR TWO-THREE,LUMBAR THREE-FOUR WITH LATERAL PLATE.;  Surgeon: Victory DELENA Gunnels, MD;  [facility];  Service: Neurosurgery;  Laterality: Left;  left    APPENDECTOMY     BACK SURGERY     BLEPHAROPLASTY     bilateral   BREAST ENHANCEMENT SURGERY  1989   CARPAL TUNNEL RELEASE  2006   Right hand   CATARACT EXTRACTION W/ INTRAOCULAR LENS  IMPLANT, BILATERAL     Hx: of   CERVICAL FUSION  2005   CHOLECYSTECTOMY  1989   COLONOSCOPY W/ BIOPSIES AND POLYPECTOMY     Hx: of   cyst removed from right thumb      DENTAL RESTORATION/EXTRACTION WITH X-RAY     16 teeth removed due to osteonecrosis due to fosamax    eyebrow and eyelid lift      2024   FINGER ARTHROSCOPY WITH CARPOMETACARPEL ([facility]) ARTHROPLASTY     thumb   FRACTURE SURGERY Right 09/2013   ankle and leg - plate and screws   Fusion of third joint of index finger on right hand      lipoma removed right elbow      lumbar arthrodesis      LUMBAR LAMINECTOMY/DECOMPRESSION MICRODISCECTOMY Right 11/06/2012   Procedure: LUMBAR TWO THREE LUMBAR LAMINECTOMY/DECOMPRESSION MICRODISCECTOMY 1 LEVEL;  Surgeon: Victory DELENA Gunnels, MD;  [facility];  Service: Neurosurgery;  Laterality: Right;   OPEN REDUCTION INTERNAL FIXATION (ORIF) DISTAL RADIAL FRACTURE Right 05/05/2018   Procedure: OPEN REDUCTION INTERNAL FIXATION (ORIF) DISTAL RADIAL FRACTURE;  Surgeon: Sissy Cough, MD;  Location: [facility];  Service: Orthopedics;  Laterality: Right;   ORIF ANKLE FRACTURE Right 2015   ORIF TIBIA FRACTURE Right 2015   PARTIAL HYSTERECTOMY  1980   recurrent right ankle surgery      right ankle surgery      right wrist surgery  with plates and screws   ROTATOR CUFF REPAIR Right 2017   SHOULDER ARTHROSCOPY     SPINAL CORD STIMULATOR BATTERY EXCHANGE Right 09/09/2017   Procedure: SPINAL CORD STIMULATOR BATTERY EXCHANGE;  Surgeon: Mindi Mt, MD;  Location: [facility] OR;  Service: Neurosurgery;  Laterality: Right;  SPINAL CORD STIMULATOR BATTERY EXCHANGE   SPINAL CORD STIMULATOR IMPLANT  2010   SPINAL FUSION  2018   L1, L2   TARSAL SUSPENSION     Hx; of left thumb   TONSILLECTOMY  1955   UPPER GI ENDOSCOPY     Hx: of   WISDOM TOOTH EXTRACTION      Family History  Problem Relation Age of Onset   Lung cancer Mother    Prostate cancer Father    Heart disease Father    Rheum arthritis Paternal Grandmother    Asthma Sister     Social History:  reports that she quit smoking about 37 years ago. Her smoking use included cigarettes. She started smoking about 58 years ago. She has a 52.5 pack-year smoking history. She has never used  smokeless tobacco. She reports that she does not currently use alcohol . She reports that she does not currently use drugs.  Allergies:  Allergies  Allergen Reactions   Lisinopril Other (See Comments)    kidney failure   Lithium Other (See Comments)    Migraines and drunk   Statins Other (See Comments)    Very weak and achy   Adhesive [Tape] Rash    All tape, paper and steri-strips included   Codeine Nausea And Vomiting   Scopolamine  Other (See Comments)    Causes vertigo    Medications: Scheduled: Continuous:  sodium chloride       Results for orders placed or performed during the hospital encounter of 09/12/23 (from the past 24 hours)  Basic metabolic panel per protocol     Status: Abnormal   Collection Time: 09/12/23  1:20 PM  Result Value Ref Range   Sodium 140 135 - 145 mmol/L   Potassium 4.7 3.5 - 5.1 mmol/L   Chloride 103 98 - 111 mmol/L   CO2 27 22 - 32 mmol/L   Glucose, Bld 93 70 - 99 mg/dL   BUN 20 8 - 23 mg/dL   Creatinine, Ser 8.93 (H) 0.44 - 1.00 mg/dL   Calcium  9.1 8.9 - 10.3 mg/dL   GFR, Estimated 54 (L) >60 mL/min   Anion gap 10 5 - 15  CBC per protocol     Status: Abnormal   Collection Time: 09/12/23  1:20 PM  Result Value Ref Range   WBC 14.8 (H) 4.0 - 10.5 K/uL   RBC 4.60 3.87 - 5.11 MIL/uL   Hemoglobin 13.3 12.0 - 15.0 g/dL   HCT 58.3 63.9 - 53.9 %   MCV 90.4 80.0 - 100.0 fL   MCH 28.9 26.0 - 34.0 pg   MCHC 32.0 30.0 - 36.0 g/dL   RDW 85.4 88.4 - 84.4 %   Platelets 257 150 - 400 K/uL   nRBC 0.0 0.0 - 0.2 %  Surgical pcr screen     Status: None   Collection Time: 09/12/23  1:20 PM   Specimen: Nasal Mucosa; Nasal Swab  Result Value Ref Range   MRSA, PCR NEGATIVE NEGATIVE   Staphylococcus aureus NEGATIVE NEGATIVE     No results found.  ROS:  As stated above in the HPI otherwise negative.  Blood pressure 137/63, temperature (!) 97 F (36.1 C), temperature source Temporal, resp. rate  10, height 5' 1 (1.549 m), weight 79.4 kg, SpO2 99%.     PE: Gen: NAD, Alert and Oriented HEENT:  [place]/AT, EOMI Neck: Supple, no LAD Lungs: CTA Bilaterally CV: RRR without M/G/R ABD: Soft, NTND, +BS Ext: No C/C/E  Assessment/Plan: 1) Achalasia - Further evaluation with an EGD will be performed.  There is a high likelihood that she has an esophagus full of food.  She will need to be intubated for the procedure.  The main goal of today's procedure is to ensure that she does not have a secondary Achalasia.  [name] D 09/13/2023, 1:10 PM

## 2023-09-13 NOTE — Anesthesia Procedure Notes (Signed)
 Procedure Name: Intubation Date/Time: 09/13/2023 1:23 PM  Performed by: Turhan Chill D, CRNAPre-anesthesia Checklist: Patient identified, Emergency Drugs available, Suction available and Patient being monitored Patient Re-evaluated:Patient Re-evaluated prior to induction Oxygen Delivery Method: Circle system utilized Preoxygenation: Pre-oxygenation with 100% oxygen Induction Type: IV induction, Rapid sequence and Cricoid Pressure applied Laryngoscope Size: Mac and 3 Grade View: Grade I Tube type: Oral Tube size: 7.0 mm Number of attempts: 1 Airway Equipment and Method: Stylet and Oral airway Placement Confirmation: ETT inserted through vocal cords under direct vision, positive ETCO2 and breath sounds checked- equal and bilateral Secured at: 21 cm Tube secured with: Tape Dental Injury: Teeth and Oropharynx as per pre-operative assessment

## 2023-09-13 NOTE — Op Note (Signed)
 [facility] Patient Name: [name] Procedure Date: 09/13/2023 MRN: [id number] Attending MD: Belvie Just , MD, [id number] Date of Birth: [birth year] CSN: [id number] Age: 78 Admit Type: Outpatient Procedure:                Upper GI endoscopy Indications:              Abnormal UGI series Providers:                Belvie Just, MD, Jacquelyn Jaci Pierce, RN, Corene Southgate, Technician Referring MD:              Medicines:                General Anesthesia Complications:            No immediate complications. Estimated Blood Loss:     Estimated blood loss: none. Procedure:                Pre-Anesthesia Assessment:                           - Prior to the procedure, a History and Physical                            was performed, and patient medications and                            allergies were reviewed. The patient's tolerance of                            previous anesthesia was also reviewed. The risks                            and benefits of the procedure and the sedation                            options and risks were discussed with the patient.                            All questions were answered, and informed consent                            was obtained. Prior Anticoagulants: The patient has                            taken no anticoagulant or antiplatelet agents. ASA                            Grade Assessment: III - A patient with severe                            systemic disease. After reviewing the risks and  benefits, the patient was deemed in satisfactory                            condition to undergo the procedure.                           - Sedation was administered by an anesthesia                            professional. General anesthesia was attained.                           After obtaining informed consent, the endoscope was                            passed under direct vision.  Throughout the                            procedure, the patient's blood pressure, pulse, and                            oxygen saturations were monitored continuously. The                            [id number]-[id number] ([id number]) Olympus endoscope was introduced                            through the mouth, and advanced to the second part                            of duodenum. The upper GI endoscopy was                            accomplished without difficulty. The patient                            tolerated the procedure well. Scope In: Scope Out: Findings:      The lumen of the esophagus was moderately dilated.      LA Grade A (one or more mucosal breaks less than 5 mm, not extending       between tops of 2 mucosal folds) esophagitis with no bleeding was found       at the gastroesophageal junction. Biopsies were taken with a cold       forceps for histology.      Multiple small semi-sessile polyps with no bleeding and no stigmata of       recent bleeding were found in the gastric fundus and in the gastric body.      The examined duodenum was normal.      The initial intubation of the esophagus did not have a significant       amount of retained food content. There was one large string of a fibrous       food material and this was successfully pushed distally into the gastric       lumen. The esophagus was moderately dilated and at the GE junction it  was stenosed. There was evidence in this region and some of the gastric       cardia exhibited a roughend appearance. This maybe as a result of an       esophagitis secondary to stasis of food materials in this area. A 360       degree view of the cardia in the retroflex position was negative for any       gross evidence of secondary Achalsia. Polyps were noted in the gastric       lumen and this was consistent with fundic gland polyps. Impression:               - Dilation in the entire esophagus.                           - LA Grade A  esophagitis with no bleeding. Biopsied.                           - Multiple gastric polyps.                           - Normal examined duodenum. Moderate Sedation:      Not Applicable - Patient had care per Anesthesia. Recommendation:           - Patient has a contact number available for                            emergencies. The signs and symptoms of potential                            delayed complications were discussed with the                            patient. Return to normal activities tomorrow.                            Written discharge instructions were provided to the                            patient.                           - Full liquid diet.                           - Avoid fibrous foods such as fruits and vegetables.                           - Continue present medications.                           - Await pathology results.                           - If the biopsies are negative for malignancy, a  manometry will need to be scheduled.                           - She will ultimately benefit with POEMS in the                            absence of malignancy. Procedure Code(s):        --- Professional ---                           [phone], Esophagogastroduodenoscopy, flexible,                            transoral; with biopsy, single or multiple Diagnosis Code(s):        --- Professional ---                           K22.89, Other specified disease of esophagus                           K20.90, Esophagitis, unspecified without bleeding                           K31.7, Polyp of stomach and duodenum                           R93.3, Abnormal findings on diagnostic imaging of                            other parts of digestive tract CPT copyright 2022 American [employer] Association. All rights reserved. The codes documented in this report are preliminary and upon coder review may  be revised to meet current compliance requirements. Belvie Just, MD Belvie Just, MD 09/13/2023 1:50:39 PM This report has been signed electronically. Number of Addenda: 0

## 2023-09-13 NOTE — Discharge Instructions (Signed)

## 2023-09-13 NOTE — Transfer of Care (Signed)
 Immediate Anesthesia Transfer of Care Note  Patient: [name]  Procedure(s) Performed: EGD (ESOPHAGOGASTRODUODENOSCOPY) INJECTION, SUBMUCOSAL  Patient Location: PACU  Anesthesia Type:General  Level of Consciousness: awake, alert , and oriented  Airway & Oxygen Therapy: Patient Spontanous Breathing and Patient connected to face mask oxygen  Post-op Assessment: Report given to RN and Post -op Vital signs reviewed and stable  Post vital signs: Reviewed and stable  Last Vitals:  Vitals Value Taken Time  BP    Temp    Pulse 50 09/13/23 13:51  Resp 12 09/13/23 13:51  SpO2 100 % 09/13/23 13:51  Vitals shown include unfiled device data.  Last Pain:  Vitals:   09/13/23 1210  TempSrc: Temporal  PainSc: 0-No pain         Complications: No notable events documented.

## 2023-09-13 NOTE — Anesthesia Preprocedure Evaluation (Addendum)
 Anesthesia Evaluation  Patient identified by MRN, date of birth, ID band Patient awake    Reviewed: Allergy & Precautions, H&P , NPO status , Patient's Chart, lab work & pertinent test results  History of Anesthesia Complications (+) PONV, POST - OP SPINAL HEADACHE, Family history of anesthesia reaction and history of anesthetic complications  Airway Mallampati: II  TM Distance: >3 FB Neck ROM: Limited    Dental  (+) Edentulous Upper, Edentulous Lower, Dental Advisory Given   Pulmonary shortness of breath, COPD,  COPD inhaler, former smoker   breath sounds clear to auscultation + decreased breath sounds      Cardiovascular + Peripheral Vascular Disease   Rhythm:Regular Rate:Normal     Neuro/Psych  Headaches PSYCHIATRIC DISORDERS Anxiety Depression Bipolar Disorder      GI/Hepatic Neg liver ROS,GERD  Medicated,,  Endo/Other  negative endocrine ROS    Renal/GU Renal disease     Musculoskeletal  (+) Arthritis , Osteoarthritis,  Fibromyalgia -  Abdominal   Peds  Hematology  (+) Blood dyscrasia, anemia   Anesthesia Other Findings   Reproductive/Obstetrics                              Anesthesia Physical Anesthesia Plan  ASA: 3  Anesthesia Plan: General   Post-op Pain Management: Minimal or no pain anticipated   Induction: Rapid sequence, Cricoid pressure planned and Intravenous  PONV Risk Score and Plan: 4 or greater and Ondansetron , Dexamethasone  and Treatment may vary due to age or medical condition  Airway Management Planned: Oral ETT  Additional Equipment:   Intra-op Plan:   Post-operative Plan: Extubation in OR  Informed Consent: I have reviewed the patients History and Physical, chart, labs and discussed the procedure including the risks, benefits and alternatives for the proposed anesthesia with the patient or authorized representative who has indicated his/her understanding  and acceptance.     Dental advisory given  Plan Discussed with: CRNA  Anesthesia Plan Comments:          Anesthesia Quick Evaluation

## 2023-09-14 NOTE — Anesthesia Postprocedure Evaluation (Signed)
 Anesthesia Post Note  Patient: [name]  Procedure(s) Performed: EGD (ESOPHAGOGASTRODUODENOSCOPY)     Patient location during evaluation: Endoscopy Anesthesia Type: General Level of consciousness: patient cooperative Pain management: pain level controlled Vital Signs Assessment: post-procedure vital signs reviewed and stable Respiratory status: spontaneous breathing Cardiovascular status: stable Anesthetic complications: no   No notable events documented.  Last Vitals:  Vitals:   09/13/23 1410 09/13/23 1415  BP: (!) 126/56 (!) 126/56  Pulse: (!) 50 (!) 50  Resp: 17 (!) 58  Temp:    SpO2: 98% 98%    Last Pain:  Vitals:   09/13/23 1352  TempSrc: Temporal  PainSc: 0-No pain                 [name]

## 2023-09-15 ENCOUNTER — Encounter (HOSPITAL_COMMUNITY): Payer: Self-pay | Admitting: Gastroenterology

## 2023-09-15 ENCOUNTER — Ambulatory Visit (HOSPITAL_COMMUNITY)
Admission: RE | Admit: 2023-09-15 | Discharge: 2023-09-15 | Disposition: A | Discharge status: Home / Self Care | Source: Ambulatory Visit | Attending: Gastroenterology | Admitting: Gastroenterology

## 2023-09-15 ENCOUNTER — Other Ambulatory Visit (HOSPITAL_COMMUNITY): Payer: Self-pay | Admitting: Gastroenterology

## 2023-09-15 DIAGNOSIS — R198 Other specified symptoms and signs involving the digestive system and abdomen: Secondary | ICD-10-CM

## 2023-09-15 DIAGNOSIS — K222 Esophageal obstruction: Secondary | ICD-10-CM | POA: Diagnosis not present

## 2023-09-15 DIAGNOSIS — N200 Calculus of kidney: Secondary | ICD-10-CM | POA: Diagnosis not present

## 2023-09-15 DIAGNOSIS — R918 Other nonspecific abnormal finding of lung field: Secondary | ICD-10-CM | POA: Insufficient documentation

## 2023-09-15 DIAGNOSIS — K208 Other esophagitis without bleeding: Secondary | ICD-10-CM | POA: Diagnosis not present

## 2023-09-15 DIAGNOSIS — C160 Malignant neoplasm of cardia: Secondary | ICD-10-CM | POA: Insufficient documentation

## 2023-09-15 DIAGNOSIS — R590 Localized enlarged lymph nodes: Secondary | ICD-10-CM | POA: Diagnosis not present

## 2023-09-15 MED ORDER — IOHEXOL 300 MG/ML  SOLN
100.0000 mL | Freq: Once | INTRAMUSCULAR | Status: AC | PRN
  Administered 2023-09-15: 100 mL via INTRAVENOUS

## 2023-09-16 ENCOUNTER — Other Ambulatory Visit: Payer: Self-pay | Admitting: *Deleted

## 2023-09-16 DIAGNOSIS — C159 Malignant neoplasm of esophagus, unspecified: Secondary | ICD-10-CM

## 2023-09-16 NOTE — Progress Notes (Signed)
  PATIENT NAVIGATOR PROGRESS NOTE  Name: [name] Date: 09/16/2023 MRN: [id number]  DOB: 04/25/1945   Reason for visit:  Introductory phone call  Comments:  Called and spoke with Pt and her partner regarding referral from Dr Rollin. Have scheduled her with Dr Cloretta on Wednesday 8/13 at 12:00   Directions to building and parking discussed as well as one support person allowed in the appt with her. Verbalized understanding  Also requested MMR, MSI, Her 2 and PDL-1 testing from bx accession number [id number]  And requested PET scan for staging    Time spent counseling/coordinating care: > 60 minutes

## 2023-09-19 DIAGNOSIS — I10 Essential (primary) hypertension: Secondary | ICD-10-CM | POA: Diagnosis not present

## 2023-09-19 DIAGNOSIS — I890 Lymphedema, not elsewhere classified: Secondary | ICD-10-CM | POA: Diagnosis not present

## 2023-09-19 DIAGNOSIS — C159 Malignant neoplasm of esophagus, unspecified: Secondary | ICD-10-CM | POA: Diagnosis not present

## 2023-09-19 DIAGNOSIS — N1831 Chronic kidney disease, stage 3a: Secondary | ICD-10-CM | POA: Diagnosis not present

## 2023-09-19 DIAGNOSIS — F3130 Bipolar disorder, current episode depressed, mild or moderate severity, unspecified: Secondary | ICD-10-CM | POA: Diagnosis not present

## 2023-09-19 DIAGNOSIS — Z6833 Body mass index (BMI) 33.0-33.9, adult: Secondary | ICD-10-CM | POA: Diagnosis not present

## 2023-09-19 DIAGNOSIS — K5900 Constipation, unspecified: Secondary | ICD-10-CM | POA: Diagnosis not present

## 2023-09-20 ENCOUNTER — Ambulatory Visit (HOSPITAL_COMMUNITY)
Admission: RE | Admit: 2023-09-20 | Discharge: 2023-09-20 | Disposition: A | Discharge status: Home / Self Care | Source: Ambulatory Visit | Attending: Oncology | Admitting: Oncology

## 2023-09-20 ENCOUNTER — Other Ambulatory Visit: Payer: Self-pay | Admitting: *Deleted

## 2023-09-20 DIAGNOSIS — C159 Malignant neoplasm of esophagus, unspecified: Secondary | ICD-10-CM

## 2023-09-20 LAB — SURGICAL PATHOLOGY

## 2023-09-20 LAB — GLUCOSE, CAPILLARY: Glucose-Capillary: 101 mg/dL — ABNORMAL HIGH (ref 70–99)

## 2023-09-20 MED ORDER — FLUDEOXYGLUCOSE F - 18 (FDG) INJECTION
8.7500 | Freq: Once | INTRAVENOUS | Status: AC
  Administered 2023-09-20 (×2): 8.73 via INTRAVENOUS

## 2023-09-20 NOTE — Progress Notes (Unsigned)
 Order entered for lavender top tube for pathology

## 2023-09-21 ENCOUNTER — Inpatient Hospital Stay: Attending: Oncology | Admitting: Oncology

## 2023-09-21 ENCOUNTER — Telehealth: Payer: Self-pay | Admitting: Oncology

## 2023-09-21 ENCOUNTER — Other Ambulatory Visit: Payer: Self-pay | Admitting: *Deleted

## 2023-09-21 ENCOUNTER — Encounter: Payer: Self-pay | Admitting: *Deleted

## 2023-09-21 ENCOUNTER — Inpatient Hospital Stay

## 2023-09-21 VITALS — BP 104/64 | HR 68 | Temp 97.8°F | Resp 18 | Ht 61.0 in | Wt 179.0 lb

## 2023-09-21 DIAGNOSIS — J449 Chronic obstructive pulmonary disease, unspecified: Secondary | ICD-10-CM | POA: Insufficient documentation

## 2023-09-21 DIAGNOSIS — F1020 Alcohol dependence, uncomplicated: Secondary | ICD-10-CM | POA: Insufficient documentation

## 2023-09-21 DIAGNOSIS — Z87891 Personal history of nicotine dependence: Secondary | ICD-10-CM | POA: Diagnosis not present

## 2023-09-21 DIAGNOSIS — C159 Malignant neoplasm of esophagus, unspecified: Secondary | ICD-10-CM

## 2023-09-21 DIAGNOSIS — C160 Malignant neoplasm of cardia: Secondary | ICD-10-CM | POA: Diagnosis not present

## 2023-09-21 DIAGNOSIS — R1310 Dysphagia, unspecified: Secondary | ICD-10-CM | POA: Insufficient documentation

## 2023-09-21 NOTE — Progress Notes (Signed)
 PATIENT NAVIGATOR PROGRESS NOTE  Name: Noel Degan Date: 09/21/2023 MRN: [id number]  DOB: 1945-05-26   Reason for visit:  New patient appt  Comments:  Met with Ms Leder and friend Abundio during appt with Dr Cloretta  Referral to SW Referral to nutrition Referral to radiation oncology Referral to transportation Given Journeys notebook with disease specific information Given contact number to call with any questions     Time spent counseling/coordinating care: > 60 minutes

## 2023-09-21 NOTE — Telephone Encounter (Signed)
 Patient has been scheduled for follow-up visit per 09/21/23 LOS.  LVM notifying pt of appt details, provided my direct number to pt if appt changes need to be made.

## 2023-09-21 NOTE — Progress Notes (Signed)
 Placed referral for dietician and CSW based on SDOH screening

## 2023-09-21 NOTE — Progress Notes (Signed)
 [facility] New Patient Consult   Requesting MD: Misty Chiquita POUR, Fnp [address] [place],  [place] [address]   [name] 78 y.o.  07/23/1945    Reason for Consult: Gastroesophageal cancer   HPI: [name] reports a 6-week history of progressive solid dysphagia and substernal chest discomfort.  The dysphagia worsens when [name] has liquids after food and [name] has froth in the mouth.  [name] saw [name] primary provider and was referred to Dr. Kristie.  An esophagram 09/08/2023 revealed severe dilation of the distal esophagus with a large amount of food debris present.  Severe narrowing of the GE junction with [place] deformity consistent with achalasia. [name] was referred to Dr. Rollin and was taken to an upper endoscopy on 09/13/2023.  The lumen of the esophagus was dilated.  LA grade a esophagitis was noted at the gastroesophageal junction.  Multiple polyps were found in the gastric fundus and body.  The GE junction was stenosed.  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  Mismatch repair protein expression is intact.  HER2 negative (0).  [name] was referred for CTs on 09/15/2023.  There is circumferential wall thickening of the distal esophagus at the GE junction.  Borderline enlarged mediastinal nodes, increased from 05/04/2023.[name]  No pathologic adenopathy in the abdomen or pelvis.  [name] is tolerating a liquid and mechanical soft diet.  Past Medical History:  Diagnosis Date   Anemia    Anesthesia complication    woke up during back surgery [facility] 2020   Anxiety    Bipolar affective disorder (HCC)    Cancer (HCC)    skin cancer   Chronic kidney disease    stage 3 kidney disease   COPD (chronic obstructive pulmonary disease) (HCC)    Cough    Depression    DJD (degenerative joint disease) of lumbar spine    DVT (deep venous thrombosis) (HCC) 2010   groin left - on  coumadin for 6 months   Family history of anesthesia complication    Hx: father has nausea   Fibromyalgia    GERD (gastroesophageal reflux disease)    Left leg DVT (HCC) 2010   Lymphedema 2019   Migraine    Peripheral vascular disease (HCC)    Pneumonia    PONV (postoperative nausea and vomiting)    and migraines   Pre-diabetes    Shortness of breath    exertion   Spinal cord stimulator dysfunction (HCC)    has one in,but not working   Spinal headache    Vertigo    Hx: of   Vocal cord dysfunction    sees dr. shelah    Past Surgical History:  Procedure Laterality Date   ABDOMINAL HYSTERECTOMY  1980   partial    ANTERIOR LAT LUMBAR FUSION Left 02/15/2014   Procedure: ANTERIOR LATERAL LUMBAR INTERBODY FUSION LUMBAR TWO-THREE,LUMBAR THREE-FOUR WITH LATERAL PLATE.;  Surgeon: Victory DELENA Gunnels, MD;  Location: [facility];  Service: Neurosurgery;  Laterality: Left;  left    APPENDECTOMY     BACK SURGERY     BLEPHAROPLASTY     bilateral   BREAST ENHANCEMENT SURGERY  1989   CARPAL TUNNEL RELEASE  2006   Right hand   CATARACT EXTRACTION W/ INTRAOCULAR LENS  IMPLANT, BILATERAL     Hx: of   CERVICAL FUSION  2005   CHOLECYSTECTOMY  1989   COLONOSCOPY  W/ BIOPSIES AND POLYPECTOMY     Hx: of   cyst removed from right thumb      DENTAL RESTORATION/EXTRACTION WITH X-RAY     16 teeth removed due to osteonecrosis due to fosamax   ESOPHAGOGASTRODUODENOSCOPY N/A 09/13/2023   Procedure: EGD (ESOPHAGOGASTRODUODENOSCOPY);  Surgeon: Rollin Dover, MD;  Location: [facility];  Service: Gastroenterology;  Laterality: N/A;   eyebrow and eyelid lift      2024   FINGER ARTHROSCOPY WITH CARPOMETACARPEL ([facility]) ARTHROPLASTY     thumb   FRACTURE SURGERY Right 09/2013   ankle and leg - plate and screws   Fusion of third joint of index finger on right hand      lipoma removed right elbow      lumbar arthrodesis      LUMBAR LAMINECTOMY/DECOMPRESSION MICRODISCECTOMY Right 11/06/2012   Procedure: LUMBAR  TWO THREE LUMBAR LAMINECTOMY/DECOMPRESSION MICRODISCECTOMY 1 LEVEL;  Surgeon: Victory DELENA Gunnels, MD;  Location: [facility];  Service: Neurosurgery;  Laterality: Right;   OPEN REDUCTION INTERNAL FIXATION (ORIF) DISTAL RADIAL FRACTURE Right 05/05/2018   Procedure: OPEN REDUCTION INTERNAL FIXATION (ORIF) DISTAL RADIAL FRACTURE;  Surgeon: Sissy Cough, MD;  Location: [facility];  Service: Orthopedics;  Laterality: Right;   ORIF ANKLE FRACTURE Right 2015   ORIF TIBIA FRACTURE Right 2015   PARTIAL HYSTERECTOMY  1980   recurrent right ankle surgery      right ankle surgery      right wrist surgery      with plates and screws   ROTATOR CUFF REPAIR Right 2017   SHOULDER ARTHROSCOPY     SPINAL CORD STIMULATOR BATTERY EXCHANGE Right 09/09/2017   Procedure: SPINAL CORD STIMULATOR BATTERY EXCHANGE;  Surgeon: Mindi Mt, MD;  Location: [facility];  Service: Neurosurgery;  Laterality: Right;  SPINAL CORD STIMULATOR BATTERY EXCHANGE   SPINAL CORD STIMULATOR IMPLANT  2010   SPINAL FUSION  2018   L1, L2   TARSAL SUSPENSION     Hx; of left thumb   TONSILLECTOMY  1955   UPPER GI ENDOSCOPY     Hx: of   WISDOM TOOTH EXTRACTION      Medications: Reviewed  Allergies:  Allergies  Allergen Reactions   Lisinopril Other (See Comments)    kidney failure   Lithium Other (See Comments)    Migraines and drunk   Statins Other (See Comments)    Very weak and achy   Adhesive [Tape] Rash    All tape, paper and steri-strips included   Codeine Nausea And Vomiting   Scopolamine  Other (See Comments)    Causes vertigo    Family history: [name] father had prostate cancer and died of melanoma.  [name] mother had lung cancer and was a smoker  Social History:   [name] lives with [name] wife in [place].  [name] quit smoking cigarettes in 1988 after smoking for 21 years.  [name] is an alcoholic.  [name] quit alcohol  in 1989.  [name] has 28 animals at home.  No transfusion history.  No risk factor for HIV or hepatitis.   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  Physical Exam:  Blood pressure 104/64, pulse 68, temperature 97.8 F (36.6 C), temperature source Temporal, resp. rate 18, height 5' 1 (1.549 m), weight 179 lb (  81.2 kg), SpO2 98%.  HEENT: Oropharynx without visible mass, upper and lower denture plate, neck without mass Lungs: Clear bilaterally, no respiratory distress Cardiac: Regular rate and rhythm Abdomen: No hepatosplenomegaly, no mass, no apparent ascites  Vascular: No leg edema Lymph nodes: No cervical, supraclavicular, axillary, or inguinal nodes Neurologic: Alert and oriented, the motor exam appears intact in the upper and lower extremities bilaterally Skin: Multiple tattoos Musculoskeletal: Mild tenderness throughout the spine   LAB:  CBC  Lab Results  Component Value Date   WBC 14.8 (H) 09/12/2023   HGB 13.3 09/12/2023   HCT 41.6 09/12/2023   MCV 90.4 09/12/2023   PLT 257 09/12/2023   NEUTROABS 6.2 09/22/2021        CMP  Lab Results  Component Value Date   NA 140 09/12/2023   K 4.7 09/12/2023   CL 103 09/12/2023   CO2 27 09/12/2023   GLUCOSE 93 09/12/2023   BUN 20 09/12/2023   CREATININE 1.06 (H) 09/12/2023   CALCIUM  9.1 09/12/2023   PROT 6.2 02/06/2014   ALBUMIN  3.7 02/06/2014   AST 21 02/06/2014   ALT 17 02/06/2014   ALKPHOS 121 (H) 02/06/2014   BILITOT 0.3 02/06/2014   GFRNONAA 54 (L) 09/12/2023   GFRAA >60 09/06/2017     No results found for: CEA1, RJW800, CA125  Imaging:  NM PET Image Initial (PI) Skull Base To Thigh (F-18 FDG) Result Date: 09/20/2023 CLINICAL DATA:  Subsequent treatment strategy for esophageal cancer EXAM: NUCLEAR MEDICINE PET SKULL BASE TO THIGH TECHNIQUE: 8.73 mCi F-18 FDG was  injected intravenously. Full-ring PET imaging was performed from the skull base to thigh after the radiotracer. CT data was obtained and used for attenuation correction and anatomic localization. Fasting blood glucose: 101 mg/dl COMPARISON:  CT chest abdomen pelvis September 15, 2023 FINDINGS: Mediastinal blood pool activity: SUV max 2.7 Liver activity: SUV max 3.9 Incidental CT findings: None. CHEST: Right middle lobe perifissural nodule measuring 4 mm, non FDG avid and below PET resolution stable to prior CT. No new nodules. A right subcarinal lymph node measuring 1 cm with max SUV 3.2 (image 70) concerning for metastasis. Right lower right lower paratracheal lymph node measuring 8 mm max SUV 2.2, indeterminate. Otherwise no suspicious lymphadenopathy. Of coronary arteries. Atherosclerotic calcifications ABDOMEN/PELVIS: Hypermetabolic mass involving the gastric cardia measuring 1.8 x 2.6 x 2.7 cm with max SUV 9.1 consistent with known malignancy. Esophagus is distended and patulous proximal to the mass. Incidental CT findings: Left hepatic lobe cyst without FDG uptake. Cholecystectomy. Right kidney lower pole nonobstructive nephrolithiasis without hydronephrosis. Bilateral fat containing inguinal hernias. Hysterectomy.  No adnexal mass. SKELETON: No suspicious lesion. Posterior spinal fusion device. Soft tissue focal metabolic activity adjacent to ischial measuring 1 cm max SUV 4.4, indeterminate likely enthesopathy. Bilateral uptake around the shoulder joints and left sternoclavicular likely degenerative/inflammatory. Incidental CT findings: Spinal cord stimulator in place. IMPRESSION: Partially obstructive hypermetabolic gastroesophageal junction mass consistent with known adenocarcinoma. Non FDG avid right middle lobe perifissural nodule, below PET resolution and indeterminate, stable to prior. Recommend follow-up according to oncology protocols. Right subcarinal and lower paratracheal lymph nodes, likely  representing locoregional nodal metastasis. No other lymphadenopathy to suggest nodal metastasis. A focal soft tissue uptake adjacent to right ischium without CT correlate, likely representing enthesopathy. Soft tissue metastasis from esophageal malignancy is common and cannot be excluded. Recommend continued attention of follow-up exams. Otherwise no suspicious hypermetabolic finding to indicate distant metastasis. Electronically Signed   By: Megan  Zare M.D.  On: 09/20/2023 19:52      Assessment/Plan:   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 upper endoscopy stenosis at the GE junction with abnormal appearance of the GE junction and gastric cardia-biopsy: Moderately differentiated adenocarcinoma, mismatch repair protein expression intact, HER2 negative (0) 09/08/2023 esophagram dilation of the distal esophagus with 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  Non-hypermetabolic right middle lobe perifissural nodule below PET resolution and stable from prior, right subcarinal and low paratracheal lymph nodes suspicious for metastases, focal soft tissue uptake adjacent to the right ischium without CT correlate COPD Chronic back pain Bipolar disorder Migraine headaches Left leg DVT in 2010 Fibromyalgia Arthritis of the knees Lymphedema of the legs 10.  History of tobacco and alcohol  use, none at present 11.   Alcoholism 12.  Solid dysphagia and anterior chest pain secondary to #1    [name] has been diagnosed with gastroesophageal cancer after presenting with solid dysphagia and chest pain.  [name] appears to have disease localized to the GE junction and potentially mediastinal lymph nodes.  I reviewed the CT and pet images with [name].   We discussed treatment options. [name] does not appear to be a surgical candidate based on [name] age, COPD, and multiple comorbid conditions.  We discussed the likely treatment recommendation for concurrent chemotherapy and radiation.  [name] case will be presented at the GI tumor conference 09/28/2023 and [name] will return for an office visit and further discussion on 09/30/2023.  We made a referral to radiation oncology.  PD-L1 and Cldn18.2 testing are pending on the gastroesophageal biopsy.  [name] will continue a liquid/mechanical soft diet.  [name] will contact us  if [name] is unable to tolerate liquids.    Arley Hof, MD  09/21/2023, 2:25 PM

## 2023-09-22 NOTE — Progress Notes (Incomplete)
 GI Location of Tumor / Histology: Gastroesophageal Cancer  Annye Favors presented with complaints of 6 week history of progressive solid dysphagia and substernal chest discomfort.  Esophagram was performed 09/08/2023 and revealed severe dilation of the distal esophagus with a large amount of food debris present.  Upper Endoscopy 09/13/2023:   Biopsies of GE Junction Mass 09/13/2023    Past/Anticipated interventions by surgeon, if any:    Past/Anticipated interventions by medical oncology, if any:  Dr. Cloretta 09/21/2023 -She appears to have disease localized to the GE junction and potentially mediastinal lymph nodes.  -She does not appear to be a surgical candidate based on her age, COPD, and multiple comorbid conditions.  -We discussed the likely treatment recommendation for concurrent chemotherapy and radiation.    Weight changes, if any:   Bowel/Bladder complaints, if any:   Nausea / Vomiting, if any:   Pain issues, if any:    Diet:   SAFETY ISSUES: Prior radiation?  Pacemaker/ICD?  Possible current pregnancy? Postmenopausal Is the patient on methotrexate?   Current Complaints/Details:

## 2023-09-22 NOTE — Telephone Encounter (Signed)
 [name]

## 2023-09-23 ENCOUNTER — Inpatient Hospital Stay: Admitting: Dietician

## 2023-09-23 ENCOUNTER — Ambulatory Visit
Admission: RE | Admit: 2023-09-23 | Discharge: 2023-09-23 | Disposition: A | Discharge status: Home / Self Care | Source: Ambulatory Visit | Attending: Radiation Oncology | Admitting: Radiation Oncology

## 2023-09-23 ENCOUNTER — Telehealth: Payer: Self-pay | Admitting: Dietician

## 2023-09-23 ENCOUNTER — Encounter: Payer: Self-pay | Admitting: Radiation Oncology

## 2023-09-23 VITALS — BP 144/66 | HR 51 | Temp 97.9°F | Resp 20 | Ht 61.0 in | Wt 182.2 lb

## 2023-09-23 DIAGNOSIS — N200 Calculus of kidney: Secondary | ICD-10-CM | POA: Insufficient documentation

## 2023-09-23 DIAGNOSIS — Z79899 Other long term (current) drug therapy: Secondary | ICD-10-CM | POA: Diagnosis not present

## 2023-09-23 DIAGNOSIS — Z87891 Personal history of nicotine dependence: Secondary | ICD-10-CM | POA: Insufficient documentation

## 2023-09-23 DIAGNOSIS — J449 Chronic obstructive pulmonary disease, unspecified: Secondary | ICD-10-CM | POA: Insufficient documentation

## 2023-09-23 DIAGNOSIS — Z923 Personal history of irradiation: Secondary | ICD-10-CM | POA: Insufficient documentation

## 2023-09-23 DIAGNOSIS — R911 Solitary pulmonary nodule: Secondary | ICD-10-CM | POA: Diagnosis not present

## 2023-09-23 DIAGNOSIS — K4020 Bilateral inguinal hernia, without obstruction or gangrene, not specified as recurrent: Secondary | ICD-10-CM | POA: Diagnosis not present

## 2023-09-23 DIAGNOSIS — R590 Localized enlarged lymph nodes: Secondary | ICD-10-CM | POA: Insufficient documentation

## 2023-09-23 DIAGNOSIS — C160 Malignant neoplasm of cardia: Secondary | ICD-10-CM | POA: Diagnosis not present

## 2023-09-23 DIAGNOSIS — M549 Dorsalgia, unspecified: Secondary | ICD-10-CM | POA: Diagnosis not present

## 2023-09-23 DIAGNOSIS — Z86718 Personal history of other venous thrombosis and embolism: Secondary | ICD-10-CM | POA: Diagnosis not present

## 2023-09-23 DIAGNOSIS — Z9221 Personal history of antineoplastic chemotherapy: Secondary | ICD-10-CM | POA: Diagnosis not present

## 2023-09-23 DIAGNOSIS — M797 Fibromyalgia: Secondary | ICD-10-CM | POA: Insufficient documentation

## 2023-09-23 DIAGNOSIS — K219 Gastro-esophageal reflux disease without esophagitis: Secondary | ICD-10-CM | POA: Diagnosis not present

## 2023-09-23 DIAGNOSIS — Z9049 Acquired absence of other specified parts of digestive tract: Secondary | ICD-10-CM | POA: Insufficient documentation

## 2023-09-23 DIAGNOSIS — Z801 Family history of malignant neoplasm of trachea, bronchus and lung: Secondary | ICD-10-CM | POA: Insufficient documentation

## 2023-09-23 DIAGNOSIS — N183 Chronic kidney disease, stage 3 unspecified: Secondary | ICD-10-CM | POA: Diagnosis not present

## 2023-09-23 DIAGNOSIS — Z85828 Personal history of other malignant neoplasm of skin: Secondary | ICD-10-CM | POA: Insufficient documentation

## 2023-09-23 DIAGNOSIS — I739 Peripheral vascular disease, unspecified: Secondary | ICD-10-CM | POA: Insufficient documentation

## 2023-09-23 DIAGNOSIS — D649 Anemia, unspecified: Secondary | ICD-10-CM | POA: Insufficient documentation

## 2023-09-23 DIAGNOSIS — K7689 Other specified diseases of liver: Secondary | ICD-10-CM | POA: Insufficient documentation

## 2023-09-23 DIAGNOSIS — K222 Esophageal obstruction: Secondary | ICD-10-CM | POA: Insufficient documentation

## 2023-09-23 DIAGNOSIS — C159 Malignant neoplasm of esophagus, unspecified: Secondary | ICD-10-CM | POA: Diagnosis not present

## 2023-09-23 NOTE — Progress Notes (Signed)
 GI Location of Tumor / Histology: Gastroesophageal Cancer  [name] presented with complaints of 6 week history of progressive solid dysphagia and substernal chest discomfort.  Esophagram was performed 09/08/2023 and revealed severe dilation of the distal esophagus with a large amount of food debris present.  Upper Endoscopy 09/13/2023:   Biopsies of GE Junction Mass 09/13/2023    Past/Anticipated interventions by surgeon, if any:    Past/Anticipated interventions by medical oncology, if any:  Dr. Cloretta 09/21/2023 -She appears to have disease localized to the GE junction and potentially mediastinal lymph nodes.  -She does not appear to be a surgical candidate based on her age, COPD, and multiple comorbid conditions.  -We discussed the likely treatment recommendation for concurrent chemotherapy and radiation.    Weight changes, if any:  Wt Readings from Last 3 Encounters:  09/23/23 182 lb 3.2 oz (82.6 kg)  09/21/23 179 lb (81.2 kg)  09/13/23 175 lb (79.4 kg)     Bowel/Bladder complaints, if any: Yes, constipation.   Nausea / Vomiting, if any: No, reports food continues to get stuck in throat.   Pain issues, if any:  Patient reports generalized pain.   Diet:   SAFETY ISSUES: Prior radiation? No Pacemaker/ICD? Patient has spinal stimulator  Possible current pregnancy? . Postmenopausal Is the patient on methotrexate? No   Current Complaints/Details:  BP (!) 144/66 (BP Location: Left Arm, Patient Position: Sitting, Cuff Size: Large)   Pulse (!) 51   Temp 97.9 F (36.6 C)   Resp 20   Ht 5' 1 (1.549 m)   Wt 182 lb 3.2 oz (82.6 kg)   SpO2 100%   BMI 34.43 kg/m

## 2023-09-23 NOTE — Telephone Encounter (Signed)
 Nutrition Assessment   Reason for Assessment: Urgent referral    ASSESSMENT: 78 year old female with newly diagnosed GE junction. Likely to receive chemoradiation under the care of Dr. Dewey and Dr. Cloretta. Treatment plan under work-up.   Past medical history includes right pulmonary nodule, COPD, ILD, fibromyalgia, idiopathic scoliosis/kyphoscoliosis, degenerative spondylolisthesis, bipolar affective disorder, IDA, cellulitis, DVT, wheelchair bound  Spoke with patient via telephone. She reports doing well today. She is tolerating some soft foods and liquids. Recalls cream of celery soup with milk prior to appointments today. Currently tolerating yogurt with crushed nuts, scrambled eggs, pudding, chicken/rice soup, creamy tomato, french onion soup, creamed potato soup (tolerates crackers in soup if well moistened), jello and smoothies (whey protein powder, pineapple, blueberries, banana, milk). Drinking 2 Boost HP (160 lb, 30 g).   Patient is concerned about gaining weight. She reports working with Dr. Antoinette for weight loss, endorsing 93 lb intentional wt loss with GLP-1 + restricted energy intake (1100 kcal, 85 g) per pt. Patient has stopped Wegovy  injections as recommended s/p diagnosis. Patient has kept a food journal for the last 2 years. Reports 600-700 kcal/day x 1-2 weeks.   Patient wondering how she is going to take all her pills. Sometimes these get stuck and dissolve in her esophagus. Patient is planning to call her pharmacy after this.   Nutrition Focused Physical Exam: unable to complete (telephone visit)  Medications: xanax , abilify , oscal, chlor-trimeton, flexeril , nexium, zetia , pepcid , tricor, folvite, dilaudid , lamictal , vyvanse , amitiza , antivert , prilosec, demadex, B12, B1, potassium gluconate, lyrica     Labs: 8/4 labs reviewed    Anthropometrics: Reports intentional 93 lbs wt loss with GLP-1 + restricted energy intake under MD supervision (25% wt loss in 2 years per  chart)  Height: 5'1 Weight: 182 lb 3.2 oz  UBW: 248 lb 11.2 oz (09/22/21) BMI: 34.43   NUTRITION DIAGNOSIS: Inadequate oral intake related to cancer as evidenced by dysphagia with solids, FLD order, 600-700 kcal/day per pt report    INTERVENTION:  Educated on importance of adequate calorie/protein energy intake to maintain strength/weights during treatment Encourage high calorie high protein liquids and soft smooth textures  Continue drinking Boost, recommend switching to Boost Plus for added calories FLD ideas, foods easy to chew/swallow, shake recipes, coupons, contact information mailed to pt confirmed address  Pt to contact pharmacy regarding crushable medications   MONITORING, EVALUATION, GOAL: Pt will tolerate adequate calories and protein to minimize wt loss during treatment    Next Visit: To be scheduled

## 2023-09-23 NOTE — Progress Notes (Signed)
 Radiation Oncology         ([phone]) [phone] ________________________________  Name: [name]        MRN: [id number]  Date of Service: 09/23/2023 DOB: December 25, 1945  RR:Dujfzb, Chiquita POUR, FNP  [name], MD     REFERRING PHYSICIAN: [name], MD   DIAGNOSIS: The encounter diagnosis was Adenocarcinoma of gastroesophageal junction (HCC).   Gastroesophageal cancer   HISTORY OF PRESENT ILLNESS: [name] is a 78 y.o. female with a medical history significant for COPD, chronic back pain, bipolar disorder, and history of tobacco use seen at the request of Dr. Cloretta for a newly diagnosed esophageal cancer.  She presented with 6-week history of progressive solid and liquid dysphagia and substernal chest discomfort.  She also noticed a buildup of saliva in her mouth after drinking liquids.  Esophagram on 09/08/2023 demonstrated severe dilation of the distal esophagus with a large amount of food debris present.  Severe narrowing of the GE junction with BirdBeak deformity was present, consistent with achalasia.  She was referred to Dr. Rollin who proceeded with a upper endoscopy on 09/13/2023.  LA grade A esophagitis was noted at the gastroesophageal junction; multiple polyps were found in the gastric fundus and body; and the GE junction was noted to be stenosed. Biopsy at the GE junction revealed moderately differentiated adenocarcinoma.  CT of the chest abdomen and pelvis on 09/15/2023 demonstrated circumferential mural thickening of the distal thoracic esophagus at the GE junction; borderline enlarged mediastinal lymph nodes; and no evidence of intra-abdominal or intrapelvic metastases.  Subsequent PET on 09/20/2023 demonstrated the previously visualized partially obstructive gastroesophageal junction mass to be hypermetabolic; a non-FDG avid right middle lobe perifissural nodule, stable to prior; right subcarinal and lower paratracheal lymph nodes, likely representing local regional nodal  metastases; and a focal soft tissue uptake adjacent to the right ischium, likely representing an enthesopathy; however, soft tissue metastasis cannot be excluded.  Patient met with Dr. Cloretta on 09/21/2023.  Per his recommendations, the patient was educated on the likely treatment recommendation for concurrent chemo and radiation.  Her case will be presented at the GI tumor conference on 09/28/2023 and she will return for office visit and for further discussion on 09/30/2023 with Dr. Cloretta.  She was kindly referred to us  today to discuss radiation treatment options.   PREVIOUS RADIATION THERAPY: No   PAST MEDICAL HISTORY:  Past Medical History:  Diagnosis Date   Anemia    Anesthesia complication    woke up during back surgery at [facility] 2020   Anxiety    Bipolar affective disorder (HCC)    Cancer (HCC)    skin cancer   Chronic kidney disease    stage 3 kidney disease   COPD (chronic obstructive pulmonary disease) (HCC)    Cough    Depression    DJD (degenerative joint disease) of lumbar spine    DVT (deep venous thrombosis) (HCC) 2010   groin left - on coumadin for 6 months   Family history of anesthesia complication    Hx: father has nausea   Fibromyalgia    GERD (gastroesophageal reflux disease)    Left leg DVT (HCC) 2010   Lymphedema 2019   Migraine    Peripheral vascular disease (HCC)    Pneumonia    PONV (postoperative nausea and vomiting)    and migraines   Pre-diabetes    Shortness of breath    exertion   Spinal cord stimulator dysfunction (HCC)    has  one in,but not working   Spinal headache    Vertigo    Hx: of   Vocal cord dysfunction    sees dr. shelah       PAST SURGICAL HISTORY: Past Surgical History:  Procedure Laterality Date   ABDOMINAL HYSTERECTOMY  1980   partial    ANTERIOR LAT LUMBAR FUSION Left 02/15/2014   Procedure: ANTERIOR LATERAL LUMBAR INTERBODY FUSION LUMBAR TWO-THREE,LUMBAR THREE-FOUR WITH LATERAL PLATE.;  Surgeon: Victory DELENA Gunnels, MD;  Location: [facility];  Service: Neurosurgery;  Laterality: Left;  left    APPENDECTOMY     BACK SURGERY     BLEPHAROPLASTY     bilateral   BREAST ENHANCEMENT SURGERY  1989   CARPAL TUNNEL RELEASE  2006   Right hand   CATARACT EXTRACTION W/ INTRAOCULAR LENS  IMPLANT, BILATERAL     Hx: of   CERVICAL FUSION  2005   CHOLECYSTECTOMY  1989   COLONOSCOPY W/ BIOPSIES AND POLYPECTOMY     Hx: of   cyst removed from right thumb      DENTAL RESTORATION/EXTRACTION WITH X-RAY     16 teeth removed due to osteonecrosis due to fosamax   ESOPHAGOGASTRODUODENOSCOPY N/A 09/13/2023   Procedure: EGD (ESOPHAGOGASTRODUODENOSCOPY);  Surgeon: Rollin Dover, MD;  Location: [facility];  Service: Gastroenterology;  Laterality: N/A;   eyebrow and eyelid lift      2024   FINGER ARTHROSCOPY WITH CARPOMETACARPEL ([facility]) ARTHROPLASTY     thumb   FRACTURE SURGERY Right 09/2013   ankle and leg - plate and screws   Fusion of third joint of index finger on right hand      lipoma removed right elbow      lumbar arthrodesis      LUMBAR LAMINECTOMY/DECOMPRESSION MICRODISCECTOMY Right 11/06/2012   Procedure: LUMBAR TWO THREE LUMBAR LAMINECTOMY/DECOMPRESSION MICRODISCECTOMY 1 LEVEL;  Surgeon: Victory DELENA Gunnels, MD;  Location: [facility];  Service: Neurosurgery;  Laterality: Right;   OPEN REDUCTION INTERNAL FIXATION (ORIF) DISTAL RADIAL FRACTURE Right 05/05/2018   Procedure: OPEN REDUCTION INTERNAL FIXATION (ORIF) DISTAL RADIAL FRACTURE;  Surgeon: Sissy Cough, MD;  Location: [facility];  Service: Orthopedics;  Laterality: Right;   ORIF ANKLE FRACTURE Right 2015   ORIF TIBIA FRACTURE Right 2015   PARTIAL HYSTERECTOMY  1980   recurrent right ankle surgery      right ankle surgery      right wrist surgery      with plates and screws   ROTATOR CUFF REPAIR Right 2017   SHOULDER ARTHROSCOPY     SPINAL CORD STIMULATOR BATTERY EXCHANGE Right 09/09/2017   Procedure: SPINAL CORD STIMULATOR BATTERY  EXCHANGE;  Surgeon: Mindi Mt, MD;  Location: [facility];  Service: Neurosurgery;  Laterality: Right;  SPINAL CORD STIMULATOR BATTERY EXCHANGE   SPINAL CORD STIMULATOR IMPLANT  2010   SPINAL FUSION  2018   L1, L2   TARSAL SUSPENSION     Hx; of left thumb   TONSILLECTOMY  1955   UPPER GI ENDOSCOPY     Hx: of   WISDOM TOOTH EXTRACTION       FAMILY HISTORY:  Family History  Problem Relation Age of Onset   Cancer Mother        lung   Lung cancer Mother    Cancer Father    Prostate cancer Father    Heart disease Father    Asthma Sister    Rheum arthritis Paternal Grandmother      SOCIAL HISTORY:  reports  that she quit smoking about 37 years ago. Her smoking use included cigarettes. She started smoking about 58 years ago. She has a 52.5 pack-year smoking history. She has never used smokeless tobacco. She reports that she does not currently use alcohol . She reports that she does not currently use drugs.  She is a retired Retail buyer.  She currently lives in [place] with her wife, 5 dogs, and 23 cats.   ALLERGIES: Lisinopril, Lithium, Statins, Adhesive [tape], Codeine, and Scopolamine    MEDICATIONS:  Current Outpatient Medications  Medication Sig Dispense Refill   albuterol  (VENTOLIN  HFA) 108 (90 Base) MCG/ACT inhaler INHALE 2 PUFFS EVERY 6 HOURS AS NEEDED FOR WHEEZING OR SHORTNESS OF BREATH. 18 g 6   ALPRAZolam  (XANAX ) 1 MG tablet Take 1 mg by mouth See admin instructions. Take 1 tablet (1 mg) by mouth scheduled twice daily, may take an additional dose in the afternoon, if needed for anxiety.     ARIPiprazole  (ABILIFY ) 30 MG tablet Take 30 mg by mouth at bedtime.  5   Ascorbic Acid  (VITAMIN C GUMMIE PO) Take 1 tablet by mouth daily after breakfast.      butalbital -acetaminophen -caffeine  (FIORICET , ESGIC ) 50-325-40 MG per tablet Take 2 tablets by mouth every 4 (four) hours as needed for headache or migraine.      calcium  carbonate (OSCAL) 1500 (600 Ca) MG TABS tablet Take 600 mg  of elemental calcium  by mouth daily with breakfast.     chlorpheniramine (CHLOR-TRIMETON) 4 MG tablet Take 8 mg by mouth at bedtime.     Cholecalciferol  (VITAMIN D ) 2000 units CAPS Take 2,000 Units by mouth daily after breakfast.     cyclobenzaprine  (FLEXERIL ) 10 MG tablet Take 10 mg by mouth at bedtime.     cycloSPORINE  (RESTASIS ) 0.05 % ophthalmic emulsion Place 1 drop into both eyes 2 (two) times daily.     esomeprazole (NEXIUM) 40 MG capsule Take 40 mg by mouth daily before breakfast.       ezetimibe  (ZETIA ) 10 MG tablet Take 10 mg by mouth at bedtime.     famotidine  (PEPCID ) 20 MG tablet Take 20 mg by mouth daily as needed (mid day if needed for indigestion.). (Patient not taking: Reported on 09/21/2023)     fenofibrate (TRICOR) 145 MG tablet Take 145 mg by mouth in the morning.     fluticasone  (FLONASE ) 50 MCG/ACT nasal spray Place 2 sprays into both nostrils 2 (two) times daily. 16 g 11   folic acid (FOLVITE) 800 MCG tablet Take 800 mcg by mouth daily.     HYDROmorphone  (DILAUDID ) 2 MG tablet Take 2 mg by mouth in the morning, at noon, and at bedtime.     lamoTRIgine  (LAMICTAL ) 200 MG tablet Take 200 mg by mouth at bedtime.     lisdexamfetamine (VYVANSE ) 50 MG capsule Take 50 mg by mouth daily after breakfast.     lubiprostone  (AMITIZA ) 24 MCG capsule Take 24 mcg by mouth every 3 (three) days.     Magnesium 250 MG TABS Take 250 mg by mouth daily.     meclizine  (ANTIVERT ) 25 MG tablet Take 25 mg by mouth 3 (three) times daily as needed for dizziness.      Melatonin 12 MG TABS Take 12 mg by mouth at bedtime.     omeprazole (PRILOSEC) 20 MG capsule Take 20 mg by mouth at bedtime.       Polyethyl Glycol-Propyl Glycol (SYSTANE OP) Place 1 drop into both eyes 3 (three) times daily as needed (dry/irritated  eyes.).     Potassium Gluconate 595 MG CAPS Take 1 capsule by mouth 2 (two) times daily.     pregabalin  (LYRICA ) 300 MG capsule Take 300 mg by mouth in the morning and at bedtime.      Semaglutide -Weight Management (WEGOVY ) 2.4 MG/0.75ML SOAJ Inject 2.4 mg into the skin once a week. (Patient not taking: Reported on 09/21/2023) 9 mL 0   thiamine  (VITAMIN B-1) 100 MG tablet Take 100 mg by mouth daily.     torsemide (DEMADEX) 20 MG tablet Take 20 mg by mouth 4 (four) times daily as needed (fluid retention.).     triamcinolone  (KENALOG) 0.025 % cream Apply 1 application topically 2 (two) times daily as needed (itching). (Patient not taking: Reported on 09/21/2023)     vitamin B-12 (CYANOCOBALAMIN ) 1000 MCG tablet Take 1,000 mcg by mouth daily after breakfast.     No current facility-administered medications for this encounter.     REVIEW OF SYSTEMS: Patient notes a 6-week history of dysphagia with solids and liquids and substernal chest pain.  She states the pain is exacerbated with eating.  She particularly struggles to swallow meat.  She has been only eating soft foods.  She takes an antiacid twice a day.  She has chronic knee, back, and shoulder pain for which she takes Dilaudid  2 mg twice daily.     PHYSICAL EXAM:  Wt Readings from Last 3 Encounters:  09/23/23 182 lb 3.2 oz (82.6 kg)  09/21/23 179 lb (81.2 kg)  09/13/23 175 lb (79.4 kg)   Temp Readings from Last 3 Encounters:  09/23/23 97.9 F (36.6 C)  09/21/23 97.8 F (36.6 C) (Temporal)  09/13/23 (!) 97.2 F (36.2 C) (Temporal)   BP Readings from Last 3 Encounters:  09/23/23 (!) 144/66  09/21/23 104/64  09/13/23 (!) 126/56   Pulse Readings from Last 3 Encounters:  09/23/23 (!) 51  09/21/23 68  09/13/23 (!) 50   Pain Assessment Pain Score: 6  Pain Loc: Generalized/10  In general this is a well appearing female in no acute distress.  She's alert and oriented x4 and appropriate throughout the examination. Cardiopulmonary assessment is negative for acute distress and she exhibits normal effort.     ECOG = 2  0 - Asymptomatic (Fully active, able to carry on all predisease activities without  restriction)  1 - Symptomatic but completely ambulatory (Restricted in physically strenuous activity but ambulatory and able to carry out work of a light or sedentary nature. For example, light housework, office work)  2 - Symptomatic, <50% in bed during the day (Ambulatory and capable of all self care but unable to carry out any work activities. Up and about more than 50% of waking hours)  3 - Symptomatic, >50% in bed, but not bedbound (Capable of only limited self-care, confined to bed or chair 50% or more of waking hours)  4 - Bedbound (Completely disabled. Cannot carry on any self-care. Totally confined to bed or chair)  5 - Death   Raylene MM, Creech RH, Tormey DC, et al. [phone]). Toxicity and response criteria of the [facility]. Am. DOROTHA Bridges. Oncol. 5 (6): 649-55    LABORATORY DATA:  Lab Results  Component Value Date   WBC 14.8 (H) 09/12/2023   HGB 13.3 09/12/2023   HCT 41.6 09/12/2023   MCV 90.4 09/12/2023   PLT 257 09/12/2023   Lab Results  Component Value Date   NA 140 09/12/2023   K 4.7 09/12/2023  CL 103 09/12/2023   CO2 27 09/12/2023   Lab Results  Component Value Date   ALT 17 02/06/2014   AST 21 02/06/2014   ALKPHOS 121 (H) 02/06/2014   BILITOT 0.3 02/06/2014      RADIOGRAPHY: NM PET Image Initial (PI) Skull Base To Thigh (F-18 FDG) Result Date: 09/20/2023 CLINICAL DATA:  Subsequent treatment strategy for esophageal cancer EXAM: NUCLEAR MEDICINE PET SKULL BASE TO THIGH TECHNIQUE: 8.73 mCi F-18 FDG was injected intravenously. Full-ring PET imaging was performed from the skull base to thigh after the radiotracer. CT data was obtained and used for attenuation correction and anatomic localization. Fasting blood glucose: 101 mg/dl COMPARISON:  CT chest abdomen pelvis September 15, 2023 FINDINGS: Mediastinal blood pool activity: SUV max 2.7 Liver activity: SUV max 3.9 Incidental CT findings: None. CHEST: Right middle lobe perifissural nodule  measuring 4 mm, non FDG avid and below PET resolution stable to prior CT. No new nodules. A right subcarinal lymph node measuring 1 cm with max SUV 3.2 (image 70) concerning for metastasis. Right lower right lower paratracheal lymph node measuring 8 mm max SUV 2.2, indeterminate. Otherwise no suspicious lymphadenopathy. Of coronary arteries. Atherosclerotic calcifications ABDOMEN/PELVIS: Hypermetabolic mass involving the gastric cardia measuring 1.8 x 2.6 x 2.7 cm with max SUV 9.1 consistent with known malignancy. Esophagus is distended and patulous proximal to the mass. Incidental CT findings: Left hepatic lobe cyst without FDG uptake. Cholecystectomy. Right kidney lower pole nonobstructive nephrolithiasis without hydronephrosis. Bilateral fat containing inguinal hernias. Hysterectomy.  No adnexal mass. SKELETON: No suspicious lesion. Posterior spinal fusion device. Soft tissue focal metabolic activity adjacent to ischial measuring 1 cm max SUV 4.4, indeterminate likely enthesopathy. Bilateral uptake around the shoulder joints and left sternoclavicular likely degenerative/inflammatory. Incidental CT findings: Spinal cord stimulator in place. IMPRESSION: Partially obstructive hypermetabolic gastroesophageal junction mass consistent with known adenocarcinoma. Non FDG avid right middle lobe perifissural nodule, below PET resolution and indeterminate, stable to prior. Recommend follow-up according to oncology protocols. Right subcarinal and lower paratracheal lymph nodes, likely representing locoregional nodal metastasis. No other lymphadenopathy to suggest nodal metastasis. A focal soft tissue uptake adjacent to right ischium without CT correlate, likely representing enthesopathy. Soft tissue metastasis from esophageal malignancy is common and cannot be excluded. Recommend continued attention of follow-up exams. Otherwise no suspicious hypermetabolic finding to indicate distant metastasis. Electronically Signed   By:  Megan  Zare M.D.   On: 09/20/2023 19:52   CT CHEST ABDOMEN PELVIS W CONTRAST Result Date: 09/15/2023 CLINICAL DATA:  Adenocarcinoma of the GE junction, esophagitis, stenosis at GE junction status post dilation EXAM: CT CHEST, ABDOMEN, AND PELVIS WITH CONTRAST TECHNIQUE: Multidetector CT imaging of the chest, abdomen and pelvis was performed following the standard protocol during bolus administration of intravenous contrast. RADIATION DOSE REDUCTION: This exam was performed according to the departmental dose-optimization program which includes automated exposure control, adjustment of the mA and/or kV according to patient size and/or use of iterative reconstruction technique. CONTRAST:  OMNIPAQUE  IOHEXOL  300 MG/ML  SOLN COMPARISON:  05/04/2023, 09/08/2023 FINDINGS: CT CHEST FINDINGS Cardiovascular: The heart is unremarkable without pericardial effusion. No evidence of thoracic aortic aneurysm or dissection. Mediastinum/Nodes: There is circumferential wall thickening of the distal esophagus at the GE junction, reference images 47 and 48 of series 2, consistent with given history of adenocarcinoma diagnosed at recent endoscopy. The remainder of the thoracic esophagus appears unremarkable. Thyroid  and trachea are normal. There are borderline enlarged mediastinal lymph nodes, increased in size since prior study. Pretracheal lymph  node image 19/2 measures 10 mm in short axis and a precarinal lymph node image 22/2 measures 13 mm in short axis. Subcarinal adenopathy measures up to 10 mm in short axis reference image 28/2. Further evaluation with PET scan may be useful. Lungs/Pleura: No acute airspace disease, effusion, or pneumothorax. The central airways are patent. Musculoskeletal: No acute or destructive bony abnormalities. Postsurgical changes are seen at the cervicothoracic junction. Spinal stimulator identified, lead within the thoracic central canal. Reconstructed images demonstrate no additional findings. CT  ABDOMEN PELVIS FINDINGS Hepatobiliary: Stable hepatic cysts. No other focal liver abnormality. Prior cholecystectomy. No biliary duct dilation. Pancreas: Unremarkable. No pancreatic ductal dilatation or surrounding inflammatory changes. Spleen: Normal in size without focal abnormality. Adrenals/Urinary Tract: The adrenals appear unremarkable. Subcentimeter left renal cortical hypodensities most consistent with cysts. No specific imaging follow-up recommended. Nonobstructing 6 mm calculus lower pole right kidney. No obstructive uropathy within either kidney. The bladder is unremarkable. Stomach/Bowel: No bowel obstruction or ileus. The appendix is surgically absent. No acute inflammatory changes. Circumferential mural thickening of the distal esophagus at the GE junction as described above, compatible with biopsy-proven adenocarcinoma. Vascular/Lymphatic: No pathologic adenopathy within the abdomen or pelvis. No significant vascular findings. Reproductive: Status post hysterectomy. No adnexal masses. Other: No free fluid or free intraperitoneal gas. Small fat containing bilateral inguinal hernias. No bowel herniation. Musculoskeletal: No acute or destructive bony abnormalities. Prior discectomy and posterior fusion spanning L1-2, L2-3, L3-4, and L4-5. Reconstructed images demonstrate no additional findings. IMPRESSION: 1. Circumferential mural thickening of the distal thoracic esophagus at the GE junction, compatible with given history of biopsy-proven esophageal adenocarcinoma. 2. Borderline enlarged mediastinal lymph nodes as above, increased since 05/04/2023 CT. Metastatic disease cannot be excluded, and correlation with PET scan may be useful. 3. No evidence of intra-abdominal or intrapelvic metastases. 4. Nonobstructing 6 mm right renal calculus. No obstructive uropathy. Electronically Signed   By: Ozell Daring M.D.   On: 09/15/2023 17:23   DG ESOPHAGUS W SINGLE CM (SOL OR THIN BA) Result Date:  09/08/2023 CLINICAL DATA:  Dysphagia. EXAM: ESOPHOGRAM/BARIUM SWALLOW TECHNIQUE: Single contrast examination was performed using  thin barium. FLUOROSCOPY: Radiation Exposure Index (as provided by the fluoroscopic device): 28.6 mGy Kerma COMPARISON:  None Available. FINDINGS: Limited exam as the patient could not stand. Patient was placed in a supine position at 45 degree angle and thin contrast was administered through a straw. Severe dilatation of the distal esophagus is noted with a large amount of food debris present. Severe narrowing of the gastroesophageal junction is noted consistent with bird beak deformity consistent with achalasia. Barium tablet was not administered due to this finding. IMPRESSION: Findings most consistent with achalasia resulting in severe distal esophageal dilatation and a large amount of food debris present in distal esophagus. Electronically Signed   By: Lynwood Landy Raddle M.D.   On: 09/08/2023 15:29       IMPRESSION/PLAN: 1. Gastroesophageal cancer   We reviewed this patient's current work-up. She presents today with adenocarcinoma at the GE junction. PET scan demonstrates right subcarinal and low paratracheal lymph nodes, suspicious for metastases with focal soft tissue uptake adjacent to the right ischium.  Given her medical comorbidities, it does not appear that she is a surgical candidate.  She will be discussed at our GI tumor board next week and is scheduled to meet with Dr. Cloretta again on 09/30/2023.  At this time, Dr. Dewey recommends radiation treatment given concurrently with chemotherapy.  Today, I talked to the patient and family  about the findings and work-up thus far.  We discussed the natural history of esophageal cancer and general treatment, highlighting the role of radiotherapy in the management.  We discussed the available radiation techniques, and focused on the details of logistics and delivery.  We reviewed the anticipated acute and late sequelae  associated with radiation in this setting.  The patient was encouraged to ask questions that I answered to the best of my ability. A patient consent form was discussed and signed.  We retained a copy for our records.  The patient would like to proceed with radiation and will be scheduled for CT simulation.  Dr. Dewey anticipates a 5.5 week course of treatment. We look forward to participating in this patient's care.    In a visit lasting 60 minutes, greater than 50% of the time was spent face to face discussing the patient's condition, in preparation for the discussion, and coordinating the patient's care.   The above documentation reflects my direct findings during this shared patient visit. Please see the separate note by Dr. Dewey on this date for the remainder of the patient's plan of care.    Leeroy Due, PA-C    **Disclaimer: This note was dictated with voice recognition software. Similar sounding words can inadvertently be transcribed and this note may contain transcription errors which may not have been corrected upon publication of note.**

## 2023-09-26 DIAGNOSIS — Z79891 Long term (current) use of opiate analgesic: Secondary | ICD-10-CM | POA: Diagnosis not present

## 2023-09-26 DIAGNOSIS — G894 Chronic pain syndrome: Secondary | ICD-10-CM | POA: Diagnosis not present

## 2023-09-27 DIAGNOSIS — C159 Malignant neoplasm of esophagus, unspecified: Secondary | ICD-10-CM | POA: Diagnosis not present

## 2023-09-28 ENCOUNTER — Other Ambulatory Visit: Payer: Self-pay

## 2023-09-30 ENCOUNTER — Other Ambulatory Visit: Payer: Self-pay | Admitting: *Deleted

## 2023-09-30 ENCOUNTER — Ambulatory Visit (HOSPITAL_COMMUNITY): Admit: 2023-09-30 | Admitting: Gastroenterology

## 2023-09-30 ENCOUNTER — Encounter (HOSPITAL_COMMUNITY): Payer: Self-pay | Admitting: Oncology

## 2023-09-30 ENCOUNTER — Inpatient Hospital Stay: Admitting: Oncology

## 2023-09-30 ENCOUNTER — Encounter (HOSPITAL_COMMUNITY): Payer: Self-pay

## 2023-09-30 ENCOUNTER — Encounter: Payer: Self-pay | Admitting: Internal Medicine

## 2023-09-30 VITALS — BP 112/58 | HR 60 | Temp 97.9°F | Resp 18 | Ht 61.0 in | Wt 183.6 lb

## 2023-09-30 DIAGNOSIS — F1020 Alcohol dependence, uncomplicated: Secondary | ICD-10-CM | POA: Diagnosis not present

## 2023-09-30 DIAGNOSIS — Z87891 Personal history of nicotine dependence: Secondary | ICD-10-CM | POA: Diagnosis not present

## 2023-09-30 DIAGNOSIS — C160 Malignant neoplasm of cardia: Secondary | ICD-10-CM | POA: Diagnosis not present

## 2023-09-30 DIAGNOSIS — C159 Malignant neoplasm of esophagus, unspecified: Secondary | ICD-10-CM

## 2023-09-30 DIAGNOSIS — R1310 Dysphagia, unspecified: Secondary | ICD-10-CM | POA: Diagnosis not present

## 2023-09-30 DIAGNOSIS — J449 Chronic obstructive pulmonary disease, unspecified: Secondary | ICD-10-CM | POA: Diagnosis not present

## 2023-09-30 SURGERY — MANOMETRY, ESOPHAGUS

## 2023-09-30 NOTE — Progress Notes (Signed)
 The proposed treatment discussed in conference is for discussion purpose only and is not a binding recommendation.  The patients have not been physically examined, or presented with their treatment options.  Therefore, final treatment plans cannot be decided.

## 2023-09-30 NOTE — Progress Notes (Signed)
 [facility] OFFICE PROGRESS NOTE   Diagnosis: Gastroesophageal cancer  INTERVAL HISTORY:   [name] returns as scheduled.  She continues to have dysphagia.  She reports dysphagia after eating at a pancake restaurant last night.  She had difficulty swallowing liquids after the solid meal, but in general she is tolerating liquids.  No new complaint.  She saw Dr. Dewey and is scheduled for radiation simulation next week.  Objective:  Vital signs in last 24 hours:  Blood pressure (!) 112/58, pulse 60, temperature 97.9 F (36.6 C), temperature source Temporal, resp. rate 18, height 5' 1 (1.549 m), weight 183 lb 9.6 oz (83.3 kg), SpO2 99%.   Lymphatics: No cervical or supraclavicular nodes Resp: Lungs clear bilaterally Cardio: Regular rate and rhythm GI: No hepatomegaly, nontender Vascular: No leg edema  Lab Results:  Lab Results  Component Value Date   WBC 14.8 (H) 09/12/2023   HGB 13.3 09/12/2023   HCT 41.6 09/12/2023   MCV 90.4 09/12/2023   PLT 257 09/12/2023   NEUTROABS 6.2 09/22/2021    CMP  Lab Results  Component Value Date   NA 140 09/12/2023   K 4.7 09/12/2023   CL 103 09/12/2023   CO2 27 09/12/2023   GLUCOSE 93 09/12/2023   BUN 20 09/12/2023   CREATININE 1.06 (H) 09/12/2023   CALCIUM  9.1 09/12/2023   PROT 6.2 02/06/2014   ALBUMIN  3.7 02/06/2014   AST 21 02/06/2014   ALT 17 02/06/2014   ALKPHOS 121 (H) 02/06/2014   BILITOT 0.3 02/06/2014   GFRNONAA 54 (L) 09/12/2023   GFRAA >60 09/06/2017    No results found for: CEA1, CEA, CAN199, CA125  Lab Results  Component Value Date   INR 2.6 (H) 07/04/2008   LABPROT 29.9 (H) 07/04/2008    Imaging:  No results found.  Medications: I have reviewed the patient's current medications.   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  Non-hypermetabolic right middle lobe perifissural nodule below PET resolution and stable from prior, right subcarinal and low paratracheal lymph nodes suspicious for metastases, focal soft tissue uptake adjacent to the right ischium without CT correlate COPD Chronic back pain Bipolar disorder Migraine headaches Left leg DVT in 2010 Fibromyalgia Arthritis of the knees Lymphedema of the legs 10.  History of tobacco and alcohol  use, none at present 11.   Alcoholism 12.  Solid dysphagia and anterior chest pain secondary to #1     Disposition: [name] appears unchanged.  She has been diagnosed with gastroesophageal cancer.  She is symptomatic with dysphagia.  Her case was presented at the GI tumor conference on 09/28/2023.  The tumor conference recommendation is to proceed with chemotherapy and radiation.  She appears to have disease localized to the gastro esophagus and potentially mediastinal lymph nodes.  [name] has multiple comorbid conditions including COPD.  She reports she is limited by dyspnea with walking a few feet and becomes dyspneic when talking.  She is limited in her activity level secondary to COPD and back pain.  I offered a surgical  referral.  She does not wish to consider surgery.   She saw Dr. Dewey and is scheduled to undergo radiation simulation next week.  I recommend concurrent paclitaxel/carboplatin chemotherapy.  She will be a candidate for a PD-1 inhibitor in addition to chemotherapy if she develops progressive  disease.  We discussed the goals of paclitaxel/carboplatin and radiation are to palliate her symptoms, extend survival and, potentially lead to a long-term disease-free survival.  She understands the chance of curative therapy is small.  We reviewed potential toxicities associated with the paclitaxel/carboplatin regimen including the chance of nausea, alopecia, hematologic toxicity, infection, bleeding, and an allergic reaction.  We discussed the neuropathy, ototoxicity, and bone pain associated with paclitaxel.  She agrees to proceed.  She will attend a chemotherapy teaching class.  The plan is to begin radiation and chemotherapy during the week of 10/10/2023.  She will call for inability to tolerate liquids.  A treatment plan was entered today.   Arley Hof, MD  09/30/2023  2:14 PM

## 2023-09-30 NOTE — Progress Notes (Signed)
 START ON PATHWAY REGIMEN - Gastroesophageal     A cycle is every 7 days, concurrent with RT:     Paclitaxel      Carboplatin   **Always confirm dose/schedule in your pharmacy ordering system**  Patient Characteristics: Esophageal & GE Junction, Adenocarcinoma, Preoperative or Nonsurgical Candidate, M0 (Clinical Staging), cT2 or Higher or cN+, Surgical Candidate (Up to cT4a), GE Junction, MSS/pMMR or MSI Unknown Therapeutic Status: Preoperative or Nonsurgical Candidate, M0 (Clinical Staging) Histology: Adenocarcinoma Disease Classification: GE Junction AJCC Grade: G2 AJCC 8 Stage Grouping: Unknown AJCC T Category: cTX AJCC N Category: cN1 AJCC M Category: cM0 Microsatellite/Mismatch Repair Status: MSS/pMMR Intent of Therapy: Curative Intent, Discussed with Patient

## 2023-10-02 ENCOUNTER — Other Ambulatory Visit: Payer: Self-pay

## 2023-10-03 ENCOUNTER — Encounter (HOSPITAL_COMMUNITY): Payer: Self-pay | Admitting: Oncology

## 2023-10-03 NOTE — Progress Notes (Signed)
 Pharmacist Chemotherapy Monitoring - Initial Assessment    Anticipated start date: 10/14/23   The following has been reviewed per standard work regarding the patient's treatment regimen: The patient's diagnosis, treatment plan and drug doses, and organ/hematologic function Lab orders and baseline tests specific to treatment regimen  The treatment plan start date, drug sequencing, and pre-medications Prior authorization status  Patient's documented medication list, including drug-drug interaction screen and prescriptions for anti-emetics and supportive care specific to the treatment regimen The drug concentrations, fluid compatibility, administration routes, and timing of the medications to be used The patient's access for treatment and lifetime cumulative dose history, if applicable  The patient's medication allergies and previous infusion related reactions, if applicable   Changes made to treatment plan:  N/A  Follow up needed:  N/A   [name], [facility], 10/03/2023  3:49 PM

## 2023-10-04 ENCOUNTER — Inpatient Hospital Stay

## 2023-10-04 ENCOUNTER — Encounter: Payer: Self-pay | Admitting: *Deleted

## 2023-10-04 DIAGNOSIS — Z87891 Personal history of nicotine dependence: Secondary | ICD-10-CM | POA: Diagnosis not present

## 2023-10-04 DIAGNOSIS — R1310 Dysphagia, unspecified: Secondary | ICD-10-CM | POA: Diagnosis not present

## 2023-10-04 DIAGNOSIS — C159 Malignant neoplasm of esophagus, unspecified: Secondary | ICD-10-CM

## 2023-10-04 DIAGNOSIS — J449 Chronic obstructive pulmonary disease, unspecified: Secondary | ICD-10-CM | POA: Diagnosis not present

## 2023-10-04 DIAGNOSIS — F1020 Alcohol dependence, uncomplicated: Secondary | ICD-10-CM | POA: Diagnosis not present

## 2023-10-04 DIAGNOSIS — C160 Malignant neoplasm of cardia: Secondary | ICD-10-CM | POA: Diagnosis not present

## 2023-10-04 LAB — CMP (CANCER CENTER ONLY)
ALT: 12 U/L (ref 0–44)
AST: 19 U/L (ref 15–41)
Albumin: 3.9 g/dL (ref 3.5–5.0)
Alkaline Phosphatase: 68 U/L (ref 38–126)
Anion gap: 12 (ref 5–15)
BUN: 23 mg/dL (ref 8–23)
CO2: 25 mmol/L (ref 22–32)
Calcium: 9.2 mg/dL (ref 8.9–10.3)
Chloride: 107 mmol/L (ref 98–111)
Creatinine: 0.97 mg/dL (ref 0.44–1.00)
GFR, Estimated: >60 mL/min (ref >60)
Glucose, Bld: 96 mg/dL (ref 70–99)
Potassium: 3.9 mmol/L (ref 3.5–5.1)
Sodium: 143 mmol/L (ref 135–145)
Total Bilirubin: 0.2 mg/dL (ref 0.0–1.2)
Total Protein: 6.4 g/dL — ABNORMAL LOW (ref 6.5–8.1)

## 2023-10-04 LAB — CBC WITH DIFFERENTIAL (CANCER CENTER ONLY)
Abs Immature Granulocytes: 0.03 K/uL (ref 0.00–0.07)
Basophils Absolute: 0.1 K/uL (ref 0.0–0.1)
Basophils Relative: 1 %
Eosinophils Absolute: 0.2 K/uL (ref 0.0–0.5)
Eosinophils Relative: 3 %
HCT: 38.8 % (ref 36.0–46.0)
Hemoglobin: 12.8 g/dL (ref 12.0–15.0)
Immature Granulocytes: 0 %
Lymphocytes Relative: 18 %
Lymphs Abs: 1.4 K/uL (ref 0.7–4.0)
MCH: 29.2 pg (ref 26.0–34.0)
MCHC: 33 g/dL (ref 30.0–36.0)
MCV: 88.6 fL (ref 80.0–100.0)
Monocytes Absolute: 0.6 K/uL (ref 0.1–1.0)
Monocytes Relative: 8 %
Neutro Abs: 5.1 K/uL (ref 1.7–7.7)
Neutrophils Relative %: 70 %
Platelet Count: 275 K/uL (ref 150–400)
RBC: 4.38 MIL/uL (ref 3.87–5.11)
RDW: 14.3 % (ref 11.5–15.5)
WBC Count: 7.4 K/uL (ref 4.0–10.5)
nRBC: 0 % (ref 0.0–0.2)

## 2023-10-04 NOTE — Progress Notes (Signed)
 PATIENT NAVIGATOR PROGRESS NOTE  Name: [name] Date: 10/04/2023 MRN: [id number]  DOB: 1946-02-06   Reason for visit: Patient education session  Comments:  Please see education note and distress screening    Time spent counseling/coordinating care: > 60 minutes

## 2023-10-05 ENCOUNTER — Ambulatory Visit
Admission: RE | Admit: 2023-10-05 | Discharge: 2023-10-05 | Disposition: A | Discharge status: Home / Self Care | Source: Ambulatory Visit | Attending: Radiation Oncology | Admitting: Radiation Oncology

## 2023-10-05 DIAGNOSIS — Z87891 Personal history of nicotine dependence: Secondary | ICD-10-CM | POA: Insufficient documentation

## 2023-10-05 DIAGNOSIS — C160 Malignant neoplasm of cardia: Secondary | ICD-10-CM | POA: Diagnosis not present

## 2023-10-08 ENCOUNTER — Other Ambulatory Visit: Payer: Self-pay | Admitting: Oncology

## 2023-10-08 ENCOUNTER — Encounter: Payer: Self-pay | Admitting: Oncology

## 2023-10-09 DIAGNOSIS — F3130 Bipolar disorder, current episode depressed, mild or moderate severity, unspecified: Secondary | ICD-10-CM | POA: Diagnosis not present

## 2023-10-09 DIAGNOSIS — E785 Hyperlipidemia, unspecified: Secondary | ICD-10-CM | POA: Diagnosis not present

## 2023-10-09 DIAGNOSIS — I10 Essential (primary) hypertension: Secondary | ICD-10-CM | POA: Diagnosis not present

## 2023-10-10 ENCOUNTER — Ambulatory Visit
Admission: RE | Admit: 2023-10-10 | Discharge: 2023-10-10 | Disposition: A | Discharge status: Home / Self Care | Source: Ambulatory Visit | Attending: Radiation Oncology | Admitting: Radiation Oncology

## 2023-10-10 DIAGNOSIS — C160 Malignant neoplasm of cardia: Secondary | ICD-10-CM | POA: Insufficient documentation

## 2023-10-10 DIAGNOSIS — Z23 Encounter for immunization: Secondary | ICD-10-CM | POA: Insufficient documentation

## 2023-10-11 ENCOUNTER — Telehealth: Payer: Self-pay | Admitting: *Deleted

## 2023-10-11 ENCOUNTER — Ambulatory Visit: Admitting: Radiation Oncology

## 2023-10-11 ENCOUNTER — Encounter: Payer: Self-pay | Admitting: Oncology

## 2023-10-11 DIAGNOSIS — C159 Malignant neoplasm of esophagus, unspecified: Secondary | ICD-10-CM | POA: Diagnosis not present

## 2023-10-11 DIAGNOSIS — C160 Malignant neoplasm of cardia: Secondary | ICD-10-CM | POA: Diagnosis not present

## 2023-10-11 DIAGNOSIS — Z23 Encounter for immunization: Secondary | ICD-10-CM | POA: Diagnosis not present

## 2023-10-11 MED ORDER — ONDANSETRON HCL 8 MG PO TABS: 8.0000 mg | ORAL_TABLET | Freq: Three times a day (TID) | ORAL | 1 refills | Status: DC | PRN

## 2023-10-11 MED ORDER — DEXAMETHASONE 2 MG PO TABS: 10.0000 mg | ORAL_TABLET | ORAL | 0 refills | Status: DC

## 2023-10-11 MED ORDER — PROCHLORPERAZINE MALEATE 5 MG PO TABS: 5.0000 mg | ORAL_TABLET | Freq: Four times a day (QID) | ORAL | 0 refills | Status: DC | PRN

## 2023-10-11 NOTE — Telephone Encounter (Signed)
 Ms. Corso left VM asking about steroid script for her treatment on 9/5. Dexamethasone  script sent as well as for antiemetics.

## 2023-10-12 ENCOUNTER — Other Ambulatory Visit: Payer: Self-pay

## 2023-10-12 ENCOUNTER — Encounter (HOSPITAL_COMMUNITY)

## 2023-10-12 DIAGNOSIS — C159 Malignant neoplasm of esophagus, unspecified: Secondary | ICD-10-CM | POA: Diagnosis not present

## 2023-10-12 DIAGNOSIS — C160 Malignant neoplasm of cardia: Secondary | ICD-10-CM | POA: Diagnosis not present

## 2023-10-12 DIAGNOSIS — Z510 Encounter for antineoplastic radiation therapy: Secondary | ICD-10-CM | POA: Diagnosis not present

## 2023-10-12 LAB — RAD ONC ARIA SESSION SUMMARY
Course Elapsed Days: 0
Plan Fractions Treated to Date: 1
Plan Prescribed Dose Per Fraction: 1.8 Gy
Plan Total Fractions Prescribed: 25
Plan Total Prescribed Dose: 45 Gy
Reference Point Dosage Given to Date: 1.8 Gy
Reference Point Session Dosage Given: 1.8 Gy
Session Number: 1

## 2023-10-13 ENCOUNTER — Ambulatory Visit
Admission: RE | Admit: 2023-10-13 | Discharge: 2023-10-13 | Disposition: A | Discharge status: Home / Self Care | Source: Ambulatory Visit | Attending: Radiation Oncology

## 2023-10-13 ENCOUNTER — Other Ambulatory Visit: Payer: Self-pay

## 2023-10-13 DIAGNOSIS — C160 Malignant neoplasm of cardia: Secondary | ICD-10-CM | POA: Diagnosis not present

## 2023-10-13 LAB — RAD ONC ARIA SESSION SUMMARY
Course Elapsed Days: 1
Plan Fractions Treated to Date: 2
Plan Prescribed Dose Per Fraction: 1.8 Gy
Plan Total Fractions Prescribed: 25
Plan Total Prescribed Dose: 45 Gy
Reference Point Dosage Given to Date: 3.6 Gy
Reference Point Session Dosage Given: 1.8 Gy
Session Number: 2

## 2023-10-14 ENCOUNTER — Ambulatory Visit
Admission: RE | Admit: 2023-10-14 | Discharge: 2023-10-14 | Disposition: A | Discharge status: Home / Self Care | Source: Ambulatory Visit | Attending: Radiation Oncology | Admitting: Radiation Oncology

## 2023-10-14 ENCOUNTER — Other Ambulatory Visit: Payer: Self-pay

## 2023-10-14 ENCOUNTER — Other Ambulatory Visit: Payer: Self-pay | Admitting: Nurse Practitioner

## 2023-10-14 ENCOUNTER — Encounter: Payer: Self-pay | Admitting: Oncology

## 2023-10-14 ENCOUNTER — Inpatient Hospital Stay

## 2023-10-14 VITALS — BP 145/74 | HR 69 | Temp 98.1°F | Resp 18

## 2023-10-14 DIAGNOSIS — R1319 Other dysphagia: Secondary | ICD-10-CM | POA: Insufficient documentation

## 2023-10-14 DIAGNOSIS — Z23 Encounter for immunization: Secondary | ICD-10-CM | POA: Diagnosis not present

## 2023-10-14 DIAGNOSIS — F1091 Alcohol use, unspecified, in remission: Secondary | ICD-10-CM | POA: Insufficient documentation

## 2023-10-14 DIAGNOSIS — Z87891 Personal history of nicotine dependence: Secondary | ICD-10-CM | POA: Insufficient documentation

## 2023-10-14 DIAGNOSIS — Z5111 Encounter for antineoplastic chemotherapy: Secondary | ICD-10-CM | POA: Insufficient documentation

## 2023-10-14 DIAGNOSIS — C160 Malignant neoplasm of cardia: Secondary | ICD-10-CM | POA: Insufficient documentation

## 2023-10-14 LAB — RAD ONC ARIA SESSION SUMMARY
Course Elapsed Days: 2
Plan Fractions Treated to Date: 3
Plan Prescribed Dose Per Fraction: 1.8 Gy
Plan Total Fractions Prescribed: 25
Plan Total Prescribed Dose: 45 Gy
Reference Point Dosage Given to Date: 5.4 Gy
Reference Point Session Dosage Given: 1.8 Gy
Session Number: 3

## 2023-10-14 MED ORDER — SODIUM CHLORIDE 0.9 % IV SOLN
175.8000 mg | Freq: Once | INTRAVENOUS | Status: AC
  Administered 2023-10-14: 180 mg via INTRAVENOUS
  Filled 2023-10-14: qty 18

## 2023-10-14 MED ORDER — SONAFINE EX EMUL
1.0000 | Freq: Once | CUTANEOUS | Status: AC
  Administered 2023-10-14: 1 via TOPICAL

## 2023-10-14 MED ORDER — FAMOTIDINE IN NACL 20-0.9 MG/50ML-% IV SOLN
20.0000 mg | Freq: Once | INTRAVENOUS | Status: AC
  Administered 2023-10-14: 20 mg via INTRAVENOUS
  Filled 2023-10-14: qty 50

## 2023-10-14 MED ORDER — PALONOSETRON HCL INJECTION 0.25 MG/5ML
0.2500 mg | Freq: Once | INTRAVENOUS | Status: AC
  Administered 2023-10-14: 0.25 mg via INTRAVENOUS
  Filled 2023-10-14: qty 5

## 2023-10-14 MED ORDER — DIPHENHYDRAMINE HCL 50 MG/ML IJ SOLN
25.0000 mg | Freq: Once | INTRAMUSCULAR | Status: AC
  Administered 2023-10-14: 25 mg via INTRAVENOUS
  Filled 2023-10-14: qty 1

## 2023-10-14 MED ORDER — ACETAMINOPHEN 325 MG PO TABS
650.0000 mg | ORAL_TABLET | Freq: Once | ORAL | Status: AC
  Administered 2023-10-14: 650 mg via ORAL
  Filled 2023-10-14: qty 2

## 2023-10-14 MED ORDER — SODIUM CHLORIDE 0.9 % IV SOLN
50.0000 mg/m2 | Freq: Once | INTRAVENOUS | Status: AC
  Administered 2023-10-14: 96 mg via INTRAVENOUS
  Filled 2023-10-14: qty 16

## 2023-10-14 MED ORDER — DEXAMETHASONE SODIUM PHOSPHATE 10 MG/ML IJ SOLN
10.0000 mg | Freq: Once | INTRAMUSCULAR | Status: AC
  Administered 2023-10-14: 10 mg via INTRAVENOUS
  Filled 2023-10-14: qty 1

## 2023-10-14 MED ORDER — SODIUM CHLORIDE 0.9 % IV SOLN: INTRAVENOUS | Status: DC

## 2023-10-14 NOTE — Patient Instructions (Signed)
 [facility] - A DEPT OF [facility]  Discharge Instructions: Thank you for choosing [facility] to provide your oncology and hematology care.   If you have a lab appointment with the Cancer Center, please go directly to the Cancer Center and check in at the registration area.   Wear comfortable clothing and clothing appropriate for easy access to any Portacath or PICC line.   We strive to give you quality time with your provider. You may need to reschedule your appointment if you arrive late (15 or more minutes).  Arriving late affects you and other patients whose appointments are after yours.  Also, if you miss three or more appointments without notifying the office, you may be dismissed from the clinic at the provider's discretion.      For prescription refill requests, have your pharmacy contact our office and allow 72 hours for refills to be completed.    Today you received the following chemotherapy and/or immunotherapy agents: paclitaxel , carboplatin      Carboplatin  Injection What is this medication? CARBOPLATIN  (KAR boe pla tin) treats some types of cancer. It works by slowing down the growth of cancer cells. This medicine may be used for other purposes; ask your health care provider or pharmacist if you have questions. COMMON BRAND NAME(S): Paraplatin  What should I tell my care team before I take this medication? They need to know if you have any of these conditions: Blood disorders Hearing problems Kidney disease Recent or ongoing radiation therapy An unusual or allergic reaction to carboplatin , cisplatin, other medications, foods, dyes, or preservatives Pregnant or trying to get pregnant Breast-feeding How should I use this medication? This medication is injected into a vein. It is given by your care team in a hospital or clinic setting. Talk to your care team about the use of this medication in children. Special care may be  needed. Overdosage: If you think you have taken too much of this medicine contact a poison control center or emergency room at once. NOTE: This medicine is only for you. Do not share this medicine with others. What if I miss a dose? Keep appointments for follow-up doses. It is important not to miss your dose. Call your care team if you are unable to keep an appointment. What may interact with this medication? Medications for seizures Some antibiotics, such as amikacin, gentamicin, neomycin, streptomycin, tobramycin Vaccines This list may not describe all possible interactions. Give your health care provider a list of all the medicines, herbs, non-prescription drugs, or dietary supplements you use. Also tell them if you smoke, drink alcohol , or use illegal drugs. Some items may interact with your medicine. What should I watch for while using this medication? Your condition will be monitored carefully while you are receiving this medication. You may need blood work while taking this medication. This medication may make you feel generally unwell. This is not uncommon, as chemotherapy can affect healthy cells as well as cancer cells. Report any side effects. Continue your course of treatment even though you feel ill unless your care team tells you to stop. In some cases, you may be given additional medications to help with side effects. Follow all directions for their use. This medication may increase your risk of getting an infection. Call your care team for advice if you get a fever, chills, sore throat, or other symptoms of a cold or flu. Do not treat yourself. Try to avoid being around people who are  sick. Avoid taking medications that contain aspirin, acetaminophen , ibuprofen, naproxen, or ketoprofen unless instructed by your care team. These medications may hide a fever. Be careful brushing or flossing your teeth or using a toothpick because you may get an infection or bleed more easily. If you  have any dental work done, tell your dentist you are receiving this medication. Talk to your care team if you wish to become pregnant or think you might be pregnant. This medication can cause serious birth defects. Talk to your care team about effective forms of contraception. Do not breast-feed while taking this medication. What side effects may I notice from receiving this medication? Side effects that you should report to your care team as soon as possible: Allergic reactions--skin rash, itching, hives, swelling of the face, lips, tongue, or throat Infection--fever, chills, cough, sore throat, wounds that don't heal, pain or trouble when passing urine, general feeling of discomfort or being unwell Low red blood cell level--unusual weakness or fatigue, dizziness, headache, trouble breathing Pain, tingling, or numbness in the hands or feet, muscle weakness, change in vision, confusion or trouble speaking, loss of balance or coordination, trouble walking, seizures Unusual bruising or bleeding Side effects that usually do not require medical attention (report to your care team if they continue or are bothersome): Hair loss Nausea Unusual weakness or fatigue Vomiting This list may not describe all possible side effects. Call your doctor for medical advice about side effects. You may report side effects to FDA at 1-800-FDA-1088. Where should I keep my medication? This medication is given in a hospital or clinic. It will not be stored at home. NOTE: This sheet is a summary. It may not cover all possible information. If you have questions about this medicine, talk to your doctor, pharmacist, or health care provider.  2024 Elsevier/Gold Standard (2021-05-19 00:00:00)  Paclitaxel  Injection What is this medication? PACLITAXEL  (PAK li TAX el) treats some types of cancer. It works by slowing down the growth of cancer cells. This medicine may be used for other purposes; ask your health care provider or  pharmacist if you have questions. COMMON BRAND NAME(S): Onxol, Taxol  What should I tell my care team before I take this medication? They need to know if you have any of these conditions: Heart disease Liver disease Low white blood cell levels An unusual or allergic reaction to paclitaxel , other medications, foods, dyes, or preservatives If you or your partner are pregnant or trying to get pregnant Breast-feeding How should I use this medication? This medication is injected into a vein. It is given by your care team in a hospital or clinic setting. Talk to your care team about the use of this medication in children. While it may be given to children for selected conditions, precautions do apply. Overdosage: If you think you have taken too much of this medicine contact a poison control center or emergency room at once. NOTE: This medicine is only for you. Do not share this medicine with others. What if I miss a dose? Keep appointments for follow-up doses. It is important not to miss your dose. Call your care team if you are unable to keep an appointment. What may interact with this medication? Do not take this medication with any of the following: Live virus vaccines Other medications may affect the way this medication works. Talk with your care team about all of the medications you take. They may suggest changes to your treatment plan to lower the risk of side effects and  to make sure your medications work as intended. This list may not describe all possible interactions. Give your health care provider a list of all the medicines, herbs, non-prescription drugs, or dietary supplements you use. Also tell them if you smoke, drink alcohol , or use illegal drugs. Some items may interact with your medicine. What should I watch for while using this medication? Your condition will be monitored carefully while you are receiving this medication. You may need blood work while taking this medication. This  medication may make you feel generally unwell. This is not uncommon as chemotherapy can affect healthy cells as well as cancer cells. Report any side effects. Continue your course of treatment even though you feel ill unless your care team tells you to stop. This medication can cause serious allergic reactions. To reduce the risk, your care team may give you other medications to take before receiving this one. Be sure to follow the directions from your care team. This medication may increase your risk of getting an infection. Call your care team for advice if you get a fever, chills, sore throat, or other symptoms of a cold or flu. Do not treat yourself. Try to avoid being around people who are sick. This medication may increase your risk to bruise or bleed. Call your care team if you notice any unusual bleeding. Be careful brushing or flossing your teeth or using a toothpick because you may get an infection or bleed more easily. If you have any dental work done, tell your dentist you are receiving this medication. Talk to your care team if you may be pregnant. Serious birth defects can occur if you take this medication during pregnancy. Talk to your care team before breastfeeding. Changes to your treatment plan may be needed. What side effects may I notice from receiving this medication? Side effects that you should report to your care team as soon as possible: Allergic reactions--skin rash, itching, hives, swelling of the face, lips, tongue, or throat Heart rhythm changes--fast or irregular heartbeat, dizziness, feeling faint or lightheaded, chest pain, trouble breathing Increase in blood pressure Infection--fever, chills, cough, sore throat, wounds that don't heal, pain or trouble when passing urine, general feeling of discomfort or being unwell Low blood pressure--dizziness, feeling faint or lightheaded, blurry vision Low red blood cell level--unusual weakness or fatigue, dizziness, headache,  trouble breathing Painful swelling, warmth, or redness of the skin, blisters or sores at the infusion site Pain, tingling, or numbness in the hands or feet Slow heartbeat--dizziness, feeling faint or lightheaded, confusion, trouble breathing, unusual weakness or fatigue Unusual bruising or bleeding Side effects that usually do not require medical attention (report to your care team if they continue or are bothersome): Diarrhea Hair loss Joint pain Loss of appetite Muscle pain Nausea Vomiting This list may not describe all possible side effects. Call your doctor for medical advice about side effects. You may report side effects to FDA at 1-800-FDA-1088. Where should I keep my medication? This medication is given in a hospital or clinic. It will not be stored at home. NOTE: This sheet is a summary. It may not cover all possible information. If you have questions about this medicine, talk to your doctor, pharmacist, or health care provider.  2024 Elsevier/Gold Standard (2021-06-16 00:00:00)   To help prevent nausea and vomiting after your treatment, we encourage you to take your nausea medication as directed.  BELOW ARE SYMPTOMS THAT SHOULD BE REPORTED IMMEDIATELY: *FEVER GREATER THAN 100.4 F (38 C) OR HIGHER *CHILLS  OR SWEATING *NAUSEA AND VOMITING THAT IS NOT CONTROLLED WITH YOUR NAUSEA MEDICATION *UNUSUAL SHORTNESS OF BREATH *UNUSUAL BRUISING OR BLEEDING *URINARY PROBLEMS (pain or burning when urinating, or frequent urination) *BOWEL PROBLEMS (unusual diarrhea, constipation, pain near the anus) TENDERNESS IN MOUTH AND THROAT WITH OR WITHOUT PRESENCE OF ULCERS (sore throat, sores in mouth, or a toothache) UNUSUAL RASH, SWELLING OR PAIN  UNUSUAL VAGINAL DISCHARGE OR ITCHING   Items with * indicate a potential emergency and should be followed up as soon as possible or go to the Emergency Department if any problems should occur.  Please show the CHEMOTHERAPY ALERT CARD or  IMMUNOTHERAPY ALERT CARD at check-in to the Emergency Department and triage nurse.  Should you have questions after your visit or need to cancel or reschedule your appointment, please contact [facility] - A DEPT OF [facility]  Dept: [phone]  and follow the prompts.  Office hours are 8:00 a.m. to 4:30 p.m. Monday - Friday. Please note that voicemails left after 4:00 p.m. may not be returned until the following business day.  We are closed weekends and major holidays. You have access to a nurse at all times for urgent questions. Please call the main number to the clinic Dept: [phone] and follow the prompts.   For any non-urgent questions, you may also contact your provider using MyChart. We now offer e-Visits for anyone 30 and older to request care online for non-urgent symptoms. For details visit [email or web address].   Also download the MyChart app! Go to the app store, search MyChart, open the app, select [facility], and log in with your MyChart username and password.

## 2023-10-14 NOTE — Progress Notes (Signed)
 Pt receiving 1st taxol  infusion today. Starting BP was 148/68 with a HR of 65. Pt VS were stable for the first 30 minutes. BP was taken after 30 minutes before next rate increase and BP was 166/80 with HR of 68. Pt was also complaining of headache at that time. Infusion was stopped and NP was notified. Pt had no other symptoms of reaction. Retook VS after 5 minutes and BP was 151/82. NP okaying to proceed with infusion and placing order for tylenol  for headache.   Pt had been running at max rate for 15 minutes and VS were taken: BP: 182/88, manual: 170/110 (verified by 2 nurses) with HR of 66. Pt was still complaining of headache, rating pain 5/10. Infusion stopped. NP was again notified and came to see pt. Pt had no other symptoms. NP giving the okay to proceed with infusion. Pt continued at max rate for the rest of the infusion.   Headache improved and VSS at discharge.

## 2023-10-15 ENCOUNTER — Other Ambulatory Visit: Payer: Self-pay | Admitting: Oncology

## 2023-10-17 ENCOUNTER — Ambulatory Visit
Admission: RE | Admit: 2023-10-17 | Discharge: 2023-10-17 | Disposition: A | Discharge status: Home / Self Care | Source: Ambulatory Visit | Attending: Radiation Oncology

## 2023-10-17 ENCOUNTER — Telehealth: Payer: Self-pay

## 2023-10-17 ENCOUNTER — Ambulatory Visit: Admitting: Radiation Oncology

## 2023-10-17 ENCOUNTER — Other Ambulatory Visit: Payer: Self-pay

## 2023-10-17 DIAGNOSIS — C159 Malignant neoplasm of esophagus, unspecified: Secondary | ICD-10-CM | POA: Diagnosis not present

## 2023-10-17 DIAGNOSIS — C160 Malignant neoplasm of cardia: Secondary | ICD-10-CM | POA: Diagnosis not present

## 2023-10-17 DIAGNOSIS — Z23 Encounter for immunization: Secondary | ICD-10-CM | POA: Diagnosis not present

## 2023-10-17 DIAGNOSIS — Z510 Encounter for antineoplastic radiation therapy: Secondary | ICD-10-CM | POA: Diagnosis not present

## 2023-10-17 LAB — RAD ONC ARIA SESSION SUMMARY
Course Elapsed Days: 5
Plan Fractions Treated to Date: 4
Plan Prescribed Dose Per Fraction: 1.8 Gy
Plan Total Fractions Prescribed: 25
Plan Total Prescribed Dose: 45 Gy
Reference Point Dosage Given to Date: 7.2 Gy
Reference Point Session Dosage Given: 1.8 Gy
Session Number: 4

## 2023-10-17 NOTE — Telephone Encounter (Signed)
 24 HR call back   Attempted to reach pt through calling mobile phone number to inquire how she felt over the weekend after her first treatment last week. No answer. Left voicemail informing pt to call cancer center if she has any questions.

## 2023-10-17 NOTE — Progress Notes (Unsigned)
 [facility]   [phone] [phone]    Patient Care Team: Stamey, Chiquita POUR, FNP as PCP - General (Family Medicine) Nori Sari SQUIBB, RN as Oncology Nurse Navigator   CHIEF COMPLAINT: Follow up cancer of the GEJ  CURRENT THERAPY: Concurrent chemoradiation with weekly carboplatin /taxol ; radiation starting 10/12/23 and chemo starting 10/17/23  INTERVAL HISTORY Ms. Rames returns for follow-up and treatment as scheduled.  She has completed week 1 chemoradiation, tolerating well thus far.  She feels tired and intermittently nauseated but medications help.  Food continues to stack up, tolerating liquids and medications.  Bowels moving without difficulty.  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  Denies rash, fever, chills, cough, chest pain, dyspnea or any other new or specific complaints.  ROS  All other systems reviewed and negative  Past Medical History:  Diagnosis Date   Anemia    Anesthesia complication    woke up during back surgery at [facility] 2020   Anxiety    Bipolar affective disorder (HCC)    Cancer (HCC)    skin cancer   Chronic kidney disease    stage 3 kidney disease   COPD (chronic obstructive pulmonary disease) (HCC)    Cough    Depression    DJD (degenerative joint disease) of lumbar spine    DVT (deep venous thrombosis) (HCC) 2010   groin left - on coumadin for 6 months   Family history of anesthesia complication    Hx: father has nausea   Fibromyalgia    GERD (gastroesophageal reflux disease)    Left leg DVT (HCC) 2010   Lymphedema 2019   Migraine    Peripheral vascular disease (HCC)    Pneumonia    PONV (postoperative nausea and vomiting)    and migraines   Pre-diabetes    Shortness of breath    exertion   Spinal cord stimulator dysfunction (HCC)    has one in,but not working   Spinal headache    Vertigo    Hx: of   Vocal cord dysfunction    sees dr. shelah     Past Surgical History:  Procedure  Laterality Date   ABDOMINAL HYSTERECTOMY  1980   partial    ANTERIOR LAT LUMBAR FUSION Left 02/15/2014   Procedure: ANTERIOR LATERAL LUMBAR INTERBODY FUSION LUMBAR TWO-THREE,LUMBAR THREE-FOUR WITH LATERAL PLATE.;  Surgeon: Victory DELENA Gunnels, MD;  Location: [facility];  Service: Neurosurgery;  Laterality: Left;  left    APPENDECTOMY     BACK SURGERY     BLEPHAROPLASTY     bilateral   BREAST ENHANCEMENT SURGERY  1989   CARPAL TUNNEL RELEASE  2006   Right hand   CATARACT EXTRACTION W/ INTRAOCULAR LENS  IMPLANT, BILATERAL     Hx: of   CERVICAL FUSION  2005   CHOLECYSTECTOMY  1989   COLONOSCOPY W/ BIOPSIES AND POLYPECTOMY     Hx: of   cyst removed from right thumb      DENTAL RESTORATION/EXTRACTION WITH X-RAY     16 teeth removed due to osteonecrosis due to fosamax   ESOPHAGOGASTRODUODENOSCOPY N/A 09/13/2023   Procedure: EGD (ESOPHAGOGASTRODUODENOSCOPY);  Surgeon: Rollin Dover, MD;  Location: [facility];  Service: Gastroenterology;  Laterality: N/A;   eyebrow and eyelid lift      2024   FINGER ARTHROSCOPY WITH CARPOMETACARPEL ([facility]) ARTHROPLASTY     thumb   FRACTURE SURGERY Right 09/2013   ankle and leg - plate and screws   Fusion  of third joint of index finger on right hand      lipoma removed right elbow      lumbar arthrodesis      LUMBAR LAMINECTOMY/DECOMPRESSION MICRODISCECTOMY Right 11/06/2012   Procedure: LUMBAR TWO THREE LUMBAR LAMINECTOMY/DECOMPRESSION MICRODISCECTOMY 1 LEVEL;  Surgeon: Victory DELENA Gunnels, MD;  Location: [facility];  Service: Neurosurgery;  Laterality: Right;   OPEN REDUCTION INTERNAL FIXATION (ORIF) DISTAL RADIAL FRACTURE Right 05/05/2018   Procedure: OPEN REDUCTION INTERNAL FIXATION (ORIF) DISTAL RADIAL FRACTURE;  Surgeon: Sissy Cough, MD;  Location: [facility];  Service: Orthopedics;  Laterality: Right;   ORIF ANKLE FRACTURE Right 2015   ORIF TIBIA FRACTURE Right 2015   PARTIAL HYSTERECTOMY  1980   recurrent right ankle surgery      right  ankle surgery      right wrist surgery      with plates and screws   ROTATOR CUFF REPAIR Right 2017   SHOULDER ARTHROSCOPY     SPINAL CORD STIMULATOR BATTERY EXCHANGE Right 09/09/2017   Procedure: SPINAL CORD STIMULATOR BATTERY EXCHANGE;  Surgeon: Mindi Mt, MD;  Location: [facility];  Service: Neurosurgery;  Laterality: Right;  SPINAL CORD STIMULATOR BATTERY EXCHANGE   SPINAL CORD STIMULATOR IMPLANT  2010   SPINAL FUSION  2018   L1, L2   TARSAL SUSPENSION     Hx; of left thumb   TONSILLECTOMY  1955   UPPER GI ENDOSCOPY     Hx: of   WISDOM TOOTH EXTRACTION       Outpatient Encounter Medications as of 10/19/2023  Medication Sig   ALPRAZolam  (XANAX ) 1 MG tablet Take 1 mg by mouth See admin instructions. Take 1 tablet (1 mg) by mouth scheduled twice daily, may take an additional dose in the afternoon, if needed for anxiety.   ARIPiprazole  (ABILIFY ) 30 MG tablet Take 30 mg by mouth at bedtime.   butalbital -acetaminophen -caffeine  (FIORICET , ESGIC ) 50-325-40 MG per tablet Take 2 tablets by mouth every 4 (four) hours as needed for headache or migraine.    chlorpheniramine (CHLOR-TRIMETON) 4 MG tablet Take 8 mg by mouth at bedtime.   cyclobenzaprine  (FLEXERIL ) 10 MG tablet Take 10 mg by mouth at bedtime.   cycloSPORINE  (RESTASIS ) 0.05 % ophthalmic emulsion Place 1 drop into both eyes 2 (two) times daily.   esomeprazole (NEXIUM) 40 MG capsule Take 40 mg by mouth daily before breakfast.     ezetimibe  (ZETIA ) 10 MG tablet Take 10 mg by mouth at bedtime.   fenofibrate (TRICOR) 145 MG tablet Take 145 mg by mouth in the morning.   fluticasone  (FLONASE ) 50 MCG/ACT nasal spray Place 2 sprays into both nostrils 2 (two) times daily.   HYDROmorphone  (DILAUDID ) 2 MG tablet Take 2 mg by mouth in the morning, at noon, and at bedtime.   lamoTRIgine  (LAMICTAL ) 200 MG tablet Take 200 mg by mouth at bedtime.   linaclotide (LINZESS) 290 MCG CAPS capsule Take 290 mcg by mouth daily before breakfast.    lisdexamfetamine (VYVANSE ) 50 MG capsule Take 50 mg by mouth daily after breakfast.   meclizine  (ANTIVERT ) 25 MG tablet Take 25 mg by mouth 3 (three) times daily as needed for dizziness.    Melatonin 12 MG TABS Take 12 mg by mouth at bedtime.   omeprazole (PRILOSEC) 20 MG capsule Take 20 mg by mouth at bedtime.     ondansetron  (ZOFRAN ) 8 MG tablet Take 1 tablet (8 mg total) by mouth every 8 (eight) hours as needed. May start taking 72 hours  after each chemotherapy treatment   Polyethyl Glycol-Propyl Glycol (SYSTANE OP) Place 1 drop into both eyes 3 (three) times daily as needed (dry/irritated eyes.).   pregabalin  (LYRICA ) 300 MG capsule Take 300 mg by mouth in the morning and at bedtime.   prochlorperazine  (COMPAZINE ) 5 MG tablet Take 1 tablet (5 mg total) by mouth every 6 (six) hours as needed for nausea or vomiting.   torsemide (DEMADEX) 20 MG tablet Take 20 mg by mouth 4 (four) times daily as needed (fluid retention.).   dexamethasone  (DECADRON ) 2 MG tablet Take 5 tablets (10 mg total) by mouth as directed. Take 5 tablets by mouth at bedtime night before 1st chemotherapy and repeat at 6 am day of 1st chemotherapy (Patient not taking: Reported on 10/19/2023)   famotidine  (PEPCID ) 20 MG tablet Take 20 mg by mouth daily as needed (mid day if needed for indigestion.). (Patient not taking: Reported on 10/19/2023)   No facility-administered encounter medications on file as of 10/19/2023.     Today's Vitals   10/19/23 0900 10/19/23 0937  BP:  (!) 120/58  Pulse:  66  Resp:  18  Temp:  97.8 F (36.6 C)  TempSrc:  Temporal  SpO2:  97%  Weight:  193 lb (87.5 kg)  Height:  5' 1 (1.549 m)  PainSc: 6     Body mass index is 36.47 kg/m.   ECOG PERFORMANCE STATUS: 1 - Symptomatic but completely ambulatory  PHYSICAL EXAM GENERAL:alert, no distress and comfortable SKIN: no rash  EYES: sclera clear LUNGS: clear with normal breathing effort HEART: regular rate & rhythm, no lower extremity  edema ABDOMEN: abdomen soft, non-tender and normal bowel sounds NEURO: alert & oriented x 3 with fluent speech  CBC    Latest Ref Rng & Units 10/19/2023    9:30 AM 10/04/2023    1:30 PM 09/12/2023    1:20 PM  CBC  WBC 4.0 - 10.5 K/uL 6.7  7.4  14.8   Hemoglobin 12.0 - 15.0 g/dL 87.9  87.1  86.6   Hematocrit 36.0 - 46.0 % 36.8  38.8  41.6   Platelets 150 - 400 K/uL 218  275  257       CMP     Latest Ref Rng & Units 10/19/2023    9:30 AM 10/04/2023    1:30 PM 09/12/2023    1:20 PM  CMP  Glucose 70 - 99 mg/dL 90  96  93   BUN 8 - 23 mg/dL 17  23  20    Creatinine 0.44 - 1.00 mg/dL 9.22  9.02  8.93   Sodium 135 - 145 mmol/L 142  143  140   Potassium 3.5 - 5.1 mmol/L 3.7  3.9  4.7   Chloride 98 - 111 mmol/L 105  107  103   CO2 22 - 32 mmol/L 26  25  27    Calcium  8.9 - 10.3 mg/dL 9.3  9.2  9.1   Total Protein 6.5 - 8.1 g/dL 6.3  6.4    Total Bilirubin 0.0 - 1.2 mg/dL 0.3  0.2    Alkaline Phos 38 - 126 U/L 71  68    AST 15 - 41 U/L 14  19    ALT 0 - 44 U/L 8  12        ASSESSMENT & PLAN: 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 upper endoscopy stenosis at the GE junction with abnormal appearance of the GE junction and gastric cardia-biopsy: Moderately differentiated adenocarcinoma, mismatch repair protein expression intact, HER2 negative (  0), PD-L1 CPS 20 09/08/2023 esophagram dilation of the distal esophagus with 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  Non-hypermetabolic right middle lobe perifissural nodule below PET resolution and stable from prior, right subcarinal and low paratracheal lymph nodes suspicious for metastases, focal soft tissue uptake adjacent to the right ischium without CT correlate Concurrent chemoradiation  starting 10/12/2023 Cycle 1 day 1 carboplatin /Taxol  10/14/2023 Cycle 1 day 8 carboplatin /Taxol  10/19/2023 COPD 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  History of tobacco and alcohol  use, none at present 11.   Alcoholism 12.  Solid dysphagia and anterior chest pain secondary to #1     Disposition: Ms. Tarquinio appears stable, s/p week 1 chemo radiation.  Tolerating well thus far with mild fatigue and nausea.  Side effects are adequately managed with supportive care at home. Able to recover and function well with adequate performance status.  She has persistent dysphagia, she has gained weight.  VSS, no signs of dehydration. Doing well without a port for now. Labs are adequate for treatment.  Proceed with cycle 1 day 8 carboplatin  and Taxol  today as scheduled, no dose adjustments.   F/up and day 15/week 3 next week as scheduled, or sooner if needed.     All questions were answered. The patient knows to call the clinic with any problems, questions or concerns. No barriers to learning were detected.   Teonia Yager K Joanny Dupree, NP 10/19/2023

## 2023-10-18 ENCOUNTER — Ambulatory Visit

## 2023-10-18 ENCOUNTER — Ambulatory Visit
Admission: RE | Admit: 2023-10-18 | Discharge: 2023-10-18 | Disposition: A | Discharge status: Home / Self Care | Source: Ambulatory Visit | Attending: Radiation Oncology

## 2023-10-18 ENCOUNTER — Encounter (HOSPITAL_COMMUNITY): Admission: RE | Payer: Self-pay | Source: Home / Self Care

## 2023-10-18 ENCOUNTER — Other Ambulatory Visit: Payer: Self-pay

## 2023-10-18 ENCOUNTER — Ambulatory Visit (HOSPITAL_COMMUNITY): Admission: RE | Admit: 2023-10-18 | Source: Home / Self Care | Admitting: Orthopedic Surgery

## 2023-10-18 DIAGNOSIS — C160 Malignant neoplasm of cardia: Secondary | ICD-10-CM | POA: Diagnosis not present

## 2023-10-18 LAB — RAD ONC ARIA SESSION SUMMARY
Course Elapsed Days: 6
Plan Fractions Treated to Date: 5
Plan Prescribed Dose Per Fraction: 1.8 Gy
Plan Total Fractions Prescribed: 25
Plan Total Prescribed Dose: 45 Gy
Reference Point Dosage Given to Date: 9 Gy
Reference Point Session Dosage Given: 1.8 Gy
Session Number: 5

## 2023-10-18 SURGERY — ARTHROPLASTY, KNEE, TOTAL
Anesthesia: Spinal | Site: Knee | Laterality: Right

## 2023-10-19 ENCOUNTER — Encounter: Payer: Self-pay | Admitting: Nurse Practitioner

## 2023-10-19 ENCOUNTER — Inpatient Hospital Stay (HOSPITAL_BASED_OUTPATIENT_CLINIC_OR_DEPARTMENT_OTHER): Admitting: Nurse Practitioner

## 2023-10-19 ENCOUNTER — Other Ambulatory Visit: Payer: Self-pay

## 2023-10-19 ENCOUNTER — Inpatient Hospital Stay

## 2023-10-19 ENCOUNTER — Ambulatory Visit
Admission: RE | Admit: 2023-10-19 | Discharge: 2023-10-19 | Disposition: A | Discharge status: Home / Self Care | Source: Ambulatory Visit | Attending: Radiation Oncology | Admitting: Radiation Oncology

## 2023-10-19 ENCOUNTER — Ambulatory Visit

## 2023-10-19 VITALS — BP 120/58 | HR 66 | Temp 97.8°F | Resp 18 | Ht 61.0 in | Wt 193.0 lb

## 2023-10-19 VITALS — BP 111/66 | HR 71 | Temp 98.1°F | Resp 18

## 2023-10-19 DIAGNOSIS — C160 Malignant neoplasm of cardia: Secondary | ICD-10-CM | POA: Diagnosis not present

## 2023-10-19 DIAGNOSIS — C159 Malignant neoplasm of esophagus, unspecified: Secondary | ICD-10-CM | POA: Diagnosis not present

## 2023-10-19 DIAGNOSIS — Z510 Encounter for antineoplastic radiation therapy: Secondary | ICD-10-CM | POA: Diagnosis not present

## 2023-10-19 DIAGNOSIS — Z23 Encounter for immunization: Secondary | ICD-10-CM | POA: Diagnosis not present

## 2023-10-19 LAB — CBC WITH DIFFERENTIAL (CANCER CENTER ONLY)
Abs Immature Granulocytes: 0.04 K/uL (ref 0.00–0.07)
Basophils Absolute: 0 K/uL (ref 0.0–0.1)
Basophils Relative: 0 %
Eosinophils Absolute: 0.2 K/uL (ref 0.0–0.5)
Eosinophils Relative: 2 %
HCT: 36.8 % (ref 36.0–46.0)
Hemoglobin: 12 g/dL (ref 12.0–15.0)
Immature Granulocytes: 1 %
Lymphocytes Relative: 7 %
Lymphs Abs: 0.5 K/uL — ABNORMAL LOW (ref 0.7–4.0)
MCH: 29.3 pg (ref 26.0–34.0)
MCHC: 32.6 g/dL (ref 30.0–36.0)
MCV: 90 fL (ref 80.0–100.0)
Monocytes Absolute: 0.2 K/uL (ref 0.1–1.0)
Monocytes Relative: 4 %
Neutro Abs: 5.7 K/uL (ref 1.7–7.7)
Neutrophils Relative %: 86 %
Platelet Count: 218 K/uL (ref 150–400)
RBC: 4.09 MIL/uL (ref 3.87–5.11)
RDW: 14.4 % (ref 11.5–15.5)
WBC Count: 6.7 K/uL (ref 4.0–10.5)
nRBC: 0 % (ref 0.0–0.2)

## 2023-10-19 LAB — RAD ONC ARIA SESSION SUMMARY
Course Elapsed Days: 7
Plan Fractions Treated to Date: 6
Plan Prescribed Dose Per Fraction: 1.8 Gy
Plan Total Fractions Prescribed: 25
Plan Total Prescribed Dose: 45 Gy
Reference Point Dosage Given to Date: 10.8 Gy
Reference Point Session Dosage Given: 1.8 Gy
Session Number: 6

## 2023-10-19 LAB — CMP (CANCER CENTER ONLY)
ALT: 8 U/L (ref 0–44)
AST: 14 U/L — ABNORMAL LOW (ref 15–41)
Albumin: 3.7 g/dL (ref 3.5–5.0)
Alkaline Phosphatase: 71 U/L (ref 38–126)
Anion gap: 11 (ref 5–15)
BUN: 17 mg/dL (ref 8–23)
CO2: 26 mmol/L (ref 22–32)
Calcium: 9.3 mg/dL (ref 8.9–10.3)
Chloride: 105 mmol/L (ref 98–111)
Creatinine: 0.77 mg/dL (ref 0.44–1.00)
GFR, Estimated: >60 mL/min (ref >60)
Glucose, Bld: 90 mg/dL (ref 70–99)
Potassium: 3.7 mmol/L (ref 3.5–5.1)
Sodium: 142 mmol/L (ref 135–145)
Total Bilirubin: 0.3 mg/dL (ref 0.0–1.2)
Total Protein: 6.3 g/dL — ABNORMAL LOW (ref 6.5–8.1)

## 2023-10-19 MED ORDER — PALONOSETRON HCL INJECTION 0.25 MG/5ML
0.2500 mg | Freq: Once | INTRAVENOUS | Status: AC
  Administered 2023-10-19: 0.25 mg via INTRAVENOUS
  Filled 2023-10-19: qty 5

## 2023-10-19 MED ORDER — SODIUM CHLORIDE 0.9 % IV SOLN
50.0000 mg/m2 | Freq: Once | INTRAVENOUS | Status: AC
  Administered 2023-10-19: 96 mg via INTRAVENOUS
  Filled 2023-10-19: qty 16

## 2023-10-19 MED ORDER — SODIUM CHLORIDE 0.9 % IV SOLN: INTRAVENOUS | Status: DC

## 2023-10-19 MED ORDER — DEXAMETHASONE SODIUM PHOSPHATE 10 MG/ML IJ SOLN
10.0000 mg | Freq: Once | INTRAMUSCULAR | Status: AC
  Administered 2023-10-19: 10 mg via INTRAVENOUS
  Filled 2023-10-19: qty 1

## 2023-10-19 MED ORDER — SODIUM CHLORIDE 0.9 % IV SOLN
175.8000 mg | Freq: Once | INTRAVENOUS | Status: AC
  Administered 2023-10-19: 180 mg via INTRAVENOUS
  Filled 2023-10-19: qty 18

## 2023-10-19 MED ORDER — FAMOTIDINE IN NACL 20-0.9 MG/50ML-% IV SOLN
20.0000 mg | Freq: Once | INTRAVENOUS | Status: AC
  Administered 2023-10-19: 20 mg via INTRAVENOUS
  Filled 2023-10-19: qty 50

## 2023-10-19 MED ORDER — DIPHENHYDRAMINE HCL 50 MG/ML IJ SOLN
25.0000 mg | Freq: Once | INTRAMUSCULAR | Status: AC
  Administered 2023-10-19: 25 mg via INTRAVENOUS
  Filled 2023-10-19: qty 1

## 2023-10-19 NOTE — Progress Notes (Signed)
 Patient seen by Olam Ned NP today  Vitals are within treatment parameters:Yes   Labs are within treatment parameters: Yes   Treatment plan has been signed: Yes   Per physician team, Patient is ready for treatment and there are NO modifications to the treatment plan.

## 2023-10-19 NOTE — Patient Instructions (Signed)
 [facility] - A DEPT OF [facility]  Discharge Instructions: Thank you for choosing [facility] to provide your oncology and hematology care.   If you have a lab appointment with the Cancer Center, please go directly to the Cancer Center and check in at the registration area.   Wear comfortable clothing and clothing appropriate for easy access to any Portacath or PICC line.   We strive to give you quality time with your provider. You may need to reschedule your appointment if you arrive late (15 or more minutes).  Arriving late affects you and other patients whose appointments are after yours.  Also, if you miss three or more appointments without notifying the office, you may be dismissed from the clinic at the provider's discretion.      For prescription refill requests, have your pharmacy contact our office and allow 72 hours for refills to be completed.    Today you received the following chemotherapy and/or immunotherapy agents: taxol  and carboplatin .      To help prevent nausea and vomiting after your treatment, we encourage you to take your nausea medication as directed.  BELOW ARE SYMPTOMS THAT SHOULD BE REPORTED IMMEDIATELY: *FEVER GREATER THAN 100.4 F (38 C) OR HIGHER *CHILLS OR SWEATING *NAUSEA AND VOMITING THAT IS NOT CONTROLLED WITH YOUR NAUSEA MEDICATION *UNUSUAL SHORTNESS OF BREATH *UNUSUAL BRUISING OR BLEEDING *URINARY PROBLEMS (pain or burning when urinating, or frequent urination) *BOWEL PROBLEMS (unusual diarrhea, constipation, pain near the anus) TENDERNESS IN MOUTH AND THROAT WITH OR WITHOUT PRESENCE OF ULCERS (sore throat, sores in mouth, or a toothache) UNUSUAL RASH, SWELLING OR PAIN  UNUSUAL VAGINAL DISCHARGE OR ITCHING   Items with * indicate a potential emergency and should be followed up as soon as possible or go to the Emergency Department if any problems should occur.  Please show the CHEMOTHERAPY ALERT CARD or  IMMUNOTHERAPY ALERT CARD at check-in to the Emergency Department and triage nurse.  Should you have questions after your visit or need to cancel or reschedule your appointment, please contact [facility] - A DEPT OF [facility]  Dept: [phone]  and follow the prompts.  Office hours are 8:00 a.m. to 4:30 p.m. Monday - Friday. Please note that voicemails left after 4:00 p.m. may not be returned until the following business day.  We are closed weekends and major holidays. You have access to a nurse at all times for urgent questions. Please call the main number to the clinic Dept: [phone] and follow the prompts.   For any non-urgent questions, you may also contact your provider using MyChart. We now offer e-Visits for anyone 56 and older to request care online for non-urgent symptoms. For details visit [email or web address].   Also download the MyChart app! Go to the app store, search MyChart, open the app, select [facility], and log in with your MyChart username and password.

## 2023-10-20 ENCOUNTER — Encounter: Payer: Self-pay | Admitting: Oncology

## 2023-10-20 ENCOUNTER — Ambulatory Visit

## 2023-10-20 ENCOUNTER — Other Ambulatory Visit: Payer: Self-pay

## 2023-10-20 ENCOUNTER — Ambulatory Visit
Admission: RE | Admit: 2023-10-20 | Discharge: 2023-10-20 | Disposition: A | Discharge status: Home / Self Care | Source: Ambulatory Visit | Attending: Radiation Oncology

## 2023-10-20 DIAGNOSIS — C160 Malignant neoplasm of cardia: Secondary | ICD-10-CM | POA: Diagnosis not present

## 2023-10-20 DIAGNOSIS — Z23 Encounter for immunization: Secondary | ICD-10-CM | POA: Diagnosis not present

## 2023-10-20 DIAGNOSIS — Z510 Encounter for antineoplastic radiation therapy: Secondary | ICD-10-CM | POA: Diagnosis not present

## 2023-10-20 DIAGNOSIS — C159 Malignant neoplasm of esophagus, unspecified: Secondary | ICD-10-CM | POA: Diagnosis not present

## 2023-10-20 LAB — RAD ONC ARIA SESSION SUMMARY
Course Elapsed Days: 8
Plan Fractions Treated to Date: 7
Plan Prescribed Dose Per Fraction: 1.8 Gy
Plan Total Fractions Prescribed: 25
Plan Total Prescribed Dose: 45 Gy
Reference Point Dosage Given to Date: 12.6 Gy
Reference Point Session Dosage Given: 1.8 Gy
Session Number: 7

## 2023-10-20 NOTE — Telephone Encounter (Signed)
 Note to Dr. Cloretta

## 2023-10-21 ENCOUNTER — Other Ambulatory Visit: Payer: Self-pay

## 2023-10-21 ENCOUNTER — Ambulatory Visit
Admission: RE | Admit: 2023-10-21 | Discharge: 2023-10-21 | Disposition: A | Discharge status: Home / Self Care | Source: Ambulatory Visit | Attending: Radiation Oncology

## 2023-10-21 ENCOUNTER — Other Ambulatory Visit: Payer: Self-pay | Admitting: Radiation Oncology

## 2023-10-21 ENCOUNTER — Ambulatory Visit

## 2023-10-21 ENCOUNTER — Ambulatory Visit
Admission: RE | Admit: 2023-10-21 | Discharge: 2023-10-21 | Disposition: A | Discharge status: Home / Self Care | Source: Ambulatory Visit | Attending: Radiation Oncology | Admitting: Radiation Oncology

## 2023-10-21 DIAGNOSIS — C160 Malignant neoplasm of cardia: Secondary | ICD-10-CM | POA: Diagnosis not present

## 2023-10-21 LAB — RAD ONC ARIA SESSION SUMMARY
Course Elapsed Days: 9
Plan Fractions Treated to Date: 8
Plan Prescribed Dose Per Fraction: 1.8 Gy
Plan Total Fractions Prescribed: 25
Plan Total Prescribed Dose: 45 Gy
Reference Point Dosage Given to Date: 14.4 Gy
Reference Point Session Dosage Given: 1.8 Gy
Session Number: 8

## 2023-10-21 MED ORDER — DEXLANSOPRAZOLE 60 MG PO CPDR: 60.0000 mg | DELAYED_RELEASE_CAPSULE | Freq: Every day | ORAL | 0 refills | Status: AC

## 2023-10-23 ENCOUNTER — Other Ambulatory Visit: Payer: Self-pay | Admitting: Oncology

## 2023-10-24 ENCOUNTER — Other Ambulatory Visit: Payer: Self-pay

## 2023-10-24 ENCOUNTER — Ambulatory Visit
Admission: RE | Admit: 2023-10-24 | Discharge: 2023-10-24 | Disposition: A | Discharge status: Home / Self Care | Source: Ambulatory Visit | Attending: Radiation Oncology | Admitting: Radiation Oncology

## 2023-10-24 ENCOUNTER — Inpatient Hospital Stay

## 2023-10-24 DIAGNOSIS — C160 Malignant neoplasm of cardia: Secondary | ICD-10-CM | POA: Diagnosis not present

## 2023-10-24 LAB — RAD ONC ARIA SESSION SUMMARY
Course Elapsed Days: 12
Plan Fractions Treated to Date: 9
Plan Prescribed Dose Per Fraction: 1.8 Gy
Plan Total Fractions Prescribed: 25
Plan Total Prescribed Dose: 45 Gy
Reference Point Dosage Given to Date: 16.2 Gy
Reference Point Session Dosage Given: 1.8 Gy
Session Number: 9

## 2023-10-24 NOTE — Progress Notes (Signed)
 Nutrition Follow-up:  Patient with GE junction cancer.  Followed by Dr Cloretta.  Started radiation on 10/12/23 and chemo on 9/8.    Met with patient and friend in RD's office.  Patient reports that up until Thursday, Friday of this past week she was tolerating foods well.  Says that over the weekend has been having increased pain when swallowing foods even liquids a times.  Was able to get down baked potato with gravy but it was painful.  Able to get down a smoothie (fruit, protein powder, yogurt and almond milk) today but it was painful.  Says that she is drinking boost shakes (160 calories, 30 g protein) at times.  Wanting to get a milkshake today after leaving radiation.  Says that the pain is effecting her the most.  Says that she burps after intake.  Verbalized that if she is unable to take in nutrition she know the next step is feeding tube.   Concerned about gaining weight.  Orbital Region: normal Buccal Region: normal Upper Arm Region: normal Thoracic and Lumbar Region: normal Temple Region: normal Clavicle Bone Region: normal Shoulder and Acromion Bone Region: normal Scapular Bone Region: normal Dorsal Hand: moderate Patellar Region: normal Anterior Thigh Region: unable to assess Posterior Calf Region: normal Edema (RD assessment): none Hair: observed Eyes: observed Mouth: observed Skin: observed Nails: observed   Medications: reviewed  Labs: reviewed  Anthropometrics:   Weight 187 lb today in RD's office 193 lb on 9/10 (Dr Andriette office) 182 lb 3.2 oz on 8/15  Weight has been stable overall as far as trends. Mostly 182-187 lb  NUTRITION DIAGNOSIS: Inadequate oral intake continues   INTERVENTION:  Recommend switching to higher calorie shake. Samples of ensure completed and boost VHC given to patient today.  She is concerned about gaining weight. Tried to explain that the small volume of food she is describing that she can tolerate is concerning and want to  pack as many calories in small amount that we can.   Encouraged using blender to puree, grind foods     MONITORING, EVALUATION, GOAL: weight trends, intake   NEXT VISIT: Monday, Sept 22 before radiation  [name] B. Dasie SOLON, CSO, LDN Registered Dietitian [phone]

## 2023-10-25 ENCOUNTER — Other Ambulatory Visit: Payer: Self-pay

## 2023-10-25 ENCOUNTER — Ambulatory Visit (HOSPITAL_BASED_OUTPATIENT_CLINIC_OR_DEPARTMENT_OTHER): Admitting: Cardiology

## 2023-10-25 ENCOUNTER — Ambulatory Visit

## 2023-10-25 ENCOUNTER — Ambulatory Visit
Admission: RE | Admit: 2023-10-25 | Discharge: 2023-10-25 | Disposition: A | Discharge status: Home / Self Care | Source: Ambulatory Visit | Attending: Radiation Oncology | Admitting: Radiation Oncology

## 2023-10-25 DIAGNOSIS — M25561 Pain in right knee: Secondary | ICD-10-CM | POA: Diagnosis not present

## 2023-10-25 DIAGNOSIS — Z79891 Long term (current) use of opiate analgesic: Secondary | ICD-10-CM | POA: Diagnosis not present

## 2023-10-25 DIAGNOSIS — R202 Paresthesia of skin: Secondary | ICD-10-CM | POA: Diagnosis not present

## 2023-10-25 DIAGNOSIS — G894 Chronic pain syndrome: Secondary | ICD-10-CM | POA: Diagnosis not present

## 2023-10-25 DIAGNOSIS — C160 Malignant neoplasm of cardia: Secondary | ICD-10-CM | POA: Diagnosis not present

## 2023-10-25 DIAGNOSIS — M545 Low back pain, unspecified: Secondary | ICD-10-CM | POA: Diagnosis not present

## 2023-10-25 LAB — RAD ONC ARIA SESSION SUMMARY
Course Elapsed Days: 13
Plan Fractions Treated to Date: 10
Plan Prescribed Dose Per Fraction: 1.8 Gy
Plan Total Fractions Prescribed: 25
Plan Total Prescribed Dose: 45 Gy
Reference Point Dosage Given to Date: 18 Gy
Reference Point Session Dosage Given: 1.8 Gy
Session Number: 10

## 2023-10-26 ENCOUNTER — Inpatient Hospital Stay: Admitting: Oncology

## 2023-10-26 ENCOUNTER — Telehealth: Payer: Self-pay | Admitting: Radiation Oncology

## 2023-10-26 ENCOUNTER — Inpatient Hospital Stay

## 2023-10-26 ENCOUNTER — Ambulatory Visit
Admission: RE | Admit: 2023-10-26 | Discharge: 2023-10-26 | Disposition: A | Discharge status: Home / Self Care | Source: Ambulatory Visit | Attending: Radiation Oncology | Admitting: Radiation Oncology

## 2023-10-26 ENCOUNTER — Ambulatory Visit

## 2023-10-26 ENCOUNTER — Other Ambulatory Visit: Payer: Self-pay

## 2023-10-26 ENCOUNTER — Other Ambulatory Visit: Payer: Self-pay | Admitting: *Deleted

## 2023-10-26 VITALS — BP 161/74 | HR 78 | Temp 98.3°F | Resp 18

## 2023-10-26 VITALS — BP 117/64 | HR 74 | Temp 98.6°F | Resp 18 | Ht 61.0 in | Wt 186.0 lb

## 2023-10-26 DIAGNOSIS — C160 Malignant neoplasm of cardia: Secondary | ICD-10-CM

## 2023-10-26 DIAGNOSIS — Z23 Encounter for immunization: Secondary | ICD-10-CM | POA: Diagnosis not present

## 2023-10-26 LAB — RAD ONC ARIA SESSION SUMMARY
Course Elapsed Days: 14
Plan Fractions Treated to Date: 11
Plan Prescribed Dose Per Fraction: 1.8 Gy
Plan Total Fractions Prescribed: 25
Plan Total Prescribed Dose: 45 Gy
Reference Point Dosage Given to Date: 19.8 Gy
Reference Point Session Dosage Given: 1.8 Gy
Session Number: 11

## 2023-10-26 LAB — CBC WITH DIFFERENTIAL (CANCER CENTER ONLY)
Abs Immature Granulocytes: 0.03 K/uL (ref 0.00–0.07)
Basophils Absolute: 0 K/uL (ref 0.0–0.1)
Basophils Relative: 1 %
Eosinophils Absolute: 0.1 K/uL (ref 0.0–0.5)
Eosinophils Relative: 2 %
HCT: 35.5 % — ABNORMAL LOW (ref 36.0–46.0)
Hemoglobin: 11.9 g/dL — ABNORMAL LOW (ref 12.0–15.0)
Immature Granulocytes: 1 %
Lymphocytes Relative: 4 %
Lymphs Abs: 0.2 K/uL — ABNORMAL LOW (ref 0.7–4.0)
MCH: 29.2 pg (ref 26.0–34.0)
MCHC: 33.5 g/dL (ref 30.0–36.0)
MCV: 87.2 fL (ref 80.0–100.0)
Monocytes Absolute: 0.6 K/uL (ref 0.1–1.0)
Monocytes Relative: 11 %
Neutro Abs: 4.1 K/uL (ref 1.7–7.7)
Neutrophils Relative %: 81 %
Platelet Count: 209 K/uL (ref 150–400)
RBC: 4.07 MIL/uL (ref 3.87–5.11)
RDW: 13.9 % (ref 11.5–15.5)
WBC Count: 5 K/uL (ref 4.0–10.5)
nRBC: 0 % (ref 0.0–0.2)

## 2023-10-26 LAB — CMP (CANCER CENTER ONLY)
ALT: 5 U/L (ref 0–44)
AST: 16 U/L (ref 15–41)
Albumin: 4 g/dL (ref 3.5–5.0)
Alkaline Phosphatase: 68 U/L (ref 38–126)
Anion gap: 12 (ref 5–15)
BUN: 9 mg/dL (ref 8–23)
CO2: 26 mmol/L (ref 22–32)
Calcium: 9.6 mg/dL (ref 8.9–10.3)
Chloride: 105 mmol/L (ref 98–111)
Creatinine: 0.73 mg/dL (ref 0.44–1.00)
GFR, Estimated: >60 mL/min (ref >60)
Glucose, Bld: 102 mg/dL — ABNORMAL HIGH (ref 70–99)
Potassium: 3.8 mmol/L (ref 3.5–5.1)
Sodium: 142 mmol/L (ref 135–145)
Total Bilirubin: 0.2 mg/dL (ref 0.0–1.2)
Total Protein: 6.7 g/dL (ref 6.5–8.1)

## 2023-10-26 MED ORDER — PALONOSETRON HCL INJECTION 0.25 MG/5ML
0.2500 mg | Freq: Once | INTRAVENOUS | Status: AC
  Administered 2023-10-26: 0.25 mg via INTRAVENOUS
  Filled 2023-10-26: qty 5

## 2023-10-26 MED ORDER — FAMOTIDINE IN NACL 20-0.9 MG/50ML-% IV SOLN
20.0000 mg | Freq: Once | INTRAVENOUS | Status: AC
  Administered 2023-10-26: 20 mg via INTRAVENOUS
  Filled 2023-10-26: qty 50

## 2023-10-26 MED ORDER — SUCRALFATE 1 GM/10ML PO SUSP: 1.0000 g | Freq: Four times a day (QID) | ORAL | 0 refills | Status: DC

## 2023-10-26 MED ORDER — SODIUM CHLORIDE 0.9 % IV SOLN: INTRAVENOUS | Status: AC

## 2023-10-26 MED ORDER — INFLUENZA VAC SPLIT HIGH-DOSE 0.5 ML IM SUSY
0.5000 mL | PREFILLED_SYRINGE | Freq: Once | INTRAMUSCULAR | Status: AC
  Administered 2023-10-26: 0.5 mL via INTRAMUSCULAR
  Filled 2023-10-26: qty 0.5

## 2023-10-26 MED ORDER — DIPHENHYDRAMINE HCL 50 MG/ML IJ SOLN
25.0000 mg | Freq: Once | INTRAMUSCULAR | Status: AC
  Administered 2023-10-26: 25 mg via INTRAVENOUS
  Filled 2023-10-26: qty 1

## 2023-10-26 MED ORDER — SODIUM CHLORIDE 0.9 % IV SOLN
175.8000 mg | Freq: Once | INTRAVENOUS | Status: AC
  Administered 2023-10-26: 180 mg via INTRAVENOUS
  Filled 2023-10-26: qty 18

## 2023-10-26 MED ORDER — SODIUM CHLORIDE 0.9 % IV SOLN
50.0000 mg/m2 | Freq: Once | INTRAVENOUS | Status: AC
  Administered 2023-10-26: 96 mg via INTRAVENOUS
  Filled 2023-10-26: qty 16

## 2023-10-26 MED ORDER — DEXAMETHASONE SODIUM PHOSPHATE 10 MG/ML IJ SOLN
10.0000 mg | Freq: Once | INTRAMUSCULAR | Status: AC
  Administered 2023-10-26: 10 mg via INTRAVENOUS
  Filled 2023-10-26: qty 1

## 2023-10-26 MED ORDER — SODIUM CHLORIDE 0.9 % IV SOLN: INTRAVENOUS | Status: DC

## 2023-10-26 NOTE — Patient Instructions (Signed)
 [facility] - A DEPT OF [facility]  Discharge Instructions: Thank you for choosing [facility] to provide your oncology and hematology care.   If you have a lab appointment with the Cancer Center, please go directly to the Cancer Center and check in at the registration area.   Wear comfortable clothing and clothing appropriate for easy access to any Portacath or PICC line.   We strive to give you quality time with your provider. You may need to reschedule your appointment if you arrive late (15 or more minutes).  Arriving late affects you and other patients whose appointments are after yours.  Also, if you miss three or more appointments without notifying the office, you may be dismissed from the clinic at the provider's discretion.      For prescription refill requests, have your pharmacy contact our office and allow 72 hours for refills to be completed.    Today you received the following chemotherapy and/or immunotherapy agents: taxol  and carboplatin .      To help prevent nausea and vomiting after your treatment, we encourage you to take your nausea medication as directed.  BELOW ARE SYMPTOMS THAT SHOULD BE REPORTED IMMEDIATELY: *FEVER GREATER THAN 100.4 F (38 C) OR HIGHER *CHILLS OR SWEATING *NAUSEA AND VOMITING THAT IS NOT CONTROLLED WITH YOUR NAUSEA MEDICATION *UNUSUAL SHORTNESS OF BREATH *UNUSUAL BRUISING OR BLEEDING *URINARY PROBLEMS (pain or burning when urinating, or frequent urination) *BOWEL PROBLEMS (unusual diarrhea, constipation, pain near the anus) TENDERNESS IN MOUTH AND THROAT WITH OR WITHOUT PRESENCE OF ULCERS (sore throat, sores in mouth, or a toothache) UNUSUAL RASH, SWELLING OR PAIN  UNUSUAL VAGINAL DISCHARGE OR ITCHING   Items with * indicate a potential emergency and should be followed up as soon as possible or go to the Emergency Department if any problems should occur.  Please show the CHEMOTHERAPY ALERT CARD or  IMMUNOTHERAPY ALERT CARD at check-in to the Emergency Department and triage nurse.  Should you have questions after your visit or need to cancel or reschedule your appointment, please contact [facility] - A DEPT OF [facility]  Dept: [phone]  and follow the prompts.  Office hours are 8:00 a.m. to 4:30 p.m. Monday - Friday. Please note that voicemails left after 4:00 p.m. may not be returned until the following business day.  We are closed weekends and major holidays. You have access to a nurse at all times for urgent questions. Please call the main number to the clinic Dept: [phone] and follow the prompts.   For any non-urgent questions, you may also contact your provider using MyChart. We now offer e-Visits for anyone 56 and older to request care online for non-urgent symptoms. For details visit [email or web address].   Also download the MyChart app! Go to the app store, search MyChart, open the app, select [facility], and log in with your MyChart username and password.

## 2023-10-26 NOTE — Telephone Encounter (Signed)
 Received call from nurse at [facility] advising pt will be delayed due to needing fluid bolus prior to treatment. Treatment team was notified via call.

## 2023-10-26 NOTE — Progress Notes (Signed)
-----   Message -----  From: Karalee Wilkie POUR, MD  Sent: 10/26/2023   1:23 PM EDT  To: Annabella Broaden  Subject: RE: G-TUBE REVIEW                              Approved.   HKM  ----- Message -----  From: Broaden Annabella  Sent: 10/26/2023   1:05 PM EDT  To: Ir Procedure Requests  Subject: G-TUBE REVIEW                                  PROCEDURE: G-TUBE PLACEMENT   REASON: Dysphagia due to esophageal cancer and radiation therapy, Malignant neoplasm of gastroesophageal junction   HISTORY: PET 09/20/23   PHYSICIAN: SHERRILL   CONTACT#[phone]

## 2023-10-26 NOTE — Progress Notes (Signed)
  Cancer Center OFFICE PROGRESS NOTE   Diagnosis: Gastroesophageal cancer  INTERVAL HISTORY:   [name] returns as scheduled.  She continues radiation and completed week 2 Taxol /carboplatin  10/19/2023.  She has nausea but no emesis.  No symptoms of an allergic reaction.  No peripheral numbness.  She continues to have severe dysphagia.  She has developed odynophagia.  She has difficulty tolerating solids.  She is able to tolerate some liquids but this is becoming more difficult.  Objective:  Vital signs in last 24 hours:  Blood pressure 117/64, pulse 74, temperature 98.6 F (37 C), temperature source Temporal, resp. rate 18, height 5' 1 (1.549 m), weight 186 lb (84.4 kg), SpO2 100%.    HEENT: No thrush or ulcers Resp: Lungs with end inspiratory rales at the lower posterior base bilaterally, no respiratory distress Cardio: Regular rate and rhythm GI: No hepatosplenomegaly Vascular: No leg edema Skin: Slightly raised 2 cm erythematous area at the right upper anterior chest  Portacath/PICC-without erythema  Lab Results:  Lab Results  Component Value Date   WBC 5.0 10/26/2023   HGB 11.9 (L) 10/26/2023   HCT 35.5 (L) 10/26/2023   MCV 87.2 10/26/2023   PLT 209 10/26/2023   NEUTROABS 4.1 10/26/2023    CMP  Lab Results  Component Value Date   NA 142 10/26/2023   K 3.8 10/26/2023   CL 105 10/26/2023   CO2 26 10/26/2023   GLUCOSE 102 (H) 10/26/2023   BUN 9 10/26/2023   CREATININE 0.73 10/26/2023   CALCIUM  9.6 10/26/2023   PROT 6.7 10/26/2023   ALBUMIN  4.0 10/26/2023   AST 16 10/26/2023   ALT 5 10/26/2023   ALKPHOS 68 10/26/2023   BILITOT 0.2 10/26/2023   GFRNONAA >60 10/26/2023   GFRAA >60 09/06/2017     Medications: I have reviewed the patient's current medications.   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 upper endoscopy stenosis at the GE junction with abnormal appearance of the GE junction and gastric cardia-biopsy: Moderately  differentiated adenocarcinoma, mismatch repair protein expression intact, HER2 negative (0), PD-L1 CPS 20 09/08/2023 esophagram dilation of the distal esophagus with 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  History of tobacco and alcohol  use, none at present 11.   Alcoholism 12.  Solid dysphagia and anterior chest pain secondary to #1 13.  Odynophagia secondary to radiation: Supra fate added 10/26/2023       Disposition: [name] has locally advanced gastroesophageal cancer.  She has completed 2 weeks of paclitaxel /carboplatin  and concurrent radiation.  She is tolerating treatment well aside from development of odynophagia.  She will begin Carafate  slurry.  She will use hydromorphone  as needed for pain.  She has increased difficulty tolerating liquids.  We will give extra IV fluids with treatment today and again next week.  She would like to  have a feeding tube placed.  We will refer her for gastrostomy tube placement and a nutrition consult.  [name] will return for an office visit and chemotherapy next week.  She received an  influenza vaccine today.  Arley Hof, MD  10/26/2023  11:49 AM

## 2023-10-26 NOTE — Progress Notes (Signed)
 Patient seen by Dr. Arley Hof today  Vitals are within treatment parameters:Yes   Labs are within treatment parameters: Yes   Treatment plan has been signed: Yes   Per physician team, Patient is ready for treatment and there are NO modifications to the treatment plan. Requesting flu vaccine today-order in under [facility]

## 2023-10-27 ENCOUNTER — Other Ambulatory Visit: Payer: Self-pay

## 2023-10-27 ENCOUNTER — Ambulatory Visit
Admission: RE | Admit: 2023-10-27 | Discharge: 2023-10-27 | Disposition: A | Discharge status: Home / Self Care | Source: Ambulatory Visit | Attending: Radiation Oncology | Admitting: Radiation Oncology

## 2023-10-27 ENCOUNTER — Telehealth: Payer: Self-pay | Admitting: *Deleted

## 2023-10-27 ENCOUNTER — Ambulatory Visit

## 2023-10-27 DIAGNOSIS — Z510 Encounter for antineoplastic radiation therapy: Secondary | ICD-10-CM | POA: Diagnosis not present

## 2023-10-27 DIAGNOSIS — C160 Malignant neoplasm of cardia: Secondary | ICD-10-CM | POA: Diagnosis not present

## 2023-10-27 DIAGNOSIS — C159 Malignant neoplasm of esophagus, unspecified: Secondary | ICD-10-CM | POA: Diagnosis not present

## 2023-10-27 DIAGNOSIS — Z23 Encounter for immunization: Secondary | ICD-10-CM | POA: Diagnosis not present

## 2023-10-27 LAB — RAD ONC ARIA SESSION SUMMARY
Course Elapsed Days: 15
Plan Fractions Treated to Date: 12
Plan Prescribed Dose Per Fraction: 1.8 Gy
Plan Total Fractions Prescribed: 25
Plan Total Prescribed Dose: 45 Gy
Reference Point Dosage Given to Date: 21.6 Gy
Reference Point Session Dosage Given: 1.8 Gy
Session Number: 12

## 2023-10-27 NOTE — Telephone Encounter (Signed)
 G-tube placement scheduled for [facility] for 11/01/2023 at 10:00. Check in at main entrance and ask to go to specials. Arrive at 0900. Nothing to eat/drink after midnight prior to procedure. This was 1st available in [facility] system. LVM for Ms. Bridgette to [employer] for message.

## 2023-10-28 ENCOUNTER — Ambulatory Visit
Admission: RE | Admit: 2023-10-28 | Discharge: 2023-10-28 | Disposition: A | Discharge status: Home / Self Care | Source: Ambulatory Visit | Attending: Radiation Oncology | Admitting: Radiation Oncology

## 2023-10-28 ENCOUNTER — Other Ambulatory Visit: Payer: Self-pay

## 2023-10-28 ENCOUNTER — Ambulatory Visit

## 2023-10-28 DIAGNOSIS — C160 Malignant neoplasm of cardia: Secondary | ICD-10-CM | POA: Diagnosis not present

## 2023-10-28 DIAGNOSIS — C159 Malignant neoplasm of esophagus, unspecified: Secondary | ICD-10-CM | POA: Diagnosis not present

## 2023-10-28 DIAGNOSIS — Z23 Encounter for immunization: Secondary | ICD-10-CM | POA: Diagnosis not present

## 2023-10-28 DIAGNOSIS — Z510 Encounter for antineoplastic radiation therapy: Secondary | ICD-10-CM | POA: Diagnosis not present

## 2023-10-28 LAB — RAD ONC ARIA SESSION SUMMARY
Course Elapsed Days: 16
Plan Fractions Treated to Date: 13
Plan Prescribed Dose Per Fraction: 1.8 Gy
Plan Total Fractions Prescribed: 25
Plan Total Prescribed Dose: 45 Gy
Reference Point Dosage Given to Date: 23.4 Gy
Reference Point Session Dosage Given: 1.8 Gy
Session Number: 13

## 2023-10-30 NOTE — H&P (Signed)
 Chief Complaint: Patient was seen in consultation today for dysphagia due to gastroesophageal junction cancer status post radiation, with consideration for G-tube placement.  Referring Provider(s): Dr. Arley Hamper, MD  Supervising Physician: Jenna Hacker  Patient Status: [facility] - Out-pt  Patient is Full Code  History of Present Illness: [name] is a 78 y.o. 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  Per Dr. Erleen progress note dated 9/17: 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 upper endoscopy stenosis at the GE junction with abnormal appearance of the GE junction and gastric cardia-biopsy: Moderately differentiated adenocarcinoma, mismatch repair protein expression intact, HER2 negative (0), PD-L1 CPS 20 09/08/2023 esophagram dilation of the distal esophagus with 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  Non-hypermetabolic right middle lobe perifissural nodule below PET resolution and stable from prior, right subcarinal and low paratracheal lymph nodes suspicious for metastases, focal soft tissue uptake adjacent to the right ischium without CT correlate Concurrent chemoradiation starting 10/12/2023   [...] Ms. Deisher returns as scheduled. She continues radiation and completed week 2 Taxol /carboplatin  10/19/2023. She has nausea but no emesis. No symptoms of an allergic reaction. No peripheral numbness. She continues to have severe dysphagia. She has developed odynophagia. She has difficulty  tolerating solids. She is able to tolerate some liquids but this is becoming more difficult.  Interventional Radiology was requested for G-tube placement. Request was reviewed and approved by Dr. Karalee. Patient is scheduled for same in IR today.   Patient is alert and laying in bed, calm. Her wife is at the bedside. Patient is currently nauseated and with a strong headache. She also complains of chronic back and joint pain from arthritis. It does hurt for her to swallow. Patient denies any fevers, chest pain, SOB, cough, abdominal pain, vomiting or bleeding.    Past Medical History:  Diagnosis Date   Anemia    Anesthesia complication    woke up during back surgery at [facility] 2020   Anxiety    Bipolar affective disorder (HCC)    Cancer (HCC)    skin cancer   Chronic kidney disease    stage 3 kidney disease   COPD (chronic obstructive pulmonary disease) (HCC)    Cough    Depression    DJD (degenerative joint disease) of lumbar spine    DVT (deep venous thrombosis) (HCC) 2010   groin left - on coumadin for 6 months   Family history of anesthesia complication    Hx: father has nausea   Fibromyalgia    GERD (gastroesophageal reflux disease)    Left leg DVT (HCC) 2010   Lymphedema 2019   Migraine    Peripheral vascular disease    Pneumonia    PONV (postoperative nausea and vomiting)    and migraines   Pre-diabetes    Shortness of breath    exertion   Spinal cord stimulator dysfunction    has one in,but not working   Spinal headache    Vertigo    Hx: of   Vocal cord dysfunction    sees dr. shelah    Past Surgical History:  Procedure Laterality Date  ABDOMINAL HYSTERECTOMY  1980   partial    ANTERIOR LAT LUMBAR FUSION Left 02/15/2014   Procedure: ANTERIOR LATERAL LUMBAR INTERBODY FUSION LUMBAR TWO-THREE,LUMBAR THREE-FOUR WITH LATERAL PLATE.;  Surgeon: Victory DELENA Gunnels, MD;  Location: [facility];  Service: Neurosurgery;  Laterality: Left;  left    APPENDECTOMY      BACK SURGERY     BLEPHAROPLASTY     bilateral   BREAST ENHANCEMENT SURGERY  1989   CARPAL TUNNEL RELEASE  2006   Right hand   CATARACT EXTRACTION W/ INTRAOCULAR LENS  IMPLANT, BILATERAL     Hx: of   CERVICAL FUSION  2005   CHOLECYSTECTOMY  1989   COLONOSCOPY W/ BIOPSIES AND POLYPECTOMY     Hx: of   cyst removed from right thumb      DENTAL RESTORATION/EXTRACTION WITH X-RAY     16 teeth removed due to osteonecrosis due to fosamax   ESOPHAGOGASTRODUODENOSCOPY N/A 09/13/2023   Procedure: EGD (ESOPHAGOGASTRODUODENOSCOPY);  Surgeon: Rollin Dover, MD;  Location: [facility];  Service: Gastroenterology;  Laterality: N/A;   eyebrow and eyelid lift      2024   FINGER ARTHROSCOPY WITH CARPOMETACARPEL ([facility]) ARTHROPLASTY     thumb   FRACTURE SURGERY Right 09/2013   ankle and leg - plate and screws   Fusion of third joint of index finger on right hand      lipoma removed right elbow      lumbar arthrodesis      LUMBAR LAMINECTOMY/DECOMPRESSION MICRODISCECTOMY Right 11/06/2012   Procedure: LUMBAR TWO THREE LUMBAR LAMINECTOMY/DECOMPRESSION MICRODISCECTOMY 1 LEVEL;  Surgeon: Victory DELENA Gunnels, MD;  Location: [facility];  Service: Neurosurgery;  Laterality: Right;   OPEN REDUCTION INTERNAL FIXATION (ORIF) DISTAL RADIAL FRACTURE Right 05/05/2018   Procedure: OPEN REDUCTION INTERNAL FIXATION (ORIF) DISTAL RADIAL FRACTURE;  Surgeon: Sissy Cough, MD;  Location: [facility];  Service: Orthopedics;  Laterality: Right;   ORIF ANKLE FRACTURE Right 2015   ORIF TIBIA FRACTURE Right 2015   PARTIAL HYSTERECTOMY  1980   recurrent right ankle surgery      right ankle surgery      right wrist surgery      with plates and screws   ROTATOR CUFF REPAIR Right 2017   SHOULDER ARTHROSCOPY     SPINAL CORD STIMULATOR BATTERY EXCHANGE Right 09/09/2017   Procedure: SPINAL CORD STIMULATOR BATTERY EXCHANGE;  Surgeon: Mindi Mt, MD;  Location: [facility];  Service: Neurosurgery;  Laterality: Right;   SPINAL CORD STIMULATOR BATTERY EXCHANGE   SPINAL CORD STIMULATOR IMPLANT  2010   SPINAL FUSION  2018   L1, L2   TARSAL SUSPENSION     Hx; of left thumb   TONSILLECTOMY  1955   UPPER GI ENDOSCOPY     Hx: of   WISDOM TOOTH EXTRACTION      Allergies: Lisinopril, Lithium, Statins, Adhesive [tape], Codeine, and Scopolamine   Medications: Prior to Admission medications   Medication Sig Start Date End Date Taking? Authorizing Provider  ALPRAZolam  (XANAX ) 1 MG tablet Take 1 mg by mouth See admin instructions. Take 1 tablet (1 mg) by mouth scheduled twice daily, may take an additional dose in the afternoon, if needed for anxiety.    [provider]  ARIPiprazole  (ABILIFY ) 30 MG tablet Take 30 mg by mouth at bedtime. 05/04/16   [provider]  butalbital -acetaminophen -caffeine  (FIORICET , ESGIC ) 50-325-40 MG per tablet Take 2 tablets by mouth every 4 (four) hours as needed for headache or migraine.  [provider]  chlorpheniramine (CHLOR-TRIMETON) 4 MG tablet Take 8 mg by mouth at bedtime.    [provider]  cyclobenzaprine  (FLEXERIL ) 10 MG tablet Take 10 mg by mouth at bedtime.    [provider]  cycloSPORINE  (RESTASIS ) 0.05 % ophthalmic emulsion Place 1 drop into both eyes 2 (two) times daily.    [provider]  dexlansoprazole  (DEXILANT ) 60 MG capsule Take 1 capsule (60 mg total) by mouth daily. 10/21/23   Dewey Rush, MD  ezetimibe  (ZETIA ) 10 MG tablet Take 10 mg by mouth at bedtime.    [provider]  fenofibrate (TRICOR) 145 MG tablet Take 145 mg by mouth in the morning.    [provider]  fluticasone  (FLONASE ) 50 MCG/ACT nasal spray Place 2 sprays into both nostrils 2 (two) times daily. 12/05/13   Shelah Lamar RAMAN, MD  HYDROmorphone  (DILAUDID ) 2 MG tablet Take 2 mg by mouth in the morning, at noon, and at bedtime. 08/23/19   [provider]  lamoTRIgine  (LAMICTAL ) 200 MG tablet Take 200 mg by mouth at  bedtime.    [provider]  linaclotide LARUE) 290 MCG CAPS capsule Take 290 mcg by mouth daily before breakfast. Patient not taking: Reported on 10/26/2023    [provider]  lisdexamfetamine (VYVANSE ) 50 MG capsule Take 50 mg by mouth daily after breakfast.    [provider]  loperamide (IMODIUM A-D) 2 MG tablet Take 2-4 mg by mouth 4 (four) times daily as needed for diarrhea or loose stools.    [provider]  meclizine  (ANTIVERT ) 25 MG tablet Take 25 mg by mouth 3 (three) times daily as needed for dizziness.     [provider]  Melatonin 12 MG TABS Take 12 mg by mouth at bedtime.    [provider]  ondansetron  (ZOFRAN ) 8 MG tablet Take 1 tablet (8 mg total) by mouth every 8 (eight) hours as needed. May start taking 72 hours after each chemotherapy treatment 10/11/23   [name], MD  Polyethyl Glycol-Propyl Glycol (SYSTANE OP) Place 1 drop into both eyes 3 (three) times daily as needed (dry/irritated eyes.).    [provider]  pregabalin  (LYRICA ) 300 MG capsule Take 300 mg by mouth in the morning and at bedtime.    [provider]  prochlorperazine  (COMPAZINE ) 5 MG tablet Take 1 tablet (5 mg total) by mouth every 6 (six) hours as needed for nausea or vomiting. 10/11/23   [name], MD  sucralfate  (CARAFATE ) 1 GM/10ML suspension Take 10 mLs (1 g total) by mouth 4 (four) times daily. 10/26/23   [name], MD  torsemide (DEMADEX) 20 MG tablet Take 20 mg by mouth 4 (four) times daily as needed (fluid retention.).    Curt Myla SAILOR, RN     Family History  Problem Relation Age of Onset   Cancer Mother        lung   Lung cancer Mother    Cancer Father    Prostate cancer Father    Heart disease Father    Asthma Sister    Rheum arthritis Paternal Grandmother     Social History   Socioeconomic History   Marital status: Married    Spouse name: Not on file   Number of children: Not on file    Years of education: Not on file   Highest education level: Not on file  Occupational History   Not on file  Tobacco Use   Smoking  status: Former    Current packs/day: 0.00    Average packs/day: 2.5 packs/day for 21.0 years (52.5 ttl pk-yrs)    Types: Cigarettes    Start date: 09/15/1965    Quit date: 09/16/1986    Years since quitting: 37.1   Smokeless tobacco: Never  Vaping Use   Vaping status: Never Used  Substance and Sexual Activity   Alcohol  use: Not Currently    Comment: recovering alcoholic sober since 1989   Drug use: Not Currently   Sexual activity: Not on file  Other Topics Concern   Not on file  Social History Narrative   Not on file   Social Drivers of Health   Financial Resource Strain: Not on file  Food Insecurity: Food Insecurity Present (09/23/2023)   Hunger Vital Sign    Worried About Running Out of Food in the Last Year: Sometimes true    Ran Out of Food in the Last Year: Sometimes true  Transportation Needs: No Transportation Needs (09/23/2023)   PRAPARE - Administrator, Civil Service (Medical): No    Lack of Transportation (Non-Medical): No  Recent Concern: Transportation Needs - Unmet Transportation Needs (09/21/2023)   PRAPARE - Administrator, Civil Service (Medical): No    Lack of Transportation (Non-Medical): Yes  Physical Activity: Not on file  Stress: Not on file  Social Connections: Unknown (06/23/2021)   Received from [facility]   Social Network    Social Network: Not on file     Review of Systems: A 12 point ROS discussed and pertinent positives are indicated in the HPI above.  All other systems are negative.  Vital Signs: BP 116/61   Pulse 68   Temp 98.5 F (36.9 C) (Oral)   Resp 15   Ht 5' 1 (1.549 m)   Wt 182 lb (82.6 kg)   SpO2 95%   BMI 34.39 kg/m   Advance Care Plan: The advanced care place/surrogate decision maker was discussed at the time of visit and the patient did not wish to discuss or was not  able to name a surrogate decision maker or provide an advance care plan.  Physical Exam Constitutional:      General: She is not in acute distress.    Appearance: Normal appearance.  HENT:     Mouth/Throat:     Mouth: Mucous membranes are dry.  Cardiovascular:     Rate and Rhythm: Normal rate and regular rhythm.     Pulses: Normal pulses.     Heart sounds: Normal heart sounds.  Pulmonary:     Effort: Pulmonary effort is normal.     Breath sounds: Normal breath sounds.  Abdominal:     General: Abdomen is flat.     Palpations: Abdomen is soft.     Tenderness: There is no abdominal tenderness.  Musculoskeletal:        General: Normal range of motion.     Cervical back: Normal range of motion.  Skin:    General: Skin is warm and dry.  Neurological:     Mental Status: She is alert and oriented to person, place, and time.  Psychiatric:        Mood and Affect: Mood normal.        Behavior: Behavior normal.        Thought Content: Thought content normal.        Judgment: Judgment normal.     Imaging: No results found.  Labs:  CBC: Recent Labs  10/04/23 1330 10/19/23 0930 10/26/23 1102 11/01/23 0917  WBC 7.4 6.7 5.0 3.5*  HGB 12.8 12.0 11.9* 11.5*  HCT 38.8 36.8 35.5* 33.6*  PLT 275 218 209 260    COAGS: Recent Labs    11/01/23 0917  INR 1.0    BMP: Recent Labs    09/12/23 1320 10/04/23 1330 10/19/23 0930 10/26/23 1102  NA 140 143 142 142  K 4.7 3.9 3.7 3.8  CL 103 107 105 105  CO2 27 25 26 26   GLUCOSE 93 96 90 102*  BUN 20 23 17 9   CALCIUM  9.1 9.2 9.3 9.6  CREATININE 1.06* 0.97 0.77 0.73  GFRNONAA 54* >60 >60 >60    LIVER FUNCTION TESTS: Recent Labs    10/04/23 1330 10/19/23 0930 10/26/23 1102  BILITOT 0.2 0.3 0.2  AST 19 14* 16  ALT 12 8 5   ALKPHOS 68 71 68  PROT 6.4* 6.3* 6.7  ALBUMIN  3.9 3.7 4.0    TUMOR MARKERS: No results for input(s): AFPTM, CEA, CA199, CHROMGRNA in the last 8760 hours.  Assessment and Plan: Per  Dr. Erleen progress note dated 9/17: [...] Ms. Dam returns as scheduled. She continues radiation and completed week 2 Taxol /carboplatin  10/19/2023. She has nausea but no emesis. No symptoms of an allergic reaction. No peripheral numbness. She continues to have severe dysphagia. She has developed odynophagia. She has difficulty tolerating solids. She is able to tolerate some liquids but this is becoming more difficult.  Patient presents for scheduled G-tube placement in IR today.  Patient has been NPO since midnight.  All labs and medications are within acceptable parameters.  Allergies reviewed: Lisinopril, tape (rash), lithium, statins, Codeine (nausea and vomiting), and scopolamine .  Risks and benefits image guided gastrostomy tube placement was discussed with the patient including, but not limited to the need for a barium enema during the procedure, bleeding, infection, peritonitis and/or damage to adjacent structures.  All of the patient's questions were answered, patient is agreeable to proceed.  Consent signed and in chart.    Thank you for allowing our service to participate in Tyan Strehl 's care.  Electronically Signed: Carlin DELENA Griffon, PA-C   11/01/2023, 10:14 AM      I spent a total of 30 Minutes in face to face in clinical consultation, greater than 50% of which was counseling/coordinating care for dysphagia due to gastroesophageal junction cancer status post radiation, with consideration for G-tube placement.

## 2023-10-31 ENCOUNTER — Other Ambulatory Visit: Payer: Self-pay

## 2023-10-31 ENCOUNTER — Ambulatory Visit
Admission: RE | Admit: 2023-10-31 | Discharge: 2023-10-31 | Disposition: A | Discharge status: Home / Self Care | Source: Ambulatory Visit | Attending: Radiation Oncology | Admitting: Radiation Oncology

## 2023-10-31 ENCOUNTER — Inpatient Hospital Stay

## 2023-10-31 DIAGNOSIS — C160 Malignant neoplasm of cardia: Secondary | ICD-10-CM | POA: Diagnosis not present

## 2023-10-31 LAB — RAD ONC ARIA SESSION SUMMARY
Course Elapsed Days: 19
Plan Fractions Treated to Date: 14
Plan Prescribed Dose Per Fraction: 1.8 Gy
Plan Total Fractions Prescribed: 25
Plan Total Prescribed Dose: 45 Gy
Reference Point Dosage Given to Date: 25.2 Gy
Reference Point Session Dosage Given: 1.8 Gy
Session Number: 14

## 2023-10-31 MED ORDER — NUTREN 1.5 EN LIQD: ENTERAL | Status: DC

## 2023-10-31 NOTE — Progress Notes (Signed)
 Patient for IR G-Tube Insertion on Tues 11/01/23, I called and LVM for the patient on the phone and gave pre-procedure instructions. VM made patient aware to be here at 9a, NPO after MN prior to procedure as well as driver post procedure/recovery/discharge. Called 10/31/23

## 2023-10-31 NOTE — Progress Notes (Signed)
 Nutrition Follow-up:  Patient with gastroesophageal cancer. Followed by Dr Cloretta.  Patient receiving concurrent radiation and chemotherapy (carboplatin  and taxol ).   Planning feeding tube placement by IR tomorrow 9/23 at [facility]  Met with patient and caregiver.  Patient continues to struggle with oral intake due to pain with swallowing and dysphagia from esophageal mass and radiation side effects.  For the past 7 days overall intake has been poor with sips of boost shakes, sips of soups.  Patient not meeting nutritional needs with oral intake, weight continues to decline.    Medications: reviewed  Labs: reviewed  Anthropometrics:   Height: 61 inches Weight: 184 lb today in RD's office 187 lb on 9/15 193 lb on 9/10 (Dr Andriette office) BMI: 35  5% weight loss in the last 12 days, concerning   Estimated Energy Needs  Kcals: 8199-7874 Protein: 85-102 g Fluid: > 1800 ml  NUTRITION DIAGNOSIS: Inadequate oral intake continues   INTERVENTION:  Educated patient and caregiver about how to care for G-tube site, how to give bolus tube feeding and how to give medication via tube.  Written instructions given. Recommend nutren 1.5, 5 cartons per day via feeding tube at this time.  Flush with 60 ml of water before and after each feeding.  Provide additional 240 ml BID for hydration.  Provides 1875 calories, 85 g protein, 2035 ml.  Meets 100% of calorie needs Anticipate long term need of feeding tube. Recommend patient to start with only 1 carton of nutrition on Wednesday 9/24.  She will add 1 carton of formula daily until she reaches 5 cartons per day.  Written instructions provided.   Sample of osmolite 1.5 and enteral nutrition supplies provided.   Contacted [employer] to provide enteral supplies.   Contact information provided     MONITORING, EVALUATION, GOAL: weight trends, tube feeding formula   NEXT VISIT: Thursday, Sept 25 phone call  Shiryl Ruddy B. Dasie SOLON, CSO, LDN Registered  Dietitian [phone]

## 2023-11-01 ENCOUNTER — Ambulatory Visit
Admission: RE | Admit: 2023-11-01 | Discharge: 2023-11-01 | Disposition: A | Discharge status: Home / Self Care | Source: Ambulatory Visit | Attending: Radiation Oncology

## 2023-11-01 ENCOUNTER — Ambulatory Visit

## 2023-11-01 ENCOUNTER — Ambulatory Visit
Admission: RE | Admit: 2023-11-01 | Discharge: 2023-11-01 | Disposition: A | Discharge status: Home / Self Care | Source: Ambulatory Visit | Attending: Oncology | Admitting: Oncology

## 2023-11-01 ENCOUNTER — Other Ambulatory Visit: Payer: Self-pay

## 2023-11-01 ENCOUNTER — Encounter: Payer: Self-pay | Admitting: Radiology

## 2023-11-01 VITALS — BP 107/59 | HR 63 | Temp 97.9°F | Resp 19 | Ht 61.0 in | Wt 182.0 lb

## 2023-11-01 DIAGNOSIS — Z9221 Personal history of antineoplastic chemotherapy: Secondary | ICD-10-CM | POA: Diagnosis not present

## 2023-11-01 DIAGNOSIS — Z86718 Personal history of other venous thrombosis and embolism: Secondary | ICD-10-CM | POA: Insufficient documentation

## 2023-11-01 DIAGNOSIS — Z923 Personal history of irradiation: Secondary | ICD-10-CM | POA: Insufficient documentation

## 2023-11-01 DIAGNOSIS — R1310 Dysphagia, unspecified: Secondary | ICD-10-CM | POA: Diagnosis not present

## 2023-11-01 DIAGNOSIS — Z87891 Personal history of nicotine dependence: Secondary | ICD-10-CM | POA: Diagnosis not present

## 2023-11-01 DIAGNOSIS — C160 Malignant neoplasm of cardia: Secondary | ICD-10-CM | POA: Diagnosis not present

## 2023-11-01 DIAGNOSIS — F319 Bipolar disorder, unspecified: Secondary | ICD-10-CM | POA: Insufficient documentation

## 2023-11-01 DIAGNOSIS — K219 Gastro-esophageal reflux disease without esophagitis: Secondary | ICD-10-CM | POA: Diagnosis not present

## 2023-11-01 DIAGNOSIS — C159 Malignant neoplasm of esophagus, unspecified: Secondary | ICD-10-CM | POA: Diagnosis not present

## 2023-11-01 DIAGNOSIS — Z79899 Other long term (current) drug therapy: Secondary | ICD-10-CM | POA: Insufficient documentation

## 2023-11-01 DIAGNOSIS — Z23 Encounter for immunization: Secondary | ICD-10-CM | POA: Diagnosis not present

## 2023-11-01 DIAGNOSIS — J449 Chronic obstructive pulmonary disease, unspecified: Secondary | ICD-10-CM | POA: Insufficient documentation

## 2023-11-01 DIAGNOSIS — Z510 Encounter for antineoplastic radiation therapy: Secondary | ICD-10-CM | POA: Diagnosis not present

## 2023-11-01 DIAGNOSIS — N183 Chronic kidney disease, stage 3 unspecified: Secondary | ICD-10-CM | POA: Insufficient documentation

## 2023-11-01 HISTORY — PX: IR GASTROSTOMY TUBE MOD SED: IMG625

## 2023-11-01 LAB — RAD ONC ARIA SESSION SUMMARY
Course Elapsed Days: 20
Plan Fractions Treated to Date: 15
Plan Prescribed Dose Per Fraction: 1.8 Gy
Plan Total Fractions Prescribed: 25
Plan Total Prescribed Dose: 45 Gy
Reference Point Dosage Given to Date: 27 Gy
Reference Point Session Dosage Given: 1.8 Gy
Session Number: 15

## 2023-11-01 LAB — CBC
HCT: 33.6 % — ABNORMAL LOW (ref 36.0–46.0)
Hemoglobin: 11.5 g/dL — ABNORMAL LOW (ref 12.0–15.0)
MCH: 29.6 pg (ref 26.0–34.0)
MCHC: 34.2 g/dL (ref 30.0–36.0)
MCV: 86.6 fL (ref 80.0–100.0)
Platelets: 260 K/uL (ref 150–400)
RBC: 3.88 MIL/uL (ref 3.87–5.11)
RDW: 14 % (ref 11.5–15.5)
WBC: 3.5 K/uL — ABNORMAL LOW (ref 4.0–10.5)
nRBC: 0 % (ref 0.0–0.2)

## 2023-11-01 LAB — PROTIME-INR
INR: 1 (ref 0.8–1.2)
Prothrombin Time: 13.8 s (ref 11.4–15.2)

## 2023-11-01 MED ORDER — IOHEXOL 300 MG/ML  SOLN
50.0000 mL | Freq: Once | INTRAMUSCULAR | Status: AC | PRN
  Administered 2023-11-01: 15 mL

## 2023-11-01 MED ORDER — ONDANSETRON 4 MG PO TBDP
4.0000 mg | ORAL_TABLET | Freq: Once | ORAL | Status: DC
  Filled 2023-11-01: qty 1

## 2023-11-01 MED ORDER — CEFAZOLIN SODIUM-DEXTROSE 2-4 GM/100ML-% IV SOLN
INTRAVENOUS | Status: AC
  Filled 2023-11-01: qty 100

## 2023-11-01 MED ORDER — SODIUM CHLORIDE 0.9 % IV SOLN: INTRAVENOUS | Status: DC

## 2023-11-01 MED ORDER — FENTANYL CITRATE (PF) 100 MCG/2ML IJ SOLN
INTRAMUSCULAR | Status: AC
  Filled 2023-11-01: qty 2

## 2023-11-01 MED ORDER — CEFAZOLIN SODIUM-DEXTROSE 2-4 GM/100ML-% IV SOLN
2.0000 g | INTRAVENOUS | Status: AC
  Administered 2023-11-01: 2 g via INTRAVENOUS

## 2023-11-01 MED ORDER — GLUCAGON HCL RDNA (DIAGNOSTIC) 1 MG IJ SOLR
INTRAMUSCULAR | Status: AC
  Filled 2023-11-01: qty 1

## 2023-11-01 MED ORDER — LIDOCAINE HCL 1 % IJ SOLN
INTRAMUSCULAR | Status: AC
  Filled 2023-11-01: qty 20

## 2023-11-01 MED ORDER — MIDAZOLAM HCL 2 MG/2ML IJ SOLN
INTRAMUSCULAR | Status: AC | PRN
  Administered 2023-11-01: 1 mg via INTRAVENOUS
  Administered 2023-11-01 (×2): .5 mg via INTRAVENOUS

## 2023-11-01 MED ORDER — FENTANYL CITRATE (PF) 100 MCG/2ML IJ SOLN
INTRAMUSCULAR | Status: AC | PRN
  Administered 2023-11-01: 50 ug via INTRAVENOUS
  Administered 2023-11-01 (×2): 25 ug via INTRAVENOUS

## 2023-11-01 MED ORDER — MIDAZOLAM HCL 2 MG/2ML IJ SOLN
INTRAMUSCULAR | Status: AC
  Filled 2023-11-01: qty 2

## 2023-11-01 MED ORDER — LIDOCAINE HCL 1 % IJ SOLN
15.0000 mL | Freq: Once | INTRAMUSCULAR | Status: AC
  Administered 2023-11-01: 15 mL

## 2023-11-01 MED ORDER — ONDANSETRON HCL 4 MG/2ML IJ SOLN
4.0000 mg | Freq: Once | INTRAMUSCULAR | Status: AC
  Administered 2023-11-01: 4 mg via INTRAVENOUS

## 2023-11-01 MED ORDER — ONDANSETRON HCL 4 MG/2ML IJ SOLN
INTRAMUSCULAR | Status: AC
  Filled 2023-11-01: qty 2

## 2023-11-01 MED ORDER — GLUCAGON HCL RDNA (DIAGNOSTIC) 1 MG IJ SOLR
INTRAMUSCULAR | Status: AC | PRN
  Administered 2023-11-01: 1 mg via INTRAVENOUS

## 2023-11-01 NOTE — Procedures (Signed)
 Interventional Radiology Procedure Note  Procedure: Gastrostomy tube placement   Complications: None  Estimated Blood Loss: < 10 mL  Findings: 18Fr Per G tube placement.  Cordella DELENA Banner, MD   \

## 2023-11-02 ENCOUNTER — Encounter: Payer: Self-pay | Admitting: Oncology

## 2023-11-02 ENCOUNTER — Ambulatory Visit

## 2023-11-02 ENCOUNTER — Other Ambulatory Visit: Payer: Self-pay | Admitting: Nurse Practitioner

## 2023-11-02 ENCOUNTER — Encounter: Payer: Self-pay | Admitting: Nurse Practitioner

## 2023-11-02 ENCOUNTER — Inpatient Hospital Stay: Admitting: Nurse Practitioner

## 2023-11-02 ENCOUNTER — Inpatient Hospital Stay

## 2023-11-02 ENCOUNTER — Ambulatory Visit
Admission: RE | Admit: 2023-11-02 | Discharge: 2023-11-02 | Disposition: A | Discharge status: Home / Self Care | Source: Ambulatory Visit | Attending: Radiation Oncology | Admitting: Radiation Oncology

## 2023-11-02 ENCOUNTER — Ambulatory Visit
Admission: RE | Admit: 2023-11-02 | Discharge: 2023-11-02 | Disposition: A | Discharge status: Home / Self Care | Source: Ambulatory Visit | Attending: Radiation Oncology

## 2023-11-02 ENCOUNTER — Other Ambulatory Visit: Payer: Self-pay

## 2023-11-02 ENCOUNTER — Other Ambulatory Visit

## 2023-11-02 VITALS — BP 107/62 | HR 74 | Temp 98.1°F | Resp 18 | Ht 61.0 in | Wt 184.0 lb

## 2023-11-02 VITALS — BP 116/80 | HR 80 | Temp 98.4°F | Resp 18

## 2023-11-02 DIAGNOSIS — C159 Malignant neoplasm of esophagus, unspecified: Secondary | ICD-10-CM

## 2023-11-02 DIAGNOSIS — C160 Malignant neoplasm of cardia: Secondary | ICD-10-CM

## 2023-11-02 DIAGNOSIS — R1310 Dysphagia, unspecified: Secondary | ICD-10-CM | POA: Diagnosis not present

## 2023-11-02 LAB — CBC WITH DIFFERENTIAL (CANCER CENTER ONLY)
Abs Immature Granulocytes: 0.05 K/uL (ref 0.00–0.07)
Basophils Absolute: 0 K/uL (ref 0.0–0.1)
Basophils Relative: 1 %
Eosinophils Absolute: 0 K/uL (ref 0.0–0.5)
Eosinophils Relative: 1 %
HCT: 32.8 % — ABNORMAL LOW (ref 36.0–46.0)
Hemoglobin: 11.2 g/dL — ABNORMAL LOW (ref 12.0–15.0)
Immature Granulocytes: 1 %
Lymphocytes Relative: 2 %
Lymphs Abs: 0.1 K/uL — ABNORMAL LOW (ref 0.7–4.0)
MCH: 29.6 pg (ref 26.0–34.0)
MCHC: 34.1 g/dL (ref 30.0–36.0)
MCV: 86.5 fL (ref 80.0–100.0)
Monocytes Absolute: 0.8 K/uL (ref 0.1–1.0)
Monocytes Relative: 13 %
Neutro Abs: 4.8 K/uL (ref 1.7–7.7)
Neutrophils Relative %: 82 %
Platelet Count: 243 K/uL (ref 150–400)
RBC: 3.79 MIL/uL — ABNORMAL LOW (ref 3.87–5.11)
RDW: 14.1 % (ref 11.5–15.5)
WBC Count: 5.7 K/uL (ref 4.0–10.5)
nRBC: 0 % (ref 0.0–0.2)

## 2023-11-02 LAB — RAD ONC ARIA SESSION SUMMARY
Course Elapsed Days: 21
Plan Fractions Treated to Date: 16
Plan Prescribed Dose Per Fraction: 1.8 Gy
Plan Total Fractions Prescribed: 25
Plan Total Prescribed Dose: 45 Gy
Reference Point Dosage Given to Date: 28.8 Gy
Reference Point Session Dosage Given: 1.8 Gy
Session Number: 16

## 2023-11-02 LAB — CMP (CANCER CENTER ONLY)
ALT: 6 U/L (ref 0–44)
AST: 12 U/L — ABNORMAL LOW (ref 15–41)
Albumin: 3.7 g/dL (ref 3.5–5.0)
Alkaline Phosphatase: 59 U/L (ref 38–126)
Anion gap: 14 (ref 5–15)
BUN: 16 mg/dL (ref 8–23)
CO2: 22 mmol/L (ref 22–32)
Calcium: 9.4 mg/dL (ref 8.9–10.3)
Chloride: 100 mmol/L (ref 98–111)
Creatinine: 0.57 mg/dL (ref 0.44–1.00)
GFR, Estimated: >60 mL/min (ref >60)
Glucose, Bld: 127 mg/dL — ABNORMAL HIGH (ref 70–99)
Potassium: 3.3 mmol/L — ABNORMAL LOW (ref 3.5–5.1)
Sodium: 136 mmol/L (ref 135–145)
Total Bilirubin: 0.4 mg/dL (ref 0.0–1.2)
Total Protein: 6.5 g/dL (ref 6.5–8.1)

## 2023-11-02 MED ORDER — PALONOSETRON HCL INJECTION 0.25 MG/5ML
0.2500 mg | Freq: Once | INTRAVENOUS | Status: AC
  Administered 2023-11-02: 0.25 mg via INTRAVENOUS
  Filled 2023-11-02: qty 5

## 2023-11-02 MED ORDER — SODIUM CHLORIDE 0.9 % IV SOLN
175.8000 mg | Freq: Once | INTRAVENOUS | Status: AC
  Administered 2023-11-02: 180 mg via INTRAVENOUS
  Filled 2023-11-02: qty 18

## 2023-11-02 MED ORDER — DEXAMETHASONE SODIUM PHOSPHATE 10 MG/ML IJ SOLN
10.0000 mg | Freq: Once | INTRAMUSCULAR | Status: AC
  Administered 2023-11-02: 10 mg via INTRAVENOUS
  Filled 2023-11-02: qty 1

## 2023-11-02 MED ORDER — HYDROMORPHONE HCL 2 MG PO TABS: 2.0000 mg | ORAL_TABLET | Freq: Every day | ORAL | 0 refills | Status: DC | PRN

## 2023-11-02 MED ORDER — SODIUM CHLORIDE 0.9 % IV SOLN
50.0000 mg/m2 | Freq: Once | INTRAVENOUS | Status: AC
  Administered 2023-11-02: 96 mg via INTRAVENOUS
  Filled 2023-11-02: qty 16

## 2023-11-02 MED ORDER — SODIUM CHLORIDE 0.9 % IV SOLN: INTRAVENOUS | Status: DC

## 2023-11-02 MED ORDER — FAMOTIDINE IN NACL 20-0.9 MG/50ML-% IV SOLN
20.0000 mg | Freq: Once | INTRAVENOUS | Status: AC
  Administered 2023-11-02: 20 mg via INTRAVENOUS
  Filled 2023-11-02: qty 50

## 2023-11-02 MED ORDER — DIPHENHYDRAMINE HCL 50 MG/ML IJ SOLN
25.0000 mg | Freq: Once | INTRAMUSCULAR | Status: AC
  Administered 2023-11-02: 25 mg via INTRAVENOUS
  Filled 2023-11-02: qty 1

## 2023-11-02 NOTE — Telephone Encounter (Signed)
Sent to NP 

## 2023-11-02 NOTE — Patient Instructions (Signed)
 [facility] - A DEPT OF [facility]  Discharge Instructions: Thank you for choosing [facility] to provide your oncology and hematology care.   If you have a lab appointment with the Cancer Center, please go directly to the Cancer Center and check in at the registration area.   Wear comfortable clothing and clothing appropriate for easy access to any Portacath or PICC line.   We strive to give you quality time with your provider. You may need to reschedule your appointment if you arrive late (15 or more minutes).  Arriving late affects you and other patients whose appointments are after yours.  Also, if you miss three or more appointments without notifying the office, you may be dismissed from the clinic at the provider's discretion.      For prescription refill requests, have your pharmacy contact our office and allow 72 hours for refills to be completed.    Today you received the following chemotherapy and/or immunotherapy agents: paclitaxel and carboplatin      To help prevent nausea and vomiting after your treatment, we encourage you to take your nausea medication as directed.  BELOW ARE SYMPTOMS THAT SHOULD BE REPORTED IMMEDIATELY: *FEVER GREATER THAN 100.4 F (38 C) OR HIGHER *CHILLS OR SWEATING *NAUSEA AND VOMITING THAT IS NOT CONTROLLED WITH YOUR NAUSEA MEDICATION *UNUSUAL SHORTNESS OF BREATH *UNUSUAL BRUISING OR BLEEDING *URINARY PROBLEMS (pain or burning when urinating, or frequent urination) *BOWEL PROBLEMS (unusual diarrhea, constipation, pain near the anus) TENDERNESS IN MOUTH AND THROAT WITH OR WITHOUT PRESENCE OF ULCERS (sore throat, sores in mouth, or a toothache) UNUSUAL RASH, SWELLING OR PAIN  UNUSUAL VAGINAL DISCHARGE OR ITCHING   Items with * indicate a potential emergency and should be followed up as soon as possible or go to the Emergency Department if any problems should occur.  Please show the CHEMOTHERAPY ALERT CARD  or IMMUNOTHERAPY ALERT CARD at check-in to the Emergency Department and triage nurse.  Should you have questions after your visit or need to cancel or reschedule your appointment, please contact [facility]  Dept: [phone]  and follow the prompts.  Office hours are 8:00 a.m. to 4:30 p.m. Monday - Friday. Please note that voicemails left after 4:00 p.m. may not be returned until the following business day.  We are closed weekends and major holidays. You have access to a nurse at all times for urgent questions. Please call the main number to the clinic Dept: [phone] and follow the prompts.   For any non-urgent questions, you may also contact your provider using MyChart. We now offer e-Visits for anyone 57 and older to request care online for non-urgent symptoms. For details visit [email or web address].   Also download the MyChart app! Go to the app store, search "MyChart", open the app, select [facility], and log in with your MyChart username and password.

## 2023-11-02 NOTE — Progress Notes (Signed)
 Patient seen by Olam Ned NP today  Vitals are within treatment parameters:Yes   Labs are within treatment parameters: Yes   Treatment plan has been signed: Yes   Per physician team, Patient is ready for treatment and there are NO modifications to the treatment plan.

## 2023-11-02 NOTE — Progress Notes (Signed)
 [facility] OFFICE PROGRESS NOTE   Diagnosis: Gastroesophageal cancer  INTERVAL HISTORY:   [name] returns as scheduled.  She continues radiation.  She completed week 3 Taxol /carboplatin  10/26/2023.  She had a feeding tube placed yesterday.  She denies nausea/vomiting following chemotherapy.  She had loose stools over the course of the day 2 to 3 days after chemotherapy.  She took Imodium with good relief.  No change in baseline neuropathy symptoms.  No mouth sores.  Swallowing is not too good.  She had a feeding tube placed yesterday.  She is tolerating liquids and small pills.  Objective:  Vital signs in last 24 hours:  Blood pressure 107/62, pulse 74, temperature 98.1 F (36.7 C), temperature source Temporal, resp. rate 18, height 5' 1 (1.549 m), weight 184 lb (83.5 kg), SpO2 98%.    HEENT: No thrush or ulcers. Resp: Lungs clear bilaterally. Cardio: Regular rate and rhythm. GI: No hepatosplenomegaly.  Feeding tube left upper abdomen, site covered with a gauze bandage. Vascular: No leg edema.   Lab Results:  Lab Results  Component Value Date   WBC 5.7 11/02/2023   HGB 11.2 (L) 11/02/2023   HCT 32.8 (L) 11/02/2023   MCV 86.5 11/02/2023   PLT 243 11/02/2023   NEUTROABS 4.8 11/02/2023    Imaging:  IR Gastrostomy Tube Result Date: 11/01/2023 INDICATION: Esophageal cancer. Planned placement of a percutaneous gastrostomy tube for nutritional support. EXAM: Gastrostomy tube placement MEDICATIONS: Glucagon  1 mg IV ANESTHESIA/SEDATION: Moderate (conscious) sedation was employed during this procedure. A total of Versed  2 mg and Fentanyl  100 mcg was administered intravenously by the radiology nurse. Total intra-service moderate Sedation Time: 25 minutes. The patient's level of consciousness and vital signs were monitored continuously by radiology nursing throughout the procedure under my direct supervision. CONTRAST:  15 mL Omnipaque  300-administered into the  gastric lumen. FLUOROSCOPY: Radiation Exposure Index (as provided by the fluoroscopic device): 42 mGy Kerma COMPLICATIONS: None immediate. PROCEDURE: Informed written consent was obtained from the patient after a thorough discussion of the procedural risks, benefits and alternatives. All questions were addressed. Maximal Sterile Barrier Technique was utilized including caps, mask, sterile gowns, sterile gloves, sterile drape, hand hygiene and skin antiseptic. A timeout was performed prior to the initiation of the procedure. In a supine position, the epigastric region was evaluated with ultrasound to delineate the lateral margin of the liver. The skin was marked. The patient was then prepped and draped in usual sterile fashion. Local anesthesia was achieved by infiltrating subcutaneous tissue to the level of the stomach with 1% lidocaine . Two small incisions were then made in the epigastric region. Through each incision and in serial fashion a gastropexy needle was advanced under fluoroscopic guidance where upon gastropexy suture was deployed. After deployment of 2 gastropexy sutures. A larger incision was then created. An 18 gauge needle was advanced through the larger incision into the stomach lumen. For all 3 access points intraluminal position was verified under fluoroscopy by tripping contrast onto the dorsal wall of the stomach and withdrawing gas into a syringe. An Amplatz wire was advanced through the needle and the needle was retrieved. A 10 mm x 8 cm balloon with the 18 French gastrostomy tube mounted on the shaft of the balloon was then advanced over the guidewire under fluoroscopic guidance until the radiopaque markers of the balloon spanned the access site in the ventral wall of the stomach. The balloon was then inflated thereby dilating the entire tract. The balloon was deflated as  the gastrostomy balloon was advanced over the shaft of the balloon and into the distal portion of the stomach. The balloon  and guidewire were retrieved after the retention balloon was inflated with approximately 10 mL of normal saline. Dilute contrast was then injected into the gastrostomy tube demonstrating satisfactory position within the stomach. The gastrostomy tube was then flushed with normal saline. Retention suture and sterile dressing applied. Prior to the beginning of the procedure a nasogastric tube was placed in order to infiltrate the stomach with room air. At the end of the procedure the nasogastric tube was retrieved. IMPRESSION: Satisfactory placement of an 22 French balloon retention gastrostomy tube as described above. Electronically Signed   By: [name]   On: 11/01/2023 15:09    Medications: I have reviewed the patient's current medications.  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  Non-hypermetabolic right middle lobe perifissural nodule below PET resolution and stable from prior, right subcarinal and low paratracheal lymph nodes suspicious for metastases, focal soft tissue uptake adjacent to the right ischium without CT correlate Concurrent chemoradiation starting 10/12/2023 Cycle 1 day 1 carboplatin /Taxol  10/14/2023 Cycle 1 day 8 carboplatin /Taxol  10/19/2023 Cycle 1 day 15 carboplatin /Taxol  10/26/2023 Cycle 1 day 22 carboplatin /Taxol  11/02/2023 COPD Chronic back pain with neuropathy (pre dates  chemo) Bipolar disorder Migraine headaches Left leg DVT in 2010 Fibromyalgia Arthritis of the knees Lymphedema of the legs 10.  History of tobacco and alcohol  use, none at present 11.   Alcoholism 12.  Solid dysphagia and anterior chest pain secondary to #1 13.  Odynophagia secondary to radiation: Supra fate added 10/26/2023  Disposition: Ms. Sarin appears stable.  She continues radiation.  She has completed 3 weekly treatment with carboplatin /Taxol .  Overall tolerating well.  Plan to proceed with week 4 today as scheduled.  CBC and chemistry panel reviewed.  Labs adequate for treatment.  She has mild hypokalemia.  Continue to monitor.  She now has a feeding tube.  She will begin feedings as previously instructed.  She is maintained on hydromorphone  2 mg 3 times daily for chronic pain.  Due to the cancer related chest pain she has had to take an additional tablet some days.  We sent a new prescription to her pharmacy for 1 extra tablet daily as needed.  She will return for follow-up and treatment in 1 week.  We are available to see her sooner if needed.    Olam Ned ANP/GNP-BC   11/02/2023  12:10 PM

## 2023-11-03 ENCOUNTER — Ambulatory Visit: Admitting: Dietician

## 2023-11-03 ENCOUNTER — Inpatient Hospital Stay

## 2023-11-03 ENCOUNTER — Ambulatory Visit

## 2023-11-03 ENCOUNTER — Other Ambulatory Visit: Payer: Self-pay

## 2023-11-03 ENCOUNTER — Ambulatory Visit
Admission: RE | Admit: 2023-11-03 | Discharge: 2023-11-03 | Disposition: A | Discharge status: Home / Self Care | Source: Ambulatory Visit | Attending: Radiation Oncology | Admitting: Radiation Oncology

## 2023-11-03 ENCOUNTER — Encounter: Payer: Self-pay | Admitting: Nurse Practitioner

## 2023-11-03 DIAGNOSIS — Z510 Encounter for antineoplastic radiation therapy: Secondary | ICD-10-CM | POA: Diagnosis not present

## 2023-11-03 DIAGNOSIS — C160 Malignant neoplasm of cardia: Secondary | ICD-10-CM | POA: Diagnosis not present

## 2023-11-03 DIAGNOSIS — C159 Malignant neoplasm of esophagus, unspecified: Secondary | ICD-10-CM | POA: Diagnosis not present

## 2023-11-03 LAB — RAD ONC ARIA SESSION SUMMARY
Course Elapsed Days: 22
Plan Fractions Treated to Date: 17
Plan Prescribed Dose Per Fraction: 1.8 Gy
Plan Total Fractions Prescribed: 25
Plan Total Prescribed Dose: 45 Gy
Reference Point Dosage Given to Date: 30.6 Gy
Reference Point Session Dosage Given: 1.8 Gy
Session Number: 17

## 2023-11-03 NOTE — Progress Notes (Signed)
 Nutrition  Called to follow-up with patient after having G-tube placed on 9/23.    Patient reports that she was in a lot of pain following tube placement on Tuesday evening.  Yesterday she was at chemotherapy and radiation all day and did not get to do a feeding.  This morning she attempted a water flush but it took her 40 minutes to get the water to flow through her tube.  She says she added the plunger to help push with water on through and it still took 40 minutes.  She said she tried to sit in a different position but nothing seemed to help.  She does not have a clamp on her tube but tubing is short.  She had only had a few sips of water before trying the water flush (belly was empty).  Says that she is using the middle port of the feeding tube.   Patient coming to [facility] this afternoon for radiation.  RD on site at [facility] will look at tube following radiation.  Patient knows to come to nutrition after radiation today.    Next follow-up: Monday, Sept 29 after radiation  Jaia Alonge B. Dasie SOLON, CSO, LDN Registered Dietitian [phone]

## 2023-11-03 NOTE — Progress Notes (Signed)
 Brief nutrition follow-up completed with patient and significant other after radiation. Patient contacted colleague this afternoon reporting difficulties flushing tube. Significant other brought syringe filled with water. Noted small tip medication adaptor attached to syringe. Reports using the plunger, but unable to push water through opening. RD inquired why additional adaptor was attached. Says it came in the box, so she thought that is how it went together.   RD removed medication adaptor, explaining this was not needed. Tube clamped with plastic hemostats. Syringe screwed into enfit feeding port. Fill syringe with water and released clamp. Tube flushed without difficulty. Significant other agreeable to provide additional water flush for practice. Tube flushed without difficulty. Patient and partner appreciative.   RD cleaned around tube and fresh split gauze applied. Additional mesh briefs and tape provided. Encouraged to contact with questions/concerns. Contact information provided.   Follow-up as scheduled - Monday September 29 with Joli

## 2023-11-04 ENCOUNTER — Ambulatory Visit

## 2023-11-04 ENCOUNTER — Ambulatory Visit
Admission: RE | Admit: 2023-11-04 | Discharge: 2023-11-04 | Disposition: A | Discharge status: Home / Self Care | Source: Ambulatory Visit | Attending: Radiation Oncology | Admitting: Radiation Oncology

## 2023-11-04 ENCOUNTER — Other Ambulatory Visit: Payer: Self-pay

## 2023-11-04 DIAGNOSIS — C159 Malignant neoplasm of esophagus, unspecified: Secondary | ICD-10-CM | POA: Diagnosis not present

## 2023-11-04 DIAGNOSIS — C160 Malignant neoplasm of cardia: Secondary | ICD-10-CM | POA: Diagnosis not present

## 2023-11-04 LAB — RAD ONC ARIA SESSION SUMMARY
Course Elapsed Days: 23
Plan Fractions Treated to Date: 18
Plan Prescribed Dose Per Fraction: 1.8 Gy
Plan Total Fractions Prescribed: 25
Plan Total Prescribed Dose: 45 Gy
Reference Point Dosage Given to Date: 32.4 Gy
Reference Point Session Dosage Given: 1.8 Gy
Session Number: 18

## 2023-11-06 ENCOUNTER — Other Ambulatory Visit: Payer: Self-pay | Admitting: Oncology

## 2023-11-07 ENCOUNTER — Inpatient Hospital Stay

## 2023-11-07 ENCOUNTER — Other Ambulatory Visit: Payer: Self-pay

## 2023-11-07 ENCOUNTER — Ambulatory Visit
Admission: RE | Admit: 2023-11-07 | Discharge: 2023-11-07 | Disposition: A | Source: Ambulatory Visit | Attending: Radiation Oncology

## 2023-11-07 DIAGNOSIS — C160 Malignant neoplasm of cardia: Secondary | ICD-10-CM | POA: Diagnosis not present

## 2023-11-07 DIAGNOSIS — Z510 Encounter for antineoplastic radiation therapy: Secondary | ICD-10-CM | POA: Diagnosis not present

## 2023-11-07 DIAGNOSIS — N1831 Chronic kidney disease, stage 3a: Secondary | ICD-10-CM | POA: Diagnosis not present

## 2023-11-07 DIAGNOSIS — C159 Malignant neoplasm of esophagus, unspecified: Secondary | ICD-10-CM | POA: Diagnosis not present

## 2023-11-07 DIAGNOSIS — R1310 Dysphagia, unspecified: Secondary | ICD-10-CM | POA: Diagnosis not present

## 2023-11-07 DIAGNOSIS — K5900 Constipation, unspecified: Secondary | ICD-10-CM | POA: Diagnosis not present

## 2023-11-07 DIAGNOSIS — Z6833 Body mass index (BMI) 33.0-33.9, adult: Secondary | ICD-10-CM | POA: Diagnosis not present

## 2023-11-07 DIAGNOSIS — I10 Essential (primary) hypertension: Secondary | ICD-10-CM | POA: Diagnosis not present

## 2023-11-07 DIAGNOSIS — I890 Lymphedema, not elsewhere classified: Secondary | ICD-10-CM | POA: Diagnosis not present

## 2023-11-07 DIAGNOSIS — F3130 Bipolar disorder, current episode depressed, mild or moderate severity, unspecified: Secondary | ICD-10-CM | POA: Diagnosis not present

## 2023-11-07 LAB — RAD ONC ARIA SESSION SUMMARY
Course Elapsed Days: 26
Plan Fractions Treated to Date: 19
Plan Prescribed Dose Per Fraction: 1.8 Gy
Plan Total Fractions Prescribed: 25
Plan Total Prescribed Dose: 45 Gy
Reference Point Dosage Given to Date: 34.2 Gy
Reference Point Session Dosage Given: 1.8 Gy
Session Number: 19

## 2023-11-07 NOTE — Progress Notes (Signed)
 Nutrition Follow-up:  Patient with gastroesophageal cancer.  Followed by Dr Cloretta.  Patient receiving concurrent radiation and chemotherapy (carboplatin  and taxol ).   Met with patient and caregiver, [name].   Patient reports that she was able to give 2 cartons of nutren 1.5 via feeding tube on Friday following chemotherapy on Wednesday.  Saturday she gave 1 carton of formula and had diarrhea.  Did not take imodium.  Sunday took 1 feeding and had diarrhea.  Patient reports that prior to getting the tube typically on Saturday after treatment on Wednesday would have diarrhea.     Medications: reviewed  Labs: K 3.3, glucose 127  Anthropometrics:   179 lb today  9/24 184 lb  187 lb on 9/15 193 lb on 9/10 (Dr Andriette office)    Estimated Energy Needs  Kcals: [phone] Protein: 85-102 g Fluid: > 1800 ml  NUTRITION DIAGNOSIS: Inadequate oral intake continues, relying on feeding tube   INTERVENTION:  Reviewed importance of giving feeding slowly.  1 carton should take 15 minutes to infusion through the tube.  All liquids should be room temperature.  Encouraged antidiarrheal medication.  Discussed with patient if imodium did not work then lomotil could be prescribed by medical oncology team.   Planning to get a vanilla milkshake today after radiation.      MONITORING, EVALUATION, GOAL: weight trends, tube feeding   NEXT VISIT: Monday, Oct 6 phone call  [name] [name], CSO, LDN Registered Dietitian [phone]

## 2023-11-08 ENCOUNTER — Ambulatory Visit

## 2023-11-08 ENCOUNTER — Telehealth: Payer: Self-pay | Admitting: *Deleted

## 2023-11-08 ENCOUNTER — Other Ambulatory Visit: Payer: Self-pay

## 2023-11-08 ENCOUNTER — Ambulatory Visit
Admission: RE | Admit: 2023-11-08 | Discharge: 2023-11-08 | Disposition: A | Source: Ambulatory Visit | Attending: Radiation Oncology

## 2023-11-08 DIAGNOSIS — E785 Hyperlipidemia, unspecified: Secondary | ICD-10-CM | POA: Diagnosis not present

## 2023-11-08 DIAGNOSIS — I10 Essential (primary) hypertension: Secondary | ICD-10-CM | POA: Diagnosis not present

## 2023-11-08 DIAGNOSIS — C160 Malignant neoplasm of cardia: Secondary | ICD-10-CM | POA: Diagnosis not present

## 2023-11-08 DIAGNOSIS — F3130 Bipolar disorder, current episode depressed, mild or moderate severity, unspecified: Secondary | ICD-10-CM | POA: Diagnosis not present

## 2023-11-08 LAB — RAD ONC ARIA SESSION SUMMARY
Course Elapsed Days: 27
Plan Fractions Treated to Date: 20
Plan Prescribed Dose Per Fraction: 1.8 Gy
Plan Total Fractions Prescribed: 25
Plan Total Prescribed Dose: 45 Gy
Reference Point Dosage Given to Date: 36 Gy
Reference Point Session Dosage Given: 1.8 Gy
Session Number: 20

## 2023-11-08 NOTE — Telephone Encounter (Signed)
 LVM with Pain [facility] voice mail requesting provider alter her hydromorphone  to include an extra dose daily to help manage her cancer pain in esophagus from RT/chemo. Insurance will not approve oncology putting in extra script for 2 mg daily and provider not comfortable with a 4 mg extra dose daily, so declines to order. Left phone # for physicians line for return call if necessary.

## 2023-11-08 NOTE — Progress Notes (Unsigned)
 [facility]   [phone] [phone]    Patient Care Team: Stamey, Chiquita POUR, FNP as PCP - General (Family Medicine) Nori Sari SQUIBB, RN as Oncology Nurse Navigator   CHIEF COMPLAINT: Follow up gastroesophageal cancer   CURRENT THERAPY: Concurrent chemoradiation with weekly carbo/taxol  starting 10/14/23  INTERVAL HISTORY [name] returns for follow up, she continues concurrent chemoRT. She's very tired. She had a low grade fever 100.3 and diarrhea over the weekend. She eats imodium which does not help much. Tried to eat a french fry yesterday that burned going down, and limiting tube feeds due to diarrhea. Painful swallowing gets up to 10/10. Taking carafate  and dilaudid  which helps. 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   ROS  All other systems reviewed and negative   Past Medical History:  Diagnosis Date   Anemia    Anesthesia complication    woke up during back surgery at [facility] 2020   Anxiety    Bipolar affective disorder (HCC)    Cancer (HCC)    skin cancer   Chronic kidney disease    stage 3 kidney disease   COPD (chronic obstructive pulmonary disease) (HCC)    Cough    Depression    DJD (degenerative joint disease) of lumbar spine    DVT (deep venous thrombosis) (HCC) 2010   groin left - on coumadin for 6 months   Family history of anesthesia complication    Hx: father has nausea   Fibromyalgia    GERD (gastroesophageal reflux disease)    Left leg DVT (HCC) 2010   Lymphedema 2019   Migraine    Peripheral vascular disease    Pneumonia    PONV (postoperative nausea and vomiting)    and migraines   Pre-diabetes    Shortness of breath    exertion   Spinal cord stimulator dysfunction    has one in,but not working   Spinal headache    Vertigo    Hx: of   Vocal cord dysfunction    sees dr. shelah     Past Surgical History:  Procedure Laterality Date   ABDOMINAL HYSTERECTOMY  1980   partial    ANTERIOR  LAT LUMBAR FUSION Left 02/15/2014   Procedure: ANTERIOR LATERAL LUMBAR INTERBODY FUSION LUMBAR TWO-THREE,LUMBAR THREE-FOUR WITH LATERAL PLATE.;  Surgeon: Victory DELENA Gunnels, MD;  Location: [facility];  Service: Neurosurgery;  Laterality: Left;  left    APPENDECTOMY     BACK SURGERY     BLEPHAROPLASTY     bilateral   BREAST ENHANCEMENT SURGERY  1989   CARPAL TUNNEL RELEASE  2006   Right hand   CATARACT EXTRACTION W/ INTRAOCULAR LENS  IMPLANT, BILATERAL     Hx: of   CERVICAL FUSION  2005   CHOLECYSTECTOMY  1989   COLONOSCOPY W/ BIOPSIES AND POLYPECTOMY     Hx: of   cyst removed from right thumb      DENTAL RESTORATION/EXTRACTION WITH X-RAY     16 teeth removed due to osteonecrosis due to fosamax   ESOPHAGOGASTRODUODENOSCOPY N/A 09/13/2023   Procedure: EGD (ESOPHAGOGASTRODUODENOSCOPY);  Surgeon: Rollin Dover, MD;  Location: [facility];  Service: Gastroenterology;  Laterality: N/A;   eyebrow and eyelid lift      2024   FINGER ARTHROSCOPY WITH CARPOMETACARPEL ([facility]) ARTHROPLASTY     thumb   FRACTURE SURGERY Right 09/2013   ankle and leg - plate and screws   Fusion of third joint of index  finger on right hand      IR GASTROSTOMY TUBE MOD SED  11/01/2023   lipoma removed right elbow      lumbar arthrodesis      LUMBAR LAMINECTOMY/DECOMPRESSION MICRODISCECTOMY Right 11/06/2012   Procedure: LUMBAR TWO THREE LUMBAR LAMINECTOMY/DECOMPRESSION MICRODISCECTOMY 1 LEVEL;  Surgeon: Victory DELENA Gunnels, MD;  Location: [facility];  Service: Neurosurgery;  Laterality: Right;   OPEN REDUCTION INTERNAL FIXATION (ORIF) DISTAL RADIAL FRACTURE Right 05/05/2018   Procedure: OPEN REDUCTION INTERNAL FIXATION (ORIF) DISTAL RADIAL FRACTURE;  Surgeon: Sissy Cough, MD;  Location: [facility];  Service: Orthopedics;  Laterality: Right;   ORIF ANKLE FRACTURE Right 2015   ORIF TIBIA FRACTURE Right 2015   PARTIAL HYSTERECTOMY  1980   recurrent right ankle surgery      right ankle surgery      right  wrist surgery      with plates and screws   ROTATOR CUFF REPAIR Right 2017   SHOULDER ARTHROSCOPY     SPINAL CORD STIMULATOR BATTERY EXCHANGE Right 09/09/2017   Procedure: SPINAL CORD STIMULATOR BATTERY EXCHANGE;  Surgeon: Mindi Mt, MD;  Location: [facility];  Service: Neurosurgery;  Laterality: Right;  SPINAL CORD STIMULATOR BATTERY EXCHANGE   SPINAL CORD STIMULATOR IMPLANT  2010   SPINAL FUSION  2018   L1, L2   TARSAL SUSPENSION     Hx; of left thumb   TONSILLECTOMY  1955   UPPER GI ENDOSCOPY     Hx: of   WISDOM TOOTH EXTRACTION       Outpatient Encounter Medications as of 11/09/2023  Medication Sig Note   ALPRAZolam  (XANAX ) 1 MG tablet Take 1 mg by mouth See admin instructions. Take 1 tablet (1 mg) by mouth scheduled twice daily, may take an additional dose in the afternoon, if needed for anxiety.    ARIPiprazole  (ABILIFY ) 30 MG tablet Take 30 mg by mouth at bedtime.    butalbital -acetaminophen -caffeine  (FIORICET , ESGIC ) 50-325-40 MG per tablet Take 2 tablets by mouth every 4 (four) hours as needed for headache or migraine.     chlorpheniramine (CHLOR-TRIMETON) 4 MG tablet Take 8 mg by mouth at bedtime.    cyclobenzaprine  (FLEXERIL ) 10 MG tablet Take 10 mg by mouth at bedtime.    cycloSPORINE  (RESTASIS ) 0.05 % ophthalmic emulsion Place 1 drop into both eyes 2 (two) times daily.    dexlansoprazole  (DEXILANT ) 60 MG capsule Take 1 capsule (60 mg total) by mouth daily.    diphenoxylate-atropine (LOMOTIL) 2.5-0.025 MG tablet Take 1 tablet by mouth 4 (four) times daily as needed for diarrhea or loose stools.    ezetimibe  (ZETIA ) 10 MG tablet Take 10 mg by mouth at bedtime.    fenofibrate (TRICOR) 145 MG tablet Take 145 mg by mouth in the morning.    fluticasone  (FLONASE ) 50 MCG/ACT nasal spray Place 2 sprays into both nostrils 2 (two) times daily.    HYDROmorphone  (DILAUDID ) 2 MG tablet Take 2 mg by mouth in the morning, at noon, and at bedtime.    HYDROmorphone  (DILAUDID ) 2 MG tablet Take  1 tablet (2 mg total) by mouth daily as needed for severe pain (pain score 7-10). May take 1 additional tablet daily as needed    lamoTRIgine  (LAMICTAL ) 200 MG tablet Take 200 mg by mouth at bedtime.    linaclotide (LINZESS) 290 MCG CAPS capsule Take 290 mcg by mouth daily before breakfast.    lisdexamfetamine (VYVANSE ) 50 MG capsule Take 50 mg by mouth daily after breakfast.  loperamide (IMODIUM A-D) 2 MG tablet Take 2-4 mg by mouth 4 (four) times daily as needed for diarrhea or loose stools.    meclizine  (ANTIVERT ) 25 MG tablet Take 25 mg by mouth 3 (three) times daily as needed for dizziness.     Melatonin 12 MG TABS Take 12 mg by mouth at bedtime.    Nutritional Supplements (NUTREN 1.5) LIQD Give 1 carton, 5 times a day with bolus syringe.  Flush with 60ml of water before and after each feeding. Give additional 240ml of water 2 times during the day for additional hydration.    ondansetron  (ZOFRAN ) 8 MG tablet Take 1 tablet (8 mg total) by mouth every 8 (eight) hours as needed. May start taking 72 hours after each chemotherapy treatment    Polyethyl Glycol-Propyl Glycol (SYSTANE OP) Place 1 drop into both eyes 3 (three) times daily as needed (dry/irritated eyes.).    potassium chloride 20 MEQ/15ML (10%) SOLN Take 15 mLs (20 mEq total) by mouth 2 (two) times daily. Per tube    pregabalin  (LYRICA ) 300 MG capsule Take 300 mg by mouth in the morning and at bedtime.    prochlorperazine  (COMPAZINE ) 5 MG tablet Take 1 tablet (5 mg total) by mouth every 6 (six) hours as needed for nausea or vomiting.    sucralfate  (CARAFATE ) 1 GM/10ML suspension Take 10 mLs (1 g total) by mouth 4 (four) times daily.    torsemide (DEMADEX) 20 MG tablet Take 20 mg by mouth 4 (four) times daily as needed (fluid retention.). 10/26/2023: Averages 1/day while on RT   No facility-administered encounter medications on file as of 11/09/2023.     Today's Vitals   11/09/23 1100 11/09/23 1108 11/09/23 1201  BP:  105/62   Pulse:   76   Resp:  18   Temp:  97.7 F (36.5 C)   TempSrc:  Temporal   SpO2:  91% 93%  Weight:  181 lb 4.8 oz (82.2 kg)   Height:  5' 1 (1.549 m)   PainSc: 5      Body mass index is 34.26 kg/m.   ECOG PERFORMANCE STATUS: 1 - Symptomatic but completely ambulatory  PHYSICAL EXAM GENERAL:alert, no distress and comfortable SKIN: rash to central back    EYES: sclera clear LUNGS: clear with normal breathing effort HEART: regular rate & rhythm ABDOMEN: abdomen soft, non-tender and normal bowel sounds NEURO: alert & oriented x 3 with fluent speech   CBC    Latest Ref Rng & Units 11/09/2023   10:12 AM 11/02/2023   11:20 AM 11/01/2023    9:17 AM  CBC  WBC 4.0 - 10.5 K/uL 5.4  5.7  3.5   Hemoglobin 12.0 - 15.0 g/dL 88.5  88.7  88.4   Hematocrit 36.0 - 46.0 % 32.5  32.8  33.6   Platelets 150 - 400 K/uL 217  243  260       CMP     Latest Ref Rng & Units 11/09/2023   10:12 AM 11/02/2023   11:20 AM 10/26/2023   11:02 AM  CMP  Glucose 70 - 99 mg/dL 893  872  897   BUN 8 - 23 mg/dL 10  16  9    Creatinine 0.44 - 1.00 mg/dL 9.19  9.42  9.26   Sodium 135 - 145 mmol/L 135  136  142   Potassium 3.5 - 5.1 mmol/L 3.0  3.3  3.8   Chloride 98 - 111 mmol/L 94  100  105   CO2  22 - 32 mmol/L 26  22  26    Calcium  8.9 - 10.3 mg/dL 9.2  9.4  9.6   Total Protein 6.5 - 8.1 g/dL 6.3  6.5  6.7   Total Bilirubin 0.0 - 1.2 mg/dL 0.3  0.4  0.2   Alkaline Phos 38 - 126 U/L 58  59  68   AST 15 - 41 U/L 16  12  16    ALT 0 - 44 U/L 6  6  5        ASSESSMENT & PLAN: 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 upper endoscopy stenosis at the GE junction with abnormal appearance of the GE junction and gastric cardia-biopsy: Moderately differentiated adenocarcinoma, mismatch repair protein expression intact, HER2 negative (0), PD-L1 CPS 20 09/08/2023 esophagram dilation of the distal esophagus with severe narrowing of the GE junction consistent with achalasia 09/15/2023 CTs: Circumferential thickening of the distal  esophagus at the GE junction, borderline enlarged mediastinal nodes increased from 05/04/2023 09/20/2023 PET: Hypermetabolic gastroesophageal junction mass, non-FDG avid right middle lobe.  Non-hypermetabolic right middle lobe perifissural nodule below PET resolution and stable from prior, right subcarinal and low paratracheal lymph nodes suspicious for metastases, focal soft tissue uptake adjacent to the right ischium without CT correlate Concurrent chemoradiation starting 10/12/2023 Cycle 1 day 1 carboplatin /Taxol  10/14/2023 Cycle 1 day 8 carboplatin /Taxol  10/19/2023 Cycle 1 day 15 carboplatin /Taxol  10/26/2023 Cycle 1 day 22 carboplatin /Taxol  11/02/2023 Cycle 1 day 29 carboplatin /Taxol  11/09/2023 COPD 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  History of tobacco and alcohol  use, none at present 11.   Alcoholism 12.  Solid dysphagia and anterior chest pain secondary to #1 13.  Odynophagia secondary to radiation: Supra fate added 10/26/2023 14. Hypokalemia secondary to GI loss and diuretic     Disposition:  Ms. Labo appears stable, s/p week 4 ccRT with carbo/taxol , tolerating well overall. Main side effects are fatigue, radiation esophagitis, and diarrhea (likely from tube feeds). I prescribed lomotil; an allergy to scopolamine  causes vertigo, she will try lomotil with caution. She is able to recover and function with adequate performance status.   Labs reviewed, K 3.0 secondary to GI loss and diuretic, will receive 10 mEq KCL x1 in clinic then start 20 mEq suspension BID per tube this evening.   Proceed with week 5 Carbo/taxol  today, no dose adjustments, and continue daily RT.   She will return for lab, follow up, and final cycle 6 Carbo/taxol  next week as scheduled, or sooner if needed.     All questions were answered. The patient knows to call the clinic with  any problems, questions or concerns. No barriers to learning were detected.   Catia Todorov K Efe Fazzino, NP 11/09/2023

## 2023-11-09 ENCOUNTER — Inpatient Hospital Stay: Admitting: Nurse Practitioner

## 2023-11-09 ENCOUNTER — Encounter: Payer: Self-pay | Admitting: Nurse Practitioner

## 2023-11-09 ENCOUNTER — Ambulatory Visit
Admission: RE | Admit: 2023-11-09 | Discharge: 2023-11-09 | Disposition: A | Discharge status: Home / Self Care | Source: Ambulatory Visit | Attending: Radiation Oncology | Admitting: Radiation Oncology

## 2023-11-09 ENCOUNTER — Inpatient Hospital Stay

## 2023-11-09 ENCOUNTER — Other Ambulatory Visit: Payer: Self-pay

## 2023-11-09 ENCOUNTER — Ambulatory Visit

## 2023-11-09 ENCOUNTER — Other Ambulatory Visit

## 2023-11-09 VITALS — BP 128/77 | HR 76 | Temp 97.3°F | Resp 18

## 2023-11-09 VITALS — BP 105/62 | HR 76 | Temp 97.7°F | Resp 18 | Ht 61.0 in | Wt 181.3 lb

## 2023-11-09 DIAGNOSIS — E876 Hypokalemia: Secondary | ICD-10-CM

## 2023-11-09 DIAGNOSIS — C160 Malignant neoplasm of cardia: Secondary | ICD-10-CM | POA: Insufficient documentation

## 2023-11-09 DIAGNOSIS — Z510 Encounter for antineoplastic radiation therapy: Secondary | ICD-10-CM | POA: Diagnosis not present

## 2023-11-09 DIAGNOSIS — Z5111 Encounter for antineoplastic chemotherapy: Secondary | ICD-10-CM | POA: Insufficient documentation

## 2023-11-09 DIAGNOSIS — Z23 Encounter for immunization: Secondary | ICD-10-CM | POA: Diagnosis not present

## 2023-11-09 DIAGNOSIS — C159 Malignant neoplasm of esophagus, unspecified: Secondary | ICD-10-CM | POA: Diagnosis not present

## 2023-11-09 LAB — CMP (CANCER CENTER ONLY)
ALT: 6 U/L (ref 0–44)
AST: 16 U/L (ref 15–41)
Albumin: 3.3 g/dL — ABNORMAL LOW (ref 3.5–5.0)
Alkaline Phosphatase: 58 U/L (ref 38–126)
Anion gap: 15 (ref 5–15)
BUN: 10 mg/dL (ref 8–23)
CO2: 26 mmol/L (ref 22–32)
Calcium: 9.2 mg/dL (ref 8.9–10.3)
Chloride: 94 mmol/L — ABNORMAL LOW (ref 98–111)
Creatinine: 0.8 mg/dL (ref 0.44–1.00)
GFR, Estimated: >60 mL/min (ref >60)
Glucose, Bld: 106 mg/dL — ABNORMAL HIGH (ref 70–99)
Potassium: 3 mmol/L — ABNORMAL LOW (ref 3.5–5.1)
Sodium: 135 mmol/L (ref 135–145)
Total Bilirubin: 0.3 mg/dL (ref 0.0–1.2)
Total Protein: 6.3 g/dL — ABNORMAL LOW (ref 6.5–8.1)

## 2023-11-09 LAB — CBC WITH DIFFERENTIAL (CANCER CENTER ONLY)
Abs Immature Granulocytes: 0.06 K/uL (ref 0.00–0.07)
Basophils Absolute: 0 K/uL (ref 0.0–0.1)
Basophils Relative: 1 %
Eosinophils Absolute: 0.1 K/uL (ref 0.0–0.5)
Eosinophils Relative: 2 %
HCT: 32.5 % — ABNORMAL LOW (ref 36.0–46.0)
Hemoglobin: 11.4 g/dL — ABNORMAL LOW (ref 12.0–15.0)
Immature Granulocytes: 1 %
Lymphocytes Relative: 2 %
Lymphs Abs: 0.1 K/uL — ABNORMAL LOW (ref 0.7–4.0)
MCH: 30.2 pg (ref 26.0–34.0)
MCHC: 35.1 g/dL (ref 30.0–36.0)
MCV: 86 fL (ref 80.0–100.0)
Monocytes Absolute: 0.5 K/uL (ref 0.1–1.0)
Monocytes Relative: 10 %
Neutro Abs: 4.6 K/uL (ref 1.7–7.7)
Neutrophils Relative %: 84 %
Platelet Count: 217 K/uL (ref 150–400)
RBC: 3.78 MIL/uL — ABNORMAL LOW (ref 3.87–5.11)
RDW: 14.3 % (ref 11.5–15.5)
WBC Count: 5.4 K/uL (ref 4.0–10.5)
nRBC: 0 % (ref 0.0–0.2)

## 2023-11-09 LAB — RAD ONC ARIA SESSION SUMMARY
Course Elapsed Days: 28
Plan Fractions Treated to Date: 21
Plan Prescribed Dose Per Fraction: 1.8 Gy
Plan Total Fractions Prescribed: 25
Plan Total Prescribed Dose: 45 Gy
Reference Point Dosage Given to Date: 37.8 Gy
Reference Point Session Dosage Given: 1.8 Gy
Session Number: 21

## 2023-11-09 MED ORDER — PALONOSETRON HCL INJECTION 0.25 MG/5ML
0.2500 mg | Freq: Once | INTRAVENOUS | Status: AC
  Administered 2023-11-09: 0.25 mg via INTRAVENOUS
  Filled 2023-11-09: qty 5

## 2023-11-09 MED ORDER — DIPHENOXYLATE-ATROPINE 2.5-0.025 MG PO TABS: 1.0000 | ORAL_TABLET | Freq: Four times a day (QID) | ORAL | 0 refills | Status: DC | PRN

## 2023-11-09 MED ORDER — DEXAMETHASONE SODIUM PHOSPHATE 10 MG/ML IJ SOLN
10.0000 mg | Freq: Once | INTRAMUSCULAR | Status: AC
  Administered 2023-11-09: 10 mg via INTRAVENOUS
  Filled 2023-11-09: qty 1

## 2023-11-09 MED ORDER — POTASSIUM CHLORIDE 10 MEQ/100ML IV SOLN
10.0000 meq | Freq: Once | INTRAVENOUS | Status: AC
  Administered 2023-11-09: 10 meq via INTRAVENOUS
  Filled 2023-11-09: qty 100

## 2023-11-09 MED ORDER — SODIUM CHLORIDE 0.9 % IV SOLN
50.0000 mg/m2 | Freq: Once | INTRAVENOUS | Status: AC
  Administered 2023-11-09: 96 mg via INTRAVENOUS
  Filled 2023-11-09: qty 16

## 2023-11-09 MED ORDER — DIPHENHYDRAMINE HCL 50 MG/ML IJ SOLN
25.0000 mg | Freq: Once | INTRAMUSCULAR | Status: AC
  Administered 2023-11-09: 25 mg via INTRAVENOUS
  Filled 2023-11-09: qty 1

## 2023-11-09 MED ORDER — FAMOTIDINE IN NACL 20-0.9 MG/50ML-% IV SOLN
20.0000 mg | Freq: Once | INTRAVENOUS | Status: AC
  Administered 2023-11-09: 20 mg via INTRAVENOUS
  Filled 2023-11-09: qty 50

## 2023-11-09 MED ORDER — SODIUM CHLORIDE 0.9 % IV SOLN
175.8000 mg | Freq: Once | INTRAVENOUS | Status: AC
  Administered 2023-11-09: 180 mg via INTRAVENOUS
  Filled 2023-11-09: qty 18

## 2023-11-09 MED ORDER — SODIUM CHLORIDE 0.9 % IV SOLN: INTRAVENOUS | Status: DC

## 2023-11-09 MED ORDER — POTASSIUM CHLORIDE 20 MEQ/15ML (10%) PO SOLN: 20.0000 meq | Freq: Two times a day (BID) | ORAL | 0 refills | Status: AC

## 2023-11-09 NOTE — Progress Notes (Signed)
.  Patient seen by Lacie Burton, NP today  Vitals are within treatment parameters:Yes   Labs are within treatment parameters: Yes potassium 3.0. Patient will received 10 Meq of IV potassium  Treatment plan has been signed: Yes   Per physician team, Patient is ready for treatment and there are NO modifications to the treatment plan.

## 2023-11-09 NOTE — Patient Instructions (Signed)
 [facility] - A DEPT OF [facility]  Discharge Instructions: Thank you for choosing [facility] to provide your oncology and hematology care.   If you have a lab appointment with the Cancer Center, please go directly to the Cancer Center and check in at the registration area.   Wear comfortable clothing and clothing appropriate for easy access to any Portacath or PICC line.   We strive to give you quality time with your provider. You may need to reschedule your appointment if you arrive late (15 or more minutes).  Arriving late affects you and other patients whose appointments are after yours.  Also, if you miss three or more appointments without notifying the office, you may be dismissed from the clinic at the provider's discretion.      For prescription refill requests, have your pharmacy contact our office and allow 72 hours for refills to be completed.    Today you received the following chemotherapy and/or immunotherapy agents: paclitaxel and carboplatin      To help prevent nausea and vomiting after your treatment, we encourage you to take your nausea medication as directed.  BELOW ARE SYMPTOMS THAT SHOULD BE REPORTED IMMEDIATELY: *FEVER GREATER THAN 100.4 F (38 C) OR HIGHER *CHILLS OR SWEATING *NAUSEA AND VOMITING THAT IS NOT CONTROLLED WITH YOUR NAUSEA MEDICATION *UNUSUAL SHORTNESS OF BREATH *UNUSUAL BRUISING OR BLEEDING *URINARY PROBLEMS (pain or burning when urinating, or frequent urination) *BOWEL PROBLEMS (unusual diarrhea, constipation, pain near the anus) TENDERNESS IN MOUTH AND THROAT WITH OR WITHOUT PRESENCE OF ULCERS (sore throat, sores in mouth, or a toothache) UNUSUAL RASH, SWELLING OR PAIN  UNUSUAL VAGINAL DISCHARGE OR ITCHING   Items with * indicate a potential emergency and should be followed up as soon as possible or go to the Emergency Department if any problems should occur.  Please show the CHEMOTHERAPY ALERT CARD  or IMMUNOTHERAPY ALERT CARD at check-in to the Emergency Department and triage nurse.  Should you have questions after your visit or need to cancel or reschedule your appointment, please contact [facility]  Dept: [phone]  and follow the prompts.  Office hours are 8:00 a.m. to 4:30 p.m. Monday - Friday. Please note that voicemails left after 4:00 p.m. may not be returned until the following business day.  We are closed weekends and major holidays. You have access to a nurse at all times for urgent questions. Please call the main number to the clinic Dept: [phone] and follow the prompts.   For any non-urgent questions, you may also contact your provider using MyChart. We now offer e-Visits for anyone 57 and older to request care online for non-urgent symptoms. For details visit [email or web address].   Also download the MyChart app! Go to the app store, search "MyChart", open the app, select [facility], and log in with your MyChart username and password.

## 2023-11-10 ENCOUNTER — Ambulatory Visit
Admission: RE | Admit: 2023-11-10 | Discharge: 2023-11-10 | Disposition: A | Discharge status: Home / Self Care | Source: Ambulatory Visit | Attending: Radiation Oncology | Admitting: Radiation Oncology

## 2023-11-10 ENCOUNTER — Other Ambulatory Visit: Payer: Self-pay

## 2023-11-10 ENCOUNTER — Ambulatory Visit

## 2023-11-10 DIAGNOSIS — C159 Malignant neoplasm of esophagus, unspecified: Secondary | ICD-10-CM | POA: Diagnosis not present

## 2023-11-10 DIAGNOSIS — C160 Malignant neoplasm of cardia: Secondary | ICD-10-CM | POA: Diagnosis not present

## 2023-11-10 DIAGNOSIS — Z510 Encounter for antineoplastic radiation therapy: Secondary | ICD-10-CM | POA: Diagnosis not present

## 2023-11-10 LAB — RAD ONC ARIA SESSION SUMMARY
Course Elapsed Days: 29
Plan Fractions Treated to Date: 22
Plan Prescribed Dose Per Fraction: 1.8 Gy
Plan Total Fractions Prescribed: 25
Plan Total Prescribed Dose: 45 Gy
Reference Point Dosage Given to Date: 39.6 Gy
Reference Point Session Dosage Given: 1.8 Gy
Session Number: 22

## 2023-11-11 ENCOUNTER — Ambulatory Visit
Admission: RE | Admit: 2023-11-11 | Discharge: 2023-11-11 | Disposition: A | Discharge status: Home / Self Care | Source: Ambulatory Visit | Attending: Radiation Oncology | Admitting: Radiation Oncology

## 2023-11-11 ENCOUNTER — Ambulatory Visit

## 2023-11-11 ENCOUNTER — Other Ambulatory Visit: Payer: Self-pay

## 2023-11-11 DIAGNOSIS — C160 Malignant neoplasm of cardia: Secondary | ICD-10-CM | POA: Diagnosis not present

## 2023-11-11 DIAGNOSIS — Z510 Encounter for antineoplastic radiation therapy: Secondary | ICD-10-CM | POA: Diagnosis not present

## 2023-11-11 DIAGNOSIS — Z23 Encounter for immunization: Secondary | ICD-10-CM | POA: Diagnosis not present

## 2023-11-11 DIAGNOSIS — C159 Malignant neoplasm of esophagus, unspecified: Secondary | ICD-10-CM | POA: Diagnosis not present

## 2023-11-11 LAB — RAD ONC ARIA SESSION SUMMARY
Course Elapsed Days: 30
Plan Fractions Treated to Date: 23
Plan Prescribed Dose Per Fraction: 1.8 Gy
Plan Total Fractions Prescribed: 25
Plan Total Prescribed Dose: 45 Gy
Reference Point Dosage Given to Date: 41.4 Gy
Reference Point Session Dosage Given: 1.8 Gy
Session Number: 23

## 2023-11-12 ENCOUNTER — Other Ambulatory Visit: Payer: Self-pay | Admitting: Oncology

## 2023-11-12 DIAGNOSIS — C160 Malignant neoplasm of cardia: Secondary | ICD-10-CM

## 2023-11-14 ENCOUNTER — Inpatient Hospital Stay

## 2023-11-14 ENCOUNTER — Telehealth: Payer: Self-pay

## 2023-11-14 ENCOUNTER — Inpatient Hospital Stay: Admitting: Physician Assistant

## 2023-11-14 ENCOUNTER — Other Ambulatory Visit: Payer: Self-pay

## 2023-11-14 ENCOUNTER — Ambulatory Visit
Admission: RE | Admit: 2023-11-14 | Discharge: 2023-11-14 | Disposition: A | Discharge status: Home / Self Care | Source: Ambulatory Visit | Attending: Radiation Oncology | Admitting: Radiation Oncology

## 2023-11-14 VITALS — BP 117/68 | HR 88 | Temp 97.9°F | Resp 20 | Wt 178.2 lb

## 2023-11-14 DIAGNOSIS — K9429 Other complications of gastrostomy: Secondary | ICD-10-CM | POA: Diagnosis not present

## 2023-11-14 DIAGNOSIS — C160 Malignant neoplasm of cardia: Secondary | ICD-10-CM

## 2023-11-14 LAB — RAD ONC ARIA SESSION SUMMARY
Course Elapsed Days: 33
Plan Fractions Treated to Date: 24
Plan Prescribed Dose Per Fraction: 1.8 Gy
Plan Total Fractions Prescribed: 25
Plan Total Prescribed Dose: 45 Gy
Reference Point Dosage Given to Date: 43.2 Gy
Reference Point Session Dosage Given: 1.8 Gy
Session Number: 24

## 2023-11-14 NOTE — Progress Notes (Addendum)
 Nutrition Follow-up:  Patient with gastroesophageal cancer.  Followed by Dr Cloretta.  Receiving concurrent radiation and chemotherapy (carboplatin  and taxol ).   Spoke with patient via phone. Reports that she has been taking lomotil 4 times a day.  Had diarrhea this am on pad in bed.  Usually goes 1-2 times a day with lomotil.  Increased diarrhea if does not take lomotil.  Says that she is working up to 3 feedings a day.  Feels fatigued.  Reports a yellowish, greenish discharge around feeding tube that she is concerned about.  Says that she had a low grade fever (99 degrees) on Friday, 10/3.     Medications: reviewed  Labs: K 3.0  Anthropometrics:   Weight 181 lb 4.8 oz on 10/1 179 lb on 9/29 184 lb on 9/24 187 lb on 9/15 193 lb on 9/10 (Dr sherrill's office)   Estimated Energy Needs  Kcals: 8199-7874 Protein: 85-102 g Fluid: > 1800 ml  NUTRITION DIAGNOSIS: Inadequate oral intake continues, relying on feeding tube   INTERVENTION:  Message sent to PA with [facility] regarding discharge around tube and able to see patient today at 2:45pm prior to radiation at 3:30pm.  Patient informed and she will be here.   Continue lomotil for diarrhea Sample of [place] 1.5 peptide given to patient to try and see if diarrhea improves. Samples left with Elenor, RN in [facility] for patient    MONITORING, EVALUATION, GOAL: weight trends, intake, tube feeding   NEXT VISIT: Monday, Oct 13 phone call  [name] [name], CSO, LDN Registered Dietitian [phone]

## 2023-11-14 NOTE — Telephone Encounter (Signed)
 The patient contacted us  over the weekend regarding the skin around her feeding tube. She reported the presence of some pus in the area, with no foul odor or redness. She is scheduled to visit interventional radiology today and will have a healthcare professional assess the site. She will follow up with us  with any updates or recommendations from the provider. I advised her to clean the area with normal saline and to apply gauze as needed.

## 2023-11-14 NOTE — Progress Notes (Signed)
 Symptom Management Consult Note [facility]    Patient Care Team: Stamey, Chiquita POUR, FNP as PCP - General (Family Medicine) Nori, Sari SQUIBB, RN as Oncology Nurse Navigator    Name / MRN / DOB: [name]  [id number]  05/16/1945   Date of visit: 11/14/2023   Chief Complaint/Reason for visit: PEG tube drainage   Current Therapy: Concurrent chemoradiation with weekly carbo/taxol  starting 10/14/23   Last treatment:  Day 29   Cycle 1 on 11/09/23    ASSESSMENT AND PLAN Patient is a 78 y.o. female with oncologic history of gastroesophageal cancer followed by Dr. Cloretta.  I have viewed most recent oncology note and lab work.  #Gastroesophageal cancer  - Next appointment with oncologist is 11/16/23   #PEG tube - Chronic drainage and irritation at gastrostomy site without systemic infection signs, pain due to pressure. - Switch to Dial antibacterial foaming soap, was previously using dawn dish soap. - Monitor for signs of infection such as fever, increased drainage, or dressing saturation. Encouraged to try repositioning in the recliner or move around to alleviate pressure-related pain she experiences in the evening. - Recommend reaching out to IR service if any issues with tube function in the future.   Strict ED precautions discussed should symptoms worsen.   HEME/ONC HISTORY Oncology History  Malignant neoplasm of gastroesophageal junction (HCC)  09/23/2023 Initial Diagnosis   Malignant neoplasm of gastroesophageal junction (HCC)   09/23/2023 Cancer Staging   Staging form: Esophagus - Adenocarcinoma, AJCC 8th Edition - Clinical: Stage Unknown (cTX, cN1, cM0, G2) - Signed by [name], MD on 09/30/2023 Stage prefix: Initial diagnosis Histologic grading system: 3 grade system   10/14/2023 -  Chemotherapy   Patient is on Treatment Plan : ESOPHAGUS Carboplatin  + Paclitaxel  Weekly X 6 Weeks with XRT         INTERVAL HISTORY  Discussed the use of  AI scribe software for clinical note transcription with the patient, who gave verbal consent to proceed.    [name] is a 78 y.o. female with oncologic history as above presenting to [facility] today with chief complaint of peg tube drainage. Accompanied to clinic today by spouse who provides additional history.  She has been experiencing yellowish-green purulent drainage from her feeding tube site since its placement. The drainage does not occur when applying pressure, but she notes there is more of it present on the dressing when she does her first dressing change of the day. She cleans the site with dawn dish soap and water using a Q-tip.  No fevers or chills have been noted. She is concerned about potential infection while on treatment.  Currently undergoing chemotherapy, she experiences significant diarrhea, particularly two days post-treatment. The diarrhea is severe, described as 'taking a bucket of diarrhea and water and pouring it all over the floor.' Despite using Lomotil and simethicone, the diarrhea persists, though she can now reach the toilet in time, except for one incident during the night. She discussed this is more detail with the dietician today during their phone visit. Patient reports tube is functioning without difficulty. She has pain at the tube site is noted, particularly at night while sitting in a recliner. Pain has been ongoing since tube was placed as well. No fevers, chills, or general malaise.        ROS  All other systems are reviewed and are negative for acute change except as noted in the HPI.    Allergies  Allergen  Reactions   Lisinopril Other (See Comments)    kidney failure   Lithium Other (See Comments)    Migraines and drunk   Statins Other (See Comments)    Very weak and achy   Adhesive [Tape] Rash    All tape, paper and steri-strips included   Codeine Nausea And Vomiting   Scopolamine  Other (See Comments)    Causes vertigo     Past Medical  History:  Diagnosis Date   Anemia    Anesthesia complication    woke up during back surgery at [facility] 2020   Anxiety    Bipolar affective disorder (HCC)    Cancer (HCC)    skin cancer   Chronic kidney disease    stage 3 kidney disease   COPD (chronic obstructive pulmonary disease) (HCC)    Cough    Depression    DJD (degenerative joint disease) of lumbar spine    DVT (deep venous thrombosis) (HCC) 2010   groin left - on coumadin for 6 months   Family history of anesthesia complication    Hx: father has nausea   Fibromyalgia    GERD (gastroesophageal reflux disease)    Left leg DVT (HCC) 2010   Lymphedema 2019   Migraine    Peripheral vascular disease    Pneumonia    PONV (postoperative nausea and vomiting)    and migraines   Pre-diabetes    Shortness of breath    exertion   Spinal cord stimulator dysfunction    has one in,but not working   Spinal headache    Vertigo    Hx: of   Vocal cord dysfunction    sees dr. shelah     Past Surgical History:  Procedure Laterality Date   ABDOMINAL HYSTERECTOMY  1980   partial    ANTERIOR LAT LUMBAR FUSION Left 02/15/2014   Procedure: ANTERIOR LATERAL LUMBAR INTERBODY FUSION LUMBAR TWO-THREE,LUMBAR THREE-FOUR WITH LATERAL PLATE.;  Surgeon: Victory DELENA Gunnels, MD;  Location: [facility];  Service: Neurosurgery;  Laterality: Left;  left    APPENDECTOMY     BACK SURGERY     BLEPHAROPLASTY     bilateral   BREAST ENHANCEMENT SURGERY  1989   CARPAL TUNNEL RELEASE  2006   Right hand   CATARACT EXTRACTION W/ INTRAOCULAR LENS  IMPLANT, BILATERAL     Hx: of   CERVICAL FUSION  2005   CHOLECYSTECTOMY  1989   COLONOSCOPY W/ BIOPSIES AND POLYPECTOMY     Hx: of   cyst removed from right thumb      DENTAL RESTORATION/EXTRACTION WITH X-RAY     16 teeth removed due to osteonecrosis due to fosamax   ESOPHAGOGASTRODUODENOSCOPY N/A 09/13/2023   Procedure: EGD (ESOPHAGOGASTRODUODENOSCOPY);  Surgeon: Rollin Dover, MD;  Location: [facility];  Service: Gastroenterology;  Laterality: N/A;   eyebrow and eyelid lift      2024   FINGER ARTHROSCOPY WITH CARPOMETACARPEL ([facility]) ARTHROPLASTY     thumb   FRACTURE SURGERY Right 09/2013   ankle and leg - plate and screws   Fusion of third joint of index finger on right hand      IR GASTROSTOMY TUBE MOD SED  11/01/2023   lipoma removed right elbow      lumbar arthrodesis      LUMBAR LAMINECTOMY/DECOMPRESSION MICRODISCECTOMY Right 11/06/2012   Procedure: LUMBAR TWO THREE LUMBAR LAMINECTOMY/DECOMPRESSION MICRODISCECTOMY 1 LEVEL;  Surgeon: Victory DELENA Gunnels, MD;  Location: [facility];  Service: Neurosurgery;  Laterality: Right;  OPEN REDUCTION INTERNAL FIXATION (ORIF) DISTAL RADIAL FRACTURE Right 05/05/2018   Procedure: OPEN REDUCTION INTERNAL FIXATION (ORIF) DISTAL RADIAL FRACTURE;  Surgeon: Sissy Cough, MD;  Location: [facility];  Service: Orthopedics;  Laterality: Right;   ORIF ANKLE FRACTURE Right 2015   ORIF TIBIA FRACTURE Right 2015   PARTIAL HYSTERECTOMY  1980   recurrent right ankle surgery      right ankle surgery      right wrist surgery      with plates and screws   ROTATOR CUFF REPAIR Right 2017   SHOULDER ARTHROSCOPY     SPINAL CORD STIMULATOR BATTERY EXCHANGE Right 09/09/2017   Procedure: SPINAL CORD STIMULATOR BATTERY EXCHANGE;  Surgeon: Mindi Mt, MD;  Location: [facility];  Service: Neurosurgery;  Laterality: Right;  SPINAL CORD STIMULATOR BATTERY EXCHANGE   SPINAL CORD STIMULATOR IMPLANT  2010   SPINAL FUSION  2018   L1, L2   TARSAL SUSPENSION     Hx; of left thumb   TONSILLECTOMY  1955   UPPER GI ENDOSCOPY     Hx: of   WISDOM TOOTH EXTRACTION      Social History   Socioeconomic History   Marital status: Married    Spouse name: Not on file   Number of children: Not on file   Years of education: Not on file   Highest education level: Not on file  Occupational History   Not on file  Tobacco Use   Smoking status: Former     Current packs/day: 0.00    Average packs/day: 2.5 packs/day for 21.0 years (52.5 ttl pk-yrs)    Types: Cigarettes    Start date: 09/15/1965    Quit date: 09/16/1986    Years since quitting: 37.1   Smokeless tobacco: Never  Vaping Use   Vaping status: Never Used  Substance and Sexual Activity   Alcohol  use: Not Currently    Comment: recovering alcoholic sober since 1989   Drug use: Not Currently   Sexual activity: Not on file  Other Topics Concern   Not on file  Social History Narrative   Not on file   Social Drivers of Health   Financial Resource Strain: Not on file  Food Insecurity: Food Insecurity Present (09/23/2023)   Hunger Vital Sign    Worried About Running Out of Food in the Last Year: Sometimes true    Ran Out of Food in the Last Year: Sometimes true  Transportation Needs: No Transportation Needs (09/23/2023)   PRAPARE - Administrator, Civil Service (Medical): No    Lack of Transportation (Non-Medical): No  Recent Concern: Transportation Needs - Unmet Transportation Needs (09/21/2023)   PRAPARE - Administrator, Civil Service (Medical): No    Lack of Transportation (Non-Medical): Yes  Physical Activity: Not on file  Stress: Not on file  Social Connections: Unknown (06/23/2021)   Received from [facility]   Social Network    Social Network: Not on file  Intimate Partner Violence: Not At Risk (09/23/2023)   Humiliation, Afraid, Rape, and Kick questionnaire    Fear of Current or Ex-Partner: No    Emotionally Abused: No    Physically Abused: No    Sexually Abused: No    Family History  Problem Relation Age of Onset   Cancer Mother        lung   Lung cancer Mother    Cancer Father    Prostate cancer Father    Heart disease Father  Asthma Sister    Rheum arthritis Paternal Grandmother      Current Outpatient Medications:    ALPRAZolam  (XANAX ) 1 MG tablet, Take 1 mg by mouth See admin instructions. Take 1 tablet (1 mg) by mouth  scheduled twice daily, may take an additional dose in the afternoon, if needed for anxiety., Disp: , Rfl:    ARIPiprazole  (ABILIFY ) 30 MG tablet, Take 30 mg by mouth at bedtime., Disp: , Rfl: 5   butalbital -acetaminophen -caffeine  (FIORICET , ESGIC ) 50-325-40 MG per tablet, Take 2 tablets by mouth every 4 (four) hours as needed for headache or migraine. , Disp: , Rfl:    chlorpheniramine (CHLOR-TRIMETON) 4 MG tablet, Take 8 mg by mouth at bedtime., Disp: , Rfl:    cyclobenzaprine  (FLEXERIL ) 10 MG tablet, Take 10 mg by mouth at bedtime., Disp: , Rfl:    cycloSPORINE  (RESTASIS ) 0.05 % ophthalmic emulsion, Place 1 drop into both eyes 2 (two) times daily., Disp: , Rfl:    dexlansoprazole  (DEXILANT ) 60 MG capsule, Take 1 capsule (60 mg total) by mouth daily., Disp: 30 capsule, Rfl: 0   diphenoxylate-atropine (LOMOTIL) 2.5-0.025 MG tablet, Take 1 tablet by mouth 4 (four) times daily as needed for diarrhea or loose stools., Disp: 45 tablet, Rfl: 0   ezetimibe  (ZETIA ) 10 MG tablet, Take 10 mg by mouth at bedtime., Disp: , Rfl:    fenofibrate (TRICOR) 145 MG tablet, Take 145 mg by mouth in the morning., Disp: , Rfl:    fluticasone  (FLONASE ) 50 MCG/ACT nasal spray, Place 2 sprays into both nostrils 2 (two) times daily., Disp: 16 g, Rfl: 11   HYDROmorphone  (DILAUDID ) 2 MG tablet, Take 2 mg by mouth in the morning, at noon, and at bedtime., Disp: , Rfl:    HYDROmorphone  (DILAUDID ) 2 MG tablet, Take 1 tablet (2 mg total) by mouth daily as needed for severe pain (pain score 7-10). May take 1 additional tablet daily as needed, Disp: 30 tablet, Rfl: 0   lamoTRIgine  (LAMICTAL ) 200 MG tablet, Take 200 mg by mouth at bedtime., Disp: , Rfl:    linaclotide (LINZESS) 290 MCG CAPS capsule, Take 290 mcg by mouth daily before breakfast., Disp: , Rfl:    lisdexamfetamine (VYVANSE ) 50 MG capsule, Take 50 mg by mouth daily after breakfast., Disp: , Rfl:    loperamide (IMODIUM A-D) 2 MG tablet, Take 2-4 mg by mouth 4 (four) times  daily as needed for diarrhea or loose stools., Disp: , Rfl:    meclizine  (ANTIVERT ) 25 MG tablet, Take 25 mg by mouth 3 (three) times daily as needed for dizziness. , Disp: , Rfl:    Melatonin 12 MG TABS, Take 12 mg by mouth at bedtime., Disp: , Rfl:    Nutritional Supplements (NUTREN 1.5) LIQD, Give 1 carton, 5 times a day with bolus syringe.  Flush with 60ml of water before and after each feeding. Give additional 240ml of water 2 times during the day for additional hydration., Disp: , Rfl:    ondansetron  (ZOFRAN ) 8 MG tablet, Take 1 tablet (8 mg total) by mouth every 8 (eight) hours as needed. May start taking 72 hours after each chemotherapy treatment, Disp: 30 tablet, Rfl: 1   Polyethyl Glycol-Propyl Glycol (SYSTANE OP), Place 1 drop into both eyes 3 (three) times daily as needed (dry/irritated eyes.)., Disp: , Rfl:    potassium chloride 20 MEQ/15ML (10%) SOLN, Take 15 mLs (20 mEq total) by mouth 2 (two) times daily. Per tube, Disp: 473 mL, Rfl: 0   pregabalin  (LYRICA ) 300  MG capsule, Take 300 mg by mouth in the morning and at bedtime., Disp: , Rfl:    prochlorperazine  (COMPAZINE ) 5 MG tablet, Take 1 tablet (5 mg total) by mouth every 6 (six) hours as needed for nausea or vomiting., Disp: 90 tablet, Rfl: 0   sucralfate  (CARAFATE ) 1 GM/10ML suspension, Take 10 mLs (1 g total) by mouth 4 (four) times daily., Disp: 1200 mL, Rfl: 0   torsemide (DEMADEX) 20 MG tablet, Take 20 mg by mouth 4 (four) times daily as needed (fluid retention.)., Disp: , Rfl:   PHYSICAL EXAM ECOG FS:1 - Symptomatic but completely ambulatory    Vitals:   11/14/23 1500  BP: 117/68  Pulse: 88  Resp: 20  Temp: 97.9 F (36.6 C)  TempSrc: Temporal  SpO2: 96%  Weight: 178 lb 4 oz (80.9 kg)   Physical Exam Vitals and nursing note reviewed.  Constitutional:      Appearance: She is not ill-appearing or toxic-appearing.  HENT:     Head: Normocephalic.  Eyes:     Conjunctiva/sclera: Conjunctivae normal.   Cardiovascular:     Rate and Rhythm: Normal rate and regular rhythm.     Pulses: Normal pulses.     Heart sounds: Normal heart sounds.  Pulmonary:     Effort: Pulmonary effort is normal.     Breath sounds: Normal breath sounds.  Abdominal:     General: There is no distension.     Comments: PEG tube without purulent drainage with palpation of surrounding area. Minimal erythema around tube. No tenderness.  Musculoskeletal:     Cervical back: Normal range of motion.  Skin:    General: Skin is warm and dry.  Neurological:     Mental Status: She is alert.        LABORATORY DATA I have reviewed the data as listed    Latest Ref Rng & Units 11/09/2023   10:12 AM 11/02/2023   11:20 AM 11/01/2023    9:17 AM  CBC  WBC 4.0 - 10.5 K/uL 5.4  5.7  3.5   Hemoglobin 12.0 - 15.0 g/dL 88.5  88.7  88.4   Hematocrit 36.0 - 46.0 % 32.5  32.8  33.6   Platelets 150 - 400 K/uL 217  243  260         Latest Ref Rng & Units 11/09/2023   10:12 AM 11/02/2023   11:20 AM 10/26/2023   11:02 AM  CMP  Glucose 70 - 99 mg/dL 893  872  897   BUN 8 - 23 mg/dL 10  16  9    Creatinine 0.44 - 1.00 mg/dL 9.19  9.42  9.26   Sodium 135 - 145 mmol/L 135  136  142   Potassium 3.5 - 5.1 mmol/L 3.0  3.3  3.8   Chloride 98 - 111 mmol/L 94  100  105   CO2 22 - 32 mmol/L 26  22  26    Calcium  8.9 - 10.3 mg/dL 9.2  9.4  9.6   Total Protein 6.5 - 8.1 g/dL 6.3  6.5  6.7   Total Bilirubin 0.0 - 1.2 mg/dL 0.3  0.4  0.2   Alkaline Phos 38 - 126 U/L 58  59  68   AST 15 - 41 U/L 16  12  16    ALT 0 - 44 U/L 6  6  5         RADIOGRAPHIC STUDIES (from last 24 hours if applicable) I have personally reviewed the radiological images as listed  and agreed with the findings in the report. No results found.      Visit Diagnosis: 1. Malignant neoplasm of gastroesophageal junction (HCC)   2. Irritation around percutaneous endoscopic gastrostomy (PEG) tube site (HCC)      No orders of the defined types were placed in this  encounter.   All questions were answered. The patient knows to call the clinic with any problems, questions or concerns. No barriers to learning was detected.  A total of more than 20 minutes were spent on this encounter with face-to-face time and non-face-to-face time, including preparing to see the patient, counseling the patient and coordination of care as outlined above.    Thank you for allowing me to participate in the care of this patient.    Daniyal Tabor E  Walisiewicz, PA-C Department of Hematology/Oncology [facility] at [facility] Phone: [phone]  [phone]    11/14/2023 4:22 PM

## 2023-11-15 ENCOUNTER — Ambulatory Visit

## 2023-11-15 ENCOUNTER — Other Ambulatory Visit: Payer: Self-pay

## 2023-11-15 ENCOUNTER — Ambulatory Visit
Admission: RE | Admit: 2023-11-15 | Discharge: 2023-11-15 | Disposition: A | Source: Ambulatory Visit | Attending: Radiation Oncology

## 2023-11-15 DIAGNOSIS — Z23 Encounter for immunization: Secondary | ICD-10-CM | POA: Diagnosis not present

## 2023-11-15 DIAGNOSIS — C159 Malignant neoplasm of esophagus, unspecified: Secondary | ICD-10-CM | POA: Diagnosis not present

## 2023-11-15 DIAGNOSIS — C160 Malignant neoplasm of cardia: Secondary | ICD-10-CM | POA: Diagnosis not present

## 2023-11-15 DIAGNOSIS — Z510 Encounter for antineoplastic radiation therapy: Secondary | ICD-10-CM | POA: Diagnosis not present

## 2023-11-15 LAB — RAD ONC ARIA SESSION SUMMARY
Course Elapsed Days: 34
Plan Fractions Treated to Date: 25
Plan Prescribed Dose Per Fraction: 1.8 Gy
Plan Total Fractions Prescribed: 25
Plan Total Prescribed Dose: 45 Gy
Reference Point Dosage Given to Date: 45 Gy
Reference Point Session Dosage Given: 1.8 Gy
Session Number: 25

## 2023-11-16 ENCOUNTER — Ambulatory Visit

## 2023-11-16 ENCOUNTER — Ambulatory Visit
Admission: RE | Admit: 2023-11-16 | Discharge: 2023-11-16 | Disposition: A | Source: Ambulatory Visit | Attending: Radiation Oncology

## 2023-11-16 ENCOUNTER — Inpatient Hospital Stay

## 2023-11-16 ENCOUNTER — Other Ambulatory Visit: Payer: Self-pay | Admitting: *Deleted

## 2023-11-16 ENCOUNTER — Other Ambulatory Visit: Payer: Self-pay

## 2023-11-16 ENCOUNTER — Other Ambulatory Visit

## 2023-11-16 ENCOUNTER — Inpatient Hospital Stay: Admitting: Oncology

## 2023-11-16 VITALS — BP 124/69 | HR 80 | Temp 98.3°F | Resp 18 | Ht 61.0 in | Wt 179.4 lb

## 2023-11-16 VITALS — BP 128/69 | HR 76 | Temp 98.0°F | Resp 18

## 2023-11-16 DIAGNOSIS — C160 Malignant neoplasm of cardia: Secondary | ICD-10-CM | POA: Diagnosis not present

## 2023-11-16 DIAGNOSIS — C159 Malignant neoplasm of esophagus, unspecified: Secondary | ICD-10-CM | POA: Diagnosis not present

## 2023-11-16 DIAGNOSIS — Z23 Encounter for immunization: Secondary | ICD-10-CM | POA: Diagnosis not present

## 2023-11-16 DIAGNOSIS — Z510 Encounter for antineoplastic radiation therapy: Secondary | ICD-10-CM | POA: Diagnosis not present

## 2023-11-16 LAB — CMP (CANCER CENTER ONLY)
ALT: 7 U/L (ref 0–44)
AST: 16 U/L (ref 15–41)
Albumin: 3.4 g/dL — ABNORMAL LOW (ref 3.5–5.0)
Alkaline Phosphatase: 65 U/L (ref 38–126)
Anion gap: 14 (ref 5–15)
BUN: 11 mg/dL (ref 8–23)
CO2: 27 mmol/L (ref 22–32)
Calcium: 9.3 mg/dL (ref 8.9–10.3)
Chloride: 95 mmol/L — ABNORMAL LOW (ref 98–111)
Creatinine: 0.62 mg/dL (ref 0.44–1.00)
GFR, Estimated: >60 mL/min (ref >60)
Glucose, Bld: 77 mg/dL (ref 70–99)
Potassium: 3.3 mmol/L — ABNORMAL LOW (ref 3.5–5.1)
Sodium: 136 mmol/L (ref 135–145)
Total Bilirubin: 0.3 mg/dL (ref 0.0–1.2)
Total Protein: 6.4 g/dL — ABNORMAL LOW (ref 6.5–8.1)

## 2023-11-16 LAB — CBC WITH DIFFERENTIAL (CANCER CENTER ONLY)
Abs Immature Granulocytes: 0.04 K/uL (ref 0.00–0.07)
Basophils Absolute: 0 K/uL (ref 0.0–0.1)
Basophils Relative: 1 %
Eosinophils Absolute: 0.2 K/uL (ref 0.0–0.5)
Eosinophils Relative: 5 %
HCT: 33.8 % — ABNORMAL LOW (ref 36.0–46.0)
Hemoglobin: 11.4 g/dL — ABNORMAL LOW (ref 12.0–15.0)
Immature Granulocytes: 1 %
Lymphocytes Relative: 2 %
Lymphs Abs: 0.1 K/uL — ABNORMAL LOW (ref 0.7–4.0)
MCH: 29 pg (ref 26.0–34.0)
MCHC: 33.7 g/dL (ref 30.0–36.0)
MCV: 86 fL (ref 80.0–100.0)
Monocytes Absolute: 0.5 K/uL (ref 0.1–1.0)
Monocytes Relative: 11 %
Neutro Abs: 3.5 K/uL (ref 1.7–7.7)
Neutrophils Relative %: 80 %
Platelet Count: 222 K/uL (ref 150–400)
RBC: 3.93 MIL/uL (ref 3.87–5.11)
RDW: 14.7 % (ref 11.5–15.5)
WBC Count: 4.4 K/uL (ref 4.0–10.5)
nRBC: 0 % (ref 0.0–0.2)

## 2023-11-16 LAB — RAD ONC ARIA SESSION SUMMARY
Course Elapsed Days: 35
Plan Fractions Treated to Date: 1
Plan Prescribed Dose Per Fraction: 1.8 Gy
Plan Total Fractions Prescribed: 3
Plan Total Prescribed Dose: 5.4 Gy
Reference Point Dosage Given to Date: 1.8 Gy
Reference Point Session Dosage Given: 1.8 Gy
Session Number: 26

## 2023-11-16 MED ORDER — FAMOTIDINE IN NACL 20-0.9 MG/50ML-% IV SOLN
20.0000 mg | Freq: Once | INTRAVENOUS | Status: AC
  Administered 2023-11-16: 20 mg via INTRAVENOUS
  Filled 2023-11-16: qty 50

## 2023-11-16 MED ORDER — PROCHLORPERAZINE MALEATE 5 MG PO TABS: 5.0000 mg | ORAL_TABLET | Freq: Four times a day (QID) | ORAL | 1 refills | Status: AC | PRN

## 2023-11-16 MED ORDER — SODIUM CHLORIDE 0.9 % IV SOLN
175.8000 mg | Freq: Once | INTRAVENOUS | Status: AC
  Administered 2023-11-16: 180 mg via INTRAVENOUS
  Filled 2023-11-16: qty 18

## 2023-11-16 MED ORDER — DEXAMETHASONE SODIUM PHOSPHATE 10 MG/ML IJ SOLN
10.0000 mg | Freq: Once | INTRAMUSCULAR | Status: AC
  Administered 2023-11-16: 10 mg via INTRAVENOUS
  Filled 2023-11-16: qty 1

## 2023-11-16 MED ORDER — DIPHENOXYLATE-ATROPINE 2.5-0.025 MG PO TABS
1.0000 | ORAL_TABLET | Freq: Four times a day (QID) | ORAL | 0 refills | Status: AC | PRN
Start: 2023-11-16 — End: ?

## 2023-11-16 MED ORDER — PALONOSETRON HCL INJECTION 0.25 MG/5ML
0.2500 mg | Freq: Once | INTRAVENOUS | Status: AC
  Administered 2023-11-16: 0.25 mg via INTRAVENOUS
  Filled 2023-11-16: qty 5

## 2023-11-16 MED ORDER — DIPHENHYDRAMINE HCL 50 MG/ML IJ SOLN
25.0000 mg | Freq: Once | INTRAMUSCULAR | Status: AC
  Administered 2023-11-16: 25 mg via INTRAVENOUS
  Filled 2023-11-16: qty 1

## 2023-11-16 MED ORDER — SODIUM CHLORIDE 0.9 % IV SOLN: INTRAVENOUS | Status: DC

## 2023-11-16 MED ORDER — SODIUM CHLORIDE 0.9 % IV SOLN
50.0000 mg/m2 | Freq: Once | INTRAVENOUS | Status: AC
  Administered 2023-11-16: 96 mg via INTRAVENOUS
  Filled 2023-11-16: qty 16

## 2023-11-16 NOTE — Progress Notes (Signed)
 [name] OFFICE PROGRESS NOTE   Diagnosis: Gastroesophageal cancer  INTERVAL HISTORY:   [name] completed another cycle of paclitaxel /carboplatin  on 11/09/2023.  No neuropathy symptoms.  She has persistent nausea and odynophagia.  She developed diarrhea after starting tube feedings.  The feeding formula has been changed.  She continues to have solid dysphagia.  Lomotil has not helped the diarrhea.  Objective:  Vital signs in last 24 hours:  Blood pressure 124/69, pulse 80, temperature 98.3 F (36.8 C), temperature source Temporal, resp. rate 18, height 5' 1 (1.549 m), weight 179 lb 6.4 oz (81.4 kg), SpO2 96%.    HEENT: No thrush or ulcers Resp: Lungs clear bilaterally Cardio: Regular rate and rhythm GI: Nontender, no hepatosplenomegaly, no mass Vascular: The left leg lower leg is slightly larger than the right side, no edema   Lab Results:  Lab Results  Component Value Date   WBC 4.4 11/16/2023   HGB 11.4 (L) 11/16/2023   HCT 33.8 (L) 11/16/2023   MCV 86.0 11/16/2023   PLT 222 11/16/2023   NEUTROABS 3.5 11/16/2023    CMP  Lab Results  Component Value Date   NA 135 11/09/2023   K 3.0 (L) 11/09/2023   CL 94 (L) 11/09/2023   CO2 26 11/09/2023   GLUCOSE 106 (H) 11/09/2023   BUN 10 11/09/2023   CREATININE 0.80 11/09/2023   CALCIUM  9.2 11/09/2023   PROT 6.3 (L) 11/09/2023   ALBUMIN  3.3 (L) 11/09/2023   AST 16 11/09/2023   ALT 6 11/09/2023   ALKPHOS 58 11/09/2023   BILITOT 0.3 11/09/2023   GFRNONAA >60 11/09/2023   GFRAA >60 09/06/2017    No results found for: CEA1, CEA, CAN199, CA125  Lab Results  Component Value Date   INR 1.0 11/01/2023   LABPROT 13.8 11/01/2023    Imaging:  No results found.  Medications: I have reviewed the patient's current medications.   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  Non-hypermetabolic right middle lobe perifissural nodule below PET resolution and stable from prior, right subcarinal and low paratracheal lymph nodes suspicious for metastases, focal soft tissue uptake adjacent to the right ischium without CT correlate Concurrent chemoradiation starting 10/12/2023 Cycle 1 day 1 carboplatin /Taxol  10/14/2023 Cycle 1 day 8 carboplatin /Taxol  10/19/2023 Cycle 1 day 15 carboplatin /Taxol  10/26/2023 Cycle 1 day 22 carboplatin /Taxol  11/02/2023 Cycle 1 day 29 carboplatin /Taxol  11/09/2023 Cycle 1 day 36 carboplatin /Taxol  11/16/2023 Radiation completed 11/18/2023 COPD 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  History of tobacco and alcohol  use, none at present 11.   Alcoholism 12.  Solid dysphagia and anterior chest pain secondary to #1 13.  Odynophagia secondary to radiation: Supra fate added 10/26/2023 14. Hypokalemia secondary to GI loss and diuretic       Disposition: [name] will complete a final week of Taxol /carboplatin  today.  She has tolerated the chemotherapy well to date.  She is scheduled to complete 11/18/2023.  She will continue  antiacid therapy for the odynophagia.  She continues tube feedings.  She will return for an office and lab visit in 3 weeks. We will refer her for a restaging chest CT and upper endoscopy in 6-8 weeks.  Arley Hof, MD  11/16/2023  10:41 AM

## 2023-11-16 NOTE — Progress Notes (Signed)
 Patient seen by Dr. Arley Hof today  Vitals are within treatment parameters:Yes   Labs are within treatment parameters: Yes   Treatment plan has been signed: Yes   Per physician team, Patient is ready for treatment and there are NO modifications to the treatment plan.

## 2023-11-16 NOTE — Patient Instructions (Signed)
 [facility] - A DEPT OF [facility]  Discharge Instructions: Thank you for choosing [facility] to provide your oncology and hematology care.   If you have a lab appointment with the Cancer Center, please go directly to the Cancer Center and check in at the registration area.   Wear comfortable clothing and clothing appropriate for easy access to any Portacath or PICC line.   We strive to give you quality time with your provider. You may need to reschedule your appointment if you arrive late (15 or more minutes).  Arriving late affects you and other patients whose appointments are after yours.  Also, if you miss three or more appointments without notifying the office, you may be dismissed from the clinic at the provider's discretion.      For prescription refill requests, have your pharmacy contact our office and allow 72 hours for refills to be completed.    Today you received the following chemotherapy and/or immunotherapy agents: paclitaxel and carboplatin      To help prevent nausea and vomiting after your treatment, we encourage you to take your nausea medication as directed.  BELOW ARE SYMPTOMS THAT SHOULD BE REPORTED IMMEDIATELY: *FEVER GREATER THAN 100.4 F (38 C) OR HIGHER *CHILLS OR SWEATING *NAUSEA AND VOMITING THAT IS NOT CONTROLLED WITH YOUR NAUSEA MEDICATION *UNUSUAL SHORTNESS OF BREATH *UNUSUAL BRUISING OR BLEEDING *URINARY PROBLEMS (pain or burning when urinating, or frequent urination) *BOWEL PROBLEMS (unusual diarrhea, constipation, pain near the anus) TENDERNESS IN MOUTH AND THROAT WITH OR WITHOUT PRESENCE OF ULCERS (sore throat, sores in mouth, or a toothache) UNUSUAL RASH, SWELLING OR PAIN  UNUSUAL VAGINAL DISCHARGE OR ITCHING   Items with * indicate a potential emergency and should be followed up as soon as possible or go to the Emergency Department if any problems should occur.  Please show the CHEMOTHERAPY ALERT CARD  or IMMUNOTHERAPY ALERT CARD at check-in to the Emergency Department and triage nurse.  Should you have questions after your visit or need to cancel or reschedule your appointment, please contact [facility]  Dept: [phone]  and follow the prompts.  Office hours are 8:00 a.m. to 4:30 p.m. Monday - Friday. Please note that voicemails left after 4:00 p.m. may not be returned until the following business day.  We are closed weekends and major holidays. You have access to a nurse at all times for urgent questions. Please call the main number to the clinic Dept: [phone] and follow the prompts.   For any non-urgent questions, you may also contact your provider using MyChart. We now offer e-Visits for anyone 57 and older to request care online for non-urgent symptoms. For details visit [email or web address].   Also download the MyChart app! Go to the app store, search "MyChart", open the app, select [facility], and log in with your MyChart username and password.

## 2023-11-17 ENCOUNTER — Other Ambulatory Visit: Payer: Self-pay

## 2023-11-17 ENCOUNTER — Ambulatory Visit
Admission: RE | Admit: 2023-11-17 | Discharge: 2023-11-17 | Disposition: A | Discharge status: Home / Self Care | Source: Ambulatory Visit | Attending: Radiation Oncology | Admitting: Radiation Oncology

## 2023-11-17 ENCOUNTER — Ambulatory Visit

## 2023-11-17 DIAGNOSIS — C160 Malignant neoplasm of cardia: Secondary | ICD-10-CM | POA: Diagnosis not present

## 2023-11-17 LAB — RAD ONC ARIA SESSION SUMMARY
Course Elapsed Days: 36
Plan Fractions Treated to Date: 2
Plan Prescribed Dose Per Fraction: 1.8 Gy
Plan Total Fractions Prescribed: 3
Plan Total Prescribed Dose: 5.4 Gy
Reference Point Dosage Given to Date: 3.6 Gy
Reference Point Session Dosage Given: 1.8 Gy
Session Number: 27

## 2023-11-18 ENCOUNTER — Ambulatory Visit

## 2023-11-18 ENCOUNTER — Other Ambulatory Visit: Payer: Self-pay

## 2023-11-18 ENCOUNTER — Ambulatory Visit
Admission: RE | Admit: 2023-11-18 | Discharge: 2023-11-18 | Disposition: A | Discharge status: Home / Self Care | Source: Ambulatory Visit | Attending: Radiation Oncology | Admitting: Radiation Oncology

## 2023-11-18 DIAGNOSIS — C160 Malignant neoplasm of cardia: Secondary | ICD-10-CM | POA: Diagnosis not present

## 2023-11-18 LAB — RAD ONC ARIA SESSION SUMMARY
Course Elapsed Days: 37
Plan Fractions Treated to Date: 3
Plan Prescribed Dose Per Fraction: 1.8 Gy
Plan Total Fractions Prescribed: 3
Plan Total Prescribed Dose: 5.4 Gy
Reference Point Dosage Given to Date: 5.4 Gy
Reference Point Session Dosage Given: 1.8 Gy
Session Number: 28

## 2023-11-21 ENCOUNTER — Ambulatory Visit

## 2023-11-21 ENCOUNTER — Inpatient Hospital Stay

## 2023-11-21 MED ORDER — KATE FARMS PED PEPTIDE 1.5 PO LIQD: ORAL | Status: DC

## 2023-11-21 NOTE — Progress Notes (Signed)
 Nutrition Follow-up:  Patient with gastroesophageal cancer.  Followed by Dr Cloretta.  Completed concurrent chemotherapy (carboplatin  and paclitaxel ) on 10/8 and last radiation on 10/10.  Patient required PEG tube placement on 9/23 due to dysphagia and unable to take in enough oral intake.    Spoke with patient via phone for follow-up.  Reports that she is tolerating the samples of [employer] 1.5 peptide better than the nutren 1.5.  Reports that while on the nutren 1.5 diarrhea was pouring out of her and she could not make it to the bathroom.  Reports that after trial of [employer] 1.5 peptide stools were less frequent, loose and she has been able to make it to the bathroom.  Says that she has only be able to do 2 feedings a day. She is afraid to do more feedings because if she has to go out she may have diarrhea.  Hoping to get up to 3 feedings a day with less diarrhea.  Feels weak and having trouble with GERD.     Medications: carafate , lomotil  Labs: K 3.3 on 10/8, albumin  3.4  Anthropometrics:   Weight 179 lb 6.4 oz on 10/8 181 lb 4.8 oz on 10/1 179 lb on 9/29 184 lb on 9/24 187 lb on 9/15 193 lb on 9/10 (Dr Andriette office)  7% weight loss in the last month, significant   Estimated Energy Needs  Kcals: 8199-7874 Protein: 85-102 g Fluid: >  NUTRITION DIAGNOSIS: Inadequate oral intake continues, relying on feeding tube   INTERVENTION:  Recommend [employer] 1.5 peptide, 4 cartons per day via bolus tube feeding. Flush with 60ml of water before and after each feeding.  Flush with additional 3 cups water via feeding tube for additional hydration.  Provides 2000 kcals, 96 g protein and 2108 ml free water.  Notified [employer] of request for formula change.      MONITORING, EVALUATION, GOAL: weight trends, intake   NEXT VISIT: Monday, Oct 27 phone call  Feliz Herard B. Dasie SOLON, CSO, LDN Registered Dietitian [phone]

## 2023-11-22 ENCOUNTER — Ambulatory Visit

## 2023-11-22 ENCOUNTER — Other Ambulatory Visit: Payer: Self-pay | Admitting: Oncology

## 2023-11-22 NOTE — Radiation Completion Notes (Addendum)
  Radiation Oncology         [phone] ________________________________  Name: [name] MRN: [id number]  Date of Service: 11/18/2023  DOB: December 31, 1945  End of Treatment Note  Diagnosis: Adenocarcinoma of the GE Junction  Intent: Curative     ==========DELIVERED PLANS==========  First Treatment Date: 2023-10-12 Last Treatment Date: 2023-11-18   Plan Name: Esoph Site: Esophagus Technique: IMRT Mode: Photon Dose Per Fraction: 1.8 Gy Prescribed Dose (Delivered / Prescribed): 45 Gy / 45 Gy Prescribed Fxs (Delivered / Prescribed): 25 / 25   Plan Name: Esoph_Bst Site: Esophagus Technique: IMRT Mode: Photon Dose Per Fraction: 1.8 Gy Prescribed Dose (Delivered / Prescribed): 5.4 Gy / 5.4 Gy Prescribed Fxs (Delivered / Prescribed): 3 / 3     ==========ON TREATMENT VISIT DATES========== 2023-10-14, 2023-10-21, 2023-10-28, 2023-11-02, 2023-11-11, 2023-11-18     See weekly On Treatment Notes in Epic for details in the Media tab (listed as Progress notes on the On Treatment Visit Dates listed above). The patient tolerated radiation. She developed fatigue and continued to have esophagitis and discomfort from her PEG tube  The patient will receive a call in about one month from the radiation oncology department. She will continue follow up with Dr. Cloretta as well.      Donald KYM Husband, PAC

## 2023-11-23 ENCOUNTER — Ambulatory Visit

## 2023-11-23 DIAGNOSIS — C160 Malignant neoplasm of cardia: Secondary | ICD-10-CM | POA: Diagnosis not present

## 2023-11-23 DIAGNOSIS — R1310 Dysphagia, unspecified: Secondary | ICD-10-CM | POA: Diagnosis not present

## 2023-11-23 DIAGNOSIS — C159 Malignant neoplasm of esophagus, unspecified: Secondary | ICD-10-CM | POA: Diagnosis not present

## 2023-11-30 ENCOUNTER — Telehealth: Payer: Self-pay | Admitting: *Deleted

## 2023-11-30 NOTE — Telephone Encounter (Signed)
**Note De-identified  [place] Obfuscation** Please advise 

## 2023-11-30 NOTE — Telephone Encounter (Signed)
 Copied from CRM #[id number]. Topic: Appointments - Scheduling Inquiry for Clinic >> Nov 29, 2023 10:31 AM Leila BROCKS wrote: Reason for CRM: Patient [phone] states got a message to schedule an appointment. During office visit 07/07/23 with NP, Parrett advised patient to 6 months f/u with Dr. Geronimo and PFT. Patient was diagnosis with esophagus cancer about 6 weeks ago, had raidation and chemo treatment for  six weeks, nd will have a scan in five weeks to see if chemo helped. Patient asked if she needs to schedule now or wait until all this cancer treatments is done? Patient has not eaten in 7 weeks and has a feeding tube, even drinking water hurts. Please advise and call back.  ATC x1.  LVM to return call.

## 2023-12-01 ENCOUNTER — Encounter: Payer: Self-pay | Admitting: Oncology

## 2023-12-01 ENCOUNTER — Encounter: Payer: Self-pay | Admitting: Internal Medicine

## 2023-12-01 NOTE — Telephone Encounter (Signed)
 ATC X2. LMTCB or check mychart messages.   I will go ahead and send pt a mychart message them completing note due to office protocol. NFN

## 2023-12-01 NOTE — Telephone Encounter (Signed)
 So sorry to hear this, please let her know to reach out to us  if she needs anything. Sounds like that she is going through a lot right now it would be fine to wait until after her next scan but she can go ahead and get on the schedule for sometime in January and we can always adjust if needed

## 2023-12-02 ENCOUNTER — Other Ambulatory Visit: Payer: Self-pay

## 2023-12-03 ENCOUNTER — Other Ambulatory Visit: Payer: Self-pay | Admitting: Oncology

## 2023-12-05 ENCOUNTER — Encounter: Payer: Self-pay | Admitting: Internal Medicine

## 2023-12-05 ENCOUNTER — Inpatient Hospital Stay

## 2023-12-05 ENCOUNTER — Encounter: Payer: Self-pay | Admitting: Oncology

## 2023-12-05 NOTE — Progress Notes (Signed)
  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 Staging on 2023-09-23: Malignant neoplasm of gastroesophageal junction (HCC) T=cTX, N=cN1, M=cM0   First Treatment Date: 2023-10-12 Last Treatment Date: 2023-11-18   Plan Name: Esoph Site: Esophagus Technique: IMRT Mode: Photon Dose Per Fraction: 1.8 Gy Prescribed Dose (Delivered / Prescribed): 45 Gy / 45 Gy Prescribed Fxs (Delivered / Prescribed): 25 / 25   Plan Name: Esoph_Bst Site: Esophagus Technique: IMRT Mode: Photon Dose Per Fraction: 1.8 Gy Prescribed Dose (Delivered / Prescribed): 5.4 Gy / 5.4 Gy Prescribed Fxs (Delivered / Prescribed): 3 / 3  The patient was available for call today.   Symptoms of fatigue have not improved since completing therapy.  Symptoms of skin changes have improved since completing therapy.  Symptoms of esophagitis have improved some since completing therapy. Patient continues to try to eat soft foods that are easy to swallow. She is also doing protein drinks.   The patient has scheduled follow up with her medical oncologist Dr. Cloretta for ongoing care, and was encouraged to call if she develops concerns or questions regarding radiation.

## 2023-12-05 NOTE — Progress Notes (Addendum)
 Nutrition Follow-up:  Patient with gastroesophageal cancer.  Followed by Dr Cloretta.  Completed concurrent chemotherapy (10/8) and radiation 10/10.  Patient required PEG tube placement on 9/23 due to dysphagia and unable to take in enough nutrition by mouth.   Spoke with patient via phone for follow-up.  Reports that she is using 2-3 cartons of [employer] peptide 1.5 via feeding tube.  She reports no more diarrhea.  Had regular bowel movement yesterday.  Reports that she continues to have issues with heartburn.  Taking pudding by mouth, yogurt, fluids, tried chicken noodle soup but did not work well.  Also reports more  nausea now than during chemotherapy.  Has been taking compazine  and zofran  and they help her. Has not tried ensure/boost by mouth recently.     Medications: reviewed  Labs: reviewed  Anthropometrics:   Weight 179 lb on 10/8 181 lb 4.8 oz on 10/1 179 lb on 9/29 184 lb on 9/24 187 lb on 9/15 193 lb on 9/10    Estimated Energy Needs  Kcals: 8199-7874 Protein: 85-102 g Fluid: > 1800 ml  NUTRITION DIAGNOSIS: Inadequate oral intake continues, relying on feeding tube   INTERVENTION:  Continue [employer] peptide, 1.5, ideally 4 (8am, noon, 4pm and 8pm) cartons per day via feeding tube if unable to take oral nutrition.  Flush with 60ml of water before and after each feeding. Provide additional 3 cups of fluid via tube if unable to drink orally.  Encouraged trying ensure/boost shakes orally.  Discussed other soft, liquid foods to try.   Encouraged antinausea medication to decrease nausea and allow for more nutrition [employer] providing formula and enteral supplies    MONITORING, EVALUATION, GOAL: weight trends, tube feeding, intake   NEXT VISIT: phone call on Monday, Nov 10  Amorie Rentz B. Dasie SOLON, CSO, LDN Registered Dietitian [phone]

## 2023-12-07 ENCOUNTER — Encounter: Payer: Self-pay | Admitting: Nurse Practitioner

## 2023-12-07 ENCOUNTER — Inpatient Hospital Stay (HOSPITAL_BASED_OUTPATIENT_CLINIC_OR_DEPARTMENT_OTHER): Admitting: Nurse Practitioner

## 2023-12-07 ENCOUNTER — Inpatient Hospital Stay

## 2023-12-07 VITALS — BP 105/73 | HR 69 | Temp 98.2°F | Resp 18 | Ht 61.0 in | Wt 176.9 lb

## 2023-12-07 DIAGNOSIS — C160 Malignant neoplasm of cardia: Secondary | ICD-10-CM

## 2023-12-07 DIAGNOSIS — Z23 Encounter for immunization: Secondary | ICD-10-CM | POA: Diagnosis not present

## 2023-12-07 LAB — CMP (CANCER CENTER ONLY)
ALT: 7 U/L (ref 0–44)
AST: 21 U/L (ref 15–41)
Albumin: 3.5 g/dL (ref 3.5–5.0)
Alkaline Phosphatase: 101 U/L (ref 38–126)
Anion gap: 11 (ref 5–15)
BUN: 15 mg/dL (ref 8–23)
CO2: 27 mmol/L (ref 22–32)
Calcium: 9.1 mg/dL (ref 8.9–10.3)
Chloride: 101 mmol/L (ref 98–111)
Creatinine: 0.71 mg/dL (ref 0.44–1.00)
GFR, Estimated: >60 mL/min (ref >60)
Glucose, Bld: 100 mg/dL — ABNORMAL HIGH (ref 70–99)
Potassium: 3.5 mmol/L (ref 3.5–5.1)
Sodium: 138 mmol/L (ref 135–145)
Total Bilirubin: 0.3 mg/dL (ref 0.0–1.2)
Total Protein: 6 g/dL — ABNORMAL LOW (ref 6.5–8.1)

## 2023-12-07 LAB — CBC WITH DIFFERENTIAL (CANCER CENTER ONLY)
Abs Immature Granulocytes: 0.09 K/uL — ABNORMAL HIGH (ref 0.00–0.07)
Basophils Absolute: 0 K/uL (ref 0.0–0.1)
Basophils Relative: 1 %
Eosinophils Absolute: 0.1 K/uL (ref 0.0–0.5)
Eosinophils Relative: 2 %
HCT: 36.4 % (ref 36.0–46.0)
Hemoglobin: 12.3 g/dL (ref 12.0–15.0)
Immature Granulocytes: 1 %
Lymphocytes Relative: 5 %
Lymphs Abs: 0.3 K/uL — ABNORMAL LOW (ref 0.7–4.0)
MCH: 30 pg (ref 26.0–34.0)
MCHC: 33.8 g/dL (ref 30.0–36.0)
MCV: 88.8 fL (ref 80.0–100.0)
Monocytes Absolute: 0.9 K/uL (ref 0.1–1.0)
Monocytes Relative: 13 %
Neutro Abs: 5.3 K/uL (ref 1.7–7.7)
Neutrophils Relative %: 78 %
Platelet Count: 277 K/uL (ref 150–400)
RBC: 4.1 MIL/uL (ref 3.87–5.11)
RDW: 18 % — ABNORMAL HIGH (ref 11.5–15.5)
WBC Count: 6.8 K/uL (ref 4.0–10.5)
nRBC: 0 % (ref 0.0–0.2)

## 2023-12-07 NOTE — Progress Notes (Signed)
  [facility] OFFICE PROGRESS NOTE   Diagnosis: Gastroesophageal cancer  INTERVAL HISTORY:   Ms. [name] returns as scheduled.  She reports continued dysphagia/odynophagia.  She has intermittent nausea, no vomiting.  She complains of heartburn.  She takes a Carafate  slurry 4 times a day.  She discontinued Dexilant .  She continues tube feedings with free water.  No significant intake by mouth.  Bowels are moving.  Occasional loose stool.  Objective:  Vital signs in last 24 hours:  Blood pressure 105/73, pulse 69, temperature 98.2 F (36.8 C), temperature source Temporal, resp. rate 18, height 5' 1 (1.549 m), weight 176 lb 14.4 oz (80.2 kg), SpO2 96%.    HEENT: Mucous membranes appear moist. Resp: Lungs clear bilaterally. Cardio: Regular rate and rhythm. GI: Nontender.  No hepatosplenomegaly.  Left upper abdomen feeding tube site with mild erythema. Vascular: No leg edema. Skin: Skin turgor intact.   Lab Results:  Lab Results  Component Value Date   WBC 6.8 12/07/2023   HGB 12.3 12/07/2023   HCT 36.4 12/07/2023   MCV 88.8 12/07/2023   PLT 277 12/07/2023   NEUTROABS 5.3 12/07/2023    Imaging:  No results found.  Medications: I have reviewed the patient's current medications.  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  Non-hypermetabolic right middle lobe  perifissural nodule below PET resolution and stable from prior, right subcarinal and low paratracheal lymph nodes suspicious for metastases, focal soft tissue uptake adjacent to the right ischium without CT correlate Concurrent chemoradiation starting 10/12/2023 Cycle 1 day 1 carboplatin /Taxol  10/14/2023 Cycle 1 day 8 carboplatin /Taxol  10/19/2023 Cycle 1 day 15 carboplatin /Taxol  10/26/2023 Cycle 1 day 22 carboplatin /Taxol  11/02/2023 Cycle 1 day 29 carboplatin /Taxol  11/09/2023 Cycle 1 day 36 carboplatin /Taxol  11/16/2023 Radiation completed 11/18/2023 COPD 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  History of tobacco and alcohol  use, none at present 11.   Alcoholism 12.  Solid dysphagia and anterior chest pain secondary to #1 13.  Odynophagia secondary to radiation: Supra fate added 10/26/2023 14. Hypokalemia secondary to GI loss and diuretic     Disposition: Ms. [name] appears stable.  She completed the final weekly treatment with carboplatin /Taxol  11/16/2023.  She completed radiation 11/18/2023.  She has persistent dysphagia/odynophagia.  Hopefully she will begin to see improvement over the next 1 to 2 weeks.  She will continue tube feedings/free water to maintain nutrition and hydration.  We reviewed the overall plan for a restaging chest CT and upper endoscopy in 4-5 weeks.  She will return for a follow-up visit in 2 to 3 weeks.  We are available to see her sooner if needed.    Olam Ned ANP/GNP-BC   12/07/2023  2:15 PM

## 2023-12-09 DIAGNOSIS — I10 Essential (primary) hypertension: Secondary | ICD-10-CM | POA: Diagnosis not present

## 2023-12-09 DIAGNOSIS — F3130 Bipolar disorder, current episode depressed, mild or moderate severity, unspecified: Secondary | ICD-10-CM | POA: Diagnosis not present

## 2023-12-09 DIAGNOSIS — E785 Hyperlipidemia, unspecified: Secondary | ICD-10-CM | POA: Diagnosis not present

## 2023-12-10 ENCOUNTER — Other Ambulatory Visit: Payer: Self-pay | Admitting: Nurse Practitioner

## 2023-12-15 DIAGNOSIS — I890 Lymphedema, not elsewhere classified: Secondary | ICD-10-CM | POA: Diagnosis not present

## 2023-12-15 DIAGNOSIS — C159 Malignant neoplasm of esophagus, unspecified: Secondary | ICD-10-CM | POA: Diagnosis not present

## 2023-12-15 DIAGNOSIS — N1831 Chronic kidney disease, stage 3a: Secondary | ICD-10-CM | POA: Diagnosis not present

## 2023-12-15 DIAGNOSIS — Z6832 Body mass index (BMI) 32.0-32.9, adult: Secondary | ICD-10-CM | POA: Diagnosis not present

## 2023-12-15 DIAGNOSIS — F3130 Bipolar disorder, current episode depressed, mild or moderate severity, unspecified: Secondary | ICD-10-CM | POA: Diagnosis not present

## 2023-12-15 DIAGNOSIS — K5900 Constipation, unspecified: Secondary | ICD-10-CM | POA: Diagnosis not present

## 2023-12-15 DIAGNOSIS — I10 Essential (primary) hypertension: Secondary | ICD-10-CM | POA: Diagnosis not present

## 2023-12-15 DIAGNOSIS — R1310 Dysphagia, unspecified: Secondary | ICD-10-CM | POA: Diagnosis not present

## 2023-12-19 ENCOUNTER — Inpatient Hospital Stay: Attending: Oncology

## 2023-12-19 ENCOUNTER — Ambulatory Visit
Admission: RE | Admit: 2023-12-19 | Discharge: 2023-12-19 | Disposition: A | Discharge status: Home / Self Care | Source: Ambulatory Visit | Attending: Radiation Oncology | Admitting: Radiation Oncology

## 2023-12-19 DIAGNOSIS — C160 Malignant neoplasm of cardia: Secondary | ICD-10-CM

## 2023-12-19 NOTE — Progress Notes (Addendum)
 Nutrition Follow-up:  Patient with gastroesophageal cancer.  Followed by Dr Cloretta.  Completed concurrent chemotherapy (10/8) and radiation on 10/10.  PEG placed on 9/23 due to dysphagia, odynophagia.    Spoke with patient via phone.  Reports that she has been able to eat oatmeal, soups, ensure/boost shakes, yogurt, pudding, mashed potatoes with gravy.  Says that her pain with swallowing is better and reflux is better.  Tried chicken that she was feeding to her cat but was unable to eat it without it feeling like it stacked up.  Reports last bowel movement on Saturday. Reports that she has 3 cartons of [employer] Peptide left.    Has been giving 3 cartons of [employer] Peptide 1.5 usually 1 carton at 11am, 3pm and 7pm.  Has been giving 20 oz of water at each feeding.  Feels nauseated after feeding.   Medications: reviewed  Labs: reviewed  Anthropometrics:   Weight 176 lb 14.4 oz on 10/29 179 lb on 10/8 181 lb 4.8 oz on 10/1 179 lb on 9/29 184 lb on 9/24 187 lb on 9/15 193 lb on 9/10   Estimated Energy Needs  Kcals: 8199-7874 Protein: 85-102 g Fluid: >  NUTRITION DIAGNOSIS: Inadequate oral intake improving   INTERVENTION:  Continue [employer] peptide, 1.5, 3 cartons daily via feeding tube (11am, 3pm and 7pm).  Recommend flush of 60ml (1 syringes) before and after each feeding. Encourage at least additional 4 cups of fluid either drink orally or give via feeding tube.  Discussed via phone and emailed written schedule Provides 1500 calories, 72 g protein, 2001 ml free water Provided list of soft, moist foods to try orally. Continue ensure/boost shakes orally for added nutrition 1-2 a day (350 calorie/shake)    MONITORING, EVALUATION, GOAL: weight trends, intake, tube feeding   NEXT VISIT: Monday, Dec 1 phone call  Melysa Schroyer B. Dasie SOLON, CSO, LDN Registered Dietitian [phone]

## 2023-12-21 ENCOUNTER — Inpatient Hospital Stay: Admitting: Nurse Practitioner

## 2023-12-21 ENCOUNTER — Telehealth: Payer: Self-pay | Admitting: Nurse Practitioner

## 2023-12-21 NOTE — Telephone Encounter (Signed)
 Patient is having really bad cramps, wont be able to make it in.

## 2023-12-25 ENCOUNTER — Other Ambulatory Visit: Payer: Self-pay | Admitting: Radiation Oncology

## 2023-12-26 ENCOUNTER — Other Ambulatory Visit: Payer: Self-pay | Admitting: Radiation Oncology

## 2023-12-26 ENCOUNTER — Encounter: Payer: Self-pay | Admitting: Oncology

## 2023-12-26 ENCOUNTER — Encounter: Payer: Self-pay | Admitting: Internal Medicine

## 2023-12-27 ENCOUNTER — Encounter: Payer: Self-pay | Admitting: Oncology

## 2023-12-27 ENCOUNTER — Encounter: Payer: Self-pay | Admitting: Internal Medicine

## 2023-12-29 ENCOUNTER — Encounter: Payer: Self-pay | Admitting: Nurse Practitioner

## 2023-12-29 ENCOUNTER — Ambulatory Visit (HOSPITAL_COMMUNITY)
Admission: RE | Admit: 2023-12-29 | Discharge: 2023-12-29 | Disposition: A | Discharge status: Home / Self Care | Source: Ambulatory Visit | Attending: Nurse Practitioner | Admitting: Nurse Practitioner

## 2023-12-29 ENCOUNTER — Inpatient Hospital Stay: Attending: Radiation Oncology | Admitting: Nurse Practitioner

## 2023-12-29 ENCOUNTER — Other Ambulatory Visit: Payer: Self-pay

## 2023-12-29 VITALS — BP 137/80 | HR 83 | Temp 97.7°F | Resp 18 | Ht 61.0 in | Wt 179.6 lb

## 2023-12-29 DIAGNOSIS — Z923 Personal history of irradiation: Secondary | ICD-10-CM | POA: Insufficient documentation

## 2023-12-29 DIAGNOSIS — Z9221 Personal history of antineoplastic chemotherapy: Secondary | ICD-10-CM | POA: Insufficient documentation

## 2023-12-29 DIAGNOSIS — C160 Malignant neoplasm of cardia: Secondary | ICD-10-CM

## 2023-12-29 DIAGNOSIS — Z86718 Personal history of other venous thrombosis and embolism: Secondary | ICD-10-CM | POA: Diagnosis not present

## 2023-12-29 HISTORY — PX: IR RADIOLOGIST EVAL & MGMT: IMG5224

## 2023-12-29 MED ORDER — CEPHALEXIN 500 MG PO CAPS
500.0000 mg | ORAL_CAPSULE | Freq: Two times a day (BID) | ORAL | 0 refills | Status: AC
Start: 2023-12-29 — End: 2024-01-05

## 2023-12-29 NOTE — Progress Notes (Signed)
  [facility] OFFICE PROGRESS NOTE   Diagnosis: Gastroesophageal cancer  INTERVAL HISTORY:   [name] returns for follow-up.  She has noted significant improvement in dysphagia/odynophagia over the past week.  She describes swallowing as much better.  She is tolerating soft solids.  She is concerned the feeding tube site is infected.  She notes drainage and has pain at the site.  No fever or chills.  Objective:  Vital signs in last 24 hours:  Blood pressure 137/80, pulse 83, temperature 97.7 F (36.5 C), temperature source Temporal, resp. rate 18, height 5' 1 (1.549 m), weight 179 lb 9.6 oz (81.5 kg), SpO2 98%.    HEENT: No thrush or ulcers. Resp: Lungs clear bilaterally. Cardio: Regular rate and rhythm. GI: No hepatosplenomegaly.  Left upper quadrant feeding tube site with mild surrounding erythema, typical appearing drainage.   Vascular: No leg edema.   Lab Results:  Lab Results  Component Value Date   WBC 6.8 12/07/2023   HGB 12.3 12/07/2023   HCT 36.4 12/07/2023   MCV 88.8 12/07/2023   PLT 277 12/07/2023   NEUTROABS 5.3 12/07/2023    Imaging:  No results found.  Medications: I have reviewed the patient's current medications.  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  Non-hypermetabolic right middle lobe perifissural nodule below PET resolution and stable  from prior, right subcarinal and low paratracheal lymph nodes suspicious for metastases, focal soft tissue uptake adjacent to the right ischium without CT correlate Concurrent chemoradiation starting 10/12/2023 Cycle 1 day 1 carboplatin /Taxol  10/14/2023 Cycle 1 day 8 carboplatin /Taxol  10/19/2023 Cycle 1 day 15 carboplatin /Taxol  10/26/2023 Cycle 1 day 22 carboplatin /Taxol  11/02/2023 Cycle 1 day 29 carboplatin /Taxol  11/09/2023 Cycle 1 day 36 carboplatin /Taxol  11/16/2023 Radiation completed 11/18/2023 COPD 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  History of tobacco and alcohol  use, none at present 11.   Alcoholism 12.  Solid dysphagia and anterior chest pain secondary to #1 13.  Odynophagia secondary to radiation: Supra fate added 10/26/2023 14. Hypokalemia secondary to GI loss and diuretic   Disposition: [name] appears stable.  Dysphagia/odynophagia significantly improved over the past week, now tolerating soft solids.  Plan for restaging chest CT 01/09/2024.  Referral placed to Dr. Rollin for restaging upper endoscopy.  The feeding tube site is without signs of obvious infection.  We will request IR evaluation.  She will return for follow-up as scheduled 01/16/2024.  We are available to see her sooner if needed.    Olam Ned ANP/GNP-BC   12/29/2023  1:52 PM

## 2023-12-29 NOTE — Progress Notes (Signed)
 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 [name] presents to IR today for evaluation of leaking g-tube and concerns for site infection.  [name] reports significant drainage from the g-tube insertion site daily which has been white, yellow, brown and green at times. [name] has been using approximately ~pack of gauze underneath the bumper to help with leakage which [name] changes multiple times per day. The tube is working well otherwise, no issues with flushing or feeds. The site is somewhat tender but more so on the inside of her abdomen than the surface, particularly with movement, and [name] feels this has been worsening over the last few days although is not severe and does not limit her usual activities. [name] describes it as stinging and knowing the tube is there all the time. [name] denies fevers, chills or significant abdominal pain. In fact, [name] has actually been feeling quite good this last week since her swallowing has significantly improved.   Allergies: Lisinopril, Lithium, Statins, Adhesive [tape], Codeine, and Scopolamine   Medications: Prior to Admission medications   Medication Sig Start Date End Date Taking? Authorizing Provider  cephALEXin (KEFLEX) 500 MG capsule Take 1 capsule (500 mg total) by mouth every 12 (twelve) hours for 7 days. 12/29/23 01/05/24 Yes Neita Kirsch A, PA-C  ALPRAZolam  (XANAX ) 1 MG tablet Take 1 mg by mouth See admin instructions. Take 1 tablet (1 mg) by mouth scheduled twice daily, may take an additional dose in the afternoon, if needed for anxiety.    [provider]  ammonium lactate (LAC-HYDRIN)  12 % lotion Apply 1 Application topically 2 (two) times daily. 10/07/23   [provider]  ARIPiprazole  (ABILIFY ) 30 MG tablet Take 30 mg by mouth at bedtime. Patient not taking: Reported on 12/07/2023 05/04/16   [provider]  butalbital -acetaminophen -caffeine  (FIORICET , ESGIC ) 50-325-40 MG per tablet Take 2 tablets by mouth every 4 (four) hours as needed for headache or migraine.     [provider]  chlorpheniramine (CHLOR-TRIMETON) 4 MG tablet Take 8 mg by mouth at bedtime. Patient not taking: Reported on 12/07/2023    [provider]  cyclobenzaprine  (FLEXERIL ) 10 MG tablet Take 10 mg by mouth at bedtime.    [provider]  cycloSPORINE  (RESTASIS ) 0.05 % ophthalmic emulsion Place 1 drop into both eyes 2 (two) times daily.    [provider]  dexlansoprazole  (DEXILANT ) 60 MG capsule Take 1 capsule (60 mg total) by mouth daily. Patient not taking: Reported on 12/07/2023 10/21/23   Dewey Rush, MD  diphenoxylate-atropine (LOMOTIL) 2.5-0.025 MG tablet Take 1-2 tablets by mouth 4 (four) times daily as needed for diarrhea or loose stools (maximum 8/day). 11/16/23   [name], MD  escitalopram (LEXAPRO) 5 MG/5ML solution Take 10 mg by mouth daily. 11/28/23   [provider]  ezetimibe  (ZETIA ) 10 MG tablet Take 10 mg by mouth at bedtime.    [provider]  fenofibrate (TRICOR) 145 MG tablet Take 145 mg by mouth in the morning. Patient not taking: Reported on 12/07/2023    [provider]  fluticasone  (FLONASE ) 50 MCG/ACT nasal spray Place 2 sprays into both nostrils 2 (two) times daily. 12/05/13  Shelah Lamar RAMAN, MD  HYDROmorphone  (DILAUDID ) 2 MG tablet Take 2 mg by mouth in the morning, at noon, and at bedtime. 08/23/19   [provider]  lamoTRIgine  (LAMICTAL ) 200 MG tablet Take 200 mg by mouth at bedtime.    [provider]  lidocaine  (XYLOCAINE ) 2 % solution Use as directed 15 mLs in the mouth  or throat every 6 (six) hours as needed. 11/08/23   [provider]  linaclotide LARUE) 290 MCG CAPS capsule Take 290 mcg by mouth daily before breakfast. Patient not taking: Reported on 12/07/2023    [provider]  lisdexamfetamine (VYVANSE ) 50 MG capsule Take 50 mg by mouth daily after breakfast.    [provider]  loperamide (IMODIUM A-D) 2 MG tablet Take 2-4 mg by mouth 4 (four) times daily as needed for diarrhea or loose stools.    [provider]  meclizine  (ANTIVERT ) 25 MG tablet Take 25 mg by mouth 3 (three) times daily as needed for dizziness.     [provider]  Melatonin 12 MG TABS Take 12 mg by mouth at bedtime. Patient not taking: Reported on 12/07/2023    [provider]  Nutritional Supplements (KATE FARMS PED PEPTIDE 1.5) LIQD Give 1 carton via bolus syringe 4 times a day in feeding tube (8am, noon, 4pm and 8pm).   Flush with 60ml of water before and after each feeding. Give additional 240ml TID between feedings for hydration. 11/21/23   [name], MD  ondansetron  (ZOFRAN ) 8 MG tablet Take 1 tablet (8 mg total) by mouth every 8 (eight) hours as needed. May start taking 72 hours after each chemotherapy treatment 12/05/23   [name], MD  Polyethyl Glycol-Propyl Glycol (SYSTANE OP) Place 1 drop into both eyes 3 (three) times daily as needed (dry/irritated eyes.).    [provider]  potassium chloride 20 MEQ/15ML (10%) SOLN Take 15 mLs (20 mEq total) by mouth 2 (two) times daily. Per tube 11/09/23   Burton, Lacie K, NP  pregabalin  (LYRICA ) 300 MG capsule Take 300 mg by mouth in the morning and at bedtime.    [provider]  prochlorperazine  (COMPAZINE ) 5 MG tablet Take 1 tablet (5 mg total) by mouth every 6 (six) hours as needed for nausea or vomiting. 11/16/23   [name], MD  sucralfate  (CARAFATE ) 1 GM/10ML suspension TAKE TWO TEASPOONFULS ( ) BY MOUTH FOUR TIMES DAILY 11/22/23    [name], MD  torsemide (DEMADEX) 20 MG tablet Take 20 mg by mouth 4 (four) times daily as needed (fluid retention.).    Curt Myla SAILOR, RN     Vital Signs: There were no vitals taken for this visit.  Physical Exam Constitutional:      General: [name] is not in acute distress. Pulmonary:     Effort: Pulmonary effort is normal.  Abdominal:     General: There is no distension.     Palpations: Abdomen is soft.     Tenderness: There is no abdominal tenderness.     Comments: (+) G tube with bumper several inches from the skin surface.   Existing dressings removed, small amount of brown dried fluid present. Insertion site with very mild erythema, unable to express pus from tract - very small amount of white fluid noted deeper within the tract but is not able to be expressed.   Internal bumper was pulled tightly against inner stomach wall, external bumper cinched snugly to skin. Tube flushed with 20 cc  normal saline without discomfort or leaking.  Skin:    General: Skin is warm and dry.  Neurological:     Mental Status: [name] is alert.     Imaging: No results found.  Labs:  CBC: Recent Labs    11/02/23 1120 11/09/23 1012 11/16/23 1012 12/07/23 1341  WBC 5.7 5.4 4.4 6.8  HGB 11.2* 11.4* 11.4* 12.3  HCT 32.8* 32.5* 33.8* 36.4  PLT 243 217 222 277    COAGS: Recent Labs    11/01/23 0917  INR 1.0    BMP: Recent Labs    11/02/23 1120 11/09/23 1012 11/16/23 1012 12/07/23 1341  NA 136 135 136 138  K 3.3* 3.0* 3.3* 3.5  CL 100 94* 95* 101  CO2 22 26 27 27   GLUCOSE 127* 106* 77 100*  BUN 16 10 11 15   CALCIUM  9.4 9.2 9.3 9.1  CREATININE 0.57 0.80 0.62 0.71  GFRNONAA >60 >60 >60 >60    LIVER FUNCTION TESTS: Recent Labs    11/02/23 1120 11/09/23 1012 11/16/23 1012 12/07/23 1341  BILITOT 0.4 0.3 0.3 0.3  AST 12* 16 16 21   ALT 6 6 7 7   ALKPHOS 59 58 65 101  PROT 6.5 6.3* 6.4* 6.0*  ALBUMIN  3.7 3.3* 3.4* 3.5    Assessment and Plan:  78 y/o F with  history of GEJ cancer s/p radiation and chemotherapy s/p 18 Fr pull through g-tube placement 11/01/23 in IR (Dr. Jenna) who presents today for evaluation of leaking g tube and concern for site infection.  Exam today shows external bumper several inches from skin with many pieces of gauze between the bumper and skin which is likely the cause of the continued leakage. After cinching bumper appropriately to skin and flushing with 20 cc normal saline, no leakage was observed. Education provided to patient to keep bumper cinched snugly to skin, limit gauze to 1 piece between skin and bumper if necessary - if there is no leakage/discomfort patient instructed to not routinely put gauze between bumper and skin to prevent skin breakdown from retained moisture.  On exam today there are no overt signs of site infection - however the patient describes seeing drainage that varies in color from white to brown to green with reportedly increasing discomfort/stinging sensation along the tract itself. Discussed with patient that I do not feel currently that the site is infected however given the reported history and patient concern, will send in Keflex 500 mg PO Q12H x 7 days with the instructions for patient to hold off on starting antibiotics for 2 days to see if symptoms improve with cessation of leakage from site. [name] understands and is in agreement with this plan.  May continue to use g-tube as usual. Call IR with any further questions or concerns.  Electronically Signed: Clotilda DELENA Hesselbach, PA-C 12/29/2023, 3:41 PM   I spent a total of 15 Minutes at the the patient's bedside AND on the patient's hospital floor or unit, greater than 50% of which was counseling/coordinating care for leaking gastrostomy tube.

## 2023-12-30 ENCOUNTER — Other Ambulatory Visit: Payer: Self-pay | Admitting: Gastroenterology

## 2023-12-30 ENCOUNTER — Telehealth: Payer: Self-pay

## 2023-12-30 ENCOUNTER — Telehealth: Payer: Self-pay | Admitting: Nurse Practitioner

## 2023-12-30 NOTE — Telephone Encounter (Signed)
 Nutrition  Patient called to change nutrition appointment on 12/1 to 12/11.  Appointments changed.  Renie Stelmach B. Dasie SOLON, CSO, LDN Registered Dietitian [phone]

## 2024-01-08 DIAGNOSIS — F3130 Bipolar disorder, current episode depressed, mild or moderate severity, unspecified: Secondary | ICD-10-CM | POA: Diagnosis not present

## 2024-01-08 DIAGNOSIS — E785 Hyperlipidemia, unspecified: Secondary | ICD-10-CM | POA: Diagnosis not present

## 2024-01-08 DIAGNOSIS — I10 Essential (primary) hypertension: Secondary | ICD-10-CM | POA: Diagnosis not present

## 2024-01-09 ENCOUNTER — Encounter (HOSPITAL_COMMUNITY)
Admission: RE | Disposition: A | Discharge status: Home / Self Care | Payer: Self-pay | Source: Home / Self Care | Attending: Gastroenterology

## 2024-01-09 ENCOUNTER — Ambulatory Visit (HOSPITAL_BASED_OUTPATIENT_CLINIC_OR_DEPARTMENT_OTHER)

## 2024-01-09 ENCOUNTER — Other Ambulatory Visit: Payer: Self-pay | Admitting: Interventional Radiology

## 2024-01-09 ENCOUNTER — Ambulatory Visit (HOSPITAL_COMMUNITY)
Admission: RE | Admit: 2024-01-09 | Discharge: 2024-01-09 | Disposition: A | Discharge status: Home / Self Care | Source: Ambulatory Visit | Attending: Urology | Admitting: Urology

## 2024-01-09 ENCOUNTER — Ambulatory Visit (HOSPITAL_COMMUNITY): Payer: Self-pay | Admitting: Anesthesiology

## 2024-01-09 ENCOUNTER — Ambulatory Visit (HOSPITAL_COMMUNITY)
Admission: RE | Admit: 2024-01-09 | Discharge: 2024-01-09 | Disposition: A | Discharge status: Home / Self Care | Attending: Gastroenterology | Admitting: Gastroenterology

## 2024-01-09 ENCOUNTER — Other Ambulatory Visit: Payer: Self-pay

## 2024-01-09 ENCOUNTER — Inpatient Hospital Stay

## 2024-01-09 ENCOUNTER — Encounter (HOSPITAL_COMMUNITY): Payer: Self-pay | Admitting: Gastroenterology

## 2024-01-09 DIAGNOSIS — J449 Chronic obstructive pulmonary disease, unspecified: Secondary | ICD-10-CM | POA: Diagnosis not present

## 2024-01-09 DIAGNOSIS — K2289 Other specified disease of esophagus: Secondary | ICD-10-CM | POA: Diagnosis not present

## 2024-01-09 DIAGNOSIS — F418 Other specified anxiety disorders: Secondary | ICD-10-CM

## 2024-01-09 DIAGNOSIS — Z87891 Personal history of nicotine dependence: Secondary | ICD-10-CM | POA: Diagnosis not present

## 2024-01-09 DIAGNOSIS — Z8501 Personal history of malignant neoplasm of esophagus: Secondary | ICD-10-CM | POA: Diagnosis not present

## 2024-01-09 DIAGNOSIS — K317 Polyp of stomach and duodenum: Secondary | ICD-10-CM

## 2024-01-09 DIAGNOSIS — Z86718 Personal history of other venous thrombosis and embolism: Secondary | ICD-10-CM | POA: Diagnosis not present

## 2024-01-09 DIAGNOSIS — C160 Malignant neoplasm of cardia: Secondary | ICD-10-CM

## 2024-01-09 DIAGNOSIS — K208 Other esophagitis without bleeding: Secondary | ICD-10-CM | POA: Diagnosis not present

## 2024-01-09 DIAGNOSIS — I739 Peripheral vascular disease, unspecified: Secondary | ICD-10-CM | POA: Diagnosis not present

## 2024-01-09 HISTORY — PX: ESOPHAGOGASTRODUODENOSCOPY: SHX5428

## 2024-01-09 HISTORY — PX: IR RADIOLOGIST EVAL & MGMT: IMG5224

## 2024-01-09 SURGERY — EGD (ESOPHAGOGASTRODUODENOSCOPY)
Anesthesia: Monitor Anesthesia Care

## 2024-01-09 MED ORDER — SODIUM CHLORIDE 0.9 % IV SOLN: INTRAVENOUS | Status: DC | PRN

## 2024-01-09 MED ORDER — PROPOFOL 500 MG/50ML IV EMUL
INTRAVENOUS | Status: DC | PRN
  Administered 2024-01-09: 100 ug/kg/min via INTRAVENOUS

## 2024-01-09 MED ORDER — LIDOCAINE HCL (PF) 2 % IJ SOLN
INTRAMUSCULAR | Status: DC | PRN
  Administered 2024-01-09: 60 mg via INTRADERMAL

## 2024-01-09 NOTE — H&P (Signed)
 [name] HPI: The patient is here for follow up of her adenocarcinoma of the distal esophagus.  She received chemotherapy and radiation.  Her dysphagia improved.  Past Medical History:  Diagnosis Date   Anemia    Anesthesia complication    woke up during back surgery at [facility] 2020   Anxiety    Bipolar affective disorder (HCC)    Cancer (HCC)    skin cancer   Chronic kidney disease    stage 3 kidney disease   COPD (chronic obstructive pulmonary disease) (HCC)    Cough    Depression    DJD (degenerative joint disease) of lumbar spine    DVT (deep venous thrombosis) (HCC) 2010   groin left - on coumadin for 6 months   Family history of anesthesia complication    Hx: father has nausea   Fibromyalgia    GERD (gastroesophageal reflux disease)    Left leg DVT (HCC) 2010   Lymphedema 2019   Migraine    Peripheral vascular disease    Pneumonia    PONV (postoperative nausea and vomiting)    and migraines   Pre-diabetes    Shortness of breath    exertion   Spinal cord stimulator dysfunction    has one in,but not working   Spinal headache    Vertigo    Hx: of   Vocal cord dysfunction    sees dr. shelah    Past Surgical History:  Procedure Laterality Date   ABDOMINAL HYSTERECTOMY  1980   partial    ANTERIOR LAT LUMBAR FUSION Left 02/15/2014   Procedure: ANTERIOR LATERAL LUMBAR INTERBODY FUSION LUMBAR TWO-THREE,LUMBAR THREE-FOUR WITH LATERAL PLATE.;  Surgeon: Victory DELENA Gunnels, MD;  Location: [facility];  Service: Neurosurgery;  Laterality: Left;  left    APPENDECTOMY     BACK SURGERY     BLEPHAROPLASTY     bilateral   BREAST ENHANCEMENT SURGERY  1989   CARPAL TUNNEL RELEASE  2006   Right hand   CATARACT EXTRACTION W/ INTRAOCULAR LENS  IMPLANT, BILATERAL     Hx: of   CERVICAL FUSION  2005   CHOLECYSTECTOMY  1989   COLONOSCOPY W/ BIOPSIES AND POLYPECTOMY     Hx: of   cyst removed from right thumb      DENTAL RESTORATION/EXTRACTION WITH X-RAY     16 teeth  removed due to osteonecrosis due to fosamax   ESOPHAGOGASTRODUODENOSCOPY N/A 09/13/2023   Procedure: EGD (ESOPHAGOGASTRODUODENOSCOPY);  Surgeon: Rollin Dover, MD;  Location: [facility];  Service: Gastroenterology;  Laterality: N/A;   eyebrow and eyelid lift      2024   FINGER ARTHROSCOPY WITH CARPOMETACARPEL ([facility]) ARTHROPLASTY     thumb   FRACTURE SURGERY Right 09/2013   ankle and leg - plate and screws   Fusion of third joint of index finger on right hand      IR GASTROSTOMY TUBE MOD SED  11/01/2023   IR RADIOLOGIST EVAL & MGMT  12/29/2023   lipoma removed right elbow      lumbar arthrodesis      LUMBAR LAMINECTOMY/DECOMPRESSION MICRODISCECTOMY Right 11/06/2012   Procedure: LUMBAR TWO THREE LUMBAR LAMINECTOMY/DECOMPRESSION MICRODISCECTOMY 1 LEVEL;  Surgeon: Victory DELENA Gunnels, MD;  Location: [facility];  Service: Neurosurgery;  Laterality: Right;   OPEN REDUCTION INTERNAL FIXATION (ORIF) DISTAL RADIAL FRACTURE Right 05/05/2018   Procedure: OPEN REDUCTION INTERNAL FIXATION (ORIF) DISTAL RADIAL FRACTURE;  Surgeon: Sissy Cough, MD;  Location: [facility];  Service: Orthopedics;  Laterality: Right;   ORIF ANKLE FRACTURE Right 2015   ORIF TIBIA FRACTURE Right 2015   PARTIAL HYSTERECTOMY  1980   recurrent right ankle surgery      right ankle surgery      right wrist surgery      with plates and screws   ROTATOR CUFF REPAIR Right 2017   SHOULDER ARTHROSCOPY     SPINAL CORD STIMULATOR BATTERY EXCHANGE Right 09/09/2017   Procedure: SPINAL CORD STIMULATOR BATTERY EXCHANGE;  Surgeon: Mindi Mt, MD;  Location[facility];  Service: Neurosurgery;  Laterality: Right;  SPINAL CORD STIMULATOR BATTERY EXCHANGE   SPINAL CORD STIMULATOR IMPLANT  2010   SPINAL FUSION  2018   L1, L2   TARSAL SUSPENSION     Hx; of left thumb   TONSILLECTOMY  1955   UPPER GI ENDOSCOPY     Hx: of   WISDOM TOOTH EXTRACTION      Family History  Problem Relation Age of Onset   Cancer Mother         lung   Lung cancer Mother    Cancer Father    Prostate cancer Father    Heart disease Father    Asthma Sister    Rheum arthritis Paternal Grandmother     Social History:  reports that she quit smoking about 37 years ago. Her smoking use included cigarettes. She started smoking about 58 years ago. She has a 52.5 pack-year smoking history. She has never used smokeless tobacco. She reports that she does not currently use alcohol . She reports that she does not currently use drugs.  Allergies:  Allergies  Allergen Reactions   Lisinopril Other (See Comments)    kidney failure   Lithium Other (See Comments)    Migraines and drunk   Statins Other (See Comments)    Very weak and achy   Adhesive [Tape] Rash    All tape, paper and steri-strips included   Codeine Nausea And Vomiting   Scopolamine  Other (See Comments)    Causes vertigo    Medications: Scheduled: Continuous:  No results found for this or any previous visit (from the past 24 hours).   No results found.  ROS:  As stated above in the HPI otherwise negative.  Blood pressure (!) 144/81, pulse 66, temperature 98 F (36.7 C), temperature source Temporal, resp. rate 10, height 5' 1 (1.549 m), weight 79.4 kg, SpO2 99%.    PE: Gen: NAD, Alert and Oriented HEENT:  [place]/AT, EOMI Neck: Supple, no LAD Lungs: CTA Bilaterally CV: RRR without M/G/R ABD: Soft, NTND, +BS Ext: No C/C/E  Assessment/Plan: 1) Adenocarcinoma of the GE junction - Repeat EGD with biopsies.  Irmalee Riemenschneider [name] 01/09/2024, 7:20 AM

## 2024-01-09 NOTE — Anesthesia Procedure Notes (Signed)
 Procedure Name: MAC Date/Time: 01/09/2024 7:34 AM  Performed by: Arvell Edsel HERO, CRNAPre-anesthesia Checklist: Patient identified, Emergency Drugs available, Suction available, Patient being monitored and Timeout performed Patient Re-evaluated:Patient Re-evaluated prior to induction Oxygen Delivery Method: Simple face mask

## 2024-01-09 NOTE — Discharge Instructions (Signed)
 YOU HAD AN ENDOSCOPIC PROCEDURE TODAY: Refer to the procedure report and other information in the discharge instructions given to you for any specific questions about what was found during the examination. If this information does not answer your questions, please call [facility] at [phone] to clarify.   YOU SHOULD EXPECT: Some feelings of bloating in the abdomen. Passage of more gas than usual. Walking can help get rid of the air that was put into your GI tract during the procedure and reduce the bloating. If you had a lower endoscopy (such as a colonoscopy or flexible sigmoidoscopy) you may notice spotting of blood in your stool or on the toilet paper. Some abdominal soreness may be present for a day or two, also.  DIET: Your first meal following the procedure should be a light meal and then it is ok to progress to your normal diet. A half-sandwich or bowl of soup is an example of a good first meal. Heavy or fried foods are harder to digest and may make you feel nauseous or bloated. Drink plenty of fluids but you should avoid alcoholic beverages for 24 hours. If you had an esophageal dilation, please see attached information for diet.   ACTIVITY: Your care partner should take you home directly after the procedure. You should plan to take it easy, moving slowly for the rest of the day. You can resume normal activity the day after the procedure however YOU SHOULD NOT DRIVE, use power tools, machinery or perform tasks that involve climbing or major physical exertion for 24 hours (because of the sedation medicines used during the test).   SYMPTOMS TO REPORT IMMEDIATELY: A gastroenterologist can be reached at any hour. Please call [phone]  for any of the following symptoms:  Following lower endoscopy (colonoscopy, flexible sigmoidoscopy) Excessive amounts of blood in the stool  Significant tenderness, worsening of abdominal pains  Swelling of the abdomen that is new, acute  Fever of  100 or higher  Following upper endoscopy (EGD, EUS, ERCP, esophageal dilation) Vomiting of blood or coffee ground material  New, significant abdominal pain  New, significant chest pain or pain under the shoulder blades  Painful or persistently difficult swallowing  New shortness of breath  Black, tarry-looking or red, bloody stools  FOLLOW UP:  If any biopsies were taken you will be contacted by phone or by letter within the next 1-3 weeks. Call [phone]  if you have not heard about the biopsies in 3 weeks.  Please also call with any specific questions about appointments or follow up tests. YOU HAD AN ENDOSCOPIC PROCEDURE TODAY: Refer to the procedure report and other information in the discharge instructions given to you for any specific questions about what was found during the examination. If this information does not answer your questions, please call [facility] at [phone] to clarify.   YOU SHOULD EXPECT: Some feelings of bloating in the abdomen. Passage of more gas than usual. Walking can help get rid of the air that was put into your GI tract during the procedure and reduce the bloating. If you had a lower endoscopy (such as a colonoscopy or flexible sigmoidoscopy) you may notice spotting of blood in your stool or on the toilet paper. Some abdominal soreness may be present for a day or two, also.  DIET: Your first meal following the procedure should be a light meal and then it is ok to progress to your normal diet. A half-sandwich or bowl of soup is an example  of a good first meal. Heavy or fried foods are harder to digest and may make you feel nauseous or bloated. Drink plenty of fluids but you should avoid alcoholic beverages for 24 hours. If you had an esophageal dilation, please see attached information for diet.   ACTIVITY: Your care partner should take you home directly after the procedure. You should plan to take it easy, moving slowly for the rest of the day. You  can resume normal activity the day after the procedure however YOU SHOULD NOT DRIVE, use power tools, machinery or perform tasks that involve climbing or major physical exertion for 24 hours (because of the sedation medicines used during the test).   SYMPTOMS TO REPORT IMMEDIATELY: A gastroenterologist can be reached at any hour. Please call [phone]  for any of the following symptoms:  Following lower endoscopy (colonoscopy, flexible sigmoidoscopy) Excessive amounts of blood in the stool  Significant tenderness, worsening of abdominal pains  Swelling of the abdomen that is new, acute  Fever of 100 or higher  Following upper endoscopy (EGD, EUS, ERCP, esophageal dilation) Vomiting of blood or coffee ground material  New, significant abdominal pain  New, significant chest pain or pain under the shoulder blades  Painful or persistently difficult swallowing  New shortness of breath  Black, tarry-looking or red, bloody stools  FOLLOW UP:  If any biopsies were taken you will be contacted by phone or by letter within the next 1-3 weeks. Call [phone]  if you have not heard about the biopsies in 3 weeks.  Please also call with any specific questions about appointments or follow up tests.

## 2024-01-09 NOTE — Transfer of Care (Signed)
 Immediate Anesthesia Transfer of Care Note  Patient: Katie Meth  Procedure(s) Performed: EGD (ESOPHAGOGASTRODUODENOSCOPY)  Patient Location: Endoscopy Unit  Anesthesia Type:MAC  Level of Consciousness: drowsy and patient cooperative  Airway & Oxygen Therapy: Patient Spontanous Breathing and Patient connected to face mask oxygen  Post-op Assessment: Report given to RN, Post -op Vital signs reviewed and stable, Patient moving all extremities, and Patient moving all extremities X 4  Post vital signs: Reviewed and stable  Last Vitals:  Vitals Value Taken Time  BP 120/60 01/09/24 07:52  Temp    Pulse 64 01/09/24 07:53  Resp 13 01/09/24 07:53  SpO2 100 % 01/09/24 07:53  Vitals shown include unfiled device data.  Last Pain:  Vitals:   01/09/24 0700  TempSrc: Temporal  PainSc: 5          Complications: No notable events documented.

## 2024-01-09 NOTE — Progress Notes (Signed)
 Patient ID: [name], female   DOB: [birth year], 78 y.o.   MRN: [id number] Patient presented to IR department today for G-tube site evaluation secondary to reports of leaking of a green substance from the site and some associated pain.  She has a history of gastroesophageal junction adenocarcinoma and an 37 French balloon retention G-tube placement on 11/01/2023.  On exam today the G-tube site appears clean and dry.  No evidence of edema/erythema or drainage at site at this time.  The outer disc is a little loose from the skin site with a small piece of gauze underneath.  Patient denies fever, nausea, vomiting.  She had endoscopy earlier today.  The balloon was deflated and 10 cc of new saline was placed into the balloon.  Tube was secured to skin site.  No evidence of leaking with G-tube injection.  Gastric contents noted with aspiration.  No overt signs of infection at site noted.  Patient was instructed to keep the outer disc taut to skin surface to prevent leaking at site.  1 layer of drain sponge gauze will be allowed under disc to prevent skin irritation.  Patient told to contact our service with any additional questions or concerns.

## 2024-01-09 NOTE — Anesthesia Postprocedure Evaluation (Signed)
 Anesthesia Post Note  Patient: [name]  Procedure(s) Performed: EGD (ESOPHAGOGASTRODUODENOSCOPY)     Patient location during evaluation: Endoscopy Anesthesia Type: MAC Level of consciousness: awake Pain management: pain level controlled Vital Signs Assessment: post-procedure vital signs reviewed and stable Respiratory status: spontaneous breathing, nonlabored ventilation and respiratory function stable Cardiovascular status: blood pressure returned to baseline and stable Postop Assessment: no apparent nausea or vomiting Anesthetic complications: no   No notable events documented.  Last Vitals:  Vitals:   01/09/24 0800 01/09/24 0810  BP: 137/74 (!) 144/62  Pulse: 65 66  Resp: 13 17  Temp:    SpO2: 97% 95%    Last Pain:  Vitals:   01/09/24 0754  TempSrc: Temporal  PainSc: 5                  Janes Colegrove P Alora Gorey

## 2024-01-09 NOTE — Op Note (Signed)
 [facility] Patient Name: [name] Procedure Date : 01/09/2024 MRN: [id number] Attending MD: Belvie Just , MD, [id number] Date of Birth: 1945/04/10 CSN: [id number] Age: 78 Admit Type: Outpatient Procedure:                Upper GI endoscopy Indications:              Surveillance for malignancy due to personal history                            of esophageal cancer Providers:                Belvie Just, MD, Almarie Masters, RN, [facility]                            [place], Technician, Edsel BIRCH, CRNA Referring MD:              Medicines:                Propofol  per Anesthesia Complications:            No immediate complications. Estimated Blood Loss:     Estimated blood loss: none. Procedure:                Pre-Anesthesia Assessment:                           - Prior to the procedure, a History and Physical                            was performed, and patient medications and                            allergies were reviewed. The patient's tolerance of                            previous anesthesia was also reviewed. The risks                            and benefits of the procedure and the sedation                            options and risks were discussed with the patient.                            All questions were answered, and informed consent                            was obtained. Prior Anticoagulants: The patient has                            taken no anticoagulant or antiplatelet agents. ASA                            Grade Assessment: III - A patient with severe  systemic disease. After reviewing the risks and                            benefits, the patient was deemed in satisfactory                            condition to undergo the procedure.                           - Sedation was administered by an anesthesia                            professional. Deep sedation was attained.                           After obtaining  informed consent, the endoscope was                            passed under direct vision. Throughout the                            procedure, the patient's blood pressure, pulse, and                            oxygen saturations were monitored continuously. The                            [id number]-H190 ([id number]) Olympus endoscope was introduced                            through the mouth, and advanced to the second part                            of duodenum. The upper GI endoscopy was                            accomplished without difficulty. The patient                            tolerated the procedure well. Scope In: Scope Out: Findings:      LA Grade D (one or more mucosal breaks involving at least 75% of       esophageal circumference) esophagitis with no bleeding was found 33 to       40 cm from the incisors.      Localized moderate mucosal changes characterized by altered texture were       found at the gastroesophageal junction. Biopsies were taken with a cold       forceps for histology.      Multiple 3 to 5 mm semi-sessile polyps with no bleeding and no stigmata       of recent bleeding were found in the gastric body.      The examined duodenum was normal.      At the GE junction there was evidence of abnormal mucosa and the area       was patent. Subjectively it did  not feel as firm as the index EGD.       Multiple cold biopsies were obtained. The distal esophagus exhibited       mucosal sloughing and an esophagitis consistent with the radiation       treatment. 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                           - Texture changed mucosa in the esophagus. Biopsied.                           - Multiple gastric polyps.                           - Normal examined duodenum. Recommendation:           - Patient has a contact number available for                            emergencies. The signs and symptoms of potential                             delayed complications were discussed with the                            patient. Return to normal activities tomorrow.                            Written discharge instructions were provided to the                            patient.                           - Resume regular diet.                           - Continue present medications.                           - Await pathology results. Procedure Code(s):        --- Professional ---                           [id number], Esophagogastroduodenoscopy, flexible,                            transoral; with biopsy, single or multiple Diagnosis Code(s):        --- Professional ---                           T66.XXXA, Radiation sickness, unspecified, initial                            encounter  K20.80, Other esophagitis without bleeding                           K22.89, Other specified disease of esophagus                           K31.7, Polyp of stomach and duodenum                           Z85.01, Personal history of malignant neoplasm of                            esophagus CPT copyright 2022 [employer]. All rights reserved. The codes documented in this report are preliminary and upon coder review may  be revised to meet current compliance requirements. Belvie Just, MD Belvie Just, MD 01/09/2024 7:58:57 AM This report has been signed electronically. Number of Addenda: 0

## 2024-01-09 NOTE — Anesthesia Preprocedure Evaluation (Addendum)
 Anesthesia Evaluation  Patient identified by MRN, date of birth, ID band Patient awake    Reviewed: Allergy & Precautions, NPO status , Patient's Chart, lab work & pertinent test results  History of Anesthesia Complications (+) PONV, POST - OP SPINAL HEADACHE and history of anesthetic complications  Airway Mallampati: II  TM Distance: >3 FB Neck ROM: Full    Dental  (+) Edentulous Upper, Edentulous Lower   Pulmonary COPD,  COPD inhaler, former smoker   Pulmonary exam normal        Cardiovascular + Peripheral Vascular Disease and + DVT  Normal cardiovascular exam     Neuro/Psych  Headaches PSYCHIATRIC DISORDERS Anxiety Depression Bipolar Disorder      GI/Hepatic ,,,(+)     substance abuse    Endo/Other  negative endocrine ROS    Renal/GU Renal disease     Musculoskeletal  (+) Arthritis ,  Fibromyalgia -, narcotic dependent  Abdominal  (+) + obese  Peds  Hematology negative hematology ROS (+)   Anesthesia Other Findings gastroesophageal cancer  Reproductive/Obstetrics                              Anesthesia Physical Anesthesia Plan  ASA: 3  Anesthesia Plan: MAC   Post-op Pain Management:    Induction:   PONV Risk Score and Plan: 4 or greater and Treatment may vary due to age or medical condition and Propofol  infusion  Airway Management Planned: Nasal Cannula  Additional Equipment:   Intra-op Plan:   Post-operative Plan:   Informed Consent: I have reviewed the patients History and Physical, chart, labs and discussed the procedure including the risks, benefits and alternatives for the proposed anesthesia with the patient or authorized representative who has indicated his/her understanding and acceptance.     Dental advisory given  Plan Discussed with: CRNA  Anesthesia Plan Comments:          Anesthesia Quick Evaluation

## 2024-01-10 ENCOUNTER — Encounter (HOSPITAL_COMMUNITY): Payer: Self-pay | Admitting: Gastroenterology

## 2024-01-11 ENCOUNTER — Ambulatory Visit (HOSPITAL_BASED_OUTPATIENT_CLINIC_OR_DEPARTMENT_OTHER)
Admission: RE | Admit: 2024-01-11 | Discharge: 2024-01-11 | Disposition: A | Source: Ambulatory Visit | Attending: Nurse Practitioner | Admitting: Nurse Practitioner

## 2024-01-11 DIAGNOSIS — R918 Other nonspecific abnormal finding of lung field: Secondary | ICD-10-CM | POA: Diagnosis not present

## 2024-01-11 DIAGNOSIS — J849 Interstitial pulmonary disease, unspecified: Secondary | ICD-10-CM | POA: Diagnosis not present

## 2024-01-11 DIAGNOSIS — C160 Malignant neoplasm of cardia: Secondary | ICD-10-CM | POA: Diagnosis not present

## 2024-01-11 LAB — POCT I-STAT CREATININE: Creatinine, Ser: 0.8 mg/dL (ref 0.44–1.00)

## 2024-01-11 MED ORDER — IOHEXOL 300 MG/ML  SOLN
100.0000 mL | Freq: Once | INTRAMUSCULAR | Status: AC | PRN
  Administered 2024-01-11: 75 mL via INTRAVENOUS

## 2024-01-11 MED ORDER — IOHEXOL 300 MG/ML  SOLN: 100.0000 mL | Freq: Once | INTRAMUSCULAR | Status: DC | PRN

## 2024-01-12 LAB — SURGICAL PATHOLOGY

## 2024-01-16 ENCOUNTER — Telehealth: Payer: Self-pay | Admitting: *Deleted

## 2024-01-16 ENCOUNTER — Encounter: Payer: Self-pay | Admitting: *Deleted

## 2024-01-16 ENCOUNTER — Inpatient Hospital Stay: Attending: Radiation Oncology | Admitting: Oncology

## 2024-01-16 NOTE — Progress Notes (Signed)
 Patient canceled her appointment today according to scheduler for weather. Sent scheduling message to schedule for 30 minute scan review appointment when available.

## 2024-01-16 NOTE — Telephone Encounter (Signed)
 Called Ms. Maynez to offer telephone visit with Dr. Cloretta tomorrow at 11:00. She declines saying Dr. Rollin just told her she is cancer free and her family comes in tomorrow for a week. She agrees to f/u end of December or early January. Scheduling message sent.

## 2024-01-17 ENCOUNTER — Other Ambulatory Visit: Payer: Self-pay

## 2024-01-19 ENCOUNTER — Inpatient Hospital Stay: Attending: Radiation Oncology

## 2024-01-19 NOTE — Progress Notes (Signed)
 Nutrition Follow-up:  Patient with gastroesophageal cancer.  Followed by Dr Cloretta.  Completed concurrent chemotherapy (10/8) and radiation on 10/10.  PEG placed on 9/23 due to dysphagia, odynophagia.    Spoke with patient via phone for follow-up.  Reports that she received news that she is cancer free from Dr Rollin (GI).  Has not followed up with Dr Cloretta yet.  Patient reports that she has been eating but food still feels like it is stacking up in her esophagus.  Has been eating oatmeal, french onion soup and potato soup, eggs, crab cakes, baked potato, green bean casserole, stuffing with gravy, popcorn.  Breads tend to stack up and she has to be careful with steak and chicken.  Has been drinking some ensure/boost shakes and other liquids without difficulty.   Has been giving 3 cartons of [employer] peptide 1.5 via feeding tube. Flushing with 60ml of water before and after each feeding.  Tolerating with well.    Noted IR visit on 12/1 with concerns of feeding tube.     Medications: reviewed  Labs: reviewed  Anthropometrics:   Weight 175 lb on 12/1 noted 176 lb 14.4 oz on 10/29 179 lb on 10/8 181 lb 4.8 oz on 10/1 179 lb on 9/29 184 lb on 9/24 187 lb on 9/15 193 lb on 9/10   Estimated Energy Needs  Kcals: [id number] Protein: 85-102 g Fluid: > 1800 ml  NUTRITION DIAGNOSIS: Inadequate oral intake improving   INTERVENTION:  Discussed tapering down tube feeding and increasing oral intake.   Recommend patient check weight weekly and if weight staying the same decrease 1 carton of tube feeding for the next week.  Check weight weekly and if continues to be stable decrease tube feeding by 1 carton. Continue this process until not giving any formula through tube.   Once formula has been stopped patient will still need to flush tube with at least 60 ml of water daily until the tube comes out.  Patient verbalized understanding.   Would recommend feeding tube removal once patient is  maintaining weight without using tube feeding formula for at least 3-4 weeks.   Encouraged drinking ensure/boost shakes for added nutrition Reviewed soft, moist foods for ease of swallowing.     MONITORING, EVALUATION, GOAL: weight trends, intake, tube feeding   NEXT VISIT: Monday, Jan 12, phone call  Daison Braxton B. Dasie SOLON, CSO, LDN Registered Dietitian [phone]

## 2024-01-20 ENCOUNTER — Other Ambulatory Visit: Payer: Self-pay

## 2024-01-25 ENCOUNTER — Telehealth: Payer: Self-pay | Admitting: Oncology

## 2024-01-25 NOTE — Telephone Encounter (Signed)
 Returning PT phone call to reschedule appt

## 2024-02-10 ENCOUNTER — Other Ambulatory Visit: Payer: Self-pay | Admitting: *Deleted

## 2024-02-10 ENCOUNTER — Encounter: Payer: Self-pay | Admitting: Oncology

## 2024-02-10 ENCOUNTER — Other Ambulatory Visit (HOSPITAL_BASED_OUTPATIENT_CLINIC_OR_DEPARTMENT_OTHER): Payer: Self-pay

## 2024-02-10 ENCOUNTER — Inpatient Hospital Stay: Attending: Oncology | Admitting: Oncology

## 2024-02-10 ENCOUNTER — Encounter: Payer: Self-pay | Admitting: Internal Medicine

## 2024-02-10 VITALS — BP 115/59 | HR 64 | Temp 97.8°F | Resp 18 | Ht 61.0 in | Wt 184.2 lb

## 2024-02-10 DIAGNOSIS — Z08 Encounter for follow-up examination after completed treatment for malignant neoplasm: Secondary | ICD-10-CM | POA: Insufficient documentation

## 2024-02-10 DIAGNOSIS — Z86718 Personal history of other venous thrombosis and embolism: Secondary | ICD-10-CM | POA: Insufficient documentation

## 2024-02-10 DIAGNOSIS — E876 Hypokalemia: Secondary | ICD-10-CM | POA: Diagnosis not present

## 2024-02-10 DIAGNOSIS — Z8501 Personal history of malignant neoplasm of esophagus: Secondary | ICD-10-CM | POA: Diagnosis present

## 2024-02-10 DIAGNOSIS — C160 Malignant neoplasm of cardia: Secondary | ICD-10-CM | POA: Diagnosis not present

## 2024-02-10 MED ORDER — COMIRNATY 30 MCG/0.3ML IM SUSY
0.3000 mL | PREFILLED_SYRINGE | Freq: Once | INTRAMUSCULAR | 0 refills | Status: AC
  Filled 2024-02-10: qty 0.3, 1d supply, fill #0

## 2024-02-10 NOTE — Progress Notes (Signed)
 Called [facility]--they will call patient to get her tube out one day next week.

## 2024-02-10 NOTE — Progress Notes (Signed)
 "  [facility]cer Center OFFICE PROGRESS NOTE   Diagnosis: Gastroesophageal cancer  INTERVAL HISTORY:   [name] returns for a scheduled visit.  She reports significant improvement in odynophagia and dysphagia.  She continues to have knee pain.  She would like to have a knee replacement.  She underwent a restaging upper endoscopy Dr. Rollin on 01/09/2024. She is no longer using the feeding tube.  She would like to have the feeding tube removed. Objective:  Vital signs in last 24 hours:  Blood pressure (!) 115/59, pulse 64, temperature 97.8 F (36.6 C), temperature source Temporal, resp. rate 18, height 5' 1 (1.549 m), weight 184 lb 3.2 oz (83.6 kg), SpO2 100%.    Lymphatics: No cervical, supraclavicular, axillary, or inguinal nodes Resp: Breath sounds, scattered end inspiratory coarse rhonchi, no respiratory distress Cardio: Regular rate and rhythm GI: No hepatosplenomegaly, left upper quadrant feeding tube site with erythema surrounding the skin exit Vascular: Leg edema   Lab Results:  Lab Results  Component Value Date   WBC 6.8 12/07/2023   HGB 12.3 12/07/2023   HCT 36.4 12/07/2023   MCV 88.8 12/07/2023   PLT 277 12/07/2023   NEUTROABS 5.3 12/07/2023    CMP  Lab Results  Component Value Date   NA 138 12/07/2023   K 3.5 12/07/2023   CL 101 12/07/2023   CO2 27 12/07/2023   GLUCOSE 100 (H) 12/07/2023   BUN 15 12/07/2023   CREATININE 0.80 01/11/2024   CALCIUM  9.1 12/07/2023   PROT 6.0 (L) 12/07/2023   ALBUMIN  3.5 12/07/2023   AST 21 12/07/2023   ALT 7 12/07/2023   ALKPHOS 101 12/07/2023   BILITOT 0.3 12/07/2023   GFRNONAA >60 12/07/2023   GFRAA >60 09/06/2017    Medications: I have reviewed the patient's current medications.   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  Non-hypermetabolic right middle lobe perifissural nodule below PET resolution and stable from prior, right subcarinal and low paratracheal lymph nodes suspicious for metastases, focal soft tissue uptake adjacent to the right ischium without CT correlate Concurrent chemoradiation starting 10/12/2023 Cycle 1 day 1 carboplatin /Taxol  10/14/2023 Cycle 1 day 8 carboplatin /Taxol  10/19/2023 Cycle 1 day 15 carboplatin /Taxol  10/26/2023 Cycle 1 day 22 carboplatin /Taxol  11/02/2023 Cycle 1 day 29 carboplatin /Taxol  11/09/2023 Cycle 1 day 36 carboplatin /Taxol  11/16/2023 Radiation completed 11/18/2023 01/09/2024 upper endoscopy: Esophagitis 33-40 cm from the incisors, altered texture at the GE junction biopsied-glandular atypia consistent with radiation atypia 01/11/2024 CT chest: Residual thickening of the distal esophagus/GE junction, stable small mediastinal lymph nodes, interstitial lung disease with superimposed areas of ground glass change in the right lung COPD 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  History of tobacco and alcohol  use, none at present 11.   Alcoholism 12.  Solid dysphagia and anterior chest pain secondary to #1 13.  Odynophagia secondary to radiation: Supra fate added 10/26/2023 14. Hypokalemia secondary to GI loss and diuretic    Disposition: [name] completed treatment of gastroesophageal cancer with concurrent chemotherapy/radiation in  October.  The restaging CT and endoscopic evaluations revealed no evidence of disease progression.  I reviewed the CT findings and images with [name].  No residual tumor was noted on the upper endoscopy.  She is in clinical remission.  She would like to have the feeding tube removed since she is not using the tube and has significant skin irritation at the skin exit.  I made a referral to interventional radiology for tube removal.  She is scheduled to see pulmonary medicine next week for follow-up of COPD.  She will return for an office visit in 3 months.  Arley Hof, MD  02/10/2024  11:11 AM   "

## 2024-02-13 ENCOUNTER — Encounter: Payer: Self-pay | Admitting: Adult Health

## 2024-02-13 ENCOUNTER — Ambulatory Visit: Admitting: Adult Health

## 2024-02-13 ENCOUNTER — Ambulatory Visit

## 2024-02-13 VITALS — BP 118/70 | HR 81 | Temp 97.8°F | Ht 62.5 in | Wt 181.8 lb

## 2024-02-13 DIAGNOSIS — Z931 Gastrostomy status: Secondary | ICD-10-CM

## 2024-02-13 DIAGNOSIS — J449 Chronic obstructive pulmonary disease, unspecified: Secondary | ICD-10-CM | POA: Diagnosis not present

## 2024-02-13 DIAGNOSIS — R1310 Dysphagia, unspecified: Secondary | ICD-10-CM

## 2024-02-13 DIAGNOSIS — R5381 Other malaise: Secondary | ICD-10-CM

## 2024-02-13 DIAGNOSIS — Z87891 Personal history of nicotine dependence: Secondary | ICD-10-CM

## 2024-02-13 DIAGNOSIS — J849 Interstitial pulmonary disease, unspecified: Secondary | ICD-10-CM

## 2024-02-13 DIAGNOSIS — C159 Malignant neoplasm of esophagus, unspecified: Secondary | ICD-10-CM

## 2024-02-13 DIAGNOSIS — Z01811 Encounter for preprocedural respiratory examination: Secondary | ICD-10-CM

## 2024-02-13 DIAGNOSIS — R0609 Other forms of dyspnea: Secondary | ICD-10-CM

## 2024-02-13 LAB — PULMONARY FUNCTION TEST
DL/VA % pred: 83 %
DL/VA: 3.46 ml/min/mmHg/L
DLCO unc % pred: 69 %
DLCO unc: 12.63 ml/min/mmHg
FEF 25-75 Post: 1.66 L/s
FEF 25-75 Pre: 1.52 L/s
FEF2575-%Change-Post: 9 %
FEF2575-%Pred-Post: 108 %
FEF2575-%Pred-Pre: 99 %
FEV1-%Change-Post: 1 %
FEV1-%Pred-Post: 94 %
FEV1-%Pred-Pre: 93 %
FEV1-Post: 1.83 L
FEV1-Pre: 1.8 L
FEV1FVC-%Change-Post: 5 %
FEV1FVC-%Pred-Pre: 103 %
FEV6-%Change-Post: -3 %
FEV6-%Pred-Post: 91 %
FEV6-%Pred-Pre: 94 %
FEV6-Post: 2.24 L
FEV6-Pre: 2.32 L
FEV6FVC-%Change-Post: 0 %
FEV6FVC-%Pred-Post: 105 %
FEV6FVC-%Pred-Pre: 104 %
FVC-%Change-Post: -3 %
FVC-%Pred-Post: 86 %
FVC-%Pred-Pre: 90 %
FVC-Post: 2.25 L
FVC-Pre: 2.34 L
Post FEV1/FVC ratio: 81 %
Post FEV6/FVC ratio: 100 %
Pre FEV1/FVC ratio: 77 %
Pre FEV6/FVC Ratio: 99 %
RV % pred: 111 %
RV: 2.5 L
TLC % pred: 97 %
TLC: 4.73 L

## 2024-02-13 MED ORDER — ALBUTEROL SULFATE HFA 108 (90 BASE) MCG/ACT IN AERS: 1.0000 | INHALATION_SPRAY | Freq: Four times a day (QID) | RESPIRATORY_TRACT | 2 refills | Status: AC | PRN

## 2024-02-13 NOTE — Patient Instructions (Addendum)
 Activity as tolerated  Albuterol  inhaler As needed   Aspiration precautions as discussed  Follow up with Dr. Geronimo in 6 months with PFT . (Spirometry with DLCO )

## 2024-02-13 NOTE — Progress Notes (Signed)
 Full pft performed today

## 2024-02-13 NOTE — Patient Instructions (Signed)
 Full pft performed today

## 2024-02-13 NOTE — Progress Notes (Signed)
 "  @Patient  ID: [name], female    DOB: Sep 14, 1945, 79 y.o.   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  Autoimmune labs essentially negative except for mildly positive ANA 1: 80, CCP negative   CT chest January 11, 2024 peripheral and basilar predominant subpleural reticular densities, coarse and ground glass and traction bronchiolectasis, new patchy ground glass in the right lung, 4 mm nodule along the minor fissure considered benign, residual thickening at the distal esophagus and GE junction compatible with history of esophageal carcinoma status post radiation no evidence of distant metastatic disease.  Discussed the use of AI scribe software for clinical note transcription with the patient, who gave verbal consent to proceed.  History of Present Illness [name] is a 79 year old female with COPD and pulmonary fibrosis who presents for a six-month follow-up.  Breathing is stable, but she experiences morning tightness, which she attributes to dust allergies.  Her exposure to dust is significant due to having 27 animals at home, including dogs and cats. Severe back  issues and scoliosis limit her mobility, confining her primarily to a wheelchair or bed.  Uses albuterol  on occasion.  No increased usage lately. Pulmonary function testing today appears stable.  FEV1 at 94%, ratio 81, FVC 86%, no significant bronchodilator response, DLCO stable at 69%.  She was diagnosed with esophageal cancer in August and underwent chemotherapy and 28 sessions of radiation therapy. A feeding tube was placed and has not yet been removed. Her last scan showed stable disease. She experiences intermittent swallowing difficulties, particularly with foods like rice and chicken, which can cause foamy regurgitation.  She has a complex surgical history with multiple back surgeries, resulting in significant metal instrumentation in her lumbar spine . Severe back pain limits her mobility, and she describes her back as 'like a noodle.' She is considering knee replacement surgery. She was suppose to have knee surgery last year.  Was postponed due to cancer diagnosis.    She is able to drive and drove herself to the appointment.     Allergies[1]  Immunization History  Administered Date(s) Administered   Fluzone  Influenza virus vaccine,trivalent (IIV3), split virus 10/03/2017, 11/08/2019   INFLUENZA, HIGH DOSE SEASONAL PF 12/14/2013, 11/27/2014, 11/04/2015, 11/09/2016, 11/09/2022, 10/26/2023   Influenza Split 02/09/2012   Influenza Whole 02/08/2009, 11/08/2009   Influenza-Unspecified 10/20/2012, 02/22/2013, 10/12/2016   PFIZER(Purple Top)SARS-COV-2 Vaccination 03/23/2019, 04/17/2019, 10/19/2019, 05/12/2020   PNEUMOCOCCAL CONJUGATE-20 08/26/2021   Pfizer Covid-19 Vaccine Bivalent Booster 54yrs & up 10/28/2020   Pfizer(Comirnaty )Fall Seasonal Vaccine 12 years and older 02/10/2024   Pneumococcal Conjugate-13 04/27/2013, 11/21/2015   Pneumococcal Polysaccharide-23 11/08/2008, 02/08/2009, 12/08/2016   Pneumococcal-Unspecified 10/09/2009   Td 10/13/2001, 07/09/2011   Tdap 02/17/2021    Zoster,  Live 12/16/2010    Past Medical History:  Diagnosis Date   Anemia    Anesthesia complication    woke up during back surgery at [facility] 2020   Anxiety    Bipolar affective disorder (HCC)    Cancer (HCC)    skin cancer   Chronic kidney disease    stage 3 kidney disease   COPD (chronic obstructive pulmonary disease) (HCC)    Cough    Depression    DJD (degenerative joint disease) of lumbar spine    DVT (deep venous thrombosis) (HCC) 2010   groin left - on coumadin for 6 months   Family history of anesthesia complication    Hx: father has nausea   Fibromyalgia    GERD (gastroesophageal reflux disease)    Left leg DVT (HCC) 2010   Lymphedema 2019   Migraine    Peripheral vascular disease    Pneumonia    PONV (postoperative nausea and vomiting)    and migraines   Pre-diabetes    Shortness of breath    exertion   Spinal cord stimulator dysfunction    has one in,but not working   Spinal headache    Vertigo    Hx: of   Vocal cord dysfunction    sees dr. shelah    Tobacco History: Tobacco Use History[2] Counseling given: Not Answered   Outpatient Medications Prior to Visit  Medication Sig Dispense Refill   ammonium lactate (LAC-HYDRIN) 12 % lotion Apply 1 Application topically 2 (two) times daily.     ARIPiprazole  (ABILIFY ) 30 MG tablet Take 30 mg by mouth at bedtime.  5   butalbital -acetaminophen -caffeine  (FIORICET , ESGIC ) 50-325-40 MG per tablet Take 2 tablets by mouth every 4 (four) hours as needed for headache or migraine.      cyclobenzaprine  (FLEXERIL ) 10 MG tablet Take 10 mg by mouth at bedtime.     cycloSPORINE  (RESTASIS ) 0.05 % ophthalmic emulsion Place 1 drop into both eyes 2 (two) times daily.     dexlansoprazole  (DEXILANT ) 60 MG capsule Take 1 capsule (60 mg total) by mouth daily. 30 capsule 0   ezetimibe  (ZETIA ) 10 MG tablet Take 10 mg by mouth at bedtime.     fenofibrate (TRICOR) 145 MG tablet Take 145 mg by mouth in the morning.      fluticasone  (FLONASE ) 50 MCG/ACT nasal spray Place 2 sprays into both nostrils 2 (two) times daily. 16 g 11   HYDROmorphone  (DILAUDID ) 2 MG tablet Take 2 mg by mouth in the morning, at noon, and at bedtime.     lamoTRIgine  (LAMICTAL ) 200 MG tablet Take 200 mg by mouth at bedtime.     lisdexamfetamine  (VYVANSE ) 50 MG capsule Take 50 mg by mouth daily after breakfast.     lubiprostone  (AMITIZA ) 24 MCG capsule Take 24 mcg by mouth every 12 (twelve) hours.     meclizine  (ANTIVERT ) 25 MG tablet Take 25 mg by mouth 3 (three) times daily as needed for dizziness.      Melatonin 12 MG TABS Take 12 mg by mouth at bedtime.     Polyethyl Glycol-Propyl Glycol (SYSTANE OP) Place 1 drop into both eyes 3 (three) times daily as needed (dry/irritated eyes.).     potassium chloride  20 MEQ/15ML (10%) SOLN Take 15 mLs (20 mEq total) by mouth 2 (two) times daily. Per tube 473 mL 0   pregabalin  (LYRICA ) 300 MG capsule Take 300 mg by mouth in the morning and at bedtime.     torsemide (DEMADEX) 20  MG tablet Take 20 mg by mouth 4 (four) times daily as needed (fluid retention.).     ALPRAZolam  (XANAX ) 1 MG tablet Take 1 mg by mouth See admin instructions. Take 1 tablet (1 mg) by mouth scheduled twice daily, may take an additional dose in the afternoon, if needed for anxiety. (Patient not taking: Reported on 02/13/2024)     chlorpheniramine (CHLOR-TRIMETON) 4 MG tablet Take 8 mg by mouth at bedtime. (Patient not taking: Reported on 02/13/2024)     diphenoxylate -atropine  (LOMOTIL ) 2.5-0.025 MG tablet Take 1-2 tablets by mouth 4 (four) times daily as needed for diarrhea or loose stools (maximum 8/day). (Patient not taking: Reported on 02/13/2024) 60 tablet 0   escitalopram (LEXAPRO) 5 MG/5ML solution Take 10 mg by mouth daily. (Patient not taking: Reported on 02/13/2024)     loperamide (IMODIUM A-D) 2 MG tablet Take 2-4 mg by mouth 4 (four) times daily as needed for diarrhea or loose stools. (Patient not taking: Reported on 02/13/2024)      ondansetron  (ZOFRAN ) 8 MG tablet Take 1 tablet (8 mg total) by mouth every 8 (eight) hours as needed. May start taking 72 hours after each chemotherapy treatment (Patient not taking: Reported on 02/13/2024) 30 tablet 1   prochlorperazine  (COMPAZINE ) 5 MG tablet Take 1 tablet (5 mg total) by mouth every 6 (six) hours as needed for nausea or vomiting. (Patient not taking: Reported on 02/13/2024) 90 tablet 1   sucralfate  (CARAFATE ) 1 GM/10ML suspension TAKE TWO TEASPOONFULS (10ML) BY MOUTH FOUR TIMES DAILY (Patient not taking: Reported on 02/13/2024) 1200 mL 0   No facility-administered medications prior to visit.     Review of Systems:   Constitutional:   No  weight loss, night sweats,  Fevers, chills, +fatigue, or  lassitude.  HEENT:   No headaches,  Difficulty swallowing,  Tooth/dental problems, or  Sore throat,                No sneezing, itching, ear ache, nasal congestion, post nasal drip,   CV:  No chest pain,  Orthopnea, PND, swelling in lower extremities, anasarca, dizziness, palpitations, syncope.   GI  No heartburn, indigestion, abdominal pain, nausea, vomiting, diarrhea, change in bowel habits, loss of appetite, bloody stools.   Resp: .  No chest wall deformity  Skin: no rash or lesions.  GU: no dysuria, change in color of urine, no urgency or frequency.  No flank pain, no hematuria   MS:  No joint pain or swelling.  No decreased range of motion.  No back pain.    Physical Exam  BP 118/70   Pulse 81   Temp 97.8 F (36.6 C)   Ht 5' 2.5 (1.588 m) Comment: Per pt  Wt 181 lb 12.8 oz (82.5 kg)   SpO2 96% Comment: RA  BMI 32.72 kg/m   GEN: A/Ox3; pleasant , NAD, wheelchair    HEENT:  [place]/AT,  NOSE-clear, THROAT-clear, no lesions, no postnasal drip or exudate noted.   NECK:  Supple w/ fair ROM; no JVD; normal carotid impulses w/o bruits; no thyromegaly or nodules palpated; no lymphadenopathy.    RESP  Clear  P & A; w/o, wheezes/ rales/ or rhonchi. no accessory muscle use,  no dullness to percussion  CARD:  RRR, no m/r/g, no peripheral edema, pulses intact, no cyanosis or clubbing.  GI:   Soft & nt; nml bowel sounds; no organomegaly or masses detected.   Musco: Warm bil, no deformities or joint swelling noted.   Neuro: alert, no  focal deficits noted.    Skin: Warm, no lesions or rashes    Lab Results:Reviewed 02/13/2024   CBC    Component Value Date/Time   WBC 6.8 12/07/2023 1341   WBC 3.5 (L) 11/01/2023 0917   RBC 4.10 12/07/2023 1341   HGB 12.3 12/07/2023 1341   HCT 36.4 12/07/2023 1341   PLT 277 12/07/2023 1341   MCV 88.8 12/07/2023 1341   MCH 30.0 12/07/2023 1341   MCHC 33.8 12/07/2023 1341   RDW 18.0 (H) 12/07/2023 1341   LYMPHSABS 0.3 (L) 12/07/2023 1341   MONOABS 0.9 12/07/2023 1341   EOSABS 0.1 12/07/2023 1341   BASOSABS 0.0 12/07/2023 1341    BMET    Component Value Date/Time   NA 138 12/07/2023 1341   K 3.5 12/07/2023 1341   CL 101 12/07/2023 1341   CO2 27 12/07/2023 1341   GLUCOSE 100 (H) 12/07/2023 1341   BUN 15 12/07/2023 1341   CREATININE 0.80 01/11/2024 1629   CREATININE 0.71 12/07/2023 1341   CALCIUM  9.1 12/07/2023 1341   GFRNONAA >60 12/07/2023 1341   GFRAA >60 09/06/2017 1347    BNP No results found for: BNP  ProBNP No results found for: PROBNP  Imaging: No results found.  Administration History     None          Latest Ref Rng & Units 02/13/2024   11:11 AM 06/23/2023    8:23 AM 04/13/2017    1:42 PM  PFT Results  FVC-Pre L 2.34  P 2.37  2.48   FVC-Predicted Pre % 90  P 91  88   FVC-Post L 2.25  P  2.32   FVC-Predicted Post % 86  P  82   Pre FEV1/FVC % % 77  P 76  76   Post FEV1/FCV % % 81  P  79   FEV1-Pre L 1.80  P 1.80  1.89   FEV1-Predicted Pre % 93  P 93  89   FEV1-Post L 1.83  P  1.83   DLCO uncorrected ml/min/mmHg 12.63  P  15.56   DLCO UNC% % 69  P  69   DLCO corrected ml/min/mmHg   17.40   DLCO COR %Predicted %   78   DLVA Predicted % 83  P  88   TLC L 4.73  P  4.70   TLC %  Predicted % 97  P  97   RV % Predicted % 111  P  92     P Preliminary result    No results found for: NITRICOXIDE      No data to display              Assessment & Plan:   Assessment and Plan Assessment & Plan Chronic obstructive pulmonary disease  -appears stable.  COPD appears stable. PFT are stable. Trigger prevention discussed.  Continue albuterol  inhaler as needed.   Interstitial lung disease -Probable UIP  Clinically appears stable. PFT are stable. Check spirometry with DLCO on follow-up visit  Continue current management and monitoring.  Esophageal cancer, post-treatment, with gastrostomy and dysphagia with aspiration risk   Post-treatment for esophageal cancer with chemotherapy and radiation, she has a gastrostomy tube in place with plans for removal. Intermittent Dysphagia presents with food stacking and aspiration risk. She should avoid rice and foods that may cause choking, maintain an upright position during meals, and avoid eating three hours before bedtime. Take small bites, chew thoroughly, avoid talking while eating, and swallow multiple times  after meals. Follow up with oncology and GI as planned   Preop risk assessment for Knee replacement surgery from pulmonary standpoint-patient is a low to medium risk from a pulmonary standpoint.  Pulmonary function testing shows no significant airflow obstruction or restriction.  Patient is not on oxygen.  She has multiple comorbidities with recently diagnosed esophageal cancer, extensive physical deconditioning with chronic back pain and mobility issues along with older age and polypharmacy.  Major Pulmonary risks identified in the multifactorial risk analysis are but not limited to a) pneumonia; b) recurrent intubation risk; c) prolonged or recurrent acute respiratory failure needing mechanical ventilation; d) prolonged hospitalization; e) DVT/Pulmonary embolism; f) Acute Pulmonary edema  Recommend 1. Short duration of  surgery as much as possible and avoid paralytic if possible 2. Recovery in step down or ICU with Pulmonary consultation if needed  3. DVT prophylaxis per protocol  4. Aggressive pulmonary toilet with o2, bronchodilatation, and incentive spirometry and early ambulation        Devlon Dosher, NP 02/13/2024  I spent  30  minutes dedicated to the care of this patient on the date of this encounter to include pre-visit review of records, face-to-face time with the patient discussing conditions above, post visit ordering of testing, clinical documentation with the electronic health record, making appropriate referrals as documented, and communicating necessary findings to members of the patients care team.      [1]  Allergies Allergen Reactions   Lisinopril Other (See Comments)    kidney failure   Lithium Other (See Comments)    Migraines and drunk   Statins Other (See Comments)    Very weak and achy   Adhesive [Tape] Rash    All tape, paper and steri-strips included   Codeine Nausea And Vomiting   Scopolamine  Other (See Comments)    Causes vertigo  [2]  Social History Tobacco Use  Smoking Status Former   Current packs/day: 0.00   Average packs/day: 2.5 packs/day for 21.0 years (52.5 ttl pk-yrs)   Types: Cigarettes   Start date: 09/15/1965   Quit date: 09/16/1986   Years since quitting: 37.4  Smokeless Tobacco Never   "

## 2024-02-18 ENCOUNTER — Other Ambulatory Visit: Payer: Self-pay

## 2024-02-20 ENCOUNTER — Inpatient Hospital Stay

## 2024-02-20 NOTE — Progress Notes (Signed)
 Nutrition Follow-up:  Patient with gastroesophageal cancer. Followed by Dr Cloretta.  Completed concurrent chemotherapy (10/8) and radiation (10/10).  PEG placed on 9/23 due to dysphagia, odynophagia.    Spoke with patient via phone.  Reports that she has not been using feeding tube for the last 2 1/2 weeks.  Scheduled to have it removed on 1/21.  Has been flushing it daily.  Reports that she is eating better and foods are going down better.  Has been ordering food from [employer] and has been enjoying the meals.  Feeling better   Labs: reviewed  Anthropometrics:   Weight 181 lb 12.8 oz on 1/5 176 lb 14.4 oz on 10/29 179 lb on 10/8 181 lb 4.8 oz on 10/1 179 lb on 9/29 184 lb on 9/24 187 lb on 9/15 193 lb on 9/10   Estimated Energy Needs  Kcals: 8199-7874 Protein: 85-102 g Fluid: >  NUTRITION DIAGNOSIS: Inadequate oral intake improved   INTERVENTION:  Continue flushing feeding tube with 60ml of water daily until removed Continue well balanced diet with lean protein and plant foods Patient has contact information and will cal if needed     NEXT VISIT: no follow-up  Ellarose Brandi B. Dasie SOLON, CSO, LDN Registered Dietitian [phone]

## 2024-02-29 ENCOUNTER — Ambulatory Visit (HOSPITAL_COMMUNITY)
Admission: RE | Admit: 2024-02-29 | Discharge: 2024-02-29 | Disposition: A | Source: Ambulatory Visit | Attending: Oncology | Admitting: Oncology

## 2024-02-29 DIAGNOSIS — Z431 Encounter for attention to gastrostomy: Secondary | ICD-10-CM | POA: Diagnosis present

## 2024-02-29 DIAGNOSIS — C159 Malignant neoplasm of esophagus, unspecified: Secondary | ICD-10-CM | POA: Diagnosis not present

## 2024-02-29 DIAGNOSIS — C160 Malignant neoplasm of cardia: Secondary | ICD-10-CM

## 2024-02-29 HISTORY — PX: IR GASTROSTOMY TUBE REMOVAL: IMG5492

## 2024-03-01 ENCOUNTER — Telehealth: Payer: Self-pay | Admitting: Oncology

## 2024-03-01 NOTE — Telephone Encounter (Signed)
 Scheduled appointment per 1/2 los. Called and left VM with appointment details for the patient.

## 2024-03-02 ENCOUNTER — Other Ambulatory Visit: Payer: Self-pay

## 2024-05-09 ENCOUNTER — Inpatient Hospital Stay: Admitting: Oncology
